# Patient Record
Sex: Female | Born: 1960 | Race: White | Hispanic: No | Smoking: Current every day smoker
Health system: Southern US, Community
[De-identification: ages and names within clinical notes are randomized; demographics above are authoritative.]

## PROBLEM LIST (undated history)

## (undated) DIAGNOSIS — Z9151 Personal history of suicidal behavior: Secondary | ICD-10-CM

## (undated) DIAGNOSIS — F319 Bipolar disorder, unspecified: Secondary | ICD-10-CM

## (undated) DIAGNOSIS — I10 Essential (primary) hypertension: Secondary | ICD-10-CM

## (undated) DIAGNOSIS — G893 Neoplasm related pain (acute) (chronic): Principal | ICD-10-CM

## (undated) DIAGNOSIS — T8859XA Other complications of anesthesia, initial encounter: Secondary | ICD-10-CM

## (undated) DIAGNOSIS — Z9889 Other specified postprocedural states: Secondary | ICD-10-CM

## (undated) DIAGNOSIS — F419 Anxiety disorder, unspecified: Secondary | ICD-10-CM

## (undated) DIAGNOSIS — T4145XA Adverse effect of unspecified anesthetic, initial encounter: Secondary | ICD-10-CM

## (undated) DIAGNOSIS — R413 Other amnesia: Secondary | ICD-10-CM

## (undated) DIAGNOSIS — Z9189 Other specified personal risk factors, not elsewhere classified: Secondary | ICD-10-CM

## (undated) DIAGNOSIS — C787 Secondary malignant neoplasm of liver and intrahepatic bile duct: Secondary | ICD-10-CM

## (undated) DIAGNOSIS — R112 Nausea with vomiting, unspecified: Secondary | ICD-10-CM

## (undated) DIAGNOSIS — N946 Dysmenorrhea, unspecified: Principal | ICD-10-CM

## (undated) DIAGNOSIS — E119 Type 2 diabetes mellitus without complications: Secondary | ICD-10-CM

## (undated) DIAGNOSIS — F329 Major depressive disorder, single episode, unspecified: Secondary | ICD-10-CM

## (undated) DIAGNOSIS — G894 Chronic pain syndrome: Secondary | ICD-10-CM

## (undated) DIAGNOSIS — F32A Depression, unspecified: Secondary | ICD-10-CM

## (undated) DIAGNOSIS — T451X5A Adverse effect of antineoplastic and immunosuppressive drugs, initial encounter: Secondary | ICD-10-CM

## (undated) DIAGNOSIS — C189 Malignant neoplasm of colon, unspecified: Secondary | ICD-10-CM

## (undated) DIAGNOSIS — D6959 Other secondary thrombocytopenia: Secondary | ICD-10-CM

## (undated) DIAGNOSIS — D701 Agranulocytosis secondary to cancer chemotherapy: Secondary | ICD-10-CM

## (undated) DIAGNOSIS — E1140 Type 2 diabetes mellitus with diabetic neuropathy, unspecified: Secondary | ICD-10-CM

## (undated) DIAGNOSIS — T50905A Adverse effect of unspecified drugs, medicaments and biological substances, initial encounter: Secondary | ICD-10-CM

## (undated) DIAGNOSIS — Z794 Long term (current) use of insulin: Secondary | ICD-10-CM

## (undated) DIAGNOSIS — K589 Irritable bowel syndrome without diarrhea: Secondary | ICD-10-CM

## (undated) DIAGNOSIS — Z915 Personal history of self-harm: Secondary | ICD-10-CM

## (undated) DIAGNOSIS — B1920 Unspecified viral hepatitis C without hepatic coma: Secondary | ICD-10-CM

## (undated) DIAGNOSIS — N1330 Unspecified hydronephrosis: Secondary | ICD-10-CM

## (undated) HISTORY — DX: Neoplasm related pain (acute) (chronic): G89.3

## (undated) HISTORY — DX: Malignant neoplasm of colon, unspecified: C18.9

## (undated) HISTORY — DX: Dysmenorrhea, unspecified: N94.6

## (undated) HISTORY — DX: Adverse effect of unspecified drugs, medicaments and biological substances, initial encounter: T50.905A

## (undated) HISTORY — DX: Adverse effect of antineoplastic and immunosuppressive drugs, initial encounter: T45.1X5A

## (undated) HISTORY — DX: Other secondary thrombocytopenia: D69.59

## (undated) HISTORY — DX: Agranulocytosis secondary to cancer chemotherapy: D70.1

## (undated) SURGERY — COLONOSCOPY
Anesthesia: Moderate Sedation

---

## 1998-01-10 ENCOUNTER — Emergency Department (HOSPITAL_COMMUNITY): Admission: EM | Admit: 1998-01-10 | Discharge: 1998-01-10 | Payer: Self-pay | Admitting: Emergency Medicine

## 1998-02-04 ENCOUNTER — Emergency Department (HOSPITAL_COMMUNITY): Admission: EM | Admit: 1998-02-04 | Discharge: 1998-02-04 | Payer: Self-pay | Admitting: Emergency Medicine

## 1999-03-16 ENCOUNTER — Encounter: Payer: Self-pay | Admitting: Rheumatology

## 1999-03-16 ENCOUNTER — Inpatient Hospital Stay: Admission: EM | Admit: 1999-03-16 | Discharge: 1999-03-17 | Payer: Self-pay | Admitting: *Deleted

## 1999-10-21 ENCOUNTER — Encounter: Payer: Self-pay | Admitting: Emergency Medicine

## 1999-10-21 ENCOUNTER — Inpatient Hospital Stay (HOSPITAL_COMMUNITY): Admission: EM | Admit: 1999-10-21 | Discharge: 1999-10-24 | Payer: Self-pay | Admitting: Emergency Medicine

## 1999-11-18 ENCOUNTER — Encounter: Admission: RE | Admit: 1999-11-18 | Discharge: 2000-02-16 | Payer: Self-pay | Admitting: Endocrinology

## 1999-11-21 ENCOUNTER — Encounter: Admission: RE | Admit: 1999-11-21 | Discharge: 1999-11-21 | Payer: Self-pay | Admitting: Obstetrics

## 1999-11-27 ENCOUNTER — Encounter: Admission: RE | Admit: 1999-11-27 | Discharge: 1999-11-27 | Payer: Self-pay | Admitting: Obstetrics & Gynecology

## 1999-12-18 ENCOUNTER — Encounter: Admission: RE | Admit: 1999-12-18 | Discharge: 1999-12-18 | Payer: Self-pay | Admitting: Obstetrics & Gynecology

## 1999-12-25 ENCOUNTER — Encounter: Admission: RE | Admit: 1999-12-25 | Discharge: 1999-12-25 | Payer: Self-pay | Admitting: Obstetrics & Gynecology

## 1999-12-27 ENCOUNTER — Inpatient Hospital Stay (HOSPITAL_COMMUNITY): Admission: AD | Admit: 1999-12-27 | Discharge: 1999-12-30 | Payer: Self-pay | Admitting: Internal Medicine

## 2000-01-03 ENCOUNTER — Encounter: Admission: RE | Admit: 2000-01-03 | Discharge: 2000-01-03 | Payer: Self-pay | Admitting: Family Medicine

## 2000-01-15 ENCOUNTER — Encounter: Admission: RE | Admit: 2000-01-15 | Discharge: 2000-01-15 | Payer: Self-pay | Admitting: Obstetrics

## 2000-01-17 ENCOUNTER — Ambulatory Visit (HOSPITAL_COMMUNITY): Admission: RE | Admit: 2000-01-17 | Discharge: 2000-01-17 | Payer: Self-pay | Admitting: Obstetrics & Gynecology

## 2000-03-25 ENCOUNTER — Encounter: Admission: RE | Admit: 2000-03-25 | Discharge: 2000-03-25 | Payer: Self-pay | Admitting: Obstetrics & Gynecology

## 2000-03-30 ENCOUNTER — Inpatient Hospital Stay (HOSPITAL_COMMUNITY): Admission: AD | Admit: 2000-03-30 | Discharge: 2000-04-01 | Payer: Self-pay | Admitting: Obstetrics & Gynecology

## 2000-03-31 ENCOUNTER — Encounter: Payer: Self-pay | Admitting: *Deleted

## 2000-04-08 ENCOUNTER — Encounter: Admission: RE | Admit: 2000-04-08 | Discharge: 2000-04-08 | Payer: Self-pay | Admitting: Obstetrics & Gynecology

## 2000-04-16 ENCOUNTER — Inpatient Hospital Stay (HOSPITAL_COMMUNITY): Admission: RE | Admit: 2000-04-16 | Discharge: 2000-04-16 | Payer: Self-pay | Admitting: Obstetrics

## 2000-04-20 ENCOUNTER — Encounter (HOSPITAL_COMMUNITY): Admission: RE | Admit: 2000-04-20 | Discharge: 2000-05-16 | Payer: Self-pay | Admitting: Obstetrics

## 2000-04-23 ENCOUNTER — Inpatient Hospital Stay (HOSPITAL_COMMUNITY): Admission: AD | Admit: 2000-04-23 | Discharge: 2000-04-23 | Payer: Self-pay | Admitting: *Deleted

## 2000-04-29 ENCOUNTER — Inpatient Hospital Stay (HOSPITAL_COMMUNITY): Admission: AD | Admit: 2000-04-29 | Discharge: 2000-05-18 | Payer: Self-pay | Admitting: Obstetrics

## 2000-04-29 ENCOUNTER — Encounter (INDEPENDENT_AMBULATORY_CARE_PROVIDER_SITE_OTHER): Payer: Self-pay

## 2000-04-30 ENCOUNTER — Encounter: Payer: Self-pay | Admitting: Obstetrics

## 2000-05-04 ENCOUNTER — Encounter: Payer: Self-pay | Admitting: Obstetrics

## 2000-05-05 ENCOUNTER — Encounter: Payer: Self-pay | Admitting: *Deleted

## 2000-05-11 ENCOUNTER — Encounter: Payer: Self-pay | Admitting: *Deleted

## 2000-05-15 ENCOUNTER — Encounter: Payer: Self-pay | Admitting: Obstetrics & Gynecology

## 2000-05-22 ENCOUNTER — Inpatient Hospital Stay (HOSPITAL_COMMUNITY): Admission: AD | Admit: 2000-05-22 | Discharge: 2000-05-22 | Payer: Self-pay | Admitting: Obstetrics & Gynecology

## 2000-07-27 ENCOUNTER — Encounter: Payer: Self-pay | Admitting: Emergency Medicine

## 2000-07-27 ENCOUNTER — Emergency Department (HOSPITAL_COMMUNITY): Admission: EM | Admit: 2000-07-27 | Discharge: 2000-07-27 | Payer: Self-pay | Admitting: Emergency Medicine

## 2000-07-28 ENCOUNTER — Emergency Department (HOSPITAL_COMMUNITY): Admission: EM | Admit: 2000-07-28 | Discharge: 2000-07-28 | Payer: Self-pay | Admitting: Emergency Medicine

## 2000-07-29 ENCOUNTER — Emergency Department (HOSPITAL_COMMUNITY): Admission: EM | Admit: 2000-07-29 | Discharge: 2000-07-29 | Payer: Self-pay

## 2001-02-11 ENCOUNTER — Emergency Department (HOSPITAL_COMMUNITY): Admission: EM | Admit: 2001-02-11 | Discharge: 2001-02-11 | Payer: Self-pay | Admitting: Internal Medicine

## 2001-02-13 ENCOUNTER — Emergency Department (HOSPITAL_COMMUNITY): Admission: EM | Admit: 2001-02-13 | Discharge: 2001-02-13 | Payer: Self-pay

## 2001-07-07 ENCOUNTER — Inpatient Hospital Stay (HOSPITAL_COMMUNITY): Admission: EM | Admit: 2001-07-07 | Discharge: 2001-07-09 | Payer: Self-pay | Admitting: Emergency Medicine

## 2001-07-15 ENCOUNTER — Encounter (HOSPITAL_COMMUNITY): Admission: RE | Admit: 2001-07-15 | Discharge: 2001-10-13 | Payer: Self-pay | Admitting: *Deleted

## 2001-09-12 ENCOUNTER — Emergency Department (HOSPITAL_COMMUNITY): Admission: EM | Admit: 2001-09-12 | Discharge: 2001-09-12 | Payer: Self-pay | Admitting: Emergency Medicine

## 2002-12-12 ENCOUNTER — Emergency Department (HOSPITAL_COMMUNITY): Admission: EM | Admit: 2002-12-12 | Discharge: 2002-12-13 | Payer: Self-pay | Admitting: Emergency Medicine

## 2003-05-12 ENCOUNTER — Encounter: Payer: Self-pay | Admitting: Internal Medicine

## 2003-05-12 ENCOUNTER — Emergency Department (HOSPITAL_COMMUNITY): Admission: EM | Admit: 2003-05-12 | Discharge: 2003-05-12 | Payer: Self-pay | Admitting: Internal Medicine

## 2003-06-03 ENCOUNTER — Emergency Department (HOSPITAL_COMMUNITY): Admission: EM | Admit: 2003-06-03 | Discharge: 2003-06-03 | Payer: Self-pay | Admitting: Emergency Medicine

## 2003-06-04 ENCOUNTER — Emergency Department (HOSPITAL_COMMUNITY): Admission: EM | Admit: 2003-06-04 | Discharge: 2003-06-04 | Payer: Self-pay | Admitting: Emergency Medicine

## 2003-06-08 ENCOUNTER — Emergency Department (HOSPITAL_COMMUNITY): Admission: EM | Admit: 2003-06-08 | Discharge: 2003-06-08 | Payer: Self-pay | Admitting: Emergency Medicine

## 2004-01-20 ENCOUNTER — Emergency Department (HOSPITAL_COMMUNITY): Admission: EM | Admit: 2004-01-20 | Discharge: 2004-01-20 | Payer: Self-pay | Admitting: Diagnostic Radiology

## 2004-02-01 ENCOUNTER — Encounter: Admission: RE | Admit: 2004-02-01 | Discharge: 2004-02-01 | Payer: Self-pay | Admitting: Internal Medicine

## 2004-02-08 ENCOUNTER — Inpatient Hospital Stay (HOSPITAL_COMMUNITY): Admission: EM | Admit: 2004-02-08 | Discharge: 2004-02-09 | Payer: Self-pay | Admitting: Emergency Medicine

## 2004-03-15 ENCOUNTER — Ambulatory Visit (HOSPITAL_COMMUNITY): Admission: RE | Admit: 2004-03-15 | Discharge: 2004-03-15 | Payer: Self-pay | Admitting: Internal Medicine

## 2004-04-18 ENCOUNTER — Ambulatory Visit: Payer: Self-pay | Admitting: Family Medicine

## 2004-05-16 ENCOUNTER — Ambulatory Visit: Payer: Self-pay | Admitting: Family Medicine

## 2004-06-17 ENCOUNTER — Emergency Department (HOSPITAL_COMMUNITY): Admission: EM | Admit: 2004-06-17 | Discharge: 2004-06-17 | Payer: Self-pay | Admitting: Emergency Medicine

## 2004-07-09 ENCOUNTER — Ambulatory Visit: Payer: Self-pay | Admitting: Family Medicine

## 2004-09-03 ENCOUNTER — Ambulatory Visit: Payer: Self-pay | Admitting: Family Medicine

## 2004-09-10 ENCOUNTER — Ambulatory Visit: Payer: Self-pay | Admitting: Family Medicine

## 2004-10-08 ENCOUNTER — Ambulatory Visit: Payer: Self-pay | Admitting: Family Medicine

## 2005-02-11 ENCOUNTER — Ambulatory Visit: Payer: Self-pay | Admitting: Family Medicine

## 2005-04-20 ENCOUNTER — Emergency Department (HOSPITAL_COMMUNITY): Admission: EM | Admit: 2005-04-20 | Discharge: 2005-04-20 | Payer: Self-pay | Admitting: Emergency Medicine

## 2005-04-29 ENCOUNTER — Ambulatory Visit: Payer: Self-pay | Admitting: Family Medicine

## 2005-06-24 ENCOUNTER — Ambulatory Visit: Payer: Self-pay | Admitting: Family Medicine

## 2005-07-04 ENCOUNTER — Emergency Department (HOSPITAL_COMMUNITY): Admission: EM | Admit: 2005-07-04 | Discharge: 2005-07-04 | Payer: Self-pay | Admitting: Emergency Medicine

## 2005-08-25 ENCOUNTER — Ambulatory Visit: Payer: Self-pay | Admitting: Family Medicine

## 2005-09-03 ENCOUNTER — Inpatient Hospital Stay (HOSPITAL_COMMUNITY): Admission: EM | Admit: 2005-09-03 | Discharge: 2005-09-12 | Payer: Self-pay | Admitting: Psychiatry

## 2005-09-04 ENCOUNTER — Ambulatory Visit: Payer: Self-pay | Admitting: Psychiatry

## 2005-09-21 ENCOUNTER — Emergency Department (HOSPITAL_COMMUNITY): Admission: EM | Admit: 2005-09-21 | Discharge: 2005-09-21 | Payer: Self-pay | Admitting: Emergency Medicine

## 2005-09-23 ENCOUNTER — Ambulatory Visit: Payer: Self-pay | Admitting: Family Medicine

## 2005-09-23 ENCOUNTER — Inpatient Hospital Stay (HOSPITAL_COMMUNITY): Admission: EM | Admit: 2005-09-23 | Discharge: 2005-09-25 | Payer: Self-pay | Admitting: *Deleted

## 2005-09-25 ENCOUNTER — Inpatient Hospital Stay (HOSPITAL_COMMUNITY): Admission: RE | Admit: 2005-09-25 | Discharge: 2005-10-02 | Payer: Self-pay | Admitting: Psychiatry

## 2005-10-03 ENCOUNTER — Ambulatory Visit: Payer: Self-pay | Admitting: Family Medicine

## 2005-10-08 ENCOUNTER — Ambulatory Visit (HOSPITAL_COMMUNITY): Admission: RE | Admit: 2005-10-08 | Discharge: 2005-10-08 | Payer: Self-pay | Admitting: *Deleted

## 2005-10-08 ENCOUNTER — Encounter: Payer: Self-pay | Admitting: Cardiology

## 2005-10-08 ENCOUNTER — Ambulatory Visit: Payer: Self-pay | Admitting: Cardiology

## 2005-10-17 ENCOUNTER — Emergency Department (HOSPITAL_COMMUNITY): Admission: EM | Admit: 2005-10-17 | Discharge: 2005-10-17 | Payer: Self-pay | Admitting: Emergency Medicine

## 2005-10-21 ENCOUNTER — Ambulatory Visit: Payer: Self-pay | Admitting: Family Medicine

## 2005-12-09 ENCOUNTER — Ambulatory Visit: Payer: Self-pay | Admitting: Family Medicine

## 2005-12-19 ENCOUNTER — Emergency Department (HOSPITAL_COMMUNITY): Admission: EM | Admit: 2005-12-19 | Discharge: 2005-12-19 | Payer: Self-pay | Admitting: Emergency Medicine

## 2005-12-23 ENCOUNTER — Ambulatory Visit: Payer: Self-pay | Admitting: Family Medicine

## 2006-01-08 ENCOUNTER — Ambulatory Visit: Payer: Self-pay | Admitting: Internal Medicine

## 2007-07-31 ENCOUNTER — Emergency Department (HOSPITAL_COMMUNITY): Admission: EM | Admit: 2007-07-31 | Discharge: 2007-07-31 | Payer: Self-pay | Admitting: Emergency Medicine

## 2007-08-23 ENCOUNTER — Inpatient Hospital Stay (HOSPITAL_COMMUNITY): Admission: RE | Admit: 2007-08-23 | Discharge: 2007-08-24 | Payer: Self-pay | Admitting: Psychiatry

## 2007-08-23 ENCOUNTER — Ambulatory Visit: Payer: Self-pay | Admitting: Psychiatry

## 2007-09-15 ENCOUNTER — Emergency Department (HOSPITAL_COMMUNITY): Admission: EM | Admit: 2007-09-15 | Discharge: 2007-09-15 | Payer: Self-pay | Admitting: Emergency Medicine

## 2007-09-19 ENCOUNTER — Emergency Department (HOSPITAL_COMMUNITY): Admission: EM | Admit: 2007-09-19 | Discharge: 2007-09-19 | Payer: Self-pay | Admitting: Emergency Medicine

## 2007-11-07 ENCOUNTER — Emergency Department (HOSPITAL_COMMUNITY): Admission: EM | Admit: 2007-11-07 | Discharge: 2007-11-07 | Payer: Self-pay | Admitting: Emergency Medicine

## 2007-11-25 ENCOUNTER — Emergency Department (HOSPITAL_COMMUNITY): Admission: EM | Admit: 2007-11-25 | Discharge: 2007-11-25 | Payer: Self-pay | Admitting: Emergency Medicine

## 2007-12-06 ENCOUNTER — Ambulatory Visit: Payer: Self-pay | Admitting: Internal Medicine

## 2007-12-06 ENCOUNTER — Encounter (INDEPENDENT_AMBULATORY_CARE_PROVIDER_SITE_OTHER): Payer: Self-pay | Admitting: Family Medicine

## 2007-12-06 LAB — CONVERTED CEMR LAB
ALT: 16 units/L (ref 0–35)
Albumin: 4.3 g/dL (ref 3.5–5.2)
Alkaline Phosphatase: 57 units/L (ref 39–117)
Amphetamine Screen, Ur: NEGATIVE
BUN: 10 mg/dL (ref 6–23)
Basophils Absolute: 0 10*3/uL (ref 0.0–0.1)
Benzodiazepines.: NEGATIVE
CO2: 28 meq/L (ref 19–32)
Chloride: 103 meq/L (ref 96–112)
Cholesterol: 172 mg/dL (ref 0–200)
Creatinine, Ser: 1.05 mg/dL (ref 0.40–1.20)
Eosinophils Absolute: 0.1 10*3/uL (ref 0.0–0.7)
Eosinophils Relative: 2 % (ref 0–5)
HCT: 37.7 % (ref 36.0–46.0)
Lymphocytes Relative: 39 % (ref 12–46)
MCV: 81.6 fL (ref 78.0–100.0)
Microalb, Ur: 0.2 mg/dL (ref 0.00–1.89)
Monocytes Absolute: 0.4 10*3/uL (ref 0.1–1.0)
Monocytes Relative: 9 % (ref 3–12)
Opiate Screen, Urine: NEGATIVE
Platelets: 183 10*3/uL (ref 150–400)
Potassium: 3.4 meq/L — ABNORMAL LOW (ref 3.5–5.3)
Propoxyphene: NEGATIVE
RBC: 4.62 M/uL (ref 3.87–5.11)
RDW: 14.7 % (ref 11.5–15.5)
Sodium: 143 meq/L (ref 135–145)
TSH: 1.524 microintl units/mL (ref 0.350–5.50)
Total Protein: 7 g/dL (ref 6.0–8.3)
Triglycerides: 79 mg/dL (ref <150)

## 2008-01-24 ENCOUNTER — Inpatient Hospital Stay (HOSPITAL_COMMUNITY): Admission: AD | Admit: 2008-01-24 | Discharge: 2008-01-28 | Payer: Self-pay | Admitting: Psychiatry

## 2008-01-24 ENCOUNTER — Ambulatory Visit: Payer: Self-pay | Admitting: Psychiatry

## 2008-01-24 ENCOUNTER — Encounter: Payer: Self-pay | Admitting: Emergency Medicine

## 2008-02-21 ENCOUNTER — Ambulatory Visit: Payer: Self-pay | Admitting: Internal Medicine

## 2008-03-09 ENCOUNTER — Telehealth (INDEPENDENT_AMBULATORY_CARE_PROVIDER_SITE_OTHER): Payer: Self-pay | Admitting: Nurse Practitioner

## 2008-03-14 ENCOUNTER — Inpatient Hospital Stay (HOSPITAL_COMMUNITY): Admission: EM | Admit: 2008-03-14 | Discharge: 2008-03-16 | Payer: Self-pay | Admitting: Emergency Medicine

## 2008-03-14 DIAGNOSIS — F1120 Opioid dependence, uncomplicated: Secondary | ICD-10-CM | POA: Insufficient documentation

## 2008-04-04 ENCOUNTER — Ambulatory Visit: Payer: Self-pay | Admitting: Nurse Practitioner

## 2008-04-04 DIAGNOSIS — B1710 Acute hepatitis C without hepatic coma: Secondary | ICD-10-CM

## 2008-04-04 DIAGNOSIS — B009 Herpesviral infection, unspecified: Secondary | ICD-10-CM | POA: Insufficient documentation

## 2008-04-04 DIAGNOSIS — F319 Bipolar disorder, unspecified: Secondary | ICD-10-CM

## 2008-04-04 DIAGNOSIS — F1910 Other psychoactive substance abuse, uncomplicated: Secondary | ICD-10-CM

## 2008-04-04 DIAGNOSIS — D539 Nutritional anemia, unspecified: Secondary | ICD-10-CM | POA: Insufficient documentation

## 2008-04-04 DIAGNOSIS — R300 Dysuria: Secondary | ICD-10-CM

## 2008-04-04 DIAGNOSIS — I10 Essential (primary) hypertension: Secondary | ICD-10-CM

## 2008-04-04 DIAGNOSIS — F39 Unspecified mood [affective] disorder: Secondary | ICD-10-CM | POA: Insufficient documentation

## 2008-04-04 DIAGNOSIS — F17200 Nicotine dependence, unspecified, uncomplicated: Secondary | ICD-10-CM

## 2008-04-04 LAB — CONVERTED CEMR LAB
Bilirubin Urine: NEGATIVE
Blood Glucose, Fingerstick: 203
Creatinine,U: 112.8 mg/dL
Glucose, Urine, Semiquant: NEGATIVE
Propoxyphene: NEGATIVE
Protein, U semiquant: NEGATIVE
Urobilinogen, UA: 0.2

## 2008-04-05 ENCOUNTER — Encounter (INDEPENDENT_AMBULATORY_CARE_PROVIDER_SITE_OTHER): Payer: Self-pay | Admitting: Nurse Practitioner

## 2008-04-05 LAB — CONVERTED CEMR LAB
BUN: 18 mg/dL (ref 6–23)
Basophils Absolute: 0 10*3/uL (ref 0.0–0.1)
CO2: 24 meq/L (ref 19–32)
Calcium: 8.8 mg/dL (ref 8.4–10.5)
Chloride: 105 meq/L (ref 96–112)
Eosinophils Absolute: 0 10*3/uL (ref 0.0–0.7)
Glucose, Bld: 187 mg/dL — ABNORMAL HIGH (ref 70–99)
HCT: 40.4 % (ref 36.0–46.0)
HDL: 80 mg/dL (ref >39)
Hemoglobin: 13 g/dL (ref 12.0–15.0)
Lymphs Abs: 1.5 10*3/uL (ref 0.7–4.0)
MCHC: 32.2 g/dL (ref 30.0–36.0)
MCV: 85.1 fL (ref 78.0–100.0)
Microalb, Ur: 1.27 mg/dL (ref 0.00–1.89)
Monocytes Absolute: 0.3 10*3/uL (ref 0.1–1.0)
Monocytes Relative: 4 % (ref 3–12)
Neutro Abs: 3.9 10*3/uL (ref 1.7–7.7)
Neutrophils Relative %: 68 % (ref 43–77)
Platelets: 200 10*3/uL (ref 150–400)
Potassium: 4.3 meq/L (ref 3.5–5.3)
Sodium: 139 meq/L (ref 135–145)
Total Bilirubin: 0.3 mg/dL (ref 0.3–1.2)
WBC: 5.7 10*3/uL (ref 4.0–10.5)

## 2008-04-06 ENCOUNTER — Telehealth (INDEPENDENT_AMBULATORY_CARE_PROVIDER_SITE_OTHER): Payer: Self-pay | Admitting: Nurse Practitioner

## 2008-04-07 DIAGNOSIS — F489 Nonpsychotic mental disorder, unspecified: Secondary | ICD-10-CM | POA: Insufficient documentation

## 2008-04-07 DIAGNOSIS — E109 Type 1 diabetes mellitus without complications: Secondary | ICD-10-CM

## 2008-05-24 ENCOUNTER — Telehealth (INDEPENDENT_AMBULATORY_CARE_PROVIDER_SITE_OTHER): Payer: Self-pay | Admitting: Nurse Practitioner

## 2008-06-14 ENCOUNTER — Ambulatory Visit: Payer: Self-pay | Admitting: Nurse Practitioner

## 2008-06-14 DIAGNOSIS — L98499 Non-pressure chronic ulcer of skin of other sites with unspecified severity: Secondary | ICD-10-CM | POA: Insufficient documentation

## 2008-06-14 DIAGNOSIS — J069 Acute upper respiratory infection, unspecified: Secondary | ICD-10-CM | POA: Insufficient documentation

## 2008-06-14 LAB — CONVERTED CEMR LAB: Hgb A1c MFr Bld: 7.3 %

## 2008-06-15 ENCOUNTER — Telehealth (INDEPENDENT_AMBULATORY_CARE_PROVIDER_SITE_OTHER): Payer: Self-pay | Admitting: Nurse Practitioner

## 2008-06-16 ENCOUNTER — Emergency Department (HOSPITAL_COMMUNITY): Admission: EM | Admit: 2008-06-16 | Discharge: 2008-06-16 | Payer: Self-pay | Admitting: Emergency Medicine

## 2008-06-23 ENCOUNTER — Encounter (INDEPENDENT_AMBULATORY_CARE_PROVIDER_SITE_OTHER): Payer: Self-pay | Admitting: Nurse Practitioner

## 2008-07-03 ENCOUNTER — Telehealth (INDEPENDENT_AMBULATORY_CARE_PROVIDER_SITE_OTHER): Payer: Self-pay | Admitting: Nurse Practitioner

## 2008-07-10 ENCOUNTER — Encounter (INDEPENDENT_AMBULATORY_CARE_PROVIDER_SITE_OTHER): Payer: Self-pay | Admitting: Nurse Practitioner

## 2008-07-14 ENCOUNTER — Encounter (INDEPENDENT_AMBULATORY_CARE_PROVIDER_SITE_OTHER): Payer: Self-pay | Admitting: Nurse Practitioner

## 2008-08-15 ENCOUNTER — Ambulatory Visit: Payer: Self-pay | Admitting: Nurse Practitioner

## 2008-08-15 DIAGNOSIS — R609 Edema, unspecified: Secondary | ICD-10-CM | POA: Insufficient documentation

## 2008-08-15 LAB — CONVERTED CEMR LAB: Blood Glucose, Fingerstick: 42

## 2008-08-16 LAB — CONVERTED CEMR LAB: Glucose, Bld: 130 mg/dL — ABNORMAL HIGH (ref 70–99)

## 2008-08-23 ENCOUNTER — Encounter (INDEPENDENT_AMBULATORY_CARE_PROVIDER_SITE_OTHER): Payer: Self-pay | Admitting: Nurse Practitioner

## 2008-10-11 ENCOUNTER — Ambulatory Visit: Payer: Self-pay | Admitting: Nurse Practitioner

## 2008-10-11 DIAGNOSIS — M25579 Pain in unspecified ankle and joints of unspecified foot: Secondary | ICD-10-CM

## 2008-10-11 DIAGNOSIS — M25569 Pain in unspecified knee: Secondary | ICD-10-CM

## 2008-10-11 LAB — CONVERTED CEMR LAB: Hgb A1c MFr Bld: 6.2 %

## 2008-10-17 ENCOUNTER — Telehealth (INDEPENDENT_AMBULATORY_CARE_PROVIDER_SITE_OTHER): Payer: Self-pay | Admitting: Nurse Practitioner

## 2008-11-08 ENCOUNTER — Telehealth (INDEPENDENT_AMBULATORY_CARE_PROVIDER_SITE_OTHER): Payer: Self-pay | Admitting: Nurse Practitioner

## 2008-11-13 ENCOUNTER — Ambulatory Visit: Payer: Self-pay | Admitting: Nurse Practitioner

## 2008-11-13 LAB — CONVERTED CEMR LAB: Blood Glucose, Fingerstick: 242

## 2008-11-14 ENCOUNTER — Ambulatory Visit: Payer: Self-pay | Admitting: Nurse Practitioner

## 2008-11-14 LAB — CONVERTED CEMR LAB
HCT: 39.7 % (ref 36.0–46.0)
HCV Quantitative: 12700000 intl units/mL — ABNORMAL HIGH (ref <43)
Lymphocytes Relative: 23 % (ref 12–46)
MCHC: 33.5 g/dL (ref 30.0–36.0)
MCV: 88.2 fL (ref 78.0–100.0)
Monocytes Absolute: 0.5 10*3/uL (ref 0.1–1.0)
Monocytes Relative: 9 % (ref 3–12)
Neutrophils Relative %: 67 % (ref 43–77)

## 2008-11-16 ENCOUNTER — Telehealth (INDEPENDENT_AMBULATORY_CARE_PROVIDER_SITE_OTHER): Payer: Self-pay | Admitting: Nurse Practitioner

## 2008-11-21 ENCOUNTER — Ambulatory Visit (HOSPITAL_COMMUNITY): Admission: RE | Admit: 2008-11-21 | Discharge: 2008-11-21 | Payer: Self-pay | Admitting: Nurse Practitioner

## 2008-11-24 ENCOUNTER — Encounter (INDEPENDENT_AMBULATORY_CARE_PROVIDER_SITE_OTHER): Payer: Self-pay | Admitting: Nurse Practitioner

## 2008-12-05 ENCOUNTER — Encounter (INDEPENDENT_AMBULATORY_CARE_PROVIDER_SITE_OTHER): Payer: Self-pay | Admitting: Nurse Practitioner

## 2008-12-07 ENCOUNTER — Ambulatory Visit: Payer: Self-pay | Admitting: Gastroenterology

## 2008-12-29 ENCOUNTER — Ambulatory Visit: Payer: Self-pay | Admitting: Nurse Practitioner

## 2008-12-29 DIAGNOSIS — R0602 Shortness of breath: Secondary | ICD-10-CM

## 2009-01-10 ENCOUNTER — Encounter (INDEPENDENT_AMBULATORY_CARE_PROVIDER_SITE_OTHER): Payer: Self-pay | Admitting: Ophthalmology

## 2009-01-10 ENCOUNTER — Ambulatory Visit (HOSPITAL_COMMUNITY): Admission: RE | Admit: 2009-01-10 | Discharge: 2009-01-10 | Payer: Self-pay | Admitting: Family Medicine

## 2009-01-10 ENCOUNTER — Ambulatory Visit: Payer: Self-pay | Admitting: Internal Medicine

## 2009-02-25 ENCOUNTER — Telehealth (INDEPENDENT_AMBULATORY_CARE_PROVIDER_SITE_OTHER): Payer: Self-pay | Admitting: Nurse Practitioner

## 2009-02-26 ENCOUNTER — Ambulatory Visit: Payer: Self-pay | Admitting: Nurse Practitioner

## 2009-02-26 LAB — CONVERTED CEMR LAB
Bilirubin Urine: NEGATIVE
Blood Glucose, Fingerstick: 116
Hgb A1c MFr Bld: 6.1 %
Ketones, urine, test strip: NEGATIVE
Nitrite: NEGATIVE
Protein, U semiquant: NEGATIVE
WBC Urine, dipstick: NEGATIVE

## 2009-02-27 LAB — CONVERTED CEMR LAB
ALT: 18 units/L (ref 0–35)
AST: 22 units/L (ref 0–37)
Albumin: 4.2 g/dL (ref 3.5–5.2)
Alkaline Phosphatase: 79 units/L (ref 39–117)
BUN: 52 mg/dL — ABNORMAL HIGH (ref 6–23)
CO2: 30 meq/L (ref 19–32)
Calcium: 9.8 mg/dL (ref 8.4–10.5)
Chloride: 93 meq/L — ABNORMAL LOW (ref 96–112)
Creatinine, Ser: 2.2 mg/dL — ABNORMAL HIGH (ref 0.40–1.20)
Glucose, Bld: 66 mg/dL — ABNORMAL LOW (ref 70–99)
Potassium: 4.8 meq/L (ref 3.5–5.3)
Sodium: 139 meq/L (ref 135–145)
Total Bilirubin: 0.4 mg/dL (ref 0.3–1.2)
Total Protein: 7.1 g/dL (ref 6.0–8.3)

## 2009-03-02 ENCOUNTER — Telehealth (INDEPENDENT_AMBULATORY_CARE_PROVIDER_SITE_OTHER): Payer: Self-pay | Admitting: Nurse Practitioner

## 2009-03-30 ENCOUNTER — Ambulatory Visit: Payer: Self-pay | Admitting: Nurse Practitioner

## 2009-03-30 DIAGNOSIS — N183 Chronic kidney disease, stage 3 (moderate): Secondary | ICD-10-CM

## 2009-03-30 DIAGNOSIS — L989 Disorder of the skin and subcutaneous tissue, unspecified: Secondary | ICD-10-CM | POA: Insufficient documentation

## 2009-04-10 ENCOUNTER — Ambulatory Visit: Payer: Self-pay | Admitting: Gastroenterology

## 2009-04-10 ENCOUNTER — Encounter (INDEPENDENT_AMBULATORY_CARE_PROVIDER_SITE_OTHER): Payer: Self-pay | Admitting: Nurse Practitioner

## 2009-04-19 ENCOUNTER — Ambulatory Visit (HOSPITAL_COMMUNITY): Admission: RE | Admit: 2009-04-19 | Discharge: 2009-04-19 | Payer: Self-pay | Admitting: Nephrology

## 2009-04-21 ENCOUNTER — Encounter (INDEPENDENT_AMBULATORY_CARE_PROVIDER_SITE_OTHER): Payer: Self-pay | Admitting: Nurse Practitioner

## 2009-04-23 ENCOUNTER — Encounter: Admission: RE | Admit: 2009-04-23 | Discharge: 2009-04-23 | Payer: Self-pay | Admitting: Nephrology

## 2009-04-24 ENCOUNTER — Encounter (INDEPENDENT_AMBULATORY_CARE_PROVIDER_SITE_OTHER): Payer: Self-pay | Admitting: Nurse Practitioner

## 2009-05-08 ENCOUNTER — Encounter (INDEPENDENT_AMBULATORY_CARE_PROVIDER_SITE_OTHER): Payer: Self-pay | Admitting: Nurse Practitioner

## 2009-05-14 ENCOUNTER — Ambulatory Visit: Payer: Self-pay | Admitting: Nurse Practitioner

## 2009-05-14 LAB — CONVERTED CEMR LAB: Blood Glucose, Fingerstick: 253

## 2009-05-18 ENCOUNTER — Observation Stay (HOSPITAL_COMMUNITY): Admission: EM | Admit: 2009-05-18 | Discharge: 2009-05-18 | Payer: Self-pay | Admitting: Emergency Medicine

## 2009-06-11 ENCOUNTER — Encounter (INDEPENDENT_AMBULATORY_CARE_PROVIDER_SITE_OTHER): Payer: Self-pay | Admitting: Nurse Practitioner

## 2009-06-11 ENCOUNTER — Telehealth (INDEPENDENT_AMBULATORY_CARE_PROVIDER_SITE_OTHER): Payer: Self-pay | Admitting: Nurse Practitioner

## 2009-06-21 ENCOUNTER — Ambulatory Visit (HOSPITAL_COMMUNITY): Admission: RE | Admit: 2009-06-21 | Discharge: 2009-06-21 | Payer: Self-pay | Admitting: Gastroenterology

## 2009-06-22 ENCOUNTER — Ambulatory Visit: Payer: Self-pay | Admitting: Nurse Practitioner

## 2009-06-22 DIAGNOSIS — E663 Overweight: Secondary | ICD-10-CM

## 2009-07-11 ENCOUNTER — Telehealth (INDEPENDENT_AMBULATORY_CARE_PROVIDER_SITE_OTHER): Payer: Self-pay | Admitting: Nurse Practitioner

## 2009-08-06 ENCOUNTER — Ambulatory Visit: Payer: Self-pay | Admitting: Nurse Practitioner

## 2009-08-06 LAB — CONVERTED CEMR LAB: Blood Glucose, Fingerstick: 109

## 2009-09-18 ENCOUNTER — Ambulatory Visit: Payer: Self-pay | Admitting: Nurse Practitioner

## 2009-09-24 LAB — CONVERTED CEMR LAB
AST: 24 units/L (ref 0–37)
BUN: 12 mg/dL (ref 6–23)
Basophils Absolute: 0 10*3/uL (ref 0.0–0.1)
CO2: 30 meq/L (ref 19–32)
Calcium: 8.9 mg/dL (ref 8.4–10.5)
Cholesterol: 271 mg/dL — ABNORMAL HIGH (ref 0–200)
Eosinophils Absolute: 0.1 10*3/uL (ref 0.0–0.7)
Eosinophils Relative: 2 % (ref 0–5)
Glucose, Bld: 206 mg/dL — ABNORMAL HIGH (ref 70–99)
HDL: 51 mg/dL (ref >39)
LDL Cholesterol: 192 mg/dL — ABNORMAL HIGH (ref 0–99)
Lymphs Abs: 1.3 10*3/uL (ref 0.7–4.0)
Platelets: 217 10*3/uL (ref 150–400)
Potassium: 3.8 meq/L (ref 3.5–5.3)
RDW: 15 % (ref 11.5–15.5)
Sodium: 140 meq/L (ref 135–145)
Total Bilirubin: 0.4 mg/dL (ref 0.3–1.2)
Total CHOL/HDL Ratio: 5.3
Triglycerides: 142 mg/dL (ref <150)

## 2009-09-28 ENCOUNTER — Encounter (INDEPENDENT_AMBULATORY_CARE_PROVIDER_SITE_OTHER): Payer: Self-pay | Admitting: Nurse Practitioner

## 2009-10-02 ENCOUNTER — Telehealth (INDEPENDENT_AMBULATORY_CARE_PROVIDER_SITE_OTHER): Payer: Self-pay | Admitting: Nurse Practitioner

## 2009-10-11 ENCOUNTER — Other Ambulatory Visit: Admission: RE | Admit: 2009-10-11 | Discharge: 2009-10-11 | Payer: Self-pay | Admitting: Internal Medicine

## 2009-10-11 ENCOUNTER — Ambulatory Visit: Payer: Self-pay | Admitting: Nurse Practitioner

## 2009-10-11 LAB — CONVERTED CEMR LAB
Blood in Urine, dipstick: NEGATIVE
Chlamydia, DNA Probe: NEGATIVE
Glucose, Urine, Semiquant: >=1000
KOH Prep: NEGATIVE
Nitrite: NEGATIVE
OCCULT 1: NEGATIVE
WBC Urine, dipstick: NEGATIVE

## 2009-10-16 ENCOUNTER — Encounter (INDEPENDENT_AMBULATORY_CARE_PROVIDER_SITE_OTHER): Payer: Self-pay | Admitting: Nurse Practitioner

## 2009-10-17 ENCOUNTER — Encounter (INDEPENDENT_AMBULATORY_CARE_PROVIDER_SITE_OTHER): Payer: Self-pay | Admitting: Nurse Practitioner

## 2009-10-22 ENCOUNTER — Telehealth (INDEPENDENT_AMBULATORY_CARE_PROVIDER_SITE_OTHER): Payer: Self-pay | Admitting: Nurse Practitioner

## 2009-10-24 ENCOUNTER — Telehealth (INDEPENDENT_AMBULATORY_CARE_PROVIDER_SITE_OTHER): Payer: Self-pay | Admitting: Nurse Practitioner

## 2009-10-25 ENCOUNTER — Encounter: Admission: RE | Admit: 2009-10-25 | Discharge: 2009-10-25 | Payer: Self-pay | Admitting: Nurse Practitioner

## 2009-10-25 ENCOUNTER — Encounter (INDEPENDENT_AMBULATORY_CARE_PROVIDER_SITE_OTHER): Payer: Self-pay | Admitting: Nurse Practitioner

## 2009-11-19 ENCOUNTER — Encounter (INDEPENDENT_AMBULATORY_CARE_PROVIDER_SITE_OTHER): Payer: Self-pay | Admitting: Nurse Practitioner

## 2009-11-30 ENCOUNTER — Encounter (INDEPENDENT_AMBULATORY_CARE_PROVIDER_SITE_OTHER): Payer: Self-pay | Admitting: Nurse Practitioner

## 2009-12-10 ENCOUNTER — Telehealth (INDEPENDENT_AMBULATORY_CARE_PROVIDER_SITE_OTHER): Payer: Self-pay | Admitting: Nurse Practitioner

## 2009-12-20 ENCOUNTER — Ambulatory Visit: Payer: Self-pay | Admitting: Nurse Practitioner

## 2009-12-20 DIAGNOSIS — M79609 Pain in unspecified limb: Secondary | ICD-10-CM | POA: Insufficient documentation

## 2010-01-08 ENCOUNTER — Telehealth (INDEPENDENT_AMBULATORY_CARE_PROVIDER_SITE_OTHER): Payer: Self-pay | Admitting: Nurse Practitioner

## 2010-01-23 ENCOUNTER — Telehealth (INDEPENDENT_AMBULATORY_CARE_PROVIDER_SITE_OTHER): Payer: Self-pay | Admitting: Nurse Practitioner

## 2010-02-08 ENCOUNTER — Telehealth (INDEPENDENT_AMBULATORY_CARE_PROVIDER_SITE_OTHER): Payer: Self-pay | Admitting: Nurse Practitioner

## 2010-02-28 ENCOUNTER — Telehealth (INDEPENDENT_AMBULATORY_CARE_PROVIDER_SITE_OTHER): Payer: Self-pay | Admitting: Nurse Practitioner

## 2010-03-05 ENCOUNTER — Encounter (INDEPENDENT_AMBULATORY_CARE_PROVIDER_SITE_OTHER): Payer: Self-pay | Admitting: Nurse Practitioner

## 2010-03-22 ENCOUNTER — Ambulatory Visit: Payer: Self-pay | Admitting: Nurse Practitioner

## 2010-03-22 LAB — CONVERTED CEMR LAB: Hgb A1c MFr Bld: 7.1 %

## 2010-04-04 ENCOUNTER — Telehealth (INDEPENDENT_AMBULATORY_CARE_PROVIDER_SITE_OTHER): Payer: Self-pay | Admitting: Nurse Practitioner

## 2010-04-11 ENCOUNTER — Telehealth (INDEPENDENT_AMBULATORY_CARE_PROVIDER_SITE_OTHER): Payer: Self-pay | Admitting: Nurse Practitioner

## 2010-04-15 ENCOUNTER — Emergency Department (HOSPITAL_COMMUNITY): Admission: EM | Admit: 2010-04-15 | Discharge: 2010-04-16 | Payer: Self-pay | Admitting: Emergency Medicine

## 2010-04-15 ENCOUNTER — Telehealth (INDEPENDENT_AMBULATORY_CARE_PROVIDER_SITE_OTHER): Payer: Self-pay | Admitting: Nurse Practitioner

## 2010-04-16 ENCOUNTER — Telehealth (INDEPENDENT_AMBULATORY_CARE_PROVIDER_SITE_OTHER): Payer: Self-pay | Admitting: Nurse Practitioner

## 2010-05-28 ENCOUNTER — Encounter (INDEPENDENT_AMBULATORY_CARE_PROVIDER_SITE_OTHER): Payer: Self-pay | Admitting: Nurse Practitioner

## 2010-06-06 ENCOUNTER — Telehealth (INDEPENDENT_AMBULATORY_CARE_PROVIDER_SITE_OTHER): Payer: Self-pay | Admitting: Nurse Practitioner

## 2010-06-06 ENCOUNTER — Emergency Department (HOSPITAL_COMMUNITY): Admission: EM | Admit: 2010-06-06 | Discharge: 2010-06-06 | Payer: Self-pay | Admitting: Emergency Medicine

## 2010-06-13 ENCOUNTER — Encounter (INDEPENDENT_AMBULATORY_CARE_PROVIDER_SITE_OTHER): Payer: Self-pay | Admitting: Nurse Practitioner

## 2010-06-20 ENCOUNTER — Ambulatory Visit: Payer: Self-pay | Admitting: Nurse Practitioner

## 2010-06-20 DIAGNOSIS — J329 Chronic sinusitis, unspecified: Secondary | ICD-10-CM | POA: Insufficient documentation

## 2010-06-20 LAB — CONVERTED CEMR LAB
Bilirubin Urine: NEGATIVE
Blood Glucose, Fingerstick: 120
Blood in Urine, dipstick: NEGATIVE
Glucose, Urine, Semiquant: NEGATIVE
Hgb A1c MFr Bld: 6.5 %
Protein, U semiquant: NEGATIVE
Specific Gravity, Urine: 1.02
WBC Urine, dipstick: NEGATIVE

## 2010-06-24 ENCOUNTER — Telehealth (INDEPENDENT_AMBULATORY_CARE_PROVIDER_SITE_OTHER): Payer: Self-pay | Admitting: Nurse Practitioner

## 2010-06-25 ENCOUNTER — Telehealth (INDEPENDENT_AMBULATORY_CARE_PROVIDER_SITE_OTHER): Payer: Self-pay | Admitting: Nurse Practitioner

## 2010-07-02 ENCOUNTER — Telehealth (INDEPENDENT_AMBULATORY_CARE_PROVIDER_SITE_OTHER): Payer: Self-pay | Admitting: Nurse Practitioner

## 2010-07-17 ENCOUNTER — Emergency Department (HOSPITAL_COMMUNITY)
Admission: EM | Admit: 2010-07-17 | Discharge: 2010-07-17 | Payer: Self-pay | Source: Home / Self Care | Admitting: Emergency Medicine

## 2010-07-19 ENCOUNTER — Telehealth (INDEPENDENT_AMBULATORY_CARE_PROVIDER_SITE_OTHER): Payer: Self-pay | Admitting: Nurse Practitioner

## 2010-07-30 ENCOUNTER — Encounter (INDEPENDENT_AMBULATORY_CARE_PROVIDER_SITE_OTHER): Payer: Self-pay | Admitting: *Deleted

## 2010-07-31 ENCOUNTER — Telehealth (INDEPENDENT_AMBULATORY_CARE_PROVIDER_SITE_OTHER): Payer: Self-pay | Admitting: Nurse Practitioner

## 2010-08-02 ENCOUNTER — Telehealth (INDEPENDENT_AMBULATORY_CARE_PROVIDER_SITE_OTHER): Payer: Self-pay | Admitting: Nurse Practitioner

## 2010-08-09 ENCOUNTER — Telehealth (INDEPENDENT_AMBULATORY_CARE_PROVIDER_SITE_OTHER): Payer: Self-pay | Admitting: Nurse Practitioner

## 2010-08-13 ENCOUNTER — Emergency Department (HOSPITAL_COMMUNITY)
Admission: EM | Admit: 2010-08-13 | Discharge: 2010-08-13 | Payer: Self-pay | Source: Home / Self Care | Admitting: Emergency Medicine

## 2010-08-19 LAB — GLUCOSE, CAPILLARY
Glucose-Capillary: 13 mg/dL — CL (ref 70–99)
Glucose-Capillary: 306 mg/dL — ABNORMAL HIGH (ref 70–99)
Glucose-Capillary: 266 mg/dL — ABNORMAL HIGH (ref 70–99)

## 2010-08-20 ENCOUNTER — Ambulatory Visit
Admission: RE | Admit: 2010-08-20 | Discharge: 2010-08-20 | Payer: Self-pay | Source: Home / Self Care | Attending: Nurse Practitioner | Admitting: Nurse Practitioner

## 2010-08-20 DIAGNOSIS — R599 Enlarged lymph nodes, unspecified: Secondary | ICD-10-CM | POA: Insufficient documentation

## 2010-08-20 DIAGNOSIS — S058X9A Other injuries of unspecified eye and orbit, initial encounter: Secondary | ICD-10-CM | POA: Insufficient documentation

## 2010-08-20 DIAGNOSIS — E785 Hyperlipidemia, unspecified: Secondary | ICD-10-CM | POA: Insufficient documentation

## 2010-08-20 LAB — CONVERTED CEMR LAB
Blood Glucose, Fingerstick: 15
Cholesterol, target level: 200 mg/dL
HDL goal, serum: 40 mg/dL
LDL Goal: 100 mg/dL

## 2010-08-21 ENCOUNTER — Telehealth (INDEPENDENT_AMBULATORY_CARE_PROVIDER_SITE_OTHER): Payer: Self-pay | Admitting: Nurse Practitioner

## 2010-08-25 ENCOUNTER — Encounter: Payer: Self-pay | Admitting: Emergency Medicine

## 2010-08-25 ENCOUNTER — Encounter: Payer: Self-pay | Admitting: Internal Medicine

## 2010-08-26 ENCOUNTER — Encounter: Payer: Self-pay | Admitting: Nephrology

## 2010-08-27 ENCOUNTER — Telehealth (INDEPENDENT_AMBULATORY_CARE_PROVIDER_SITE_OTHER): Payer: Self-pay | Admitting: Nurse Practitioner

## 2010-09-02 ENCOUNTER — Telehealth (INDEPENDENT_AMBULATORY_CARE_PROVIDER_SITE_OTHER): Payer: Self-pay | Admitting: Nurse Practitioner

## 2010-09-05 NOTE — Letter (Signed)
Summary: REFERRAL//NUTRITION  REFERRAL//NUTRITION   Imported By: Arta Bruce 09/11/2009 15:09:40  _____________________________________________________________________  External Attachment:    Type:   Image     Comment:   External Document

## 2010-09-05 NOTE — Progress Notes (Signed)
Summary: throat swollen  Phone Note Call from Patient Call back at [facility] Phone [phone]   Summary of Call: pt states when she woke up this morning her throat was swollen and she was having headaches...and also c/o ear pain....patient said with everything that happened at her appt yesterday she brought some of it up but today it was worse when she woke up. Initial call taken by: Hassell Halim CMA,  August 21, 2010 12:41 PM  Follow-up for Phone Call        Spoke with Jesse Fall -- only remedies are for pt. to gargle with salt water, take her allergy medication and Tylenol or tramadol for pain. Drink plenty of fluids.  Should go down within 3-5 days.   Pt. denies pets at home -- advised of provider's instructions.  Advised that if she has sudden trouble breathing, to go to the ED.  Verbalized understanding and agreement. Follow-up by: Dutch Quint RN,  August 21, 2010 3:21 PM

## 2010-09-05 NOTE — Progress Notes (Signed)
Summary: REFILL ON ULTRAM  Phone Note Call from Patient Call back at Home Phone [phone]   Reason for Call: Refill Medication Summary of Call: Monica [name] Monica [name] CALLED AND SAYS THAT SHE NEEDS A REFILL ON ULTRAM. SHE SAYS ITS BEEN ABOUT A MONTH AGO OR SO THAT SHE HAS HAD THEM FILLED. [facility] US[facility] [address]. Initial call taken by: Leodis Rains,  April 11, 2010 2:11 PM  Follow-up for Phone Call        Primary to see as not regularly filled for pt. Follow-up by: Julieanne Manson MD,  April 12, 2010 6:21 PM  Additional Follow-up for Phone Call Additional follow up Details #1::        Sent to N. Daphine Deutscher.  Dutch Quint RN  April 15, 2010 9:35 AM     Additional Follow-up for Phone Call Additional follow up Details #2::    Rx printed and in YOUR basket to fax to pharmacy notify pt to check with pharmacy later today Follow-up by: Lehman Prom FNP,  April 15, 2010 2:12 PM  Additional Follow-up for Phone Call Additional follow up Details #3:: Details for Additional Follow-up Action Taken: Pt. made aware.  [facility]    Additional Follow-up by: Dutch Quint RN,  April 15, 2010 2:53 PM  Prescriptions: ULTRAM 50 MG TABS (TRAMADOL HCL) One tablet by mouth two times a day as needed for pain  #60 x 0   Entered and Authorized by:   Lehman Prom FNP   Signed by:   Lehman Prom FNP on 04/15/2010   Method used:   Printed then faxed to ...       [phone]* (retail)       [address] [place], [place]  [address]       Ph: [phone] or [phone]       Fax: [phone]   RxID:   [id number]

## 2010-09-05 NOTE — Assessment & Plan Note (Signed)
Summary: Diabetes   Vital Signs:  Patient profile:   50 year old female Menstrual status:  irregular LMP:     05/2010 Weight:      187.8 pounds BMI:     31.36 Temp:     97.2 degrees F oral Pulse rate:   80 / minute Pulse rhythm:   regular Resp:     20 per minute BP sitting:   104 / 82  (left arm) Cuff size:   regular  Vitals Entered By: Levon Hedger (June 20, 2010 11:45 AM)  Nutrition Counseling: Patient's BMI is greater than 25 and therefore counseled on weight management options. CC: follow-up visit DM.Marland KitchenMarland Kitchenpossible sinus infection she states that she has been having alot of headaches, Hypertension Management Is Patient Diabetic? Yes Pain Assessment Patient in pain? no      CBG Result 120 CBG Device ID B  Does patient need assistance? Functional Status Self care Ambulation Normal LMP (date): 05/2010 LMP - Character: normal     Enter LMP: 05/2010 Last PAP Result  Specimen Adequacy: Satisfactory for evaluation.   Interpretation/Result:Negative for intraepithelial Lesion or Malignancy.      CC:  follow-up visit DM.Marland KitchenMarland Kitchenpossible sinus infection she states that she has been having alot of headaches and Hypertension Management.  History of Present Illness:  Pt into the office for f/u on diabetes  Obesity - down 10 pounds since the last visit Pt has finally decreased her lasix to 60mg  by mouth two times a day as per the advise of the nephrologist  Diabetes Management History:      The patient is a 50 years old female who comes in for evaluation of Type 1 Diabetes Mellitus.  She has not been enrolled in the "Diabetic Education Program".  She states understanding of dietary principles and is following her diet appropriately.  No sensory loss is reported.  Self foot exams are not being performed.  She is checking home blood sugars.  She says that she is exercising.  Duration of exercise is estimated to be 30-60  She is doing this 5 times per week.        Hypoglycemic  symptoms are not occurring.  No hyperglycemic symptoms are reported.        No changes have been made to her treatment plan since last visit.    Hypertension History:      She denies headache, chest pain, and palpitations.  She notes no problems with any antihypertensive medication side effects.        Positive major cardiovascular risk factors include diabetes, hypertension, and current tobacco user.  Negative major cardiovascular risk factors include female age less than 56 years old and negative family history for ischemic heart disease.        Positive history for target organ damage include renal insufficiency.  Further assessment for target organ damage reveals no history of ASHD, cardiac end-organ damage (CHF/LVH), stroke/TIA, peripheral vascular disease, or hypertensive retinopathy.     Allergies (verified): No Known Drug Allergies  Review of Systems General:  Denies fever. ENT:  Complains of sinus pressure. CV:  Denies chest pain or discomfort. Resp:  Complains of cough; chronic cough. GI:  Denies abdominal pain, nausea, and vomiting. MS:  Complains of joint pain; bil knee pain - unable to bend to pick up anything.Marland Kitchen  Physical Exam  General:  alert.   Head:  normocephalic.   Eyes:  pupils round.   Ears:  bil TM with clear fluid present no erythema frontal sinus  tenderss Mouth:  fair dentition.   Lungs:  few scattered expiratory wheezes Heart:  normal rate and regular rhythm.   Abdomen:  normal bowel sounds.   Msk:  up to the exam table Neurologic:  alert & oriented X3 and gait normal.   Skin:  color normal.   left upper extremity - healed Psych:  Oriented X3.    Diabetes Management Exam:    Foot Exam (with socks and/or shoes not present):       Sensory-Monofilament:          Left foot: normal          Right foot: normal   Impression & Recommendations:  Problem # 1:  DIABETES MELLITUS, TYPE I, ADULT ONSET (ICD-250.01) stable reinforced with pt the importance of  getting her blood sugar down Her updated medication list for this problem includes:    Lantus 100 Unit/ml Soln (Insulin glargine) .Marland Kitchen... 23 units subcutaneously every evening    Humalog 100 Unit/ml Soln (Insulin lispro (human)) .Marland Kitchen... Take as directed accordking to sliding scale  Orders: Capillary Blood Glucose/CBG (81191) UA Dipstick w/o Micro (manual) (47829) Hemoglobin A1C (83036)  Problem # 2:  HYPERTENSION, BENIGN ESSENTIAL (ICD-401.1) BP is doing well Her updated medication list for this problem includes:    Lasix 40 Mg Tabs (Furosemide) ..... One and 1/2 tablet by mouth two times a day  Problem # 3:  OBESITY (ICD-278.00) pt is down 10 pounds since the last visit advised her cessation of lasix  Problem # 4:  RENAL INSUFFICIENCY (ICD-588.9) pt has been to nephrology as ordered both he and this provider has advised that she keep lasix to 60mg  by mouth two times a day   Problem # 5:  TOBACCO ABUSE (ICD-305.1) advised cessation  Problem # 6:  BIPOLAR AFFECTIVE DISORDER (ICD-296.80) Pt still goes to see Dr. Darlys Gales Medications have been changed  Problem # 7:  SINUSITIS (ICD-473.9)  Her updated medication list for this problem includes:    Amoxicillin 500 Mg Tabs (Amoxicillin) ..... One tablet by mouth three times a day for infection  Problem # 8:  KNEE PAIN, RIGHT (ICD-719.46)  Her updated medication list for this problem includes:    Ultram 50 Mg Tabs (Tramadol hcl) ..... One tablet by mouth two times a day as needed for pain    Suboxone 8-2 Mg Subl (Buprenorphine hcl-naloxone hcl) ..... One tablet by mouth three times a day  **dx by dr. Manson Passey**    Diclofenac Sodium 75 Mg Tbec (Diclofenac sodium) ..... One tablet by mouth two times a day as needed for knee pain  Complete Medication List: 1)  Lasix 40 Mg Tabs (Furosemide) .... One and 1/2 tablet by mouth two times a day 2)  Lantus 100 Unit/ml Soln (Insulin glargine) .... 23 units subcutaneously every evening 3)  Humalog 100  Unit/ml Soln (Insulin lispro (human)) .... Take as directed accordking to sliding scale 4)  Valtrex 500 Mg Tabs (Valacyclovir hcl) .Marland Kitchen.. 1 tablet by mouth daily 5)  Gabapentin 800 Mg Tabs (Gabapentin) .... One tablet by mouth three times a day  **note change in dose** 6)  Ultram 50 Mg Tabs (Tramadol hcl) .... One tablet by mouth two times a day as needed for pain 7)  Vistaril 50 Mg Caps (Hydroxyzine pamoate) .Marland Kitchen.. 1 capsule by mouth three times a day as needed 8)  Seroquel Xr 300 Mg Xr24h-tab (Quetiapine fumarate) .Marland Kitchen.. 1 tablet by mouth daily 9)  Omeprazole 20 Mg Tbec (Omeprazole) .Marland Kitchen.. 1 capsule by  mouth daily for stomach 10)  Lancets Misc (Lancets) .... Dx 250.00 check blood sugar three times daily 11)  Prodigy Blood Glucose Test Strp (Glucose blood) .... Check blood sugar four times daily 12)  Valium 10 Mg Tabs (Diazepam) .... One tablet by mouth two times a day **rx by pysch** 13)  Advair Diskus 100-50 Mcg/dose Misc (Fluticasone-salmeterol) .Marland Kitchen.. 1 inhalation two times a day  **rinse mouth after use** 14)  Prodigy Lancing Device Misc (Lancet devices) .... Dispense to use with lancets 15)  Prodigy Twist Top Lancets 28g Misc (Lancets) .... Check blood sugar four times daily 16)  Prodigy Blood Glucose Monitor Devi (Blood glucose monitoring suppl) .... Dispense glucometer 17)  Suboxone 8-2 Mg Subl (Buprenorphine hcl-naloxone hcl) .... One tablet by mouth three times a day  **dx by dr. Manson Passey** 18)  Polyethylene Glycol Powd (Polyethylene glycol 1450) .Marland Kitchen.. 17gm with a  full glass of water daily 19)  Wrist Splint Misc (Elastic bandages & supports) .... Get right wrist splint dx - peripheral neuropathy 20)  Knee Support Misc (Elastic bandages & supports) .... Right knee support size to fit dx: 719.46 21)  Pristiq 50 Mg Xr24h-tab (Desvenlafaxine succinate) .Marland Kitchen.. 1 by mouth qam 22)  Loratadine 10 Mg Tabs (Loratadine) .... One tablet by mouth daily as needed for allergies 23)  Zetia 10 Mg Tabs (Ezetimibe)  .... One tablet by mouth daily for cholesterol **prior approval granted** 24)  Amoxicillin 500 Mg Tabs (Amoxicillin) .... One tablet by mouth three times a day for infection 25)  Diclofenac Sodium 75 Mg Tbec (Diclofenac sodium) .... One tablet by mouth two times a day as needed for knee pain  Other Orders: Admin 1st Vaccine (04540) Tdap => 71yrs IM (98119) Admin of Any Addtl Vaccine (14782)  Diabetes Management Assessment/Plan:      Her blood pressure goal is < 130/80.    Hypertension Assessment/Plan:      The patient's hypertensive risk group is category C: Target organ damage and/or diabetes.  Her calculated 10 year risk of coronary heart disease is 15 %.  Today's blood pressure is 104/82.  Her blood pressure goal is < 130/80.  Patient Instructions: 1)  You have been given the tetanus today. This is good for 10 years. 2)  Sinus infection - restart on loratadine 10mg  by mouth daily  3)  Also start amoxil 500mg  by mouth three times a day - take this with food 4)  You are still due for a mammogram.  When you are ready for this office to schedule let us know. 5)  Knee pain - You can continue to take ultram as needed for knee pain.  May take diclofenac 75mg  by mouth two times a day as needed for joints.  Take only if needed. 6)  Will avoid narcotics. 7)  Follow up in 3 months for diabetes or sooner if necessary Prescriptions: DICLOFENAC SODIUM 75 MG TBEC (DICLOFENAC SODIUM) One tablet by mouth two times a day as needed for knee pain  #50 x 1   Entered and Authorized by:   Lehman Prom FNP   Signed by:   Lehman Prom FNP on 06/20/2010   Method used:   Print then Give to Patient   RxID:   [id number] AMOXICILLIN 500 MG TABS (AMOXICILLIN) One tablet by mouth three times a day for infection  #30 x 0   Entered and Authorized by:   Lehman Prom FNP   Signed by:   Lehman Prom FNP on 06/20/2010   Method  used:   Print then Give to Patient   RxID:   [id number] LORATADINE  10 MG TABS (LORATADINE) One tablet by mouth daily as needed for allergies  #30 x 5   Entered and Authorized by:   Lehman Prom FNP   Signed by:   Lehman Prom FNP on 06/20/2010   Method used:   Print then Give to Patient   RxID:   [id number]    Orders Added: 1)  Est. Patient Level IV [95284] 2)  Capillary Blood Glucose/CBG [82948] 3)  UA Dipstick w/o Micro (manual) [81002] 4)  Hemoglobin A1C [83036] 5)  Admin 1st Vaccine [90471] 6)  Tdap => 39yrs IM [90715] 7)  Admin of Any Addtl Vaccine [13244]   Immunizations Administered:  Tetanus Vaccine:    Vaccine Type: Tdap    Site: left deltoid    Mfr: Sanofi Pasteur    Dose: 0.5 ml    Route: IM    Given by: Levon Hedger    Exp. Date: 08/21/2012    Lot #: [id number]    VIS given: 06/21/08 version given June 20, 2010.   Immunizations Administered:  Tetanus Vaccine:    Vaccine Type: Tdap    Site: left deltoid    Mfr: Sanofi Pasteur    Dose: 0.5 ml    Route: IM    Given by: Levon Hedger    Exp. Date: 08/21/2012    Lot #: [id number]    VIS given: 06/21/08 version given June 20, 2010.  Laboratory Results   Urine Tests  Date/Time Received: June 20, 2010 11:58 AM   Routine Urinalysis   Color: lt. yellow Appearance: Hazy Glucose: negative   (Normal Range: Negative) Bilirubin: negative   (Normal Range: Negative) Ketone: trace (5)   (Normal Range: Negative) Spec. Gravity: 1.020   (Normal Range: 1.003-1.035) Blood: negative   (Normal Range: Negative) pH: 5.0   (Normal Range: 5.0-8.0) Protein: negative   (Normal Range: Negative) Urobilinogen: 0.2   (Normal Range: 0-1) Nitrite: negative   (Normal Range: Negative) Leukocyte Esterace: negative   (Normal Range: Negative)     Blood Tests   Date/Time Received: June 20, 2010 12:47 PM   HGBA1C: 6.5%   (Normal Range: Non-Diabetic - 3-6%   Control Diabetic - 6-8%) CBG Random:: 120       Last LDL:                                                   192 (09/18/2009 9:25:00 PM)        Diabetic Foot Exam Last Podiatry Exam Date: 12/20/2009    10-g (5.07) Semmes-Weinstein Monofilament Test Performed by: Levon Hedger          Right Foot          Left Foot Visual Inspection               Test Control      normal         normal Site 1         normal         normal Site 2         normal         normal Site 3         normal         normal Site 4  normal         normal Site 5         normal         normal Site 6         normal         normal Site 7         normal         normal Site 8         normal         normal Site 9         normal         normal Site 10         normal         normal  Impression      normal         normal   Prevention & Chronic Care Immunizations   Influenza vaccine: given at [facility]  (05/28/2010)    Tetanus booster: 06/20/2010: Tdap   Td booster deferral: Not available  (10/11/2009)    Pneumococcal vaccine: Pneumovax  (03/30/2009)  Other Screening   Pap smear:  Specimen Adequacy: Satisfactory for evaluation.   Interpretation/Result:Negative for intraepithelial Lesion or Malignancy.     (10/11/2009)   Pap smear action/deferral: Ordered  (10/11/2009)   Pap smear due: 10/2010    Mammogram: Refused  (06/20/2010)   Mammogram action/deferral: Refused  (06/20/2010)   Smoking status: current  (03/22/2010)   Smoking cessation counseling: yes  (08/15/2008)  Diabetes Mellitus   HgbA1C: 6.5  (06/20/2010)   HgbA1C action/deferral: Ordered  (10/11/2009)   Hemoglobin A1C due: 12/16/2009    Eye exam: Not documented   Diabetic eye exam action/deferral: Ophthalmology referral  (10/11/2009)    Foot exam: yes  (06/20/2010)   Foot exam action/deferral: Do today   High risk foot: Not documented   Foot care education: Not documented    Urine microalbumin/creatinine ratio: Not documented   Urine microalbumin action/deferral: Ordered  Lipids   Total Cholesterol: 271  (09/18/2009)    LDL: 192  (09/18/2009)   LDL Direct: Not documented   HDL: 51  (09/18/2009)   Triglycerides: 142  (09/18/2009)  Hypertension   Last Blood Pressure: 104 / 82  (06/20/2010)   Serum creatinine: 1.14  (09/18/2009)   Serum potassium 3.8  (09/18/2009)  Self-Management Support :    Diabetes self-management support: Not documented    Hypertension self-management support: Not documented

## 2010-09-05 NOTE — Progress Notes (Signed)
Summary: Took overdose of Benazepril in error  Phone Note Call from Patient   Summary of Call: States that she thought she was taking her furosemide and took three Benazepril 10 mg. tablets.  States that it scared her and she immediately sat down and hasn't really moved all day since she took them -- wants to know what to do.  She is wobbly, but still able to walk, original BP was 128/70, has dropped to 78/63.  Reviewed with Wende Mott -- advised to go to urgent care or ED for evaluation and treatment.  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  Dutch Quint RN  April 15, 2010 2:57 PM   Follow-up for Phone Call        Try to contact pt now to see if she went to the ER and what was the treatment plan. if she did NOT go - have pt recheck her BP and find out what it is, She needs to drink plenty of fluids to prevent hypotension, skip benazepril dose tomorrow Follow-up by: Lehman Prom FNP,  April 15, 2010 4:34 PM  Additional Follow-up for Phone Call Additional follow up Details #1::        Not taking Benazepril any more.  She did not go to ER; current BP is 83/68.  Advised again to drink plenty of fluids.  Sounds drowsy.  Will call back in 30 minutes to recheck BP.  Dutch Quint RN  April 15, 2010 4:46 PM  Called pt. to f/u BP -- now 88/63.  Sounds very drowsy and words are slightly slurring.  Advised to go to ER for evaluation and tx.  States she will go right now-has someone to drive her.  Requested to call office tomorrow to let us know what treatment was done.  Dutch Quint RN  April 15, 2010 5:30 PM     Additional Follow-up for Phone Call Additional follow up Details #2::    noted Follow-up by: Lehman Prom FNP,  April 15, 2010 6:08 PM

## 2010-09-05 NOTE — Letter (Signed)
Summary: [facility]   Imported By: Arta Bruce 06/14/2010 14:57:55  _____________________________________________________________________  External Attachment:    Type:   Image     Comment:   External Document

## 2010-09-05 NOTE — Assessment & Plan Note (Signed)
Summary: Reschedule CPE  Pt into the office for a CPE.  However she is currently on her menses. She will reschedule CPE. Diabetes education done by Drucilla Schmidt labs done in preparation for CPE  Vital Signs:  Patient profile:   50 year old female Menstrual status:  still getting cycle twice a month LMP:     09/18/2009 Weight:      191.6 pounds BMI:     32.00 BSA:     1.94 Temp:     97.8 degrees F oral Pulse rate:   77 / minute Pulse rhythm:   regular Resp:     20 per minute BP sitting:   111 / 75  (left arm) Cuff size:   regular  Vitals Entered ByLevon Hedger (September 18, 2009 11:12 AM) CC: CPP...pt is on cycle Is Patient Diabetic? Yes Pain Assessment Patient in pain? yes     Location: feet CBG Result 259  Does patient need assistance? Functional Status Self care Ambulation Normal LMP (date): 09/18/2009 LMP - Character: normal     Enter LMP: 09/18/2009   CC:  CPP...pt is on cycle.  Allergies (verified): No Known Drug Allergies   Complete Medication List: 1)  Lasix 40 Mg Tabs (Furosemide) .... One and 1/2 tablet by mouth three times a day for fluid 2)  Lantus 100 Unit/ml Soln (Insulin glargine) .... 23 units subcutaneously every evening 3)  Humalog 100 Unit/ml Soln (Insulin lispro (human)) .... Take as directed accordking to sliding scale 4)  Valtrex 500 Mg Tabs (Valacyclovir hcl) .Marland Kitchen.. 1 tablet by mouth daily 5)  Gabapentin 600 Mg Tabs (Gabapentin) .Marland Kitchen.. 1 tablet by mouth three times a day 6)  Ultram 50 Mg Tabs (Tramadol hcl) .... Take 1-2 tablets by mouth every 8 hours as needed for back pain 7)  Vistaril 50 Mg Caps (Hydroxyzine pamoate) .Marland Kitchen.. 1 capsule by mouth three times a day as needed 8)  Seroquel Xr 300 Mg Xr24h-tab (Quetiapine fumarate) .Marland Kitchen.. 1 tablet by mouth daily 9)  Omeprazole 20 Mg Tbec (Omeprazole) .Marland Kitchen.. 1 capsule by mouth daily for stomach 10)  Lancets Misc (Lancets) .... Dx 250.00 check blood sugar three times daily 11)  Prodigy Blood Glucose  Test Strp (Glucose blood) .... Check blood sugar four times daily 12)  Klonopin 1 Mg Tabs (Clonazepam) .... Take one tablet 3 times a day *dr reddy 13)  Advair Diskus 100-50 Mcg/dose Misc (Fluticasone-salmeterol) .Marland Kitchen.. 1 inhalation two times a day  **rinse mouth after use** 14)  Prodigy Lancing Device Misc (Lancet devices) .... Dispense to use with lancets 15)  Prodigy Twist Top Lancets 28g Misc (Lancets) .... Check blood sugar four times daily 16)  Prodigy Blood Glucose Monitor Devi (Blood glucose monitoring suppl) .... Dispense glucometer 17)  Suboxone 8-2 Mg Subl (Buprenorphine hcl-naloxone hcl) .... One tablet by mouth three times a day  **dx by dr. Manson Passey** 18)  Polyethylene Glycol Powd (Polyethylene glycol 1450) .Marland Kitchen.. 17gm with a  full glass of water daily 19)  Wrist Splint Misc (Elastic bandages & supports) .... Get right wrist splint dx - peripheral neuropathy 20)  Knee Support Misc (Elastic bandages & supports) .... Right knee support size to fit dx: 719.46 21)  Pristiq 50 Mg Xr24h-tab (Desvenlafaxine succinate) .Marland Kitchen.. 1 by mouth qam 22)  Cortisporin 1 % Oint (Bacit-poly-neo hc) .... One application topically two times a day to affected area 23)  Loratadine 10 Mg Tabs (Loratadine) .... One tablet by mouth daily as needed for allergies  Other Orders: T-Comprehensive  Metabolic Panel [phone]) T-Lipid Profile [phone]) T-CBC w/Diff [phone]) T-Syphilis Test ([facility]PR) [phone]) T-TSH [phone]) T- Hemoglobin A1C [phone]) T-HIV Antibody  (Reflex) [phone])  Laboratory Results   Blood Tests     CBG Random:: 259  Date/Time Received: September 18, 2009 12:41 PM  Date/Time Reported: September 18, 2009 12:41 PM   Other Tests  Rapid HIV: negative

## 2010-09-05 NOTE — Progress Notes (Signed)
Summary: CHECKING ON FOOT REFERRAL  Phone Note Call from Patient Call back at Home Phone [phone]   Reason for Call: Referral Summary of Call: MARTIN PT. MS Hessel CALLED TO SEE IF WE HAVE DONE HER PODIATRIST REFERRAL ABOUT HER FOOT. SHE SAYS IT WAS MENTIONED ON HER LAST VISIT HERE. Initial call taken by: Leodis Rains,  January 23, 2010 11:19 AM  Follow-up for Phone Call        forward to N. Daphine Deutscher, FNP last ov was 5/19. Follow-up by: Levon Hedger,  January 23, 2010 11:24 AM  Additional Follow-up for Phone Call Additional follow up Details #1::        Yes, pt called back and notified provider that foot pain contiued - I informed pt that I would do the podiatry referral Will order podiatry referral now  send to Candi Leash Additional Follow-up by: Lehman Prom FNP,  January 23, 2010 11:46 AM

## 2010-09-05 NOTE — Progress Notes (Signed)
Summary: Refill request  Phone Note Call from Patient   Summary of Call: Pt has a bacteria infection in her left arm (elbow down wrist),  and the pt went to the ER and the provider at there prescribed her sulfameth which help with the infection.  The pt would like to know if the provider can prescribe the same medication.   [facility] Initial call taken by: Manon Hilding,  February 28, 2010 12:00 PM  Follow-up for Phone Call        Almost healed, can still see that it's swollen at one end, states that she's had it for a year.  It itches, then she scratches it, causing more irritation and bleeding.  Explained that a antibiotic doesn't stop working the day it's completed -- discussed cellulitis and contributing factor of scratching.  Has an appt. with dermatologist on Monday, will wait to be seen until then since there aren't any openings at this office. Follow-up by: Dutch Quint RN,  February 28, 2010 2:12 PM

## 2010-09-05 NOTE — Progress Notes (Signed)
Summary: Fluid Retention  Phone Note From Other Clinic   Summary of Call: Gavin Pound from [facility] called she says that this pt has been calling because she says that she is retaining fluid. Gavin Pound said that she called to the [facility] location and spoke with Harriett Sine and Harriett Sine directed her to this office because this is where the patient is normally seen.  She state that the Nephrologist is treating her for Hypokalemia imbalance.  pt has been non- compliant with their office and did not have labs drawn like they requested.  she is wanting to know if pt need to make an appt with her provider to discuss the fluid retention issue. Initial call taken by: Levon Hedger,  August 02, 2010 2:22 PM  Follow-up for Phone Call        See previous phone note - this is not a new problem for the pt anytime that she gains weight she seems to think it is fluid related.  She gets lasix from a family member despite provider requests for her to stay on ordered amount.  She will continue to get the amount that she always has. Pt was doing well during last visit, weight down and she even commented on decrease use of fluid pills.  Advised pt that everyone has some changes in diet over the holidays so perheps this is her case as well. She should try dietary restriction and given it few days and check for resolution. Pt did not report any distress such as SOB to triage nurse Follow-up by: Lehman Prom FNP,  August 02, 2010 5:00 PM

## 2010-09-05 NOTE — Letter (Signed)
Summary: *Referral Letter  [facility]  [address] [place], [place] [address]   Phone: [phone]  Fax: [phone]    09/28/2009  Monica [name] [address] [place], [place]  [address]  Phone: [phone]  To whom it may concern:  Monica [name] is an established patient in this office.  She has ongoing follow up for multiple chronic issues as noted below. She is currently not employed at this time and her medical problems make seeking employment extremely difficult. 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)   Current Medications: 1)  LASIX 40 MG TABS (FUROSEMIDE) One and 1/2 tablet by mouth three times a day for fluid 2)  LANTUS 100 UNIT/ML  SOLN (INSULIN GLARGINE) 23 units Subcutaneously every evening 3)  HUMALOG 100 UNIT/ML  SOLN (INSULIN LISPRO (HUMAN)) take as directed accordking to sliding scale 4)  VALTREX 500 MG TABS (VALACYCLOVIR HCL) 1 tablet by mouth daily 5)  GABAPENTIN 600 MG TABS (GABAPENTIN) 1 tablet by mouth three times a day 6)  ULTRAM 50 MG TABS (TRAMADOL HCL) Take 1-2 tablets by mouth every 8 hours as needed for back pain 7)  VISTARIL 50 MG CAPS (HYDROXYZINE PAMOATE) 1 capsule by mouth three times a day as needed 8)  SEROQUEL XR 300 MG XR24H-TAB (QUETIAPINE FUMARATE) 1 tablet by mouth daily 9)   OMEPRAZOLE 20 MG TBEC (OMEPRAZOLE) 1 capsule by mouth daily for stomach 10)  LANCETS  MISC (LANCETS) Dx 250.00 check blood sugar three times daily 11)  PRODIGY BLOOD GLUCOSE TEST  STRP (GLUCOSE BLOOD) check blood sugar four times daily 12)  KLONOPIN 1 MG TABS (CLONAZEPAM) take one tablet 3 times a day *Dr Betti Cruz 13)  ADVAIR DISKUS 100-50 MCG/DOSE MISC (FLUTICASONE-SALMETEROL) 1 inhalation two times a day  **rinse mouth after use** 14)  PRODIGY LANCING DEVICE  MISC (LANCET DEVICES) dispense to use with lancets 15)  PRODIGY TWIST TOP LANCETS 28G  MISC (LANCETS) check blood sugar four times daily 16)  PRODIGY BLOOD GLUCOSE MONITOR  DEVI (BLOOD GLUCOSE MONITORING SUPPL) dispense glucometer 17)  SUBOXONE 8-2 MG SUBL (BUPRENORPHINE HCL-NALOXONE HCL) One tablet by mouth three times a day  **Dx by Dr. Manson Passey** 18)  POLYETHYLENE GLYCOL  POWD (POLYETHYLENE GLYCOL 1450) 17gm with a  full glass of water daily 19)  WRIST SPLINT  MISC (ELASTIC BANDAGES & SUPPORTS) Get right wrist splint Dx - peripheral neuropathy 20)  KNEE SUPPORT  MISC (ELASTIC BANDAGES & SUPPORTS) Right knee support size to fit Dx: 719.46 21)  PRISTIQ 50 MG XR24H-TAB (DESVENLAFAXINE SUCCINATE) 1 by mouth qam 22)  CORTISPORIN 1 % OINT (BACIT-POLY-NEO HC) One application topically two times a day to affected area 23)  LORATADINE 10 MG TABS (LORATADINE) One tablet by mouth daily as needed for allergies 24)  ZETIA 10 MG TABS (EZETIMIBE) One tablet by mouth daily for cholesterol   Please contact us if you have any further questions or need  additional information.  Sincerely,    Lehman Prom FNP [facility]

## 2010-09-05 NOTE — Assessment & Plan Note (Signed)
Summary: Edema/Obesity   Vital Signs:  Patient profile:   50 year old female Menstrual status:  still getting cycle twice a month LMP:     07/2009 Weight:      191.1 pounds BMI:     31.92 BSA:     1.94 Temp:     97.8 degrees F oral Pulse rate:   94 / minute Pulse rhythm:   regular Resp:     20 per minute BP sitting:   104 / 65  (left arm) Cuff size:   regular  Vitals Entered By: Levon Hedger (August 06, 2009 11:55 AM) CC: medication refills Is Patient Diabetic? Yes Pain Assessment Patient in pain? no      CBG Result 109 CBG Device ID B  Does patient need assistance? Functional Status Self care Ambulation Normal Comments pt did not bring medications today LMP (date): 07/2009 LMP - Character: normal     Enter LMP: 07/2009   CC:  medication refills.  History of Present Illness:  Pt into the office for follow up on medications. Edema - still with swelling in her hands. She continues to take lasix and had restarted zaroxolyn that she has from a previous visit. Nephrology d/c'd this medications.  Obesity - pt has concerns about the her belly fat.   She is aware that she needs to increase her exercise. She has started to watch some nutrition shows. "I used to work out and be a vegetarian" Down 5 pounds since her last visit in this office.  Diabetes Management History:      The patient is a 50 years old female who comes in for evaluation of Type 1 Diabetes Mellitus.  She has not been enrolled in the "Diabetic Education Program".  She states lack of understanding of dietary principles and is not following her diet appropriately.  No sensory loss is reported.  Self foot exams are not being performed.  She is checking home blood sugars.  She says that she is exercising.  Duration of exercise is estimated to be 30-60  She is doing this 5 times per week.        Hypoglycemic symptoms are not occurring.  No hyperglycemic symptoms are reported.        There are no  symptoms to suggest diabetic complications.  No changes have been made to her treatment plan since last visit.    Habits & Providers  Exercise-Depression-Behavior     Type of exercise: N/A  Allergies (verified): No Known Drug Allergies  Review of Systems General:  Denies fever. ENT:  Complains of sinus pressure; denies earache and sore throat. CV:  Denies chest pain or discomfort. Resp:  Denies cough. GI:  Denies abdominal pain, nausea, and vomiting. GU:  currently with an herpes outbreak - she has stopped taking her valtrex for a couple of days but she has now filled the rx and restarted. MS:  Complains of joint pain; bil knee pain - usually when going from sitting to standing positions. stiffness has increased. Derm:  Complains of lesion(s); left inner arm with poor wound. Neuro:  Complains of tingling; chronic in bil feet.  Physical Exam  General:  alert.   Head:  normocephalic.   Ears:  ear piercing(s) noted.   Lungs:  normal breath sounds.   Heart:  normal rate and regular rhythm.   Abdomen:  normal bowel sounds.   Neurologic:  alert & oriented X3.   Skin:  color normal.   Psych:  Oriented  X3.    Diabetes Management Exam:    Foot Exam (with socks and/or shoes not present):       Inspection:          Left foot: normal          Right foot: normal       Nails:          Left foot: normal          Right foot: normal   Impression & Recommendations:  Problem # 1:  EDEMA (ICD-782.3) advised pt to stop taking the zaroxolyn DASH diet Her updated medication list for this problem includes:    Lasix 40 Mg Tabs (Furosemide) ..... One and 1/2 tablet by mouth three times a day for fluid  Problem # 2:  OBESITY (ICD-278.00) Assessment: Unchanged pt aware that she needs to lose weight pt to keep appt with the nutritionist  Problem # 3:  DIABETES MELLITUS, TYPE I, ADULT ONSET (ICD-250.01) stable Her updated medication list for this problem includes:    Lantus 100 Unit/ml  Soln (Insulin glargine) .Marland Kitchen... 23 units subcutaneously every evening    Humalog 100 Unit/ml Soln (Insulin lispro (human)) .Marland Kitchen... Take as directed accordking to sliding scale  Problem # 4:  HYPERTENSION, BENIGN ESSENTIAL (ICD-401.1)  Her updated medication list for this problem includes:    Lasix 40 Mg Tabs (Furosemide) ..... One and 1/2 tablet by mouth three times a day for fluid  Complete Medication List: 1)  Lasix 40 Mg Tabs (Furosemide) .... One and 1/2 tablet by mouth three times a day for fluid 2)  Lantus 100 Unit/ml Soln (Insulin glargine) .... 23 units subcutaneously every evening 3)  Humalog 100 Unit/ml Soln (Insulin lispro (human)) .... Take as directed accordking to sliding scale 4)  Valtrex 500 Mg Tabs (Valacyclovir hcl) .Marland Kitchen.. 1 tablet by mouth daily 5)  Gabapentin 600 Mg Tabs (Gabapentin) .Marland Kitchen.. 1 tablet by mouth three times a day 6)  Ultram 50 Mg Tabs (Tramadol hcl) .... Take 1-2 tablets by mouth every 8 hours as needed for back pain 7)  Vistaril 50 Mg Caps (Hydroxyzine pamoate) .Marland Kitchen.. 1 capsule by mouth three times a day as needed 8)  Seroquel Xr 300 Mg Xr24h-tab (Quetiapine fumarate) .Marland Kitchen.. 1 tablet by mouth daily 9)  Omeprazole 20 Mg Tbec (Omeprazole) .Marland Kitchen.. 1 capsule by mouth daily for stomach 10)  Lancets Misc (Lancets) .... Dx 250.00 check blood sugar three times daily 11)  Prodigy Blood Glucose Test Strp (Glucose blood) .... Check blood sugar four times daily 12)  Klonopin 1 Mg Tabs (Clonazepam) .... Take one tablet 3 times a day *dr reddy 13)  Advair Diskus 100-50 Mcg/dose Misc (Fluticasone-salmeterol) .Marland Kitchen.. 1 inhalation two times a day  **rinse mouth after use** 14)  Prodigy Lancing Device Misc (Lancet devices) .... Dispense to use with lancets 15)  Prodigy Twist Top Lancets 28g Misc (Lancets) .... Check blood sugar four times daily 16)  Prodigy Blood Glucose Monitor Devi (Blood glucose monitoring suppl) .... Dispense glucometer 17)  Suboxone 8-2 Mg Subl (Buprenorphine hcl-naloxone  hcl) .... One tablet by mouth three times a day  **dx by dr. Manson Passey** 18)  Polyethylene Glycol Powd (Polyethylene glycol 1450) .Marland Kitchen.. 17gm with a  full glass of water daily 19)  Wrist Splint Misc (Elastic bandages & supports) .... Get right wrist splint dx - peripheral neuropathy 20)  Knee Support Misc (Elastic bandages & supports) .... Right knee support size to fit dx: 719.46 21)  Pristiq 50 Mg Xr24h-tab (Desvenlafaxine  succinate) .Marland Kitchen.. 1 by mouth qam 22)  Cortisporin 1 % Oint (Bacit-poly-neo hc) .... One application topically two times a day to affected area 23)  Loratadine 10 Mg Tabs (Loratadine) .... One tablet by mouth daily as needed for allergies  Other Orders: Capillary Blood Glucose/CBG ([address])  Diabetes Management Assessment/Plan:      Her blood pressure goal is < 130/80.    Patient Instructions: 1)  Follow up in this office in 6-8 weeks for a complete physical exam. 2)  You will need pap smear, mammogram, PHQ-9, u/a. 3)  Reschedule your appointment with nutritionist. Prescriptions: LORATADINE 10 MG TABS (LORATADINE) One tablet by mouth daily as needed for allergies  #30 x 3   Entered and Authorized by:   Lehman Prom FNP   Signed by:   Lehman Prom FNP on 08/06/2009   Method used:   Electronically to        [facility]  [phone]* (retail)       [address] [place], [place]  [address]       Ph: [phone] or [phone]       Fax: [phone]   RxID:   [id number]   Last LDL:                                                 172 (04/05/2008 12:35:00 AM)      Diabetic Foot Exam Foot Inspection Is there a history of a foot ulcer?              No Is there a foot ulcer now?              No Can the patient see the bottom of their feet?          No Are the shoes appropriate in style and fit?          Yes Is there swelling or an abnormal foot shape?          No Are the toenails long?                No Are the toenails thick?                No Are  the toenails ingrown?              No Is there heavy callous build-up?              No Is there a claw toe deformity?                          No Is there elevated skin temperature?            No Is there limited ankle dorsiflexion?            No Is there foot or ankle muscle weakness?            No Do you have pain in calf while walking?           No      Comments: pt hi tright big toe 2 nights ago.  it is swollen and red with pain

## 2010-09-05 NOTE — Letter (Signed)
Summary: *Referral Letter  [facility]  [address] [place], [place] [address]   Phone: [phone]  Fax: [phone]    03/22/2010  Monica [name] [address] [place], [place]  [address]  Phone: [phone]  To whom it may concern:  Monica [name] is an established patient in this office.  She has the following medical problems for which she is still being treated.  She is not able to work at this time due to the below problems.  Current Medical Problems: 1)  FOOT PAIN, RIGHT (ICD-729.5) 2)  OBESITY (ICD-278.00) 3)  SKIN LESION (ICD-709.9) 4)  RENAL INSUFFICIENCY (ICD-588.9) 5)  SHORTNESS OF BREATH (ICD-786.05) 6)  ANKLE PAIN, LEFT (ICD-719.47) 7)  KNEE PAIN, RIGHT (ICD-719.46) 8)  EDEMA (ICD-782.3) 9)  URI (ICD-465.9) 10)  SKIN ULCER, CHRONIC (ICD-707.9) 11)  DIABETES MELLITUS, TYPE I, ADULT ONSET (ICD-250.01) 12)  DYSURIA (ICD-788.1) 13)  HSV (ICD-054.9) 14)  HX SUBSTANCE ABUSE, MULTIPLE (ICD-305.90) 15)  HEPATITIS C (ICD-070.51) 16)  ANEMIA, CHRONIC (ICD-281.9) 17)  TOBACCO ABUSE (ICD-305.1) 18)  Hx OPIOID DEPENDENCE (ICD-304.00) 19)  HYPERTENSION, BENIGN ESSENTIAL (ICD-401.1) 20)  BIPOLAR AFFECTIVE DISORDER (ICD-296.80) 21)  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)   Current Medications: 1)  LASIX 40 MG TABS (FUROSEMIDE) One and 1/2 tablet by mouth three times a day for fluid 2)  LANTUS 100 UNIT/ML  SOLN (INSULIN GLARGINE) 23 units Subcutaneously every evening 3)  HUMALOG 100 UNIT/ML  SOLN (INSULIN LISPRO (HUMAN)) take as directed accordking to sliding scale 4)  VALTREX 500 MG TABS (VALACYCLOVIR HCL) 1 tablet by mouth daily 5)  GABAPENTIN 800 MG TABS (GABAPENTIN) One tablet by mouth three times a day  **note change in dose** 6)  ULTRAM 50 MG TABS (TRAMADOL HCL) One tablet by mouth two times a day as needed for pain 7)  VISTARIL 50 MG CAPS (HYDROXYZINE PAMOATE) 1 capsule by mouth three times a day as needed 8)  SEROQUEL XR 300  MG XR24H-TAB (QUETIAPINE FUMARATE) 1 tablet by mouth daily 9)  OMEPRAZOLE 20 MG TBEC (OMEPRAZOLE) 1 capsule by mouth daily for stomach 10)  LANCETS  MISC (LANCETS) Dx 250.00 check blood sugar three times daily 11)  PRODIGY BLOOD GLUCOSE TEST  STRP (GLUCOSE BLOOD) check blood sugar four times daily 12)  KLONOPIN 1 MG TABS (CLONAZEPAM) take one tablet 3 times a day *Dr Betti Cruz 13)  ADVAIR DISKUS 100-50 MCG/DOSE MISC (FLUTICASONE-SALMETEROL) 1 inhalation two times a day  **rinse mouth after use** 14)  PRODIGY LANCING DEVICE  MISC (LANCET DEVICES) dispense to use with lancets 15)  PRODIGY TWIST TOP LANCETS 28G  MISC (LANCETS) check blood sugar four times daily 16)  PRODIGY BLOOD GLUCOSE MONITOR  DEVI (BLOOD GLUCOSE MONITORING SUPPL) dispense glucometer 17)  SUBOXONE 8-2 MG SUBL (BUPRENORPHINE HCL-NALOXONE HCL) One tablet by mouth three times a day  **Dx by Dr. Manson Passey** 18)  POLYETHYLENE GLYCOL  POWD (POLYETHYLENE GLYCOL 1450) 17gm with a  full glass of water daily 19)  WRIST SPLINT  MISC (ELASTIC BANDAGES & SUPPORTS) Get right wrist splint Dx - peripheral neuropathy 20)  KNEE SUPPORT  MISC (ELASTIC BANDAGES & SUPPORTS) Right knee support size to fit Dx: 719.46 21)  PRISTIQ 50 MG XR24H-TAB (DESVENLAFAXINE SUCCINATE) 1 by mouth qam 22)  CORTISPORIN 1 % OINT (BACIT-POLY-NEO HC) One application topically two times a day to affected area 23)  LORATADINE 10 MG TABS (LORATADINE) One tablet by mouth daily as needed for allergies 24)  ZETIA 10 MG TABS (EZETIMIBE) One tablet by  mouth daily for cholesterol **Prior approval granted**   Please contact us if you have any further questions or need additional information.   Sincerely,   Monica Prom FNP [facility]

## 2010-09-05 NOTE — Progress Notes (Signed)
Summary: pt is calling to see if you can rx medicine for pink-aye  Phone Note Call from Patient Call back at [phone]   Summary of Call: patient is calling to see if you call in medicine for pink-aye she has app with N.Martin on 11.17.11 Initial call taken by: Domenic Polite,  June 06, 2010 2:04 PM  Follow-up for Phone Call        yesterday rt. eye was hurting, today eye reddened and watering, nasal drainage,  pain 10/10. Advised to apply warm compresses may take XS Tylenol or Ibuprofen. Has not been taking her Claritin advised to restart. If gets worse call office. Gaylyn Cheers RN  June 06, 2010 2:24 PM

## 2010-09-05 NOTE — Progress Notes (Signed)
Summary: Refill on Tramadol and Medicaid limits  Phone Note Call from Patient   Caller: Patient Call For: Lehman Prom FNP Reason for Call: Refill Medication Summary of Call: States that the current dosage of 1-2 Tramadol every 8 hours is not enough and she's run out - she takes 3 at a time.  Would like you to change the Rx to reflect 3 tabs per dose.  Also, state that the pharmacy states that she has to talk to her provider re she has >11 script per month and in order to have all of her meds paid by Medicaid, she needs to reduce the number.  Dutch Quint RN  January 08, 2010 3:06 PM  Initial call taken by: Dutch Quint RN,  January 08, 2010 3:06 PM  Follow-up for Phone Call        will refill meds on 01/11/2010 - pt notifed during last visit that 60 tablets should last her 1 month so if she is taking more than 3 per day then she will have to adjust according. also have pt ask the pharmacy to send me a medicaid override sheet since she is over the limit for her medications - i will try to see if it will be approved Follow-up by: Lehman Prom FNP,  January 08, 2010 3:33 PM  Additional Follow-up for Phone Call Additional follow up Details #1::        Left message on answering machine to return call. Dutch Quint RN  January 09, 2010 9:53 AM  Spoke with pt. and advised of provider's response -- verbalized understanding. Dutch Quint RN  January 10, 2010 9:56 AM  Additional Follow-up by: Dutch Quint RN,  January 10, 2010 9:56 AM     Appended Document: Refill on Tramadol and Medicaid limits Prescriptions: ULTRAM 50 MG TABS (TRAMADOL HCL) One tablet by mouth two times a day as needed for pain  #60 x 0   Entered and Authorized by:   Lehman Prom FNP   Signed by:   Lehman Prom FNP on 01/10/2010   Method used:   Printed then faxed to ...       [facility]  [phone] (retail)       [address] [place], [place]  [address]       Ph: [phone] or [phone]       Fax:  [phone]   RxID:   [id number]  Pt. called back to ask that we speak to pharmacy re medicaid override.  [facility] states that there is no override "sheet" -- that when the patient reaches 11 Rx per month, that the PHARMACY makes a phone call to "lock" in the pt. and that it's usually good for about six months.  Pharmacy states that if the occasion arises this month that she hits 11 Rx, that they will make the necessary phone call.  Dutch Quint RN  January 10, 2010 12:11 PM]

## 2010-09-05 NOTE — Progress Notes (Signed)
Summary: Generalized increased edema  Phone Note Call from Patient   Summary of Call: LASIX IS MAKING HER HAVE FLUID IN THE EVENING/ SHE IS OUT OF HER INSULIN DIDN'T REALIZE IT WAS SO CLOSE TO END  [facility]   [phone] Initial call taken by: Arta Bruce,  August 09, 2010 8:43 AM  Follow-up for Phone Call        States that she is swelling in her abdomen, hands, legs/feet, face, eyes.  Dr. Edd Arbour office Rx'd her potassium, has been taking since her last visit to him.  Per pt., Dr. Edd Arbour office says she should stay on 60 mg. of furosemide three times a day.  Wants you to call their office - Gavin Pound (nurse) ext. [phone].    Refills for lantus, humalog and test strips completed.   Follow-up by: Dutch Quint RN,  August 09, 2010 9:19 AM  Additional Follow-up for Phone Call Additional follow up Details #1::        Called Gavin Pound and reviewed lasix dosing with her. Pt to stay at same dosage and that is clear now between both offices Additional Follow-up by: Lehman Prom FNP,  August 09, 2010 2:15 PM    Prescriptions: PRODIGY BLOOD GLUCOSE TEST  STRP (GLUCOSE BLOOD) check blood sugar four times daily  #1 month qs x 3   Entered by:   Dutch Quint RN   Authorized by:   Lehman Prom FNP   Signed by:   Dutch Quint RN on 08/09/2010   Method used:   Electronically to        [facility]  [phone]* (retail)       [address] [place], [place]  [address]       Ph: [phone] or [phone]       Fax: [phone]   RxID:   [id number] HUMALOG 100 UNIT/ML  SOLN (INSULIN LISPRO (HUMAN)) take as directed accordking to sliding scale  #1 month x 3   Entered by:   Dutch Quint RN   Authorized by:   Lehman Prom FNP   Signed by:   Dutch Quint RN on 08/09/2010   Method used:   Electronically to        [facility]  [phone]* (retail)       [address] [place], [place]  [address]       Ph: [phone] or [phone]   Fax: [phone]   RxID:   [id number]

## 2010-09-05 NOTE — Letter (Signed)
Summary: mailed requested records to [facility]  mailed requested records to [facility]   Imported By: Arta Bruce 03/15/2010 11:15:05  _____________________________________________________________________  External Attachment:    Type:   Image     Comment:   External Document

## 2010-09-05 NOTE — Assessment & Plan Note (Signed)
Summary: Diabetes   Vital Signs:  Patient profile:   50 year old female Menstrual status:  irregular LMP:     03/2010 Weight:      197.0 pounds BMI:     32.90 Temp:     97.3 degrees F oral Pulse rate:   71 / minute Pulse rhythm:   regular Resp:     16 per minute BP sitting:   105 / 71  (left arm) Cuff size:   regular  Vitals Entered By: Levon Hedger (March 22, 2010 11:35 AM)  Nutrition Counseling: Patient's BMI is greater than 25 and therefore counseled on weight management options. CC: follow-up visit 3 month  DM, Depression, Hypertension Management Is Patient Diabetic? Yes Pain Assessment Patient in pain? yes     Location: feet CBG Result 241 CBG Device ID B  Does patient need assistance? Functional Status Self care Ambulation Normal LMP (date): 03/2010 LMP - Character: normal     Enter LMP: 03/2010 Last PAP Result  Specimen Adequacy: Satisfactory for evaluation.   Interpretation/Result:Negative for intraepithelial Lesion or Malignancy.      CC:  follow-up visit 3 month  DM, Depression, and Hypertension Management.  History of Present Illness:  Pt into the office for f/u on diabetes.  Diabetic Neuropathy - Pt did go to the podiatry as ordered.  Pt reports that she was told that her neurontin should be increased  Dermatology - pt did go to dermatology as ordered.  She was given a cream to which pt reports she was allergic.  Fluid gain - pt seems obsessed with fluid gain.  She is on furosemide but increases her dose at will and pt is requesting an increase in dose today.  "I am constantly retaining fluid" "I can't eat because I will retain flud: Pt has been to nephrology as ordered.  Pt is NOT fasting today so unable to check labs.  Tobacco use - ongoing  Daughter present with pt today in office Comments:  pt is still going to Dr. Darlys Gales and also has couseling sessions.  Diabetes Management History:      The patient is a 50 years old female who comes  in for evaluation of Type 1 Diabetes Mellitus.  She has not been enrolled in the "Diabetic Education Program".  She states understanding of dietary principles but she is not following the appropriate diet.  No sensory loss is reported.  Self foot exams are not being performed.  She is checking home blood sugars.  She says that she is exercising.  Duration of exercise is estimated to be 30-60  She is doing this 5 times per week.        Hypoglycemic symptoms are not occurring.  No hyperglycemic symptoms are reported.  Other comments include: pt notes that her blood sugar has been fluctuating and has been high. She has been making some changes to her insulin.        The following changes have been made to her treatment plan since last visit: insulin dosing.  Treatment plan changes were initiated by patient.    Hypertension History:      She denies headache, chest pain, and palpitations.  She notes no problems with any antihypertensive medication side effects.        Positive major cardiovascular risk factors include diabetes, hypertension, and current tobacco user.  Negative major cardiovascular risk factors include female age less than 40 years old and negative family history for ischemic heart disease.  Positive history for target organ damage include renal insufficiency.  Further assessment for target organ damage reveals no history of ASHD, cardiac end-organ damage (CHF/LVH), stroke/TIA, peripheral vascular disease, or hypertensive retinopathy.      Habits & Providers  Alcohol-Tobacco-Diet     Alcohol drinks/day: 0     Tobacco Status: current     Tobacco Counseling: to quit use of tobacco products     Cigarette Packs/Day: 11/2  Exercise-Depression-Behavior     Does Patient Exercise: yes     Exercise Counseling: not indicated; exercise is adequate     Type of exercise: N/A     Exercise (avg: min/session): 30-60     Times/week: 5     Depression Counseling: further diagnostic testing  and/or other treatment is indicated     Drug Use: past     Seat Belt Use: 100     Sun Exposure: occasionally  Medications Prior to Update: 1)  Lasix 40 Mg Tabs (Furosemide) .... One and 1/2 Tablet By Mouth Three Times A Day For Fluid 2)  Lantus 100 Unit/ml  Soln (Insulin Glargine) .... 23 Units Subcutaneously Every Evening 3)  Humalog 100 Unit/ml  Soln (Insulin Lispro (Human)) .... Take As Directed Accordking To Sliding Scale 4)  Valtrex 500 Mg Tabs (Valacyclovir Hcl) .Marland Kitchen.. 1 Tablet By Mouth Daily 5)  Gabapentin 600 Mg Tabs (Gabapentin) .Marland Kitchen.. 1 Tablet By Mouth Three Times A Day 6)  Ultram 50 Mg Tabs (Tramadol Hcl) .... One Tablet By Mouth Two Times A Day As Needed For Pain 7)  Vistaril 50 Mg Caps (Hydroxyzine Pamoate) .Marland Kitchen.. 1 Capsule By Mouth Three Times A Day As Needed 8)  Seroquel Xr 300 Mg Xr24h-Tab (Quetiapine Fumarate) .Marland Kitchen.. 1 Tablet By Mouth Daily 9)  Omeprazole 20 Mg Tbec (Omeprazole) .Marland Kitchen.. 1 Capsule By Mouth Daily For Stomach 10)  Lancets  Misc (Lancets) .... Dx 250.00 Check Blood Sugar Three Times Daily 11)  Prodigy Blood Glucose Test  Strp (Glucose Blood) .... Check Blood Sugar Four Times Daily 12)  Klonopin 1 Mg Tabs (Clonazepam) .... Take One Tablet 3 Times A Day *dr Betti Cruz 13)  Advair Diskus 100-50 Mcg/dose Misc (Fluticasone-Salmeterol) .Marland Kitchen.. 1 Inhalation Two Times A Day  **rinse Mouth After Use** 14)  Prodigy Lancing Device  Misc (Lancet Devices) .... Dispense To Use With Lancets 15)  Prodigy Twist Top Lancets 28g  Misc (Lancets) .... Check Blood Sugar Four Times Daily 16)  Prodigy Blood Glucose Monitor  Devi (Blood Glucose Monitoring Suppl) .... Dispense Glucometer 17)  Suboxone 8-2 Mg Subl (Buprenorphine Hcl-Naloxone Hcl) .... One Tablet By Mouth Three Times A Day  **dx By Dr. Manson Passey** 18)  Polyethylene Glycol  Powd (Polyethylene Glycol 1450) .Marland Kitchen.. 17gm With A  Full Glass of Water Daily 19)  Wrist Splint  Misc (Elastic Bandages & Supports) .... Get Right Wrist Splint Dx - Peripheral  Neuropathy 20)  Knee Support  Misc (Elastic Bandages & Supports) .... Right Knee Support Size To Fit Dx: 719.46 21)  Pristiq 50 Mg Xr24h-Tab (Desvenlafaxine Succinate) .Marland Kitchen.. 1 By Mouth Qam 22)  Cortisporin 1 % Oint (Bacit-Poly-Neo Hc) .... One Application Topically Two Times A Day To Affected Area 23)  Loratadine 10 Mg Tabs (Loratadine) .... One Tablet By Mouth Daily As Needed For Allergies 24)  Zetia 10 Mg Tabs (Ezetimibe) .... One Tablet By Mouth Daily For Cholesterol **prior Approval Granted**  Allergies (verified): No Known Drug Allergies  Review of Systems General:  Denies loss of appetite. CV:  Denies chest pain or  discomfort. Resp:  Denies cough. GI:  Denies abdominal pain, nausea, and vomiting. 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  Physical Exam  General:  alert.   Head:  normocephalic.   Ears:  ear piercing(s) noted.   Lungs:  normal breath sounds.   Heart:  normal rate and regular rhythm.   Msk:  up to the exam table Neurologic:  alert & oriented X3.   Skin:  sun affected area   Impression & Recommendations:  Problem # 1:  DIABETES MELLITUS, TYPE I, ADULT ONSET (ICD-250.01) Will increase lantus to 30units Hgba1c = 7.1 Her updated medication list for this problem includes:    Lantus 100 Unit/ml Soln (Insulin glargine) .Marland Kitchen... 23 units subcutaneously every evening    Humalog 100 Unit/ml Soln (Insulin lispro (human)) .Marland Kitchen... Take as directed accordking to sliding scale  Orders: Capillary Blood Glucose/CBG (82948) Hemoglobin A1C (83036)  Problem # 2:  HYPERTENSION, BENIGN ESSENTIAL (ICD-401.1) BP is stable Her updated medication list for this problem includes:    Lasix 40 Mg Tabs (Furosemide) ..... One and 1/2 tablet by mouth three times a day for fluid  Problem # 3:  SKIN LESION (ICD-709.9) pt has been to dermatology   Problem # 4:  FOOT PAIN, RIGHT (ICD-729.5) pt has been to the podiatrist - will await records to review however will increase  neurontin to 800mg  by mouth three times a day - advised pt that this may cause weight gain  Problem # 5:  OBESITY (ICD-278.00) pt seems to think that weight gain is fluid advised pt that her lasix will not be increased  she needs to work on her exercise and diet  Complete Medication List: 1)  Lasix 40 Mg Tabs (Furosemide) .... One and 1/2 tablet by mouth three times a day for fluid 2)  Lantus 100 Unit/ml Soln (Insulin glargine) .... 23 units subcutaneously every evening 3)  Humalog 100 Unit/ml Soln (Insulin lispro (human)) .... Take as directed accordking to sliding scale 4)  Valtrex 500 Mg Tabs (Valacyclovir hcl) .Marland Kitchen.. 1 tablet by mouth daily 5)  Gabapentin 800 Mg Tabs (Gabapentin) .... One tablet by mouth three times a day  **note change in dose** 6)  Ultram 50 Mg Tabs (Tramadol hcl) .... One tablet by mouth two times a day as needed for pain 7)  Vistaril 50 Mg Caps (Hydroxyzine pamoate) .Marland Kitchen.. 1 capsule by mouth three times a day as needed 8)  Seroquel Xr 300 Mg Xr24h-tab (Quetiapine fumarate) .Marland Kitchen.. 1 tablet by mouth daily 9)  Omeprazole 20 Mg Tbec (Omeprazole) .Marland Kitchen.. 1 capsule by mouth daily for stomach 10)  Lancets Misc (Lancets) .... Dx 250.00 check blood sugar three times daily 11)  Prodigy Blood Glucose Test Strp (Glucose blood) .... Check blood sugar four times daily 12)  Klonopin 1 Mg Tabs (Clonazepam) .... Take one tablet 3 times a day *dr reddy 13)  Advair Diskus 100-50 Mcg/dose Misc (Fluticasone-salmeterol) .Marland Kitchen.. 1 inhalation two times a day  **rinse mouth after use** 14)  Prodigy Lancing Device Misc (Lancet devices) .... Dispense to use with lancets 15)  Prodigy Twist Top Lancets 28g Misc (Lancets) .... Check blood sugar four times daily 16)  Prodigy Blood Glucose Monitor Devi (Blood glucose monitoring suppl) .... Dispense glucometer 17)  Suboxone 8-2 Mg Subl (Buprenorphine hcl-naloxone hcl) .... One tablet by mouth three times a day  **dx by dr. Manson Passey** 18)  Polyethylene Glycol Powd  (Polyethylene glycol 1450) .Marland Kitchen.. 17gm with a  full glass of water  daily 19)  Wrist Splint Misc (Elastic bandages & supports) .... Get right wrist splint dx - peripheral neuropathy 20)  Knee Support Misc (Elastic bandages & supports) .... Right knee support size to fit dx: 719.46 21)  Pristiq 50 Mg Xr24h-tab (Desvenlafaxine succinate) .Marland Kitchen.. 1 by mouth qam 22)  Cortisporin 1 % Oint (Bacit-poly-neo hc) .... One application topically two times a day to affected area 23)  Loratadine 10 Mg Tabs (Loratadine) .... One tablet by mouth daily as needed for allergies 24)  Zetia 10 Mg Tabs (Ezetimibe) .... One tablet by mouth daily for cholesterol **prior approval granted**  Diabetes Management Assessment/Plan:      Her blood pressure goal is < 130/80.    Hypertension Assessment/Plan:      The patient's hypertensive risk group is category C: Target organ damage and/or diabetes.  Her calculated 10 year risk of coronary heart disease is 9 %.  Today's blood pressure is 105/71.  Her blood pressure goal is < 130/80.  Patient Instructions: 1)  Swelling - Will not increase furosemide more that what it is now.  2)  Consider other causes for swelling. 3)  Some medications that may cause edema are neurontin, fast acting insulin and ? Seroquel. 4)  Diabetes - Increase lantus to 30 units subcutaneously night 5)  Hgba1c = 7.1 6)  Follow up in 3 months for diabetes. 7)  Come fasting for labs - will need lipids Prescriptions: GABAPENTIN 800 MG TABS (GABAPENTIN) One tablet by mouth three times a day  **note change in dose**  #90 x 5   Entered and Authorized by:   Lehman Prom FNP   Signed by:   Lehman Prom FNP on 03/22/2010   Method used:   Print then Give to Patient   RxID:   [id number]   Laboratory Results   Blood Tests   Date/Time Received: March 22, 2010   HGBA1C: 7.1%   (Normal Range: Non-Diabetic - 3-6%   Control Diabetic - 6-8%) CBG Random:: 241mg /dL

## 2010-09-05 NOTE — Letter (Signed)
Summary: TEST ORDER FORM //MAMMOGRAM//APPT DATE & TIME  TEST ORDER FORM //MAMMOGRAM//APPT DATE & TIME   Imported By: Arta Bruce 12/14/2009 11:15:15  _____________________________________________________________________  External Attachment:    Type:   Image     Comment:   External Document

## 2010-09-05 NOTE — Miscellaneous (Signed)
Summary: Mammogram order  Clinical Lists Changes  Problems: Added new problem of UNSPECIFIED BREAST SCREENING (ICD-V76.10) Orders: Added new Test order of Mammogram (Screening) (Mammo) - Signed

## 2010-09-05 NOTE — Progress Notes (Signed)
Summary: Nutrition Referral  Phone Note From Other Clinic   Caller: Referral Coordinator Summary of Call: I don't see where the Pt was ever sched @ [facility] Nutrition.Need a new order Initial call taken by: Candi Leash,  October 24, 2009 11:00 AM  Follow-up for Phone Call        Just looked - previous order done 06/22/2009 and was completed but apparently pt never went.  Just made new order for nutrition evaluation Follow-up by: Lehman Prom FNP,  October 24, 2009 12:53 PM

## 2010-09-05 NOTE — Progress Notes (Signed)
Summary: WENT TO ER/ NEEDS MEDICATION  Phone Note Call from Patient Call back at Home Phone [phone]   Summary of Call: MARTIN PT MS Stclair CALLED TO LET YOU KNOW THAT SHE WENT TO [place] YESTERDAY AFTERNOON, SHE WAS GIVEN POTASSIUM AND FLUID MEDICATION, AND TOLD HER TO CALL AND LET YOU KNOW THAT SHE NEEDS A LOOP DIURETIC MED CALLED IN.  [facility] [address] Initial call taken by: Leodis Rains,  April 16, 2010 10:50 AM  Follow-up for Phone Call        ED reports put on provider's desk.  Dutch Quint RN  April 16, 2010 11:19 AM   Additional Follow-up for Phone Call Additional follow up Details #1::        Did she tell them what medications she is already taking? She is already on a LOOP diuretic (lasix) of which she does not take according to the direction of the provider.  Will continue that according to current dose.  She likely needed potassium because the lasix she already takes depleted her supply.  Provider has spoken with pt several times before that she should take lasix as ordered **In reading ER reports actually what was said is that lasix is causing renal insufficiency and that she should hold her lasix dose. Additional Follow-up by: Lehman Prom FNP,  April 16, 2010 11:36 AM    Additional Follow-up for Phone Call Additional follow up Details #2::    So, should she hold her Lasix?  I'm not sure what you want me to tell her.  Dutch Quint RN  April 16, 2010 4:00 PM  I've had several conversations with pt about lasix.  Just tell her to take it as previously advised. review with her that lasix is a loop diuretic. n.martin,fnp April 16, 2010  5:57 PM  Pt. advised as per provider's instructions.  Discussed Lasix dosage and frequency.  Pt. states that she was also taking lasix that had belonged to someone else -- advised pt. that interchanging medications is a bad practice -- pt. verbalized understanding.  Confirmed appt. for 06/24/10.   Dutch Quint RN  April 17, 2010 9:42 AM

## 2010-09-05 NOTE — Progress Notes (Signed)
Summary: REFILL  Phone Note Call from Patient Call back at Home Phone [phone] Call back at [phone]   Reason for Call: Refill Medication Summary of Call: The pt needs more test strip and ultram refills. ([facility]) Daphine Deutscher FnP Initial call taken by: Manon Hilding,  Dec 10, 2009 3:39 PM  Follow-up for Phone Call        MS Thunder CALLED TO CHECK ON HER TEST STRIPS Follow-up by: Leodis Rains,  Dec 11, 2009 10:54 AM  Additional Follow-up for Phone Call Additional follow up Details #1::        forward to N. Daphine Deutscher, FNP Additional Follow-up by: Levon Hedger,  Dec 11, 2009 12:35 PM    Additional Follow-up for Phone Call Additional follow up Details #2::    [facility] to [facility] notify pt Follow-up by: Lehman Prom FNP,  Dec 11, 2009 2:27 PM  Additional Follow-up for Phone Call Additional follow up Details #3:: Details for Additional Follow-up Action Taken: PT INFORMED. Additional Follow-up by: Levon Hedger,  Dec 11, 2009 5:32 PM  Prescriptions: ULTRAM 50 MG TABS (TRAMADOL HCL) Take 1-2 tablets by mouth every 8 hours as needed for back pain  #60 x 0   Entered and Authorized by:   Lehman Prom FNP   Signed by:   Lehman Prom FNP on 12/11/2009   Method used:   Electronically to        [facility]  [phone]* (retail)       [address] [place], [place]  [address]       Ph: [phone] or [phone]       Fax: [phone]   RxID:   [id number] PRODIGY BLOOD GLUCOSE TEST  STRP (GLUCOSE BLOOD) check blood sugar four times daily  #1 month qs x 5   Entered and Authorized by:   Lehman Prom FNP   Signed by:   Lehman Prom FNP on 12/11/2009   Method used:   Electronically to        [facility]  [phone]* (retail)       [address] [place], [place]  [address]       Ph: [phone] or [phone]       Fax: [phone]   RxID:   [id number]

## 2010-09-05 NOTE — Progress Notes (Signed)
Summary: Refills Request  Phone Note Call from Patient   Summary of Call: pt needs refills lantus, humolog,and  gabapentin.  (Careers adviser).  Pt is living out of town this evening. [facility] FNP Initial call taken by: Manon Hilding,  February 08, 2010 12:08 PM  Follow-up for Phone Call        Left message on answering machine to return call.  Dutch Quint RN  February 08, 2010 2:18 PM  Per pharmacy, pt. received refills.  Dutch Quint RN  February 11, 2010 3:17 PM     Prescriptions: HUMALOG 100 UNIT/ML  SOLN (INSULIN LISPRO (HUMAN)) take as directed accordking to sliding scale  #1 month x 4   Entered by:   Dutch Quint RN   Authorized by:   Lehman Prom FNP   Signed by:   Dutch Quint RN on 02/08/2010   Method used:   Electronically to        [facility]  [phone]* (retail)       [address] [place], [place]  [address]       Ph: [phone] or [phone]       Fax: [phone]   RxID:   [id number] LANTUS 100 UNIT/ML  SOLN (INSULIN GLARGINE) 23 units Subcutaneously every evening  #1 month qs x 4   Entered by:   Dutch Quint RN   Authorized by:   Lehman Prom FNP   Signed by:   Dutch Quint RN on 02/08/2010   Method used:   Electronically to        [facility]  [phone]* (retail)       [address] [place], [place]  [address]       Ph: [phone] or [phone]       Fax: [phone]   RxID:   [id number]

## 2010-09-05 NOTE — Progress Notes (Signed)
Summary: Office Visit//DEPRESSION SCREENING  Office Visit//DEPRESSION SCREENING   Imported By: Arta Bruce 12/10/2009 16:43:51  _____________________________________________________________________  External Attachment:    Type:   Image     Comment:   External Document

## 2010-09-05 NOTE — Progress Notes (Signed)
Summary: Hands and eyes very puffy  Phone Note Call from Patient   Summary of Call: pt called saying [facility]kedtra changed her lasix to twice a day and she is waking up in the mornings with her hands and eyes very puffy please give her a call back  [phone] Initial call taken by: Arta Bruce,  July 31, 2010 12:15 PM  Follow-up for Phone Call        Nephrologist was actually who changed her lasix based on her kidney function will not increase the lasix. she should be taking lortadine 10mg  daily for allergies which may help some  Follow-up by: Lehman Prom FNP,  July 31, 2010 5:15 PM  Additional Follow-up for Phone Call Additional follow up Details #1::        States Lasix 60 mg. three times a day was effective.  Now, she says she can't eat because her abdomen is so full of fluid, hands are swollen.  Has gained 10 lbs back in two weeks.  States she was supposed to get labs done, including cholesterol, the last time she was here -- was late in the day.  States dosage of lasix she's getting now isn't effective.  Says she might have to get lasix "off the street".  Advised against that practice.  Says she doesn't have any allergy problems -- she knows the difference.  Dutch Quint RN  July 31, 2010 5:52 PM     Additional Follow-up for Phone Call Additional follow up Details #2::    I have reviewed this several times with pt I will NOT increase her lasix.  she should stay on current dose.  If she wants verification of this then she can contact nephrology who decreased her lasix due to the increased work it was doing on her kidneys. she should examine her diet over the past 2 weeks, with it being the holiday season she may have been eating things that she does not normally eat, high sodium, fast foods, etc which may have caused some additional swelling.  she should restart her normal eating habits and see if swelling decreases. Follow-up by: Lehman Prom FNP,  August 01, 2010 8:22 AM  Additional Follow-up for Phone Call Additional follow up Details #3:: Details for Additional Follow-up Action Taken: ms Fogleman called and says that she has her kidney Dr # if we need to call them, they were doing her potassium level, and not her kidney function, you can talk to deborah at [facility] at [phone] ext 141.Cala Bradford Tinnin  August 01, 2010 11:14 AM  No, I don't need to call them.  I received the last correspondence from that office with the order regarding her lasix n.martin,fnp August 01, 2010 12:42 PM

## 2010-09-05 NOTE — Letter (Signed)
Summary: *HSN Results Follow up  [facility]  [address] [place], [place] [address]   Phone: [phone]  Fax: [phone]      07/30/2010   [facility] [address] [place], [place]  [address]   Dear  Ms. Desirey Monforte,                            ____S.Drinkard,FNP   ____D. Gore,FNP       ____B. McPherson,MD   ____V. Rankins,MD    ____E. Mulberry,MD    ____N. Daphine Deutscher, FNP  ____D. Reche Dixon, MD    ____K. Philipp Deputy, MD    ____Other     This letter is to inform you that your recent test(s):  _______Pap Smear    _______Lab Test     _______X-ray    _______ is within acceptable limits  _______ requires a medication change  _______ requires a follow-up lab visit  _______ requires a follow-up visit with your provider   Comments:  We have tried to contact you at [phone].  Please contact the office at your earliest convenience.       _________________________________________________________ If you have any questions, please contact our office                     Sincerely,  Levon Hedger [facility]

## 2010-09-05 NOTE — Progress Notes (Signed)
Summary: Left neck pain, swollen lymph nodes  Phone Note Call from Patient Call back at cell: [phone]   Summary of Call: pt says she has a swollen lymph node gland.... pt says if she turn a certain way she has a stabbing pain and if the other way its a pain that touches a bone near her ear and her ear hurts.... pt says the left ear hurts..  pt says tyenlol helps alittle... Initial call taken by: [place] Shannon,  August 27, 2010 9:57 AM  Follow-up for Phone Call        Called on 1/18 with same problem -- states swelling goes down during the day, then is worse when she gets up in the morning.   Hurts inside left ear, no change in ear pain, but pain hasn't gone away.  Has to be careful how she moves her neck/head because it increases pain.  Says other side is starting to be affected, may be "compensating".  Denies fever, cough, nasal congestion or drainage.  Not Claritin-D because it dries her out.  Has tried gargling with salt water, warm compresses, Vistaril -- nothing helps.  Wants to know what else she should do.  Has gotten to the point that she can hardly drink and is eating soft foods.  States it's been going on for three weeks.  Tylenol is not helping pain, tramadol not effective.   Is currently in [place], can stay with mother limited time due to eviction issues.  Advised of no appointment availability - states she will go to UC for evaluation and treatment, will call us back to let us know dx. Follow-up by: Dutch Quint RN,  August 27, 2010 4:33 PM  Additional Follow-up for Phone Call Additional follow up Details #1::        attempted to call pt - spoke with pt's mother, pt is not there but she will call pt and see if she has already gone to UC. If not, provider will call pt medication into the pharmacy.  I will call mother back at 6:15PM Additional Follow-up by: Lehman Prom FNP,  August 27, 2010 6:03 PM    Additional Follow-up for Phone Call Additional follow up Details #2::    will  send rx to the pharmacy Follow-up by: Lehman Prom FNP,  August 27, 2010 6:20 PM  New/Updated Medications: BACTRIM DS 800-160 MG TABS (SULFAMETHOXAZOLE-TRIMETHOPRIM) ONe tablet by mouth two times a day for infection Prescriptions: BACTRIM DS 800-160 MG TABS (SULFAMETHOXAZOLE-TRIMETHOPRIM) ONe tablet by mouth two times a day for infection  #20 x 0   Entered and Authorized by:   Lehman Prom FNP   Signed by:   Lehman Prom FNP on 08/27/2010   Method used:   [facility]  [address] [address] [id number]       [address]       [place], [place]  [address]       Ph: [phone] or [phone]       Fax: [phone]   RxID:   [id number]

## 2010-09-05 NOTE — Assessment & Plan Note (Signed)
Summary: Diabetes   Vital Signs:  Patient profile:   50 year old female Menstrual status:  irregular LMP:     08/2010 Weight:      204.5 pounds BMI:     34.15 Temp:     97.2 degrees F oral Pulse rate:   84 / minute Pulse rhythm:   regular Resp:     20 per minute BP sitting:   116 / 80  (left arm) Cuff size:   regular  Vitals Entered By: Levon Hedger (August 20, 2010 10:53 AM)  Nutrition Counseling: Patient's BMI is greater than 25 and therefore counseled on weight management options. CC: Hypertension Management, Lipid Management Is Patient Diabetic? Yes CBG Result 15 CBG Device ID A  Does patient need assistance? Functional Status Self care Ambulation Normal LMP (date): 08/2010 LMP - Character: normal     Enter LMP: 08/2010 Last PAP Result  Specimen Adequacy: Satisfactory for evaluation.   Interpretation/Result:Negative for intraepithelial Lesion or Malignancy.      CC:  Hypertension Management and Lipid Management.  History of Present Illness:  Pt very confused and disoriented at start of work up 10:45AM. Vitals obtained and pt taken to Room 3 Blood sugar 15 when taken during work up Diaphroretic. Pt able to answer to her name and is conscious. RN x 2 in the room to attempt IV - access not obtained Glucose gel administered as pt is able to swallow. EMS notified. Blood sugar rechecked and is 23 at 11:29 One on One care by Gaylyn Cheers, RN and provider from 11:05 until 12:05.  EMS arrived at 11:34 - asssessment done as per their protocol D5W started Blood sugar is 29 at 11:39 (checked by EMS) Glucagon administered by EMS at 11:43 Pt becoming more alert and admits that she took her insulin and this morning despite her fasting status for labs. Blood sugar is 53 at 11:46 Blood sugar at 108 at 12:05  Emergency contact called (mother) she requested that this office call her back once  plan (ie going to ER or staying in office) is determined. pt refused to  go with EMS for additional treatment  Pt alert and conversive during after blood sugar is elevated  Hypertension History:      She denies headache, chest pain, and palpitations.  She notes no problems with any antihypertensive medication side effects.        Positive major cardiovascular risk factors include diabetes, hyperlipidemia, hypertension, and current tobacco user.  Negative major cardiovascular risk factors include female age less than 70 years old and negative family history for ischemic heart disease.        Positive history for target organ damage include renal insufficiency.  Further assessment for target organ damage reveals no history of ASHD, cardiac end-organ damage (CHF/LVH), stroke/TIA, peripheral vascular disease, or hypertensive retinopathy.    Lipid Management History:      Positive NCEP/ATP III risk factors include diabetes, current tobacco user, and hypertension.  Negative NCEP/ATP III risk factors include female age less than 77 years old, no history of early menopause without estrogen hormone replacement, no family history for ischemic heart disease, no ASHD (atherosclerotic heart disease), no prior stroke/TIA, and no peripheral vascular disease.        The patient states that she does not know about the "Therapeutic Lifestyle Change" diet.  The patient does not know about adjunctive measures for cholesterol lowering.  Comments include: pt is taking zetia as ordered.    Allergies: No Known Drug  Allergies  Review of Systems General:  Complains of sweats. Eyes:  recurrent corneal abrasion. ENT:  Complains of sore throat; c/o swollen gland - for the past 3 days. CV:  Complains of swelling of feet and swelling of hands; Pt with c/o swelling in her hands and feet.  This has been ongoing debate about her dose of lasix.  Pt has been to nephrology and has been advised that lasix dose should not be increased. Neuro:  Complains of difficulty with concentration and  tremors.  Physical Exam  General:  alert.   Head:  normocephalic.   Ears:  ear piercing(s) noted.   Mouth:  poor dentition.  no molars on left lower Lungs:  normal breath sounds.   Heart:  normal rate and regular rhythm.   Cervical Nodes:  left mandibular LAD - tender   Impression & Recommendations:  Problem # 1:  DIABETES MELLITUS, TYPE I, ADULT ONSET (ICD-250.01) hypoglycemic epsidose today in office Blood sugar 15 Her updated medication list for this problem includes:    Lantus 100 Unit/ml Soln (Insulin glargine) .Marland Kitchen... 23 units subcutaneously every evening    Humalog 100 Unit/ml Soln (Insulin lispro (human)) .Marland Kitchen... Take as directed accordking to sliding scale  Orders: Capillary Blood Glucose/CBG ([address])  Problem # 2:  LYMPHADENOPATHY (ICD-785.6) advised pt that this will likely resolve with time The following medications were removed from the medication list:    Amoxicillin 500 Mg Tabs (Amoxicillin) ..... One tablet by mouth three times a day for infection  Problem # 3:  HYPERTENSION, BENIGN ESSENTIAL (ICD-401.1)  Her updated medication list for this problem includes:    Lasix 40 Mg Tabs (Furosemide) ..... One and 1/2 tablet by mouth two times a day  Problem # 4:  CORNEAL ABRASION, RIGHT (ICD-918.1) advised pt that she will need to tape eye shut when she has symptoms Asymptomatic today  Problem # 5:  HYPERLIPIDEMIA (ICD-272.4) unable to check labs as pt is no longer fasting -given peanut butter crackers to increase blood sugar Her updated medication list for this problem includes:    Zetia 10 Mg Tabs (Ezetimibe) ..... One tablet by mouth daily for cholesterol **prior approval granted**  Complete Medication List: 1)  Lasix 40 Mg Tabs (Furosemide) .... One and 1/2 tablet by mouth two times a day 2)  Lantus 100 Unit/ml Soln (Insulin glargine) .... 23 units subcutaneously every evening 3)  Humalog 100 Unit/ml Soln (Insulin lispro (human)) .... Take as directed accordking  to sliding scale 4)  Valtrex 500 Mg Tabs (Valacyclovir hcl) .Marland Kitchen.. 1 tablet by mouth daily 5)  Gabapentin 800 Mg Tabs (Gabapentin) .... One tablet by mouth three times a day  **note change in dose** 6)  Ultram 50 Mg Tabs (Tramadol hcl) .... One tablet by mouth three times a day as needed for pain 7)  Vistaril 50 Mg Caps (Hydroxyzine pamoate) .Marland Kitchen.. 1 capsule by mouth three times a day as needed 8)  Seroquel Xr 300 Mg Xr24h-tab (Quetiapine fumarate) .Marland Kitchen.. 1 tablet by mouth daily 9)  Omeprazole 20 Mg Tbec (Omeprazole) .Marland Kitchen.. 1 capsule by mouth daily for stomach 10)  Lancets Misc (Lancets) .... Dx 250.00 check blood sugar three times daily 11)  Prodigy Blood Glucose Test Strp (Glucose blood) .... Check blood sugar four times daily 12)  Valium 10 Mg Tabs (Diazepam) .... One tablet by mouth two times a day **rx by pysch** 13)  Advair Diskus 100-50 Mcg/dose Misc (Fluticasone-salmeterol) .Marland Kitchen.. 1 inhalation two times a day  **rinse mouth after  use** 14)  Prodigy Lancing Device Misc (Lancet devices) .... Dispense to use with lancets 15)  Prodigy Twist Top Lancets 28g Misc (Lancets) .... Check blood sugar four times daily 16)  Prodigy Blood Glucose Monitor Devi (Blood glucose monitoring suppl) .... Dispense glucometer 17)  Polyethylene Glycol Powd (Polyethylene glycol 1450) .Marland Kitchen.. 17gm with a  full glass of water daily 18)  Wrist Splint Misc (Elastic bandages & supports) .... Get right wrist splint dx - peripheral neuropathy 19)  Knee Support Misc (Elastic bandages & supports) .... Right knee support size to fit dx: 719.46 20)  Pristiq 50 Mg Xr24h-tab (Desvenlafaxine succinate) .Marland Kitchen.. 1 by mouth qam 21)  Loratadine 10 Mg Tabs (Loratadine) .... One tablet by mouth daily as needed for allergies 22)  Zetia 10 Mg Tabs (Ezetimibe) .... One tablet by mouth daily for cholesterol **prior approval granted** 23)  Diclofenac Sodium 75 Mg Tbec (Diclofenac sodium) .... One tablet by mouth two times a day as needed for knee  pain  Hypertension Assessment/Plan:      The patient's hypertensive risk group is category C: Target organ damage and/or diabetes.  Her calculated 10 year risk of coronary heart disease is 15 %.  Today's blood pressure is 116/80.  Her blood pressure goal is < 130/80.  Lipid Assessment/Plan:      Based on NCEP/ATP III, the patient's risk factor category is "history of diabetes".  The patient's lipid goals are as follows: Total cholesterol goal is 200; LDL cholesterol goal is 100; HDL cholesterol goal is 40; Triglyceride goal is 150.      Patient Instructions: 1)  Left lymph node- likely due to viral illness 2)  This should resolve over the next 3-5 days. apply warm compresses. 3)  If not then inform this provider if it gets larger or worsens. 4)  Diabetes - Your labs will be checked today 5)  Be sure if you have to come into this office fasting then do NOT take your insulin 6)  Fluid - Lasix will NOT be increased.  Your metabolism would increase if you ate small meals three times per day. This will help you lose weight.  Try to eat small portions such as fresh fruit and veges.  keep in mind that Nutella is very sweet. This will raise your blood sugar over time. 7)  Pain medication - will increase ultram to 90 tablets per month.  Keep in mind that this amount must last you the entire month. 8)  Schedule an appointment for a fasting lab visit - lipids (272.0) and CMET (401.1) and cbc (281.9) and tsh (278.00) 9)  Follow up in 2 months for diabetes and blood pressure. Prescriptions: ULTRAM 50 MG TABS (TRAMADOL HCL) One tablet by mouth three times a day as needed for pain  #90 x 0   Entered and Authorized by:   Lehman Prom FNP   Signed by:   Lehman Prom FNP on 08/20/2010   Method used:   Print then Give to Patient   RxID:   [id number]    Orders Added: 1)  Capillary Blood Glucose/CBG [82948] 2)  Est. Patient Level IV [14782] 3)  Capillary Blood Glucose/CBG [82948] 4)   Capillary Blood Glucose/CBG [82948]    Laboratory Results   Blood Tests   Date/Time Received: August 20, 2010   CBG Random:: 15mg /dL

## 2010-09-05 NOTE — Progress Notes (Signed)
Summary: Query:  Refill Omeprazole?  Phone Note Outgoing Call   Summary of Call: Do you want her Omeprazole refilled? Rx written 03/07/09 --  last fill date 03/01/10; last seen 03/22/10. Initial call taken by: Dutch Quint RN,  April 04, 2010 10:59 AM  Follow-up for Phone Call        yes, ok to refill Follow-up by: Lehman Prom FNP,  April 04, 2010 11:41 AM    Prescriptions: OMEPRAZOLE 20 MG TBEC (OMEPRAZOLE) 1 capsule by mouth daily for stomach  #30 x 4   Entered by:   Dutch Quint RN   Authorized by:   Lehman Prom FNP   Signed by:   Dutch Quint RN on 04/04/2010   Method used:   [phone][facility] (retail[facility]       [address] [place], [place]  [address]       Ph: [phone] or [phone]       Fax: [phone]   RxID:   [id number]

## 2010-09-05 NOTE — Assessment & Plan Note (Signed)
Summary: Foot pain   Vital Signs:  Patient profile:   50 year old female Menstrual status:  irregular Height:      65 inches Weight:      196 pounds BMI:     32.73 Temp:     98.0 degrees F oral Pulse rate:   83 / minute Pulse rhythm:   regular Resp:     20 per minute BP sitting:   111 / 73  (left arm) Cuff size:   regular  Vitals Entered By: [place] Shannon (Dec 20, 2009 8:36 AM) CC: pt is here because she has a nerve on her right feet that hurts her, Hypertension Management Is Patient Diabetic? Yes CBG Result 108  Does patient need assistance? Functional Status Self care Ambulation Normal   CC:  pt is here because she has a nerve on her right feet that hurts her and Hypertension Management.  History of Present Illness:  Pt into the office with complaints of pain in right foot. Pain started about 2 months ago. Right > Left no additional swelling in foot no change in color neuropathy at baseline but this pain is different. Pt wears laced up shoes today in office and has been wearing for the past 3 months  CPE - done on last visit Mammogram scheduled but pt did not keep appt - no excuse she just did not go      Diabetes Management History:      The patient is a 50 years old female who comes in for evaluation of Type 1 Diabetes Mellitus.  She has not been enrolled in the "Diabetic Education Program".  She is checking home blood sugars.  She says that she is exercising.  Duration of exercise is estimated to be 30-60  She is doing this 5 times per week.    Hypertension History:      She denies headache, chest pain, and palpitations.  She notes no problems with any antihypertensive medication side effects.        Positive major cardiovascular risk factors include diabetes, hypertension, and current tobacco user.  Negative major cardiovascular risk factors include female age less than 58 years old and negative family history for ischemic heart disease.        Positive  history for target organ damage include renal insufficiency.  Further assessment for target organ damage reveals no history of ASHD, cardiac end-organ damage (CHF/LVH), stroke/TIA, peripheral vascular disease, or hypertensive retinopathy.      Current Medications (verified): 1)  Lasix 40 Mg Tabs (Furosemide) .... One and 1/2 Tablet By Mouth Three Times A Day For Fluid 2)  Lantus 100 Unit/ml  Soln (Insulin Glargine) .... 23 Units Subcutaneously Every Evening 3)  Humalog 100 Unit/ml  Soln (Insulin Lispro (Human)) .... Take As Directed Accordking To Sliding Scale 4)  Valtrex 500 Mg Tabs (Valacyclovir Hcl) .Marland Kitchen.. 1 Tablet By Mouth Daily 5)  Gabapentin 600 Mg Tabs (Gabapentin) .Marland Kitchen.. 1 Tablet By Mouth Three Times A Day 6)  Ultram 50 Mg Tabs (Tramadol Hcl) .... Take 1-2 Tablets By Mouth Every 8 Hours As Needed For Back Pain 7)  Vistaril 50 Mg Caps (Hydroxyzine Pamoate) .Marland Kitchen.. 1 Capsule By Mouth Three Times A Day As Needed 8)  Seroquel Xr 300 Mg Xr24h-Tab (Quetiapine Fumarate) .Marland Kitchen.. 1 Tablet By Mouth Daily 9)  Omeprazole 20 Mg Tbec (Omeprazole) .Marland Kitchen.. 1 Capsule By Mouth Daily For Stomach 10)  Lancets  Misc (Lancets) .... Dx 250.00 Check Blood Sugar Three Times Daily 11)  Prodigy Blood Glucose Test  Strp (Glucose Blood) .... Check Blood Sugar Four Times Daily 12)  Klonopin 1 Mg Tabs (Clonazepam) .... Take One Tablet 3 Times A Day *dr Betti Cruz 13)  Advair Diskus 100-50 Mcg/dose Misc (Fluticasone-Salmeterol) .Marland Kitchen.. 1 Inhalation Two Times A Day  **rinse Mouth After Use** 14)  Prodigy Lancing Device  Misc (Lancet Devices) .... Dispense To Use With Lancets 15)  Prodigy Twist Top Lancets 28g  Misc (Lancets) .... Check Blood Sugar Four Times Daily 16)  Prodigy Blood Glucose Monitor  Devi (Blood Glucose Monitoring Suppl) .... Dispense Glucometer 17)  Suboxone 8-2 Mg Subl (Buprenorphine Hcl-Naloxone Hcl) .... One Tablet By Mouth Three Times A Day  **dx By Dr. Manson Passey** 18)  Polyethylene Glycol  Powd (Polyethylene Glycol 1450)  .Marland Kitchen.. 17gm With A  Full Glass of Water Daily 19)  Wrist Splint  Misc (Elastic Bandages & Supports) .... Get Right Wrist Splint Dx - Peripheral Neuropathy 20)  Knee Support  Misc (Elastic Bandages & Supports) .... Right Knee Support Size To Fit Dx: 719.46 21)  Pristiq 50 Mg Xr24h-Tab (Desvenlafaxine Succinate) .Marland Kitchen.. 1 By Mouth Qam 22)  Cortisporin 1 % Oint (Bacit-Poly-Neo Hc) .... One Application Topically Two Times A Day To Affected Area 23)  Loratadine 10 Mg Tabs (Loratadine) .... One Tablet By Mouth Daily As Needed For Allergies 24)  Zetia 10 Mg Tabs (Ezetimibe) .... One Tablet By Mouth Daily For Cholesterol **prior Approval Granted**  Allergies (verified): No Known Drug Allergies  Diabetic Foot Exam Pulse Check          Right Foot          Left Foot Dorsalis Pedis:        diminished            diminished    10-g (5.07) Semmes-Weinstein Monofilament Test Performed by: Armenia Shannon          Right Foot          Left Foot Visual Inspection                Review of Systems General:  Complains of fatigue. CV:  Denies chest pain or discomfort. Resp:  Denies cough. GI:  Denies abdominal pain, nausea, and vomiting. GU:  needs refills on valtrex - last outbreak was over 1 year ago. She is taking daily suppressive medications. Neuro:  Complains of numbness; left numbness and pain.  Physical Exam  General:  alert.   Head:  normocephalic.   Extremities:  trace left pedal edema and trace right pedal edema.   Neurologic:  alert & oriented X3.    Diabetes Management Exam:    Foot Exam (with socks and/or shoes not present):       Sensory-Monofilament:          Left foot: normal          Right foot: normal       Inspection:          Left foot: abnormal             Comments: callous           Right foot: abnormal             Comments: callous       Nails:          Left foot: normal          Right foot: normal  Diabetic Foot Exam Pulse Check          Right Foot  Left  Foot Dorsalis Pedis:        diminished            diminished    10-g (5.07) Semmes-Weinstein Monofilament Test Performed by: Arm[place] Shannon          Right Foot          Left Foot Visual Inspection                 Impression & Recommendations:  Problem # 1:  FOOT PAIN, RIGHT (ICD-729.5) advised pt that pain may be due to nerve compression on the feet change shoes take aleve  Problem # 2:  DIABETES MELLITUS, TYPE I, ADULT ONSET (ICD-250.01) stable at this time Her updated medication list for this problem includes:    Lantus 100 Unit/ml Soln (Insulin glargine) .Marland Kitchen... 23 units subcutaneously every evening    Humalog 100 Unit/ml Soln (Insulin lispro (human)) .Marland Kitchen... Take as directed accordking to sliding scale  Complete Medication List: 1)  Lasix 40 Mg Tabs (Furosemide) .... One and 1/2 tablet by mouth three times a day for fluid 2)  Lantus 100 Unit/ml Soln (Insulin glargine) .... 23 units subcutaneously every evening 3)  Humalog 100 Unit/ml Soln (Insulin lispro (human)) .... Take as directed accordking to sliding scale 4)  Valtrex 500 Mg Tabs (Valacyclovir hcl) .Marland Kitchen.. 1 tablet by mouth daily 5)  Gabapentin 600 Mg Tabs (Gabapentin) .Marland Kitchen.. 1 tablet by mouth three times a day 6)  Ultram 50 Mg Tabs (Tramadol hcl) .... One tablet by mouth two times a day as needed for pain 7)  Vistaril 50 Mg Caps (Hydroxyzine pamoate) .Marland Kitchen.. 1 capsule by mouth three times a day as needed 8)  Seroquel Xr 300 Mg Xr24h-tab (Quetiapine fumarate) .Marland Kitchen.. 1 tablet by mouth daily 9)  Omeprazole 20 Mg Tbec (Omeprazole) .Marland Kitchen.. 1 capsule by mouth daily for stomach 10)  Lancets Misc (Lancets) .... Dx 250.00 check blood sugar three times daily 11)  Prodigy Blood Glucose Test Strp (Glucose blood) .... Check blood sugar four times daily 12)  Klonopin 1 Mg Tabs (Clonazepam) .... Take one tablet 3 times a day *dr reddy 13)  Advair Diskus 100-50 Mcg/dose Misc (Fluticasone-salmeterol) .Marland Kitchen.. 1 inhalation two times a day  **rinse mouth  after use** 14)  Prodigy Lancing Device Misc (Lancet devices) .... Dispense to use with lancets 15)  Prodigy Twist Top Lancets 28g Misc (Lancets) .... Check blood sugar four times daily 16)  Prodigy Blood Glucose Monitor Devi (Blood glucose monitoring suppl) .... Dispense glucometer 17)  Suboxone 8-2 Mg Subl (Buprenorphine hcl-naloxone hcl) .... One tablet by mouth three times a day  **dx by dr. Manson Passey** 18)  Polyethylene Glycol Powd (Polyethylene glycol 1450) .Marland Kitchen.. 17gm with a  full glass of water daily 19)  Wrist Splint Misc (Elastic bandages & supports) .... Get right wrist splint dx - peripheral neuropathy 20)  Knee Support Misc (Elastic bandages & supports) .... Right knee support size to fit dx: 719.46 21)  Pristiq 50 Mg Xr24h-tab (Desvenlafaxine succinate) .Marland Kitchen.. 1 by mouth qam 22)  Cortisporin 1 % Oint (Bacit-poly-neo hc) .... One application topically two times a day to affected area 23)  Loratadine 10 Mg Tabs (Loratadine) .... One tablet by mouth daily as needed for allergies 24)  Zetia 10 Mg Tabs (Ezetimibe) .... One tablet by mouth daily for cholesterol **prior approval granted**  Diabetes Management Assessment/Plan:      Her blood pressure goal is < 130/80.    Hypertension Assessment/Plan:  The patient's hypertensive risk group is category C: Target organ damage and/or diabetes.  Her calculated 10 year risk of coronary heart disease is 9 %.  Today's blood pressure is 111/73.  Her blood pressure goal is < 130/80.  Patient Instructions: 1)  The pain in your feet is due to nerve compression.  You need to change your shoes. 2)  Also take the aleve - 2 tablets by mouth daily for 3 days then as needed. 3)  There is a nerve study that can be done by Orthopedic. 4)  Mammogram - be advised you need a mammogram. 5)  This office has scheduled for you once and you did not keep the appointment.  When you would like to reschedule inform this office 6)  Weight loss - you have lost 3 pounds  since your last visit.   Prescriptions: VALTREX 500 MG TABS (VALACYCLOVIR HCL) 1 tablet by mouth daily  #30 Tablet x 5   Entered and Authorized by:   Lehman Prom FNP   Signed by:   Lehman Prom FNP on 12/20/2009   Method used:   Electronically to        [facility]  [phone]* (retail)       [address] [place], [place]  [address]       Ph: [phone] or [phone]       Fax: [phone]   RxID:   [id number]   Last LDL:                                                 192 (09/18/2009 9:25:00 PM)        Diabetic Foot Exam Last Podiatry Exam Date: 12/20/2009  Foot Inspection Is there a history of a foot ulcer?              No Is there a foot ulcer now?              No Can the patient see the bottom of their feet?          Yes Are the shoes appropriate in style and fit?          Yes Is there swelling or an abnormal foot shape?          No Are the toenails long?                No Are the toenails thick?                No Are the toenails ingrown?              No Is there heavy callous build-up?              No Is there a claw toe deformity?                          No Is there elevated skin temperature?            No Is there limited ankle dorsiflexion?            No Is there foot or ankle muscle weakness?            Yes Do you have pain in calf while walking?  No      Pulse Check          Right Foot          Left Foot Dorsalis Pedis:        diminished            diminished    10-g (5.07) Semmes-Weinstein Monofilament Test Performed by: Arm[place] Shannon          Right Foot          Left Foot Visual Inspection               Test Control      normal         normal Site 1         normal         normal Site 2         normal         normal Site 3         normal         normal Site 4         normal         normal Site 5         normal         normal Site 6         normal         normal Site 7         normal         normal Site 8          normal         normal Site 9         normal         normal Site 10         normal         normal  Impression      normal         normal

## 2010-09-05 NOTE — Progress Notes (Signed)
Summary: Zetia - Prior approval granted  Phone Note Call from Patient   Summary of Call: Medicaid does not cover Zetia need to change.  [facility] Initial call taken by: Vesta Mixer CMA,  October 02, 2009 11:59 AM  Follow-up for Phone Call        forward to N. Daphine Deutscher, FNP Follow-up by: Levon Hedger,  October 04, 2009 3:16 PM  Additional Follow-up for Phone Call Additional follow up Details #1::        I am aware this medication needs prior approval. it is in the stack waiting to be done Additional Follow-up by: Lehman Prom FNP,  October 08, 2009 8:17 AM    Additional Follow-up for Phone Call Additional follow up Details #2::    prior authorization obtained for 1 year i will re-send rx to pharmacy pt can call to check with them to see when it will be ready Follow-up by: Lehman Prom FNP,  October 09, 2009 4:03 PM  Additional Follow-up for Phone Call Additional follow up Details #3:: Details for Additional Follow-up Action Taken: pt informed. Additional Follow-up by: Levon Hedger,  October 10, 2009 8:31 AM  New/Updated Medications: ZETIA 10 MG TABS (EZETIMIBE) One tablet by mouth daily for cholesterol **Prior approval granted** Prescriptions: ZETIA 10 MG TABS (EZETIMIBE) One tablet by mouth daily for cholesterol **Prior approval granted**  #30 x 11   Entered and Authorized by:   Lehman Prom FNP   Signed by:   Lehman Prom FNP on 10/09/2009   Method used:   Electronically to        [facility]  [phone]* (retail)       [address] [place], [place]  [address]       Ph: [phone] or [phone]       Fax: [phone]   RxID:   [id number]

## 2010-09-05 NOTE — Assessment & Plan Note (Signed)
Summary: Complete Physical Exam   Vital Signs:  Patient profile:   50 year old female Menstrual status:  irregular LMP:     09/2009 Height:      65 inches Weight:      199.0 pounds BMI:     33.24 BSA:     1.98 Temp:     97.8 degrees F oral Pulse rate:   80 / minute Pulse rhythm:   regular Resp:     20 per minute BP sitting:   107 / 74  (left arm) Cuff size:   regular  Vitals Entered By: Levon Hedger (October 11, 2009 11:55 AM) CC: CPP, Hypertension Management Is Patient Diabetic? Yes Pain Assessment Patient in pain? yes     Location: knees Intensity: 7 CBG Result 331 CBG Device ID A  Does patient need assistance? Functional Status Self care Ambulation Normal Comments pt had coffee with cream and sweetner LMP (date): 09/2009 LMP - Character: normal     Menstrual Status irregular Enter LMP: 09/2009   CC:  CPP and Hypertension Management.  History of Present Illness:  Pt into the office today for a complete physical exam  Optho - last exam done by Dr. Shea Evans.  Last exam was over 1 year. Pt aware she needs to call and schedule an appointment.  Dental - no recent dental exam.   Mammogram - never had a mammogram.  no self breast exams at home  PAP - last pap normal no family hx of cervical or ovarian CA  Diabetes Management History:      The patient is a 50 years old female who comes in for evaluation of Type 1 Diabetes Mellitus.  She has not been enrolled in the "Diabetic Education Program".  She notes lack of understanding of dietary principles but states that she is following her diet appropriately.  Sensory loss is noted.  Self foot exams are not being performed.  She is checking home blood sugars.  She says that she is exercising.  Duration of exercise is estimated to be 30-60  She is doing this 5 times per week.        Hypoglycemic symptoms are not occurring.  No hyperglycemic symptoms are reported.        There are no symptoms to suggest diabetic  complications.  No changes have been made to her treatment plan since last visit.    Hypertension History:      She denies headache, chest pain, and palpitations.  She notes no problems with any antihypertensive medication side effects.        Positive major cardiovascular risk factors include diabetes, hypertension, and current tobacco user.  Negative major cardiovascular risk factors include female age less than 66 years old and negative family history for ischemic heart disease.        Positive history for target organ damage include renal insufficiency.  Further assessment for target organ damage reveals no history of ASHD, cardiac end-organ damage (CHF/LVH), stroke/TIA, peripheral vascular disease, or hypertensive retinopathy.     Habits & Providers  Alcohol-Tobacco-Diet     Alcohol drinks/day: 0     Tobacco Status: current     Tobacco Counseling: to quit use of tobacco products     Cigarette Packs/Day: 11/2  Exercise-Depression-Behavior     Does Patient Exercise: yes     Exercise Counseling: not indicated; exercise is adequate     Type of exercise: N/A     Exercise (avg: min/session): 30-60  Times/week: 5     Have you felt down or hopeless? yes     Have you felt little pleasure in things? yes     Depression Counseling: further diagnostic testing and/or other treatment is indicated     Drug Use: past     Seat Belt Use: 100     Sun Exposure: occasionally  Comments: PHQ-9 score =18  Allergies (verified): No Known Drug Allergies  Review of Systems General:  Denies fever. Eyes:  Denies blurring. ENT:  Denies earache. CV:  Complains of swelling of feet; denies chest pain or discomfort. Resp:  Denies cough. GI:  Denies abdominal pain, nausea, and vomiting. GU:  Denies discharge. MS:  Denies joint pain. Derm:  Denies rash. Neuro:  Denies headaches. Psych:  Denies depression.  Physical Exam  General:  alert.   Head:  normocephalic.   Eyes:  vision grossly intact,  pupils equal, and pupils round.   Ears:  bil TM with clear fluid behind the TM Nose:  no nasal discharge.   Mouth:  pharynx pink and moist.   Neck:  supple.   Chest Wall:  no mass.   Breasts:  no abnormal thickening.   Lungs:  normal breath sounds.   Heart:  normal rate and regular rhythm.   Abdomen:  soft, non-tender, and normal bowel sounds.   Msk:  up to the exam table Extremities:  no edema Neurologic:  alert & oriented X3.    Diabetes Management Exam:    Foot Exam (with socks and/or shoes not present):       Sensory-Monofilament:          Left foot: normal          Right foot: normal   Impression & Recommendations:  Problem # 1:  ROUTINE GYNECOLOGICAL EXAMINATION (ICD-V72.31) labs reviewed from previous visit guaiac negative rec optho and dental exam mammogram scheduled.  self breast exams done will need to get tetanus on next visit EKG done PHQ-9 score = 18 Orders: Hemoccult Guaiac-1 spec.(in office) (82270) KOH/ WET Mount [phone]) Pap Smear, Thin Prep ( Collection of) (H4742) T- GC Chlamydia (59563)  Problem # 2:  DIABETES MELLITUS, TYPE I, ADULT ONSET (ICD-250.01) Assessment: Unchanged  HGBa1c send out done Her updated medication list for this problem includes:    Lantus 100 Unit/ml Soln (Insulin glargine) .Marland Kitchen... 23 units subcutaneously every evening    Humalog 100 Unit/ml Soln (Insulin lispro (human)) .Marland Kitchen... Take as directed accordking to sliding scale  Orders: T-Urine Microalbumin w/creat. ratio [phone])  Problem # 3:  EDEMA (ICD-782.3) advised pt to taper off her neurontin as possible cause for edema as she seems convinced that her psych meds are not the cause Her updated medication list for this problem includes:    Lasix 40 Mg Tabs (Furosemide) ..... One and 1/2 tablet by mouth three times a day for fluid  Problem # 4:  SKIN ULCER, CHRONIC (ICD-707.9) non-healing ulcer to right upper arm will refer to dermatology  Complete Medication  List: 1)  Lasix 40 Mg Tabs (Furosemide) .... One and 1/2 tablet by mouth three times a day for fluid 2)  Lantus 100 Unit/ml Soln (Insulin glargine) .... 23 units subcutaneously every evening 3)  Humalog 100 Unit/ml Soln (Insulin lispro (human)) .... Take as directed accordking to sliding scale 4)  Valtrex 500 Mg Tabs (Valacyclovir hcl) .Marland Kitchen.. 1 tablet by mouth daily 5)  Gabapentin 600 Mg Tabs (Gabapentin) .Marland Kitchen.. 1 tablet by mouth three times a day 6)  Ultram  50 Mg Tabs (Tramadol hcl) .... Take 1-2 tablets by mouth every 8 hours as needed for back pain 7)  Vistaril 50 Mg Caps (Hydroxyzine pamoate) .Marland Kitchen.. 1 capsule by mouth three times a day as needed 8)  Seroquel Xr 300 Mg Xr24h-tab (Quetiapine fumarate) .Marland Kitchen.. 1 tablet by mouth daily 9)  Omeprazole 20 Mg Tbec (Omeprazole) .Marland Kitchen.. 1 capsule by mouth daily for stomach 10)  Lancets Misc (Lancets) .... Dx 250.00 check blood sugar three times daily 11)  Prodigy Blood Glucose Test Strp (Glucose blood) .... Check blood sugar four times daily 12)  Klonopin 1 Mg Tabs (Clonazepam) .... Take one tablet 3 times a day *dr reddy 13)  Advair Diskus 100-50 Mcg/dose Misc (Fluticasone-salmeterol) .Marland Kitchen.. 1 inhalation two times a day  **rinse mouth after use** 14)  Prodigy Lancing Device Misc (Lancet devices) .... Dispense to use with lancets 15)  Prodigy Twist Top Lancets 28g Misc (Lancets) .... Check blood sugar four times daily 16)  Prodigy Blood Glucose Monitor Devi (Blood glucose monitoring suppl) .... Dispense glucometer 17)  Suboxone 8-2 Mg Subl (Buprenorphine hcl-naloxone hcl) .... One tablet by mouth three times a day  **dx by dr. Manson Passey** 18)  Polyethylene Glycol Powd (Polyethylene glycol 1450) .Marland Kitchen.. 17gm with a  full glass of water daily 19)  Wrist Splint Misc (Elastic bandages & supports) .... Get right wrist splint dx - peripheral neuropathy 20)  Knee Support Misc (Elastic bandages & supports) .... Right knee support size to fit dx: 719.46 21)  Pristiq 50 Mg  Xr24h-tab (Desvenlafaxine succinate) .Marland Kitchen.. 1 by mouth qam 22)  Cortisporin 1 % Oint (Bacit-poly-neo hc) .... One application topically two times a day to affected area 23)  Loratadine 10 Mg Tabs (Loratadine) .... One tablet by mouth daily as needed for allergies 24)  Zetia 10 Mg Tabs (Ezetimibe) .... One tablet by mouth daily for cholesterol **prior approval granted**  Other Orders: Capillary Blood Glucose/CBG (16109) Dermatology Referral (Derma)  Diabetes Management Assessment/Plan:      Her blood pressure goal is < 130/80.    Hypertension Assessment/Plan:      The patient's hypertensive risk group is category C: Target organ damage and/or diabetes.  Her calculated 10 year risk of coronary heart disease is 9 %.  Today's blood pressure is 107/74.  Her blood pressure goal is < 130/80.  Patient Instructions: 1)  Taper down off the gabapentin and see if the swelling gets better. 2)  You will be referred to dermatology. 3)  Follow up here in 3 months for diabetes.   Last LDL:                                                 192 (09/18/2009 9:25:00 PM)        Diabetic Foot Exam    10-g (5.07) Semmes-Weinstein Monofilament Test Performed by: Levon Hedger          Right Foot          Left Foot Visual Inspection               Test Control      normal         normal Site 1         normal         normal Site 2         normal  normal Site 3         normal         normal Site 4         normal         normal Site 5         normal         normal Site 6         normal         normal Site 7         normal         normal Site 8         normal         normal Site 9         normal         normal Site 10         normal         normal  Impression      normal         normal   Prevention & Chronic Care Immunizations   Influenza vaccine: Fluvax 3+  (05/14/2009)    Tetanus booster: Not documented   Td booster deferral: Not available  (10/11/2009)    Pneumococcal vaccine:  Pneumovax  (03/30/2009)  Other Screening   Pap smear: Not documented   Pap smear action/deferral: Ordered  (10/11/2009)   Pap smear due: 10/12/2010    Mammogram: Not documented   Mammogram action/deferral: Ordered  (10/11/2009)   Smoking status: current  (10/11/2009)   Smoking cessation counseling: yes  (08/15/2008)  Diabetes Mellitus   HgbA1C: 6.8  (09/18/2009)   HgbA1C action/deferral: Ordered  (10/11/2009)   Hemoglobin A1C due: 12/16/2009    Eye exam: Not documented   Diabetic eye exam action/deferral: Ophthalmology referral  (10/11/2009)    Foot exam: yes  (10/11/2009)   Foot exam action/deferral: Do today   High risk foot: Not documented   Foot care education: Not documented    Urine microalbumin/creatinine ratio: Not documented   Urine microalbumin action/deferral: Ordered  Lipids   Total Cholesterol: 271  (09/18/2009)   LDL: 192  (09/18/2009)   LDL Direct: Not documented   HDL: 51  (09/18/2009)   Triglycerides: 142  (09/18/2009)  Hypertension   Last Blood Pressure: 107 / 74  (10/11/2009)   Serum creatinine: 1.14  (09/18/2009)   Serum potassium 3.8  (09/18/2009)  Self-Management Support :    Diabetes self-management support: Not documented    Hypertension self-management support: Not documented   Nursing Instructions: Refer for screening diabetic eye exam (see order)   Laboratory Results   Urine Tests  Date/Time Received: October 11, 2009 1:37 PM  Date/Time Reported: October 11, 2009 1:37 PM   Routine Urinalysis   Color: lt. yellow Appearance: Clear Glucose: >=1000   (Normal Range: Negative) Bilirubin: negative   (Normal Range: Negative) Ketone: negative   (Normal Range: Negative) Spec. Gravity: <1.005   (Normal Range: 1.003-1.035) Blood: negative   (Normal Range: Negative) pH: 7.5   (Normal Range: 5.0-8.0) Protein: negative   (Normal Range: Negative) Urobilinogen: 0.2   (Normal Range: 0-1) Nitrite: negative   (Normal Range:  Negative) Leukocyte Esterace: negative   (Normal Range: Negative)     Blood Tests   Date/Time Received: October 11, 2009 3:15 PM   CBG Random:: 331  Date/Time Received: October 11, 2009 3:15 PM   Wet Mount/KOH Source: vaginal WBC/hpf: 1-5 Bacteria/hpf: rare Clue cells/hpf: none Yeast/hpf: none Trichomonas/hpf: none  Stool - Occult Blood  Hemmoccult #1: negative Date: 10/11/2009    Laboratory Results   Urine Tests    Routine Urinalysis   Color: lt. yellow Appearance: Clear Glucose: >=1000   (Normal Range: Negative) Bilirubin: negative   (Normal Range: Negative) Ketone: negative   (Normal Range: Negative) Spec. Gravity: <1.005   (Normal Range: 1.003-1.035) Blood: negative   (Normal Range: Negative) pH: 7.5   (Normal Range: 5.0-8.0) Protein: negative   (Normal Range: Negative) Urobilinogen: 0.2   (Normal Range: 0-1) Nitrite: negative   (Normal Range: Negative) Leukocyte Esterace: negative   (Normal Range: Negative)     Blood Tests     CBG Random:: 331mg /dL    DIRECTV KOH: Negative  Stool - Occult Blood Hemmoccult #1: negative      EKG  Procedure date:  10/11/2009  Findings:      NSR  Appended Document: Complete Physical Exam right upper extremity - covered with a bandage. irregular shaped skin ulcer.  pink, fleshy tissue - no evidence of infection

## 2010-09-05 NOTE — Letter (Signed)
Summary: [facility]   Imported By: Arta Bruce 01/31/2010 12:29:47  _____________________________________________________________________  External Attachment:    Type:   Image     Comment:   External Document

## 2010-09-05 NOTE — Progress Notes (Signed)
Summary: FELL,PUT 3 INCH GASH IN HER HEAD  Phone Note Call from Patient Call back at Home Phone [phone]   Summary of Call: MARTIN PT. MS RCHARDSON SAYS THAT SHE WENT TO [place] THIS WEEKEND TO SEE HER KIDS FATHER, AND HER SUGAR DROPPED TO 30 AND SHE FEEL AND HIT HER HEAD AND WAS TAKEN TO [facility].WITH A 3 INCH CUT ON HER FOREHEAD,  AND THEY GAVE HER ZEBUTAL AND SHE SAYS ITS NOT WORKING, AND SHE IS ALREADY TAKING DIFLONAC AND SHE SAYS IT HAS IBUPORFEN IN IT AND SHE CANT TAKE IBUPROFEN, BECAUSE IT CAUSES HER TO SWELL AND SHE ALREADY HAS SWELLING IN HER HANDS,FEET AND ANKLES. MS Berendt WANTS TO KNOW IF YOU CAN GIVE HER ANYTHING THAT WILL TAKE THE PAIN AWAY FOR MAYBE FOR ABOUT 4 DAYS OR SO. Initial call taken by: Leodis Rains,  June 24, 2010 2:15 PM  Follow-up for Phone Call        will refill her tramadol (rx in basket) she should continue to protocal as per ER recs Follow-up by: Lehman Prom FNP,  June 24, 2010 5:53 PM  Additional Follow-up for Phone Call Additional follow up Details #1::        Pt. notified, order faxed to [facility] Additional Follow-up by: Gaylyn Cheers RN,  June 25, 2010 9:46 AM    Prescriptions: Janean Sark 50 MG TABS (TRAMADOL HCL) One tablet by mouth two times a day as needed for pain  #60 x 0   Entered and Authorized by:   Lehman Prom FNP   Signed by:   Lehman Prom FNP on 06/24/2010   Method used:   Print then Give to Patient   RxID:   [id number]

## 2010-09-05 NOTE — Progress Notes (Signed)
Summary: need to speak with Zena Amos about dosis  Phone Note Call from Patient   Summary of Call: Patient need to know the reason for dosis of the medicine 2 pills a day please call her at [phone] Initial call taken by: Domenic Polite,  July 19, 2010 12:32 PM  Follow-up for Phone Call        Levon Hedger  July 24, 2010 12:52 PM Left message on machine for pt to return call to the office.   Left message on answering machine for pt to call back.Marland KitchenMarland KitchenMarland KitchenArmenia Shannon  July 26, 2010 10:06 AM  Levon Hedger  July 30, 2010 3:01 PM left message on machine for pt to return call to the office.  Will mail letter.

## 2010-09-05 NOTE — Progress Notes (Signed)
Summary: Pain med faxed  Phone Note Call from Patient Call back at [phone]   Summary of Call: PLEASE CALL PATIENT SHE NEEDS TO TALK THE NURSE, SHE IS HAVING PROBLEMS SHE SAYS MEDICINE  ITS NOT WORKING, AND SHE IS    SWELLING IN HER HANDS,FEET AND ANKLES. Monica [name] WANTS TO KNOW IF YOU CAN GIVE HER ANYTHING THAT WILL TAKE THE PAIN AWAY. P[facility]  Initial call taken by: Domenic Polite,  June 25, 2010 9:25 AM  Follow-up for Phone Call        Discussed with pt. importance of low sodium diet and things to eat and suggestions on how to decrease sodium intake. Pain med faxed to [facility]. Requesting to have more than 2/ day. May apply ice to head, relaxation tech. advised pt the goal is not total pain relief but she should be able to tolerate and be able to perform activities of daily living. Follow-up by: Gaylyn Cheers RN,  June 25, 2010 9:50 AM

## 2010-09-05 NOTE — Miscellaneous (Signed)
Summary: Med update  Clinical Lists Changes  Medications: Changed medication from LASIX 40 MG TABS (FUROSEMIDE) One and 1/2 tablet by mouth three times a day for fluid to LASIX 40 MG TABS (FUROSEMIDE) One and 1/2 tablet by mouth two times a day Observations: Added new observation of FLU VAX: given at [facility] (05/28/2010 17:36)

## 2010-09-05 NOTE — Progress Notes (Signed)
Summary: SEVERE COUGH (FNP MARTIN PT)  Phone Note Call from Patient   Complaint: Cough/Sore throat Summary of Call: Pt is been having a cough since 1 week ago and she is coughing yellow and pt wants fnp martin to prescribe somenthing . [facility] .Please, call her @ [phone]. Initial call taken by: Cheryll Dessert,  July 02, 2010 3:14 PM  Follow-up for Phone Call        Have had cough about one week, no better.  Chest is hurting from coughing, productive of yellowish.  Has slight nasal clear discharge.  Denies fever, vomiting.  Nausea from mucus draining.  Has tried Mucinex and claritin, stopped taking claritin because she was getting headache, too drying.  Denies headache or ear pain, states only in her chest.  Drinking fluids without difficulty.  States anorexia due to coughing, still smoking.  Advised re home management remedies for cold per protocol-.  Discussed not smoking as it irritates respiratory passages -- says she'll try to cut back.  To call back if symptoms persist or worsen over the next few days. Follow-up by: Dutch Quint RN,  July 02, 2010 5:02 PM

## 2010-09-05 NOTE — Letter (Signed)
Summary: MA[facility]ED REQUESTED [facility]ORDS [facility]  MAILED REQUESTED RECORDS [facility]   Imported By: Arta Bruce 11/30/2009 11:54:36  _____________________________________________________________________  External Attachment:    Type:   Image     Comment:   External Document

## 2010-09-05 NOTE — Progress Notes (Signed)
Summary: Nutrition referral  Phone Note Call from Patient Call back at Home Phone [phone]   Summary of Call: the pt missed the appointment with the nutrition management [phone]) and she needs a new referral.  Please call her back whe is referral.   Daphine Deutscher FNP Initial call taken by: Manon Hilding,  October 22, 2009 4:49 PM  Follow-up for Phone Call        forward to N. Daphine Deutscher, FNP Follow-up by: Levon Hedger,  October 23, 2009 9:42 AM  Additional Follow-up for Phone Call Additional follow up Details #1::        will forward to debra finley to see if she can reschedule the pt for a nutrition consult at [facility] Additional Follow-up by: Lehman Prom FNP,  October 23, 2009 2:09 PM

## 2010-09-05 NOTE — Letter (Signed)
Summary: NUTRITION  DIABETES MANAGEMENT  NUTRITION  DIABETES MANAGEMENT   Imported By: Arta Bruce 12/27/2009 14:40:17  _____________________________________________________________________  External Attachment:    Type:   Image     Comment:   External Document

## 2010-09-05 NOTE — Letter (Signed)
Summary: *HSN Results Follow up  [facility]  [address] [place], [place] [address]   Phone: [phone]  Fax: [phone]      10/16/2009   [facility] [address] [place], [place]  [address]   Dear  Ms. Breanah Kopka,                            ____S.Drinkard,FNP   ____D. Gore,FNP       ____B. McPherson,MD   ____V. Rankins,MD    ____E. Mulberry,MD    _X___N. Daphine Deutscher, FNP  ____D. Reche Dixon, MD    ____K. Philipp Deputy, MD    ____Other     This letter is to inform you that your recent test(s):  __X_____Pap Smear    _______Lab Test     _______X-ray    ___X____ is within acceptable limits  _______ requires a medication change  _______ requires a follow-up lab visit  _______ requires a follow-up visit with your provider   Comments: Pap Smear results are normal.       _________________________________________________________ If you have any questions, please contact our office [phone].                    Sincerely,    Lehman Prom FNP [facility]

## 2010-09-09 ENCOUNTER — Emergency Department (HOSPITAL_COMMUNITY)
Admission: EM | Admit: 2010-09-09 | Discharge: 2010-09-09 | Disposition: A | Discharge status: Home / Self Care | Payer: Medicaid Other | Attending: Emergency Medicine | Admitting: Emergency Medicine

## 2010-09-09 ENCOUNTER — Telehealth (INDEPENDENT_AMBULATORY_CARE_PROVIDER_SITE_OTHER): Payer: Self-pay | Admitting: Nurse Practitioner

## 2010-09-09 DIAGNOSIS — E109 Type 1 diabetes mellitus without complications: Secondary | ICD-10-CM | POA: Insufficient documentation

## 2010-09-09 DIAGNOSIS — H5710 Ocular pain, unspecified eye: Secondary | ICD-10-CM | POA: Insufficient documentation

## 2010-09-09 DIAGNOSIS — F17200 Nicotine dependence, unspecified, uncomplicated: Secondary | ICD-10-CM | POA: Insufficient documentation

## 2010-09-09 DIAGNOSIS — F319 Bipolar disorder, unspecified: Secondary | ICD-10-CM | POA: Insufficient documentation

## 2010-09-09 DIAGNOSIS — B1920 Unspecified viral hepatitis C without hepatic coma: Secondary | ICD-10-CM | POA: Insufficient documentation

## 2010-09-11 NOTE — Progress Notes (Signed)
Summary: referral  Phone Note Call from Patient   Summary of Call: PT SAYS SHE MADE AN APPT WITH ENDOCRINOLOGIST AND SHE NEEDS A REFERRAL.... DR. KUMAR [address] [phone] Initial call taken by: [place] Shannon,  September 02, 2010 2:42 PM  Follow-up for Phone Call        forward to N. Daphine Deutscher, fnp Follow-up by: Levon Hedger,  September 02, 2010 3:59 PM  Additional Follow-up for Phone Call Additional follow up Details #1::        will not give referral advised pt that she did not need appt with endocrinologist  (see previous phone note) Additional Follow-up by: Lehman Prom FNP,  September 02, 2010 4:10 PM    Additional Follow-up for Phone Call Additional follow up Details #2::    info given Gaylyn Cheers RN  September 03, 2010 9:42 AM

## 2010-09-11 NOTE — Progress Notes (Signed)
Summary: Multiple issues  Phone Note Outgoing Call Call back at  [phone]   Summary of Call: Check with pt on Wednesday afternoon - ensure that she got the antibiotics from [facility].  She should take by mouth two times a day with food for 10 days.  still continue with the warm salt gargles  also advise pt that I got the message from her mom about her kidney function, who stated that Dr. Briant Cedar indicates that labs are ok.  I am still NOT going to increase lasix.  Pt needs to improve her eating habits and maintain her lifestyle better. increasing lasix is not the answer.  and i know she does not want to hear it but while her kidney function has improved she should want it to stay that way Initial call taken by: Lehman Prom FNP,  August 27, 2010 6:21 PM  Follow-up for Phone Call        Left message on answering machine for pt. to return call.  Dutch Quint RN  August 28, 2010 3:54 PM  Left message on answering machine for pt. to return call.  Dutch Quint RN  August 29, 2010 10:41 AM  Pt. called, stating lymph nodes are still swollen and would like a referral to an ENT.  Advised to complete antibiotic course before considering ENT, but would pass her request to you.  Given your response re lasix, diet and lifestyle changes - states she is blowing up like a balloon, can't understand what she has to do to prove she needs Lasix.   She is gaining more weight, even with mild exercise -- has gained 5 lbs. in less than two weeks, states it's all fluid.  I advised her that you will not increase lasix, but that I would inform you anyway.  Is extremely insistent that since her K+ is good, can't understand why she can't get lasix.  Stated that maybe another provider would give it back to her, but we'd probably tell them not to.  Advised that we have no control over another provider's treatment.  Would like a referral to an endocrinologist if possible.  Back and knees are painful, nonnarcotic  medications not adequate.  Ultram not effective.  Would like something to help with pain.  Dutch Quint RN  August 29, 2010 3:47 PM   Additional Follow-up for Phone Call Additional follow up Details #1::        No endocrinology referral - no reason.  last hgba1c = 6.5 will not prescribe anything stronger for pain and she knows that.  I'm trying to save her from herself with my stand on the lasix and the pain meds.  pt needs boundaries.  I'm sorry that things are not going as well as they once were in her life and that is likely why her situation has been more than desirable lately Additional Follow-up by: Lehman Prom FNP,  August 29, 2010 5:26 PM    Additional Follow-up for Phone Call Additional follow up Details #2::    Left message on voicemail for pt. to return call.  Dutch Quint RN  August 30, 2010 4:32 PM Pt. notified of above, requesting to change providers. Informed her that @ this time we are not able to change providers and we will be moving to [facility].  Follow-up by: Gaylyn Cheers RN,  September 02, 2010 2:22 PM

## 2010-09-11 NOTE — Progress Notes (Signed)
Summary: Swollen lymph nodes  Phone Note Call from Patient   Summary of Call: PT CALLED STATING THAT HER LEFT LYMPH NODE IS SWELLING UP AGAIN..... PT SAYS SHE RECIEVED THE MED AND SHE IS STILL TAKING IT Initial call taken by: Armenia Shannon,  September 02, 2010 11:57 AM  Follow-up for Phone Call        Advised to continue all meds as prescribed. May gargle with warm salt water if throat becomes sore. Contact us if develops fever.  Follow-up by: Gaylyn Cheers RN,  September 02, 2010 2:24 PM

## 2010-09-13 ENCOUNTER — Encounter (INDEPENDENT_AMBULATORY_CARE_PROVIDER_SITE_OTHER): Payer: Self-pay | Admitting: Nurse Practitioner

## 2010-09-16 ENCOUNTER — Telehealth (INDEPENDENT_AMBULATORY_CARE_PROVIDER_SITE_OTHER): Payer: Self-pay | Admitting: Nurse Practitioner

## 2010-09-19 ENCOUNTER — Telehealth (INDEPENDENT_AMBULATORY_CARE_PROVIDER_SITE_OTHER): Payer: Self-pay | Admitting: Nurse Practitioner

## 2010-09-19 ENCOUNTER — Encounter (INDEPENDENT_AMBULATORY_CARE_PROVIDER_SITE_OTHER): Payer: Self-pay | Admitting: Nurse Practitioner

## 2010-09-19 NOTE — Progress Notes (Signed)
Summary: WANTS REF AGAIN  Phone Note Call from Patient Call back at [phone]   Reason for Call: Talk to Nurse Summary of Call: Monica PT. MS Marchio SAYS THAT SHE WANTS THE REF TO SEE A ENDOCRONOLOGIST, SHE ALREADY MADE THE APPT. HER SUGAR IS NOT STABLE AND NOW HER RIGHT EYE IS GIVING HER TROUBLE AND SHE HAS BEEN ADMITTED TO THE HOSP CONCERNING HER SUGAR Initial call taken by: Leodis Rains,  September 09, 2010 12:43 PM  Follow-up for Phone Call        Not currently in the hospital -- states sugars were running low, currently normal, wants to go to endocrinologist.  Wants to know if you won't refer her, what would happen if she went anyway?  Advised that she would probably be referred back to her PCP.  Is considering going to another provider that takes Medicaid, advised that she would have to call and locate one.  ED records re her eye issues in your refill cubby. Follow-up by: Dutch Quint RN,  September 10, 2010 2:56 PM  Additional Follow-up for Phone Call Additional follow up Details #1::        Again, I do not feel she needs endocrinology.  Her BS are fluctuating because she is not eating well.  It is up to her to make the decision to find another provider that takes Medicaid.  I am giving her advice based on my clinical judgement.  If she disagrees with that, it is unfortunate. Additional Follow-up by: Lehman Prom FNP,  September 11, 2010 8:36 AM    Additional Follow-up for Phone Call Additional follow up Details #2::    Pt. advised of provider's response -- verbalized understanding, disagrees with her decision.  No further comments made re changing providers.  Dutch Quint RN  September 11, 2010 11:37 AM

## 2010-09-25 NOTE — Progress Notes (Signed)
Summary: HEADACH/ WANTS MEDS  Phone Note Call from Patient Call back at Home Phone [phone]   Reason for Call: Talk to Nurse Summary of Call: MARTIN PT. MS Hartsough IS CALLING BECAUSE SHE HAS A TERRIBLE HEADACHE AND SHE WANTS TO KNOW IF YOU CAN CALL SOMETHING IN UNTIL SHE COMES TO SEE Monica [name] NEXT THURSDAY. [facility] [address] Initial call taken by: Leodis Rains,  September 19, 2010 4:30 PM  Follow-up for Phone Call        Sent to N. Daphine Deutscher.  I don't think she believes me -- see phone note dated 09/16/10.  Dutch Quint RN  September 19, 2010 4:36 PM   Additional Follow-up for Phone Call Additional follow up Details #1::        pt is quite persistant. [facility] Additional Follow-up by: Lehman Prom FNP,  September 19, 2010 5:25 PM    Additional Follow-up for Phone Call Additional follow up Details #2::    Pt. notified.  Dutch Quint RN  September 19, 2010 5:53 PM   New/Updated Medications: SEDAPAP 50-650 MG TABS (BUTALBITAL-ACETAMINOPHEN) One tablet by mouth daily as needed for headaches Prescriptions: SEDAPAP 50-650 MG TABS (BUTALBITAL-ACETAMINOPHEN) One tablet by mouth daily as needed for headaches  #10 x 0   Entered and Authorized by:   Lehman Prom FNP   Signed by:   Lehman Prom FNP on 09/19/2010   Method used:   [facility] to        [facility]  [phone]* (retail)       [address] [place], [place]  [address]       Ph: [phone] or [phone]       Fax: [phone]   RxID:   [id number]

## 2010-09-25 NOTE — Letter (Signed)
Summary: [facility]   Imported By: Arta Bruce 09/20/2010 09:36:18  _____________________________________________________________________  External Attachment:    Type:   Image     Comment:   External Document

## 2010-09-25 NOTE — Miscellaneous (Signed)
Summary: Eye Exam   Diabetes Management History:      The patient is a 50 years old female who comes in for evaluation of Type 1 Diabetes Mellitus.  She has not been enrolled in the "Diabetic Education Program".  She is checking home blood sugars.  She says that she is exercising.  Duration of exercise is estimated to be 30-60  She is doing this 5 times per week.    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  Her blood pressure goal is < 130/80.   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

## 2010-09-25 NOTE — Progress Notes (Signed)
Summary: Flu like symptoms  Phone Note Call from Patient Call back at Home Phone [phone]   Summary of Call: pt calling stating that she is having flu like symptoms over the weekend(saturday). states fever 102 on saturday and now its 99. she complains of body aches, fever, chills, headaches(very bad) she states that she is has to be in the dark due to the headaches. and c/o being dizzy.  Initial call taken by: Hassell Halim CMA,  September 16, 2010 9:37 AM  Follow-up for Phone Call        Having really bad headaches lately, worse with being ill.  Headaches have been going on for about six months -- states mother told her that she has hx of premenopausal headaches and that pt. could be having same.  Would like something for headache other than Tylenol or ibuprofen -- headaches are making her nauseated and dizzy.  Advised per flu protocol -- verbalized understanding and agreement.   Follow-up by: Dutch Quint RN,  September 16, 2010 5:56 PM  Additional Follow-up for Phone Call Additional follow up Details #1::        advise pt to continue on flu protocal - stay hydrated also she has tramadol - she gets 90 per month. she can use this for her headache.  if necessary she can alternate between Extra Strength tylenol which can be purchased OTC Additional Follow-up by: Lehman Prom FNP,  September 17, 2010 8:20 AM    Additional Follow-up for Phone Call Additional follow up Details #2::    Headache only went away with Tylenol and cold medicine, which she doesn't want to take long-term.  Only cold medicine seems to help.  Advised as per provider's recommendation -- states tramadol doesn't work.  Thinks they are migraine headaches and what she's been taking isn't taking the pain away.  Dutch Quint RN  September 17, 2010 3:17 PM   Additional Follow-up for Phone Call Additional follow up Details #3:: Details for Additional Follow-up Action Taken: will not prescribe anything else for pain at this  time Additional Follow-up by: Lehman Prom FNP,  September 18, 2010 8:12 AM

## 2010-09-25 NOTE — Progress Notes (Signed)
Summary: Wants referral to [facility]  Phone Note Call from Patient Call back at Home Phone [phone]   Summary of Call: pt c/o headaches and states that she thinks she is having premenopausal headaches. she would like a referral to see a local office. [facility].Marland KitchenMarland [phone]. she states that you know about her headaches. Initial call taken by: Hassell Halim CMA,  September 19, 2010 10:29 AM  Follow-up for Phone Call        As stated in previous notes - pt will not be referred to any other office. If she thinks headaches are perimenopausal then that is not something that neurology with address anyway.  she should come discuss her symptoms with the PCP to decide on most appropriate treatment of care Follow-up by: Lehman Prom FNP,  September 19, 2010 11:12 AM  Additional Follow-up for Phone Call Additional follow up Details #1::        Pt. querying other referrals -- advised that all referrals must start in this office after OV with PCP.  States she has had severe headaches for a long time, also several episodes of syncope.  Appt. scheduled for 09/26/10 with provider.  Dutch Quint RN  September 19, 2010 12:17 PM

## 2010-09-26 ENCOUNTER — Telehealth (INDEPENDENT_AMBULATORY_CARE_PROVIDER_SITE_OTHER): Payer: Self-pay | Admitting: Nurse Practitioner

## 2010-10-01 ENCOUNTER — Telehealth (INDEPENDENT_AMBULATORY_CARE_PROVIDER_SITE_OTHER): Payer: Self-pay | Admitting: Nurse Practitioner

## 2010-10-01 NOTE — Progress Notes (Signed)
Summary: Needs refills until OV- bad headaches  Phone Note Refill Request Call back at Home Phone [phone]   Refills Requested: Medication #1:  SEDAPAP 50-650 MG TABS One tablet by mouth daily as needed for headaches.  Medication #2:  GABAPENTIN 800 MG TABS One tablet by mouth three times a day  **note change in dose** pt would liike to know can she get a refill on med til March 9 appt  Initial call taken by: Armenia Shannon,  September 26, 2010 4:39 PM  Follow-up for Phone Call        pt called again....   pt says she went to eye doctor and headaches are not from eye straining... pt says she got a ct-scan [facility] in [place] .... Armenia Shannon  September 27, 2010 10:19 AM  Cab did not show -- had to reschedule appt.  for 10/11/10.  Got new glasses, HA are not due to eye problem.  Went to hospital in [place] over the weekend, got CT scan, was told scan was normal.  Needs refills on gabapentin and sedapap -- was taking 2 of sedapap when HAs were really bad, is out of meds.  DIdn't wake up this morning with HA, but has one now.  HAs continue to be bad.  Follow-up by: Dutch Quint RN,  September 27, 2010 11:13 AM  Additional Follow-up for Phone Call Additional follow up Details #1::        ok to refill gabapentin as it has not been filled in several months and she has likely been out of it for sometime.  I think pt was taking sporatically becuase of thoughts of it causing weight gain Thanks for the reports on ER visits in [place]. Again pt is not taking taking meds as ordered. Intentionally have her 10 to see if she was going to take DAILY as ordered.  She is on a multitude of medications and taking as per her own will or what she thinks she should take instead of as ordered could be part of her problem.  Will not refill sedapap at this time. Additional Follow-up by: Lehman Prom FNP,  September 27, 2010 11:26 AM    Additional Follow-up for Phone Call Additional follow up Details #2::  Advised of provider's response and refill of Gabapentin only completed.  Stated she will be going to the hospital again for her headaches over the weekend to get what she needs.  Reiterated that if one pill is good, does not make taking 2 work better- the risks very often can outweigh the benefits, including inflicting harm on her body.  Advised that just because she gets a Rx from the hospital does not guarantee refills from her provider here.  Verbalized understanding -- states she is doing what she has to do because of the pain.  Dutch Quint RN  September 27, 2010 1:04 PM   Additional Follow-up for Phone Call Additional follow up Details #3:: Details for Additional Follow-up Action Taken: noted Additional Follow-up by: Lehman Prom FNP,  September 27, 2010 1:28 PM  Prescriptions: GABAPENTIN 800 MG TABS (GABAPENTIN) One tablet by mouth three times a day  **note change in dose**  #90 x 3   Entered by:   Dutch Quint RN   Authorized by:   Lehman Prom FNP   Signed by:   Dutch Quint RN on 09/27/2010   Method used:   [facility]ron[facility] to        [facility]  [phone]* (retail)  [address] [place], [place]  [address]       Ph: [phone] or [phone]       Fax: [phone]   RxID:   [id number]

## 2010-10-10 NOTE — Progress Notes (Signed)
Summary: Sedapap Refill  Phone Note Call from Patient   Summary of Call: Can you call in enough meds for headache to last until her appt 3/9([facility]) Initial call taken by: Nicholaus Bloom,  October 01, 2010 3:11 PM  Follow-up for Phone Call        [facility] sent [facility] to [facility]. Advise pt to take as DIRECTED see other phone note open as well - no need for z-pak if pt does not have infection.  will assess during her visit Follow-up by: Lehman Prom FNP,  October 01, 2010 6:32 PM  Additional Follow-up for Phone Call Additional follow up Details #1::        Z-pak was not needed -- it was sedapap.  Pt.notified of provider's instructions and refill.  Verbalized understanding and agreement.  Dutch Quint RN  October 02, 2010 10:09 AM     Prescriptions: SEDAPAP 50-650 MG TABS (BUTALBITAL-ACETAMINOPHEN) One tablet by mouth daily as needed for headaches  #20 x 0   Entered and Authorized by:   Lehman Prom FNP   Signed by:   Lehman Prom FNP on 10/01/2010   Method used:   [facility]ronically to        [facility]  [address] [id number] (retail)       [address]       [place], [place]  [address]       Ph: [phone] or [phone]       Fax: [phone]   RxID:   [id number]

## 2010-10-10 NOTE — Progress Notes (Signed)
Summary: z-pak, needed for HA  Phone Note Call from Patient Call back at [phone]   Caller: Patient Reason for Call: Refill Medication Summary of Call: z-pak HEADAches, need refill, call pat ASAP states//ds Initial call taken by: Ernestine Mcmurray,  October 01, 2010 9:29 AM  Follow-up for Phone Call        Left message on answer machine for pt. to return call. Gaylyn Cheers RN  October 01, 2010 12:40 PM   Not Z-pak, needs sedapap.  Other phone note has addressed this.  Dutch Quint RN  October 02, 2010 10:08 AM

## 2010-10-11 ENCOUNTER — Encounter (INDEPENDENT_AMBULATORY_CARE_PROVIDER_SITE_OTHER): Payer: Self-pay | Admitting: Nurse Practitioner

## 2010-10-11 ENCOUNTER — Encounter: Payer: Self-pay | Admitting: Nurse Practitioner

## 2010-10-11 DIAGNOSIS — Z780 Asymptomatic menopausal state: Secondary | ICD-10-CM | POA: Insufficient documentation

## 2010-10-11 DIAGNOSIS — R519 Headache, unspecified: Secondary | ICD-10-CM | POA: Insufficient documentation

## 2010-10-11 DIAGNOSIS — R51 Headache: Secondary | ICD-10-CM | POA: Insufficient documentation

## 2010-10-11 LAB — CONVERTED CEMR LAB: Blood Glucose, Fingerstick: 106

## 2010-10-15 NOTE — Assessment & Plan Note (Addendum)
Summary: Headache   Vital Signs:  Patient profile:   50 year old female Menstrual status:  irregular LMP:     09/2010 Weight:      201.3 pounds BMI:     33.62 Temp:     98.0 degrees F oral Pulse rate:   80 / minute Pulse rhythm:   regular Resp:     20 per minute BP sitting:   114 / 80  (left arm) Cuff size:   regular  Vitals Entered By: Levon Hedger (October 11, 2010 4:23 PM)  Nutrition Counseling: Patient's BMI is greater than 25 and therefore counseled on weight management options. CC: headaches, Headache Is Patient Diabetic? Yes Pain Assessment Patient in pain? yes     Location: head Intensity: 7 CBG Result 106  Does patient need assistance? Functional Status Self care Ambulation Normal LMP (date): 09/2010 LMP - Character: normal     Enter LMP: 09/2010 Last PAP Result  Specimen Adequacy: Satisfactory for evaluation.   Interpretation/Result:Negative for intraepithelial Lesion or Malignancy.      CC:  headaches and Headache.  History of Present Illness:  Pt into the office for f/u on headaches Headaches ongoing over the years but have recently increased in nature. Pt reports that her mother has some perimenopausal symptoms  Headache HPI:      The patient comes in for an acute, first time visit for headaches.  The headaches will last anywhere from 2 hours to 3 days at a time.        The headaches are not associated with an aura.  The location of the headaches are bilateral.  Headache quality is throbbing or pulsating.  Precipitating factors consist of perimenstrual.  The headaches are associated with photophobia.        Positive alarm features include new onset H/A's in middle-age or later.         Allergies (verified): No Known Drug Allergies  Review of Systems General:  Denies fever. CV:  Denies chest pain or discomfort. Resp:  Denies cough. GI:  Denies abdominal pain, nausea, and vomiting. Neuro:  Complains of headaches.  Physical Exam  General:   alert.   Head:  normocephalic.   Msk:  normal ROM.   Neurologic:  alert & oriented X3.   Skin:  color normal.   Psych:  Oriented X3.     Impression & Recommendations:  Problem # 1:  HEADACHE (ICD-784.0) headache Her updated medication list for this problem includes:    Ultram 50 Mg Tabs (Tramadol hcl) ..... One tablet by mouth three times a day as needed for pain    Diclofenac Sodium 75 Mg Tbec (Diclofenac sodium) ..... One tablet by mouth two times a day as needed for knee pain    Sedapap 50-650 Mg Tabs (Butalbital-acetaminophen) ..... One tablet by mouth two times a day as needed for headache  Problem # 2:  PERIMENOPAUSAL STATUS (ICD-V49.81)  advised pt of the symptoms  Complete Medication List: 1)  Lasix 40 Mg Tabs (Furosemide) .... One and 1/2 tablet by mouth two times a day 2)  Lantus 100 Unit/ml Soln (Insulin glargine) .... 23 units subcutaneously every evening 3)  Humalog 100 Unit/ml Soln (Insulin lispro (human)) .... Take as directed accordking to sliding scale 4)  Valtrex 500 Mg Tabs (Valacyclovir hcl) .Marland Kitchen.. 1 tablet by mouth daily 5)  Gabapentin 800 Mg Tabs (Gabapentin) .... One tablet by mouth three times a day  **note change in dose** 6)  Ultram 50 Mg Tabs (  Tramadol hcl) .... One tablet by mouth three times a day as needed for pain 7)  Vistaril 50 Mg Caps (Hydroxyzine pamoate) .Marland Kitchen.. 1 capsule by mouth three times a day as needed 8)  Seroquel Xr 300 Mg Xr24h-tab (Quetiapine fumarate) .Marland Kitchen.. 1 tablet by mouth daily 9)  Omeprazole 20 Mg Tbec (Omeprazole) .Marland Kitchen.. 1 capsule by mouth daily for stomach 10)  Lancets Misc (Lancets) .... Dx 250.00 check blood sugar three times daily 11)  Prodigy Blood Glucose Test Strp (Glucose blood) .... Check blood sugar four times daily 12)  Valium 10 Mg Tabs (Diazepam) .... One tablet by mouth two times a day **rx by pysch** 13)  Advair Diskus 100-50 Mcg/dose Misc (Fluticasone-salmeterol) .Marland Kitchen.. 1 inhalation two times a day  **rinse mouth after  use** 14)  Prodigy Lancing Device Misc (Lancet devices) .... Dispense to use with lancets 15)  Prodigy Twist Top Lancets 28g Misc (Lancets) .... Check blood sugar four times daily 16)  Prodigy Blood Glucose Monitor Devi (Blood glucose monitoring suppl) .... Dispense glucometer 17)  Polyethylene Glycol Powd (Polyethylene glycol 1450) .Marland Kitchen.. 17gm with a  full glass of water daily 18)  Wrist Splint Misc (Elastic bandages & supports) .... Get right wrist splint dx - peripheral neuropathy 19)  Knee Support Misc (Elastic bandages & supports) .... Right knee support size to fit dx: 719.46 20)  Pristiq 50 Mg Xr24h-tab (Desvenlafaxine succinate) .Marland Kitchen.. 1 by mouth qam 21)  Loratadine 10 Mg Tabs (Loratadine) .... One tablet by mouth daily as needed for allergies 22)  Zetia 10 Mg Tabs (Ezetimibe) .... One tablet by mouth daily for cholesterol **prior approval granted** 23)  Diclofenac Sodium 75 Mg Tbec (Diclofenac sodium) .... One tablet by mouth two times a day as needed for knee pain 24)  Sedapap 50-650 Mg Tabs (Butalbital-acetaminophen) .... One tablet by mouth two times a day as needed for headache 25)  Prempro 0.3-1.5 Mg Tabs (Conj estrog-medroxyprogest ace) .... One tablet by mouth daily  Other Orders: Capillary Blood Glucose/CBG [id number])  Patient Instructions: 1)  Headache - may be due to perimenopause symptoms (decrease in estrogen) 2)  will start low dose estrogen and progesterone tablet 3)  May take sedapap as needed until the level of hormone builds up in your system Prescriptions: SEDAPAP 50-650 MG TABS (BUTALBITAL-ACETAMINOPHEN) One tablet by mouth two times a day as needed for headache  #30 x 0   Entered and Authorized by:   Lehman Prom FNP   Signed by:   Lehman Prom FNP on 10/11/2010   Method used:   Print then Give to Patient   RxID:   [id number] PREMPRO 0.3-1.5 MG TABS (CONJ ESTROG-MEDROXYPROGEST ACE) One tablet by mouth daily  #30 x 5   Entered and Authorized by:   Lehman Prom FNP   Signed by:   Lehman Prom FNP on 10/11/2010   Method used:   Print then Give to Patient   RxID:   [id number]    Orders Added: 1)  Capillary Blood Glucose/CBG [24401] 2)  Est. Patient Level III [02725]

## 2010-10-17 LAB — URINE CULTURE: Culture  Setup Time: 201109130131

## 2010-10-17 LAB — URINALYSIS, ROUTINE W REFLEX MICROSCOPIC
Glucose, UA: NEGATIVE mg/dL
Leukocytes, UA: NEGATIVE
Nitrite: NEGATIVE
Specific Gravity, Urine: 1.02 (ref 1.005–1.030)
pH: 5 (ref 5.0–8.0)

## 2010-10-17 LAB — DIFFERENTIAL
Basophils Absolute: 0 10*3/uL (ref 0.0–0.1)
Basophils Relative: 1 % (ref 0–1)
Eosinophils Absolute: 0 10*3/uL (ref 0.0–0.7)
Eosinophils Relative: 1 % (ref 0–5)
Lymphocytes Relative: 30 % (ref 12–46)
Lymphs Abs: 1.4 10*3/uL (ref 0.7–4.0)
Monocytes Absolute: 0.6 10*3/uL (ref 0.1–1.0)
Monocytes Relative: 12 % (ref 3–12)
Neutro Abs: 2.7 10*3/uL (ref 1.7–7.7)
Neutrophils Relative %: 57 % (ref 43–77)

## 2010-10-17 LAB — BASIC METABOLIC PANEL
BUN: 20 mg/dL (ref 6–23)
Calcium: 8.3 mg/dL — ABNORMAL LOW (ref 8.4–10.5)
Creatinine, Ser: 2.15 mg/dL — ABNORMAL HIGH (ref 0.4–1.2)
GFR calc non Af Amer: 24 mL/min — ABNORMAL LOW (ref >60)
Glucose, Bld: 193 mg/dL — ABNORMAL HIGH (ref 70–99)

## 2010-10-17 LAB — CBC
HCT: 41.4 % (ref 36.0–46.0)
MCHC: 34.2 g/dL (ref 30.0–36.0)
Platelets: 151 10*3/uL (ref 150–400)
RDW: 14.5 % (ref 11.5–15.5)
WBC: 4.8 10*3/uL (ref 4.0–10.5)

## 2010-10-17 LAB — URINE MICROSCOPIC-ADD ON

## 2010-10-17 LAB — RAPID URINE DRUG SCREEN, HOSP PERFORMED
Cocaine: NOT DETECTED
Tetrahydrocannabinol: NOT DETECTED

## 2010-10-17 LAB — ETHANOL: Alcohol, Ethyl (B): <5 mg/dL (ref 0–10)

## 2010-10-23 ENCOUNTER — Telehealth (INDEPENDENT_AMBULATORY_CARE_PROVIDER_SITE_OTHER): Payer: Self-pay | Admitting: Nurse Practitioner

## 2010-10-29 ENCOUNTER — Telehealth (INDEPENDENT_AMBULATORY_CARE_PROVIDER_SITE_OTHER): Payer: Self-pay | Admitting: Nurse Practitioner

## 2010-10-31 NOTE — Progress Notes (Signed)
Summary: question re med  Phone Note Call from Patient Call back at [phone]   Summary of Call: [phone] call pat regarding where she started last pack of Prempro? Initial call taken by: Ernestine Mcmurray,  October 23, 2010 8:11 AM  Follow-up for Phone Call        Unsure of the question  - prempro was ordered 10/11/2010 Rx was printed and given to pt at the same time she was given for sedapap. she has refills on the medication so she should be able to get from the pharmac Not time for the sedapap - given rx on 10/11/2010 Follow-up by: Lehman Prom FNP,  October 23, 2010 11:42 AM  Additional Follow-up for Phone Call Additional follow up Details #1::        States she's used sedapap twice a day from the day she picked it up -- will run out over the weekend.  Has not had headache on waking, but it comes on later in day.  Two of the sedapap are working fine for her headache.  Also says she started the Prempro pack at the wrong end ... started taking from day 30 backwards xtwo weeks before she noticed her error.  Will if make a difference if she starts taking it now from day 1? Additional Follow-up by: Dutch Quint RN,  October 23, 2010 4:05 PM    Additional Follow-up for Phone Call Additional follow up Details #2::    all the dosages should be the same so it should be ok Follow-up by: Lehman Prom FNP,  October 23, 2010 4:50 PM  Additional Follow-up for Phone Call Additional follow up Details #3:: Details for Additional Follow-up Action Taken: Pt. notified. Is asking for a refill on her sedpap last filled 10/10/09 # 30  instructions say may take 2 as needed and she said she has had to take 2 regularly. Gaylyn Cheers RN  October 24, 2010 12:58 PM   I will refill on next week - I pt wants to be technical if she was given 30 on 10/11/2010 then they should last 15 days and she should not be out until 10/26/2010.  Pt is notorious for taking more medications than as order states.  She is on a polypharmacy of  meds so her refills will be limited.  I will refill on Monday 10/28/2010 and the next 30 tabs will need to last her for 1 month.  If headaches are truely hormonal as she indicates then since she has been taking hormone pills then her need for the sedapap should have decreased.  n.martin,fnp October 24, 2010 1:04 PM  Left message on answer machine for pt. to return call. Gaylyn Cheers RN  October 24, 2010 2:49 PM   Pt. advised of provider's response.  Verbalized understanding and agreement.  Says she didn't have a headache yesterday, but will be out of sedapap tomorrow.  I did confirm with her that if she was having less headaches, then the Prempro was helping - she agreed.   Additional Follow-up by: Dutch Quint RN,  October 25, 2010 5:21 PM

## 2010-11-05 NOTE — Progress Notes (Signed)
Summary: PLS CK DIRECTIONS  Phone Note Call from Patient Call back at [facility] Phone [phone]   Summary of Call: PT WANTS TO KNOW WHY HER DIRECTIONS WERE CHANGED FOR BUPAP. PT SAID IT USE TO SAY TAKE 1 2XDAY AS NEEDED FOR HEADACHE BUT PT SAID HER BOTTLE NOW SAYS TAKE 1 DAILY MUST LAST 30 DAYS.  PT WANTS SOMEONE TO CHECK ON THIS CAUSE SOME DAYS SHE HAS TO TAKE TWO. Initial call taken by: Ayesha Rumpf,  October 29, 2010 9:31 AM  Follow-up for Phone Call        Left message on answering machine for pt. to return call. Dutch Quint RN  October 29, 2010 10:26 AM  Advised re conversation we had last week where Prempro seemed to help with headaches, so need for sedapap should be less and she was going to have medication decreased to one/day.  Had verbalized understanding and agreement at that time -- states she does not remember that part.  Has headache every morning, takes tylenol/ibuprofen during the day, does not help.  Advised that sedapap has tylenol in it, to limit ALL tylenol to no more than 3000 mg. per day.  Also suggested to pt. that she might try tylenol ES when she first gets up in the early morning, then take sedapap around lunch-time since she states HA worsen after lunch.  Also advised to try loratadine daily as high pollen count could be adding to her headache.  States it makes her nose and mouth dry -- suggested her using mints or candies for mouth, drink more water, and use cool-mist humidifier.  States she can't afford humidifier, uses pans of water. Also suggested she can run hot water in sink and put towel over her head -- breathe in mist to help with congestion as it occurs and to moisten nasal passages.  Can also use saline spray.  Verbalized understanding and agreement.  LONG discussion why medications are changed, even though they seem to be working, instructed as to risks vs. benefits, impact on other body systems and that she has history of taking more medication than is ordered.   (she laughed in agreement).   Again, reminded her that prempro seems to be helping, to give change of medication time to work and work on being more positive with changes.  Reminded her that change of medications are not going to work instantly, to give some time for body to adjust.  Says she understands, seems to understand, still wants her sedapap changed back to where it was.  Agrees to try changes as we discussed. Follow-up by: Dutch Quint RN,  October 29, 2010 12:12 PM  Additional Follow-up for Phone Call Additional follow up Details #1::        The bottom line is that pt it taking too much and too many meds. thanks for speaking with pt and for helping her understand Additional Follow-up by: Lehman Prom FNP,  October 29, 2010 1:53 PM

## 2010-11-06 LAB — CBC
HCT: 40.1 % (ref 36.0–46.0)
Hemoglobin: 13.9 g/dL (ref 12.0–15.0)
MCV: 93.4 fL (ref 78.0–100.0)
RDW: 12.7 % (ref 11.5–15.5)

## 2010-11-06 LAB — GLUCOSE, CAPILLARY: Glucose-Capillary: 179 mg/dL — ABNORMAL HIGH (ref 70–99)

## 2010-11-06 LAB — APTT: aPTT: 29 seconds (ref 24–37)

## 2010-11-07 LAB — BASIC METABOLIC PANEL
Calcium: 8.8 mg/dL (ref 8.4–10.5)
Creatinine, Ser: 1.27 mg/dL — ABNORMAL HIGH (ref 0.4–1.2)
GFR calc Af Amer: 55 mL/min — ABNORMAL LOW (ref >60)
Sodium: 142 mEq/L (ref 135–145)

## 2010-11-07 LAB — GLUCOSE, CAPILLARY
Glucose-Capillary: 101 mg/dL — ABNORMAL HIGH (ref 70–99)
Glucose-Capillary: 103 mg/dL — ABNORMAL HIGH (ref 70–99)
Glucose-Capillary: 24 mg/dL — CL (ref 70–99)
Glucose-Capillary: 96 mg/dL (ref 70–99)

## 2010-12-17 NOTE — Consult Note (Signed)
Monica [name], Monica [name] NO.:  [id number]   MEDICAL RECORD NO.:  [id number]          PATIENT TYPE:  INP   LOCATION:  2923                         FACILITY:  [facility]   PHYSICIAN:  Antonietta Breach, M.D.  DATE OF BIRTH:  29-Apr-1961   DATE OF CONSULTATION:  03/15/2008  DATE OF DISCHARGE:                                 CONSULTATION   REQUESTING PHYSICIAN:  [facility].   REASON FOR CONSULTATION:  Severe agitation as well as opiate withdrawal  and depression with suicide attempt.   HISTORY OF PRESENT ILLNESS:  Monica [name] is a 50 year old female  [facility], after an  overdose.   Monica [name] has had approximately 4 weeks of depressive symptoms  including depressed mood, increasing suicidal ideation.  She has been  using multiple bags of heroin per day.  Prior to admission she took an  overdose of Klonopin and Neurontin in an attempt to kill herself.  She  took 30 1 mg Klonopin and 66 100 mg Neurontin tablets.   Recently Monica [name] was seen in the [facility] emergency department  for suicidal ideation.  She was released.  This is by medical record  report.  Monica [name] has been in outpatient care.  She has been  receiving Seroquel 300 mg daily at bedtime and 25 mg q.i.d.  She also  has been receiving Klonopin.   Monica [name] became severely violent, threatening the staff today.  She threw many objects in her room including picking up the table.  She  had stated that the next time she will succeed in killing herself.  She  has required Haldol 5 mg and Ativan 4 mg to calm her down.   PAST PSYCHIATRIC HISTORY:  Monica [name] does have a history of  multiple suicide attempts in review of the past medical record.  She has  been admitted to the [facility] in January 2007 as  well as January of 2009.   PRIOR DIAGNOSES:  Include polysubstance dependence.  She does have a  history documented  in the past of 6 bags of heroin per day.  She also  has a history of crack abuse.   In May of 2001 she was listed at that time to have a heroin addiction.  She also has carried a diagnosis of anxiety listed in the past medical  record.   FAMILY PSYCHIATRIC HISTORY:  None known.   SOCIAL HISTORY:  Monica [name] is divorced.  She does have a 27-year-old  daughter.  She has a boyfriend who brought her to the emergency  department.  She is very angry with him for bringing her to medical  care.   PAST MEDICAL HISTORY:  Status post overdose as listed above,  hypertension, diabetes mellitus type 1, neuropathy, hepatitis C.   ALLERGIES:  Include NEFAZODONE.  Other allergies include WELLBUTRIN,  NAPROXEN, OTHER NONSTEROIDAL ANTI-INFLAMMATORY DRUGS, [employer] INHIBITORS.   MEDICATIONS:  1. The [facility] is reviewed.  She is currently on Catapres 0.1 mg b.i.d.  2. She has received 2 mg of Ativan.  3. She  has received Haldol 5 mg.  4. An additional 2 mg of Ativan.   LABORATORY DATA:  WBC 6.0, hemoglobin 11.9, platelet count 169, sodium  139, potassium 3.9, BUN 8, creatinine 0.93, glucose 215.  Urine drug  screen was positive for benzodiazepines and opiates.  Alcohol and  Tylenol negative.   REVIEW OF SYSTEMS:  Constitutional, HEENT, neurological, psychiatric,  all unremarkable.  CARDIOVASCULAR:  EKG QTC is 405 milliseconds.  Respiratory, gastrointestinal, genitourinary, skin, musculoskeletal,  hematologic lymphatic, endocrine and metabolic all unremarkable.   PHYSICAL EXAMINATION:  VITAL SIGNS:  Temperature 97.1, pulse 77,  respiratory rate 20, blood pressure 120/83.  GENERAL APPEARANCE:  Monica [name] is a middle-aged female sitting up  in her hospital chair with no abnormal involuntary movements.  MENTAL STATUS:  Monica [name] is alert.  She is oriented to all  spheres.  Her attention span is mildly decreased.  Concentration mildly  decreased.  Eye contact is good.  Affect is very expansive.   Mood is  very angry.  Formal questions on memory were not possible, however, the  patient does recall her heroin use.  She also recalls aspects of her  past history including her psychiatric care.  However, these comments  are brief and she is very demanding at the time of the questions.  Her  speech is mildly pressured with normal prosody.  There is no dysarthria.  Thought process is mildly increased in speed.  It is logical, coherent  and goal-directed.  Thought content, please see the history of present  illness.  She does not appear to be having internal stimulation or  delusions.  Insight is poor.  Judgment is impaired.   ASSESSMENT:  AXIS I:  293.83, mood disorder in conditions classified  elsewhere.  Monica [name] may have a form of bipolar disorder.  However, it is difficult to assess this early in her course.  Opioid  dependence.  History of polysubstance abuse.  AXIS II:  Deferred.  AXIS III:  See past medical history.  AXIS IV:  Primary support group.  AXIS V:  15.   Monica [name] is at risk to harm herself and others.   The undersigned concurs with administering Haldol 5 mg q.4 hours p.r.n.  severe agitation along with Ativan 2-4 mg q.4 hours p.r.n.  The  undersigned has restarted her clonidine in the form of the clonidine  withdrawal protocol leaving out the nonsteroidal anti-inflammatory drugs  as well as the Librium.  Would continue suicide precautions with the  sitter near the door for escape if needed.  Security as needed.  Commitment papers.  Admit to a psychiatric inpatient unit for further  evaluation and treatment as soon as possible.  Attempt low stimulation  ego support in an effort to facilitate a therapeutic alliance.      Antonietta Breach, M.D.  Electronically Signed     JW/MEDQ  D:  03/15/2008  T:  03/15/2008  Job:  [id number]

## 2010-12-17 NOTE — Discharge Summary (Signed)
NAMEDEMONI, PARMAR            ACCOUNT NO.:  [id number]   MEDICAL RECORD NO.:  [id number]          PATIENT TYPE:  INP   LOCATION:  2923                         FACILITY:  [facility]   PHYSICIAN:  Marcellus Scott, MD     DATE OF BIRTH:  Dec 10, 1960   DATE OF ADMISSION:  03/14/2008  DATE OF DISCHARGE:                               DISCHARGE SUMMARY   DATE OF DISCHARGE:  To be determined.   PRIMARY MEDICAL DOCTOR AND PSYCHIATRIST:  Daine Floras, M.D.   DISCHARGE DIAGNOSES:  1. Psychiatry - mood disorder/? bipolar disorder.  2. Drug overdose - Klonopin and Neurontin.  3. Suicide attempt.  4. Uncontrolled Type 1 DM  5. Hypertension  6. Opoid dependance, now with withdrawal  7. Tobacco Abuse.  8. Chronic Anemia.  9. Acute Renal Failure- resolved.  10.Polysubstance abuse.  11.History of Hepatitis C   DISCHARGE MEDICATIONS:  1. Enteric-coated aspirin 81 mg p.o. daily.  2. Clonidine opiod withdrawal protocol without Ibuprofen, Librium,      Naprosyn or other NSAIDs. Patient is day 2 of this protocol on      03/16/2008. (Please see enclosed  copy of the protocol)  3. Ativan 1 to 2 mg p.o. or IM or IV q.4h. p.r.n. for agitation.  4. Haloperidol 5 mg IM or IV q.4h. p.r.n. for acute agitation.  5. NovoLog sensitive sliding scale insulin.  6. Lantus 10 units subcutaneously at 10pm on 03/16/2008.  7. Lantus 25 units subcut QHS from 03/17/2008  8. Novolog 2 units mealtime subcut t.i.d  9. Nicotine 21 mg per day patch 1 daily.  10.Protonix 40 mg p.o. daily.  11.Senokot S 1 p.o. every night.  12.Tylenol 650 mg p.o. q.4h. p.r.n.   PROCEDURES:  On August 11, chest x-ray, impression:  No acute findings  in a low-volume chest.   PERTINENT LABORATORY DATA:  BMET today with BUN 8, creatinine 0.93,  glucose 215, electrolytes normal. Urinalysis negative for features of  urinary tract infection. Blood alcohol level less than 5. Blood  acetaminophen level less than 10. CBC with hemoglobin 11.9,  hematocrit  35, white blood cells 6, platelets 169. Urine drug screen positive for  benzodiazepines and opiates.   CONSULTATIONS:  Psychiatry, Dr. Jeanie Sewer.   HOSPITAL COURSE AND PATIENT DISPOSITION:  Please refer to the history  and physical note for initial admission details. In summary, Ms.  Runkles is a 50 year old Caucasian female patient with history of  anxiety disorder/bipolar disorder, polysubstance abuse, tobacco abuse,  hypertension, type 1 diabetes with neuropathy, hepatitis C, depression,  multiple attempts at suicide in the past who overdosed on 30 tablets of  1-mg Klonopin and 60 tablets of 600-mg Neurontin on the night prior to  admission in attempt to commit suicide. She took the an hour prior to  arrival to the emergency room. Her case was discussed by the ED MD with  poison control department who recommended close monitoring and  supportive care. On arrival to the emergency room, she had stable vital  signs but was drowsy with change in mental status. She was thereby  admitted to the step-down unit for close  monitoring.   1. Drug overdose. The patient was admitted to the hospital. She was      monitored closely on telemetry with no arrhythmia alarms. Since      this morning, she has been awake, alert, oriented. Hepatic panel      was unremarkable. I discussed her case again with poison control on      8/12 and they indicated that she did not need any further      monitoring or checking lab data.  2. Attempts at suicide (recurrent). The patient has a Production manager. Psychiatry has consulted. Please refer to the detailed      notes for further recommendations. She is assesed to be at risk to      harm herself and or others. Dr. Merlyn Albert has recommended transfer      to inpatient psychiatry facility when medically cleared.  3. Mood disorder/? Bipolar disorder. At approximately 1 p.m. this      afternoon, the patient became very agitated, angry, and  violent,      throwing things around in her room and using profanity. She even      threw down the furniture, her lunch tray, stripped down the bed,      and locked herself in the bathroom. Security was called and      responded. Extensive discussions were had with Dr. Jeanie Sewer. The      patient was deemed to be at risk for harm to herself and others. On      the psychiatrist's recommendations, she was involuntarily      committed, awaiting transfer to an inpatient psychiatric facility.      At this point, the patient is medically cleared and stable for      transfer to an inpatient psychiatric facility when beds are      available. Dr. Jeanie Sewer recommended p.r.n doses of Ativan or      Haloperidol for agitation. Seroquel, Klonopin & Neurontin were      discontinued  4. Uncontrolled Type 1 diabetes. Will adjust Lantus, add meal time      novolog to sliding scale and monitor CBGs t.i.d AC & HS. The      patient's home insulin is 27 units of Lantus. Consider titrating it      up as the CBGs indicate. She obviously had poor outpatient DM      control. Lasix also held.  5. Hypertension is controlled. The patient was on lisinopril as an      outpatient which is currently held, especially given that patient      is on clonidine, and her blood pressures are normal. Consider      resuming lisinopril at a later time.  6. Opoid dependance, now with withdrawal symptoms: Patient uses      significant amount of heroin. She has sneezing, running nose,      chills and is started opn clonidine protocol.  7. Left elbow pain: unsure if she bumped her elbow during her agitated      episeode. Xray neg for acute findings.   Addendum: BMET 03/16/2008 only significant for glucose of 302 and LFT's  significant for Albumin 3.1, protein 5.8. CBC: Hemoglobin 11.6,  Hematocrit 34.9, WBC 6.2, plat 161. HbA1C 7.3, TSH 0.365.      Marcellus Scott, MD  Electronically Signed     AH/MEDQ  D:  03/15/2008  T:   03/15/2008  Job:  [id number]   cc:   Fayrene Fearing  [place], M.D.

## 2010-12-17 NOTE — H&P (Signed)
Monica, [name]            ACCOUNT NO.:  [id number]   MEDICAL RECORD NO.:  [id number]          PATIENT TYPE:  INP   LOCATION:  1827                         FACILITY:  [facility]   PHYSICIAN:  Darryl D. Prime, MD    DATE OF BIRTH:  April 12, 1961   DATE OF ADMISSION:  03/14/2008  DATE OF DISCHARGE:                              HISTORY & PHYSICAL   CODE:  Full Code.   PRIMARY CARE PHYSICIAN:  Monica [name], M.D., although in her  medical history, he was also her psychiatrist.   TOTAL VISIT TIME:  Approximately 64 minutes.   CHIEF COMPLAINT:  The patient could not give a chief complaint.  History  was gathered from her live-in boyfriend.  The patient's boyfriend notes  that she took many pills including Klonopin and Neurontin, as apparently  she noted prior that she did not want to live anymore.   HISTORY OF PRESENT ILLNESS:  Monica [name] is a 50 year old female with  a history of polysubstance abuse.  She also has a history of multiple  suicidal attempts in the past.  She presents with the above.  The  patient has a history of polysubstance abuse using as much as 6 bags of  heroin daily intravenously.  History of crack cocaine use.  The patient  over the last month, her boyfriend has been having significant suicidal  ideation and references to killing herself multiple times per the  boyfriend.  The boyfriend notes over the past few days this has been  more significant.  She was apparently seen at the emergency room at  [facility].  Recently discharged from there.  The reason for the visit was  suicidal ideation, but no attempt.  The patient apparently on today was  very frustrated with her daughter, a 53-year-old and apparently while at  work by her boyfriend, who has a Building services engineer, was very frustrated as  well.  She went home to get a sandwich and apparently took, per her  boyfriend's history, Klonopin 1 mg tablets, 30 of them, and Neurontin 60  tablets of 600 mg.  The  patient had no loss of consciousness.  She was  drowsy in the ER.  She denies having any other complaints.  No nausea or  vomiting, diarrhea, black stools or bloody stools.  She has had no  fever.  The patient could not give a verbal history.   PAST MEDICAL HISTORY AND PAST SURGICAL HISTORY:  As above.  She has a  history of anxiety disorder, not otherwise specified.  History of  bipolar disorder.  History of polysubstance abuse as above.  History of  tobacco abuse, history of hypertension.  History of diabetes mellitus,  type 1 on Insulin with neuropathy, particularly in the upper  extremities.  She has been diabetic for greater than 10 years.  History  of hepatitis C.  History of bilateral pneumothorax.  History of  depression with multiple suicidal attempts in the past.   ALLERGIES:  NAPROXEN, WHICH CAUSES LOCALIZED SWELLING.   MEDICATIONS:  She is on Neurontin 600 mg 3 times a day, baby aspirin 81  mg  daily, omeprazole 20 mg daily, Seroquel XL 300 mg nightly.  Seroquel  25 4 times a day.  Lasix 20 mg twice a day, Colace 100 mg daily, Valtrex  500 mg daily.  She is on Lunesta 3 mg nightly.  She is on Lantus insulin  27 units nightly, lisinopril 10 mg daily.  The patient apparently has  been on Klonopin in the past, but she was taken off this and was told  not to take Klonopin.   SOCIAL HISTORY:  As above.  She lives with her boyfriend currently.  Tobacco abuse 1 pack per day, unsure of how long.   FAMILY HISTORY:  Per her boyfriend, no chronic medical problems in the  mother or father.  No psych history per the medical record.   REVIEW OF SYSTEMS:  Could not be obtained secondary to drowsiness.   PHYSICAL EXAMINATION:  VITAL SIGNS:  Temperature is 97.1 with a blood  pressure of 120/83.  Pulse is 77.  Respiration of 14.  Sats are 100% on  room air.  GENERAL:  The patient is a thin female, lying in bed, drowsy, but  arousable upon shaking.  HEENT:  Normocephalic, atraumatic.   Her pupils are equal, round, and  reactive to light.  Pupils are not dilated.  There are 3 mm bilaterally.  They are not pin point.  The patient's oropharynx shows no posterior  pharyngeal lesions.  LUNGS:  Clear to auscultation bilaterally.  CARDIOVASCULAR:  Regular rhythm and rate with no murmurs, rubs, or  gallops.  Normal S1 and S2.  No S3 or S4.  No jugular venous distension.  No carotid bruits.  ABDOMEN:  Soft, nontender, nondistended, with no hepatosplenomegaly;  EXTREMITIES:  Show no clubbing, cyanosis, or edema.  NEUROLOGIC:  The patient is unable to verbalize.  She does awaken on  shaking and she does track well with her eyes.   LABORATORY DATA:  The patient's urine drug screen is positive for  opiates and benzodiazepine.  Alcohol is less than 5.  Acetaminophen is  less than 10.  CBC showed a white count of 6.0 with a hemoglobin of  11.9, hematocrit 35.  Platelets of 169, segs are 64%.  The patient's I-  Stat showed a CO2 of 27, chloride 107, glucose 354, BUN 11, creatinine  1.3 up from 1.06, potassium is 4.1 with a sodium of 139.  The patient's  chest x-ray is pending.  EKG, shows sinus rhythm, an event rate of 67  beats per minute, DC corrected April 2005.  No major change from  February of 2007 EKG.   ASSESSMENT AND PLAN:  This is a patient who has had an overdose as  above, with possible suicidal attempt.  She will be admitted to the  intensive care unit and on suicidal precautions, and will try to get a  psychiatric consult in the morning.  Will follow her neuro status, vital  signs closely and for her diabetes will follow her blood sugar closely  and place her on a lower dose of Lantus.  She will be on a sensitive  sliding scale insulin and intravenous fluid.  For her acute renal  insufficiency, will give her intravenous fluid and follow her  creatinine.  Prophylaxis will be with Heparin to prevent deep venous  thrombosis and gastrointestinal prophylaxis will be with  proton pump  inhibitor intravenous.      Darryl D. Prime, MD  Electronically Signed     DDP/MEDQ  D:  03/14/2008  T:  03/14/2008  Job:  [id number]
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
## 2010-12-17 NOTE — Consult Note (Signed)
Monica [name], Monica [name]            ACCOUNT NO.:  [id number]   MEDICAL RECORD NO.:  [id number]          PATIENT TYPE:  INP   LOCATION:  2923                         FACILITY:  [facility]   PHYSICIAN:  Antonietta Breach, M.D.  DATE OF BIRTH:  12/12/1960   DATE OF CONSULTATION:  03/16/2008  DATE OF DISCHARGE:                                 CONSULTATION   REPORT TITLE:  INPATIENT CONSULTATION FOLLOW-UP    Monica [name] continues with severe impaired judgment.  She also has  labile mood and agitation.  She has already required 5 mg of Haldol  today along with 2 mg of Ativan.  She is on the clonidine protocol.  She  is having myalgias and rhinorrhea.   REVIEW OF SYSTEMS:  NEUROLOGIC:  No sweating.   LABORATORY DATA:  Sodium 136, potassium 3.9, BUN 12, creatinine 0.84,  glucose 302, SGOT 19, SGPT 20, WBC 6.2, hemoglobin 11.6, platelet count  161.   PHYSICAL EXAMINATION:  VITAL SIGNS:  Temperature 98.4, pulse 60,  respiratory rate 18, blood pressure 115/69.  O2 saturation on room air  98%.   MENTAL STATUS EXAM:  Monica [name] is alert.  Her affect is anxious.  Her mood is irritable.  Thought process is coherent.  She has impaired  judgment, as well as poor insight.  She is calmer after receiving the  Haldol and Ativan and does not appear to be having any adverse effects.   ASSESSMENT:  1. 293.83, mood disorder, not otherwise specified.  2. Opioid dependence.   Monica [name] has continued to display impaired judgment, labile mood  and severe agitation requiring a chemical restraint.  She has been  uncooperative with care.   She will require petition for involuntary commitment.  Would continue  Haldol 5 mg with Ativan 5 mg p.r.n. as written for anti-agitation and  combativeness.   If she displays any extrapyramidal symptoms, would utilize Cogentin 1-2  mg p.o. or IM b.i.d. p.r.n.   Low stimulation ego support in an effort to facilitate a therapeutic  alliance.     Antonietta Breach, M.D.  Electronically Signed    JW/MEDQ  D:  03/16/2008  T:  03/16/2008  Job:  [id number]

## 2010-12-20 NOTE — Discharge Summary (Signed)
NAME:  Monica [name], Monica [name]                      ACCOUNT NO.:  [id number]   MEDICAL RECORD NO.:  [id number]                   PATIENT TYPE:  INP   LOCATION:  0372                                 FACILITY:  [facility]   PHYSICIAN:  Isla Pence, M.D.             DATE OF BIRTH:  01-Apr-1961   DATE OF ADMISSION:  02/08/2004  DATE OF DISCHARGE:  02/09/2004                                 DISCHARGE SUMMARY   DISCHARGE DIAGNOSES:  1. Hypoglycemia secondary to taking her insulin without having eaten.  2. Cocaine abuse with positive drug screen for cocaine.  3. History of hepatitis C.  4. Diabetes mellitus type 1.  5. History of heroin addition for which she is being [facility] now.  6. History of anxiety and depression.  7. History of hypertension.  The patient is to currently hold Mavik and     Lasix because her blood pressure is on the lower side of normal.  8. History of respiratory arrest with prolonged intubation at [facility] in October of 2004.  9. Sinus bradycardia noted during her telemetry time here, but the patient     is asymptomatic.  10.      The patient tells [place] of an enlarged uterus from a previous visit in     a clinic in [place].  This is to be further evaluated by her [facility] doctor.   DISCHARGE SUMMARY:   DISCHARGE MEDICATIONS:  (The patient is to hold her Lasix, but she normally  takes 20 mg p.o. b.i.d. for fluid retention, and her Mavik, which is usually  1 mg p.o. daily, until her blood pressure comes back up to greater than  130/60.)  She is to restart her home medications, which consist of -  1. Klonopin 1 mg by mouth two times a day.  2. Methadone, and she tells [place] that she takes 95 mg p.o. daily.  She gets     this administered at the methadone clinic.  3. Effexor 150 mg one p.o. daily.  4. Seroquel.  She takes anywhere from 100 to 200 mg is what she tells [place],     once a day, which was prescribed by Dr.  Betti Cruz.  5. NPH 25 units subcutaneous q.a.m. with breakfast and 20 units subcutaneous     every evening.  She says she normally takes it at about 8 p.m. so as to     minimize hypoglycemic events early in the morning.  She has been advised     that she should eat her dinner, and she should always take her insulin     with meals, and never to take insulin without having eaten.  (Also recommend Ecotrin 81 mg p.o. daily.  Would also recommend a statin,  since she is a diabetic.  Will leave this to the physician at [facility]  to initiate.  We have not done this here.)   DISCHARGE ACTIVITY:  As tolerated.   DISCHARGE DIET:  She is to be on an 1800 calorie diabetic diet, low fat.   SPECIAL INSTRUCTIONS:  The patient is to get her blood pressure checked at  the [facility] where she will be seen tomorrow.  She has been advised  to hold on the Lasix and Mavik for now.   FOLLOW UP:  With Dr. Fannie Knee [facility]inkard [facility].  I have  advised sooner, in 1 week, if the patient can get in, primarily for blood  pressure checks, but she tells me that she can get her blood pressure  checked at the [facility] also.  The patient is also to keep her  appointment with [facility].  She also tells me that she  goes to a [facility] for her addiction problems, and also [facility] for her addiction problems.  She is to continue with that.   HOSPITAL COURSE:  This 50 year old female was admitted to the [facility] service after being brought to the emergency room unresponsive.  Her blood pressure was noted to be 21.  She apparently had taken her insulin  without having eaten.  The patient was given an amp of D-50 which did not  wake the patient up.  She was subsequently given an amp of Narcan, which  then woke her up, and the patient subsequently became agitated that she  needed to be given 20 mg Geodon.  As part of her work-up for that, she  also  had a urine drug screen done, and that came back positive for cocaine and  benzodiazepines.  We do know that she normally takes Klonopin.  She also had  a chest x-ray done, and that shows mild just bibasilar atelectasis versus  aspiration, but clinically, there are no signs of aspiration.  She has been  afebrile.  Her breathing patterns have been normal.  She is not complaining  of shortness of breath, and her lung exam is clear, so at presentation she  probably just had some mild atelectasis.  Although she was agitated, she was  able to answer some simple questions of the examiner.  She was started on IV  fluids consisting of D5 half normal saline.  Once she started waking up, she  was fed, and her sugar started trending up on its own, and therefore the IV  fluids were switched to normal saline.  She was also started back on her  slightly lower dose of her NPH regimen.  With that regimen, however, her  sugars were starting to climb up into the 200 to the mid-200 ranges;  therefore, her insulin will be increased back to her usual dose that she  will be taking at home.  The patient has been advised not to take her  insulin without having eaten.  She tells me that she normally gives herself  the evening insulin around 8 p.m., but she attempts to eat dinner; however,  she had not eaten that day, since she stated it was too hot for her to eat.  I recommended that she not do that, and that she should always eat prior  taking her insulin.  She notes that she normally takes her evening insulin  at about 8 p.m., since, if she takes it sooner than that, then sometime  early in the morning her sugars bottom out.  When she  takes her insulin at  about 8 p.m., she does take some cereal with it.   The patient was also noted to be hypokalemic, and this was adequately  replaced orally.  At the time of discharge, her potassium is normal at 3.8.  During her stay here, the patient was noted to be  having blood pressures  that were on the lower side of normal.  Therefore, her Lasix and Mavik were  not restarted back.  It has continued to stay in the systolic range of the  upper 80's and diastolic in the mid-60's.  The patient tells me that she  normally tends to run high on her blood pressure.  I have advised her to  hold her Mavik and Lasix until her blood pressure comes up to the range as  mentioned earlier.  The patient was switched from D-5 half normal to normal  saline.  In light of the low blood pressures, however, she has not wanted  the IV fluids, and therefore we have discontinued the IV fluids.  The  patient is asymptomatic from the blood pressure standpoint.   The patient was also noted to be bradycardic, going sometimes as low at into  the upper 30's, but the patient has been asymptomatic during this entire  time.  She has also been asymptomatic with her blood pressure running on the  low side.  This can be further monitored by her [facility] physician.  Heart rate at the time of discharge is now running in the 50-58 range.   The patient was offered a rehabilitation program to go through drug  addiction, and she has declined that at this point in time, stating that she  goes to a women's group, and also goes through [facility].  She is  also being followed by Dr. Betti Cruz at the [facility].   Her BMP on again at the time of discharge shows a sodium of 139, potassium  of 3.8, chloride and codeine of 129 and 23.  BUN and creatinine of 4 and  0.7.  Calcium of 8.7.   ADMISSION LABORATORIES:  Her admission labs showed an alcohol level that was  less than 5.  Her BMP showed a sodium of 141, potassium of 3.0, chloride  110, CO2 of 26, glucose of 83.  BUN and creatinine of 8 and 0.8.  This  glucose was certainly after the D-50 was given.  A calcium of 8.5.  Her  hepatic function was entirely normal, except for an albumin of 3.4.  Her AST  and ALT were 31 and 25.   As mentioned earlier, the urine drug screen was  positive for cocaine and benzodiazepines.  Her urine pregnancy test was  negative.  Her CBC was normal with a white count of 6.4, hemoglobin and  hematocrit of 12.8 and 37.2, platelet count of 173,000.  Her UA was positive  for glucose and a small amount of bilirubin.  Otherwise, they were  essentially negative.   Please note that the patient tells me today that when she was living in  [place] she had a physician there, and apparently had a pelvic ultrasound,  and was told that her uterus was slightly enlarged.  I recommended that she  see her [facility] doctor to get this further evaluated.   The patient is being discharged in stable condition.  The patient is anxious  to leave also.  We will give her her Klonopin dose prior to discharge today.  She will  take the rest of her medications, as in Effexor and anasarca once  she gets home.                                              Isla Pence, M.D.    RRV/MEDQ  D:  02/09/2004  T:  02/10/2004  Job:  [id number]   cc:   Dr. Margarette Canada  [facility]   Dr. Betti Cruz  [facility]

## 2010-12-20 NOTE — H&P (Signed)
   NAME:  Monica [name], Monica [name]                      ACCOUNT NO.:  [id number]   MEDICAL RECORD NO.:  [id number]                   PATIENT TYPE:  EMS   LOCATION:  ED                                   FACILITY:  [facility]   PHYSICIAN:  Vania Rea, M.D.              DATE OF BIRTH:  Oct 13, 1960   DATE OF ADMISSION:  05/12/2003  DATE OF DISCHARGE:  05/12/2003                                HISTORY & PHYSICAL   ADDENDUM:  The history and physical was dictated when it was planned for the  patient to be admitted, but due to deterioration of the patient while in the  emergency room, a decision was taken to transfer the patient to another  facility.  The patient had to be intubated and despite intubation,  hypoventilation and repeated doses of intravenous bicarbonate and a  bicarbonate drip, the patient's pH remained persistently below 6.9; as a  result, [facility] was consulted and they agreed to take the patient;  transfer was arranged.  The patient was seen only in the emergency room; the  patient was not admitted to [facility].     ___________________________________________                                         Vania Rea, M.D.   LC/MEDQ  D:  05/17/2003  T:  05/17/2003  Job:  [id number]

## 2010-12-20 NOTE — Op Note (Signed)
[facility]  Patient:    Monica [name], Monica [name] Visit Number: [id number] MRN: [id number]          Service Type: OBV Location: 4W 0470 01 Attending Physician:  Marlowe Shores Dictated by:   Artist Pais Mina Marble, M.D. Proc. Date: 07/07/01 Admit Date:  07/07/2001                             Operative Report  PREOPERATIVE DIAGNOSIS: Abscess left wrist volar.  POSTOPERATIVE DIAGNOSIS: Abscess left wrist volar.  OPERATION: Incision and drainage abscess, left wrist.  SURGEON: Artist Pais. Mina Marble, M.D.  ASSISTANT: None.  ANESTHESIA: General.  TOURNIQUET TIME: 18 minutes.  COMPLICATIONS: None.  DRAINS: None.  DESCRIPTION OF PROCEDURE: The patient was taken to the operating room after induction of adequate general anesthesia.  The left upper extremity was prepped and draped in the usual sterile fashion. The arm was elevated for 5 minutes, tourniquet was then inflated to 250 mmHg. At this point in time, a 4 cm longitudinal incision was made over an abscess and the volar aspect of the left wrist. The incision was made down through the skin and subcutaneous tissues. ______ subcutaneous tissues were incised.  There was purulent material, this was cultured, irrigated and debrided out. The incision was taken down to the palmaris longus tendon.  This was dbrided of a significant amount of hypertrophic tissue off of the FCR tendon and off of the median nerve. These were all debrided until there was no obvious sign of infection. This was then irrigation with antibiotic-impregnated saline and then a liter of normal saline. It was then closed with one suture, 4-0 nylon in the middle to cover the nerve and packed with Xeroform, and then a sterile dressing of 4 x 4 Fluffs, Coban wrap and a compression dressing. The patient tolerated the procedure well and went to recovery in a stable fashion. Dictated by:   Artist Pais Mina Marble, M.D. Attending Physician:   Marlowe Shores DD:  07/07/01 TD:  07/08/01 Job: [id number]

## 2010-12-20 NOTE — Discharge Summary (Signed)
[facility]  Patient:    Monica [name], Monica [name]                   MRN: [id number] Adm. Date:  [id number] Disc. Date: [id number] Attending:  Edwyna Perfect Dictator:   Raynelle Jan CC:         Estill Batten. Deirdre Priest, M.D.             Roseanna Rainbow, M.D.                           Discharge Summary  DATE OF BIRTH:  [birth year]  DISCHARGE DIAGNOSES:  1. Intrauterine pregnancy at 15-4/7 weeks.  2. Gestational diabetes and type 1 diabetes.  3. Polysubstance abuse.  4. Hepatitis C.  DISCHARGE MEDICATIONS:  1. NPH 25 units in the morning and 20 units at bedtime.  2. Paxil 10 mg p.o. q.d.  3. Methadone as prescribed by the methadone maintenance program.  4. Ambien 5 mg q.h.s. p.r.n.  5. Sliding scale insulin based on her fingerstick blood sugars.  FOLLOW-UP:  She is to follow up with the high risk OB clinic in one week to see Dr. Tamela Oddi.  HISTORY OF PRESENT ILLNESS:  This is a 50 year old female, gravida 7, para 2-0-4-2, admitted at 15-4/7 weeks by ultrasound at a 10-week office visit, who presents with heroin addiction as well as type 1 diabetes.  She is currently in the methadone program at [facility]; however, is still using heroin.  She has the desire to quit and to enter [facility], an inpatient treatment facility in [place].  However, she is here to be taken off of her heroin prior to going to that program.  PAST MEDICAL HISTORY:  1. Type 1 diabetes.  2. History of gestational diabetes.  3. Polysubstance abuse.  4. History of hepatitis C.  5. History of HSV.  6. History of anxiety and depression.  7. TAB x 4.  8. HIV nonreactive.  9. Hepatitis B negative. 10. Rubella equivocal.  FAMILY HISTORY:  Both a grandmother and aunt have hypothyroidism.  SOCIAL HISTORY:  She is divorced.  Two children live with her ex-husband.  She works at [employer].  Smokes a pack a day.  Uses IV heroin.  Has a history of crack use.  REVIEW  OF SYSTEMS:  Subjective fevers, vomiting, and headache, as well as some right lower quadrant discomfort.  PHYSICAL EXAMINATION:  VITAL SIGNS:  Blood pressure 102/60, heart rate 90, respiratory rate 20. Fetal heart tones were in the 140s.  GENERAL:  Pleasant, in no distress.  HEENT:  Pupils equally reactive to light.  Extraocular movements intact.  NECK:  Supple.  Without lymphadenopathy.  CARDIOVASCULAR:  Regular rate and rhythm.  LUNGS:  Clear.  ABDOMEN:  Soft, gravid, nontender, with positive bowel sounds.  EXTREMITIES:  No edema.  HOSPITAL COURSE:  She was admitted to [facility] for her heroin addiction for detoxification as well as control of her gestational diabetes. She was placed on her home insulin regimen, checking CBGs q.a.c., q.h.s., and two hours postprandially; however, became hypoglycemic and, therefore, her q.h.s. NPH had to be decreased initially.  At the time of her discharge, she will be discharged on her home regimen as the patient feels that this is the best regimen to keep her sugars under control.  The patient was started on her current dose of methadone; however, became nauseated, felt bad,  agitated.  Her methadone dose was, therefore, increased to 55 mg with Atarax given p.r.n. for agitation.  However, on Dec 30, 1999, the patient was noted to be out of her room for a prolonged period of time. Prior to this, she was allowed to go to the entrance of the hospital to smoke. The [facility] from [facility] called the nursing station and stated that the patient had gone to the [employer] and received a methadone dose there.  This occurred after receiving her a.m. methadone dose here in the hospital.  It was discussed with the patient that while she was here at [facility] it would not be allowed for her to leave to seek medications and/or IV drugs; therefore, she was told that she may stay at [facility], however, would need to stay  either in her room or at the nursing station.  The patient became very angry and agitated and has decided to leave AMA.  She understands that by leaving AMA she will most likely not be able to go to [facility], as we cannot insure that she has been off of her heroin. The patient also understands that by continuing this addiction, she is harming her fetus as well as herself. DD:  12/30/99 TD:  12/30/99 Job: [id number]

## 2010-12-20 NOTE — Consult Note (Signed)
[facility]  Patient:    Monica [name], WINTERHALTER Visit Number: [id number] MRN: [id number]          Service Type: OBV Location: 4W 0470 01 Attending Physician:  Marlowe Shores Dictated by:   Artist Pais Mina Marble, M.D. Proc. Date: 07/07/01 Admit Date:  07/07/2001                            Consultation Report  PHYSICIAN REQUESTING CONSULTATION:  Dr. Earlyne Iba.  REASON FOR CONSULTATION:  Ms. 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  She is an otherwise pleasant 50 year old right-hand-dominant female with no known drug allergies.  CURRENT MEDICATIONS:  NPH insulin 25 units in the morning, 30 in the evening. She does not follow her diabetes closely and has not had a fingerstick glucose test in over three weeks.  FAMILY MEDICAL HISTORY:  Noncontributory.  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  PHYSICAL EXAMINATION:  GENERAL:  Well-developed, nourished female, pleasant, alert and oriented x 3.  EXTREMITIES:  On examination of her wrist and forearm area, she has a 3 x 5-cm abscess on the volar aspect which is fluctuant and tender to palpation.  She also has some mild streaking up her arm and some mild adenopathy in the antecubital fossa.  She has a 2+ radial pulse and brisk refill.  Skin is otherwise intact with no erythema, drainage or signs of infection elsewhere noted in her extremities.  IMPRESSION:  A [age]-year-old female with an abscess on the palmar aspect of her non-dominant left wrist with evidence of a lymphangitis as well.  RECOMMENDATIONS:  At this point, she is to be admitted for IV Unasyn; she was given a 3 g bolus in the emergency room.  She is  also going to the operating room this evening for an I&D and we will have the medical service consulted for management of her diabetes. Dictated by:   Artist Pais Mina Marble, M.D. Attending Physician:  Marlowe Shores DD:  07/07/01 TD:  07/08/01 Job: [id number]

## 2010-12-20 NOTE — Discharge Summary (Signed)
[facility]  Patient:    Monica [name], Monica [name]                   MRN: [id number] Adm. Date:  [id number] Disc. Date: [id number] Attending:  Antionette Char Dictator:   Pricilla Holm, M.D.                           Discharge Summary  CONSULTS:                     None.  PROCEDURES:                   Obstetrical ultrasound which showed posterior placenta grade 2, single breech presentation, amniotic fluid within normal limits, total 19.1, full fetal motion and anatomy recorded, cervical length 3.7 transabdominally.  HOSPITAL COURSE:              The patient is a 50 year old G7, P2-0-4-2, who was admitted at 20 weeks and 4 days confirmed by 5 week ultrasound on August 27.  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  Presented with chief complaint of vomiting x 4 days.  The patient was admitted for IV hydration and antiemetics.  Cervical exam on admission was closed, thick, and high.  Wet prep was unremarkable. GBS was pending.  GC and chlamydia pending.  UA no glucose, greater than 80 ketones, specific gravity 1.024, negative nitrites, negative leukocyte esterase and pH of 5.  Patient received IV fluids.  Phenergan helped substantially, and she was able to tolerate fluids without vomiting.  The patient denied contractions.  The patient also received Pepcid during her stay which gave her significant relief.  The patient was also started on her methadone as well.  On the morning of the 29th, the patient was found to be stable and doing well.  She denied loss of fluid, bleeding, uterine contractions, nausea or vomiting.  She did complain of some lower extremity edema, although she did not have any swelling in her face or hands.  Vital signs on the morning of discharge were stable, and the patient was afebrile. The patient was deemed appropriate to be discharged to home.  CONDITION ON DISCHARGE:        Good.  DISPOSITION:                  Discharged to home.  DISCHARGE MEDICATIONS:        Resume previous home medications.  DISCHARGE INSTRUCTIONS:       The patient was given instructions on preterm labor and signs and symptoms to warrant further treatment.  FOLLOW-UP:                    The patient will follow up next week in high risk clinic and will continue to be monitored closely. DD:  04/01/00 TD:  04/01/00 Job: [id number]

## 2010-12-20 NOTE — Consult Note (Signed)
   NAME:  Monica [name], Monica [name]                      ACCOUNT NO.:  [id number]   MEDICAL RECORD NO.:  [id number]                   PATIENT TYPE:  EMS   LOCATION:  ED                                   FACILITY:  [facility]   PHYSICIAN:  Barbaraann Barthel, M.D.              DATE OF BIRTH:  12/22/1960   DATE OF CONSULTATION:  05/12/2003  DATE OF DISCHARGE:                                   CONSULTATION   SURGICAL NOTE   Surgery was asked to assist the hospitalist in this critically ill patient  with a history of substance abuse and diabetic ketoacidosis all peripheral  sites were very tenuous and difficult.  Central line was asked for.   Following aseptic technique a triple lumen central venous line was placed  via the right subclavian vein using the Seldinger approach.  We had good  blood flow and clinically no pneumothorax.  The line went in fairly easily.   Chest x-ray has been called for line position and to rule out pneumothorax  prior to transfer.      ___________________________________________                                            Barbaraann Barthel, M.D.   WB/MEDQ  D:  05/12/2003  T:  05/12/2003  Job:  [id number]   cc:   Dr. Orvan Falconer

## 2010-12-20 NOTE — Discharge Summary (Signed)
NAMEJAMIRACLE, AVANTS NO.:  [id number]   MEDICAL RECORD NO.:  [id number]          PATIENT TYPE:  INP   LOCATION:  2006                         FACILITY:  [facility]   PHYSICIAN:  Pearlean Brownie, M.D.DATE OF BIRTH:  10-Feb-1961   DATE OF ADMISSION:  09/23/2005  DATE OF DISCHARGE:  09/25/2005                                 DISCHARGE SUMMARY   The patient is being discharged likely to behavioral health.   PROBLEM LIST:  1.  Overdose of clonidine/oxycodone/Mavik.  2.  Suicide attempt.  3.  Insulin dependent diabetes.  4.  Hypertension.  5.  Hepatitis C.  6.  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 The patient was at [facility]  7.  History of suicide attempt via an overdose 3 years ago; 5 years ago she      was taken to [facility] for suicidal ideation.  8.  Dilated cardiomyopathy.  9.  History of back pain.   CONSULTATIONS:  Psychiatry.   PROCEDURE:  Abdominal ultrasound.   LABORATORY DATA:  On the day of admission the patient's white blood cells  were 4.1, H&H 7.5/30.6, platelets 154. A CMP on the day of admission showed  sodium 137, potassium 4, chloride 110, bicarb 22, glucose 90, BUN 14,  creatinine 0.8, bilirubin 0.5, alkaline phos 41, AST 15, ALT 15, protein  4.6, albumin 2.9, calcium 7.3. Alcohol level was less than 5 but his  acetaminophen level was 56. HBA1C was 9.3, BNP on February 22 was 122.  Lipase level was 33.   HOSPITAL COURSE:  This is a 50 year old female with a history of suicide  attempts and drug use who comes after taking crack and attempting to kill  herself.   Problem 1: Overdose. The patient took approximately, per her mother and per  her, 20 pills of clonidine, 20 pills of oxycodone, 20 pills of Mavik. The  patient had a low blood pressure, initially 80s/40s, came up to 90s/50s with  fluids and within 24 hours was back to 120/80. The patient also had  initially low pulse in  the 40s to 50s but this was supported with fluids and  in less than 24 hours she was able to support a pulse rate of between 50 and  60 by herself. Prior to discharge her pulse was up to 67 and her blood  pressure was 142/86. Psychiatry was consulted and they recommended inpatient  stay for crisis. She will be likely going to behavioral health.  Problem 2: Drug use. The patient has a long history of drug use and she  refused urine drug screen. She did admit to taking crack the night before  she was admitted.  Problem 3: Insulin dependent diabetes. The patient was kept on her home dose  of Lantus and covered with sliding scale insulin. Her HBA1C when seen was  elevated as in laboratory data.  Problem 4: Hepatitis C. The patient's AST and ALT within normal limits.  Problem 5: Diabetic neuropathy. The patient was kept on her home dose  of  gabapentin.  Problem 6: Hypertension. Initially the patient's Mavik and clonidine were  held secondary to a low blood pressure. However these were restarted prior  to discharge.  Problem 7: History of back pain. Patient was given Toradol for this problem  and oxycodone when the pain became terrible.  Problem 8: Likely right heart failure. On patient's abdominal ultrasound,  found to have findings consistent with elevated right heart pressure. The  IVC and hepatic veins were prominent and there is a small amount of ascites.  The gallbladder wall was slightly thick, a common finding in this setting.  The patient is not tender over the gallbladder, and there is no sign of a  gallstone. We got a BNP which was slightly elevated. A TSH is pending at  this time and needs to be followed up if it is not done prior to discharge.  The patient's EKG shows sinus bradycardia on admission. The patient will get  an echo in hospital or one will be scheduled as an outpatient.   DISCHARGE MEDICATIONS:  1.  Lantus 30 units each morning.  2.  Effexor 75 mg p.o. daily.  3.   Seroquel 25 mg p.o. t.i.d. p.r.n.  4.  Clonidine 0.1 mg p.o. b.i.d.  5.  Mavik 1 mg p.o. daily.  6.  Regular insulin as previously directed by regular M.D.  7.  Protonix 40 mg daily.  8.  Amantadine 100 mg p.o. b.i.d.  9.  Gabapentin 300 mg take 2 pills 3 times a day.  10. Vistaril 25 mg daily.  11. Lasix 20 mg daily.  12. Aspirin 81 mg daily.  13. Oxycodone 5 mg every 4-6 hours as needed for pain.   The patient was given prescription for 7 days' worth of her home medications  that she had used for an overdose. We are not currently giving her more as  we are concerned about a re-overdose. The patient was a full code during her  hospital stay.      Rolm Gala, M.D.    ______________________________  Pearlean Brownie, M.D.    Bennetta Laos  D:  09/25/2005  T:  09/25/2005  Job:  [id number]

## 2010-12-20 NOTE — Discharge Summary (Signed)
NAMEMarland Kitchen  Monica [name], Monica [name] NO.:  [id number]   MEDICAL RECORD NO.:  [id number]          PATIENT TYPE:  IPS   LOCATION:  0305                          FACILITY:  [facility]   PHYSICIAN:  Jeanice Lim, M.D. DATE OF BIRTH:  [birth year]   DATE OF ADMISSION:  09/25/2005  DATE OF DISCHARGE:  10/02/2005                                 DISCHARGE SUMMARY   IDENTIFYING DATA:  50 year old single Caucasian female, involuntarily  admitted, readmission, discharged on February 9, had relapsed on cocaine  when she called her mom's house and told her she had to get out of the  house, and her 80-year-old lives with the parents.  She had taken an  overdose.  Endorsing  mood swings, anxiety, shame, guilt about addiction and  her sense of poor parenting, her self perception of having been a poor  parent.  Past psychiatric history:  [facility]  admission, 20 year history of drug use and frequent crack cocaine  dependence, distant history of IV drug abuse.  Was living with parents, with  child.  Medical problems:  Diabetes mellitus type II, hepatitis C and  cardiomyopathy.  See medication reconciliation form last admission, was on  Effexor.   ADMISSION MEDICATIONS:  Effexor.   ALLERGIES:  No known drug allergies reported at time of initial evaluation.   PHYSICAL AND NEUROLOGICAL EXAMINATION:  Within normal limits.  Review of  systems unremarkable.   MENTAL STATUS EXAM:  Fully alert, labile affect, tearful, anxious,  disheveled.  Speech normal tone and pace, decreased productivity, mood  irritable, thought process mostly goal directed, positive suicidal ideation,  cognitively intact.  Judgment and insight were impaired.   ADMISSION DIAGNOSES:  AXIS I:  Depressive disorder not otherwise specified,  rule out substance-induced mood disorder superimposed on bipolar II  disorder, cocaine dependence, and polysubstance abuse.  AXIS II:  Deferred.  AXIS III:  Diabetes mellitus  type II and cardiomyopathy.  AXIS IV:  Moderate stressors, with multiple psychosocial stressors.  AXIS V:  30/5.   HOSPITAL COURSE:  The patient was admitted and ordered routine p.r.n.  medications, underwent further monitoring, and was encouraged to participate  in individual, group and milieu therapy.  The patient was monitored for  safety, optimized on medications, reported a gradual improvement in mood  stability, feeling angry, agitated and edgy at times and as if she could be  aggressive, but did tolerate tapering down and discontinuation of Effexor  and stabilization of mood with Lamictal and Seroquel, and worked on  identifying a relapse prevention plan, identifying triggers, and developing  healthier coping skills.  The patient continued improvement and was  discharged without withdrawal symptoms.  Mood was more stable, affect  brighter, no dangerous ideation, reporting motivation to remain abstinent,  compliant with followup.  Follow up was with Teola Bradley, and to keep  appointment for echocardiogram.  The patient already had a prescription for  Percocet from internal medicine, Percocet 525, take as previously  prescribed.  Cautioned with tolerance and dependency issues.  Insulin  __________ 35 units subcutaneous at 7 a.m., Seroquel 100 mg 1.5 at  9 p.m.  Patient warned about metabolic issues including diabetes, triglycerides,  weight gain, EPS, TD, and others as related to other meds.  Medication  education reviewed at time of discharge, Doxepin 75 mg q.9 p.m., Lamictal 25  mg 1 until March 8, then 2 q.a.m., Lasix 20 mg q.a.m., Protonix 40 mg  q.a.m., Symmetrel 100 mg b.i.d., aspirin 81 mg q.a.m., Mavik 1 mg q.a.m.,  Neurontin 600 mg t.i.d., Clonidine 0.1 mg b.i.d., Vistaril 25 mg q.a.m.,  Effexor XR 37.5 mg q.a.m.   DISPOSITION:  The patient to follow up at [facility] with Viviann [facility]  on Friday, March 2 at 11 a.m.   DISCHARGE DIAGNOSES:  AXIS I:  Depressive disorder  not otherwise specified,  rule out substance-induced mood disorder superimposed on bipolar II  disorder, cocaine dependence, and polysubstance abuse.  AXIS II:  Deferred.  AXIS III:  Diabetes mellitus type II and cardiomyopathy.  AXIS IV:  Moderate stressors, with multiple psychosocial stressors.  AXIS V:  Global assessment of functioning on discharge was 55.      Jeanice Lim, M.D.  Electronically Signed     JEM/MEDQ  D:  10/19/2005  T:  10/20/2005  Job:  [id number]

## 2010-12-20 NOTE — Discharge Summary (Signed)
NAMEMIXTLI, RENO            ACCOUNT NO.:  [id number]   MEDICAL RECORD NO.:  [id number]          PATIENT TYPE:  IPS   LOCATION:  0506                          [facility]ILITY[facility]  [facility]   PHYSICIAN:  Geoffery Lyons, M.D.      DATE OF BIRTH:  [birth year]   DATE OF ADMISSION:  08/23/2007  DATE OF DISCHARGE:  08/24/2007                               DISCHARGE SUMMARY   TIME:  8:15 a.m.   IDENTIFYING INFORMATION:  A 50 year old white female who is single.  This is a voluntary admission.   HISTORY OF PRESENT ILLNESS:  This patient was admitted here at [facility] after  requesting opiate detox.  Reported that   Dictation Ends Here      Claris Che A. Scott, N.P.      Geoffery Lyons, M.D.  Electronically Signed    MAS/MEDQ  D:  08/25/2007  T:  08/25/2007  Job:  [id number]

## 2010-12-20 NOTE — Discharge Summary (Signed)
[facility] [facility]  Patient:    Monica [name], Monica [name]                   MRN: [id number] Adm. Date:  [id number] Disc. Date: [id number] Attending:  Tammi Sou Dictator:   Andrey Spearman, M.D.                           Discharge Summary  DISCHARGE DIAGNOSES:          1. Status post low transverse cesarean section                                  at 35-1/7 weeks for variable decelerations                                  with late return, elevated cord Dopplers, and                                  elevated middle cerebral artery flow.                               2. Insulin-dependent diabetes.                               3. Polyhydramnios.                               4. Edema.                               5. 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                               6. Nicotine addiction.                               7. Status post bilateral tubal ligation for                                  sterilization.  DISCHARGE MEDICATIONS:        1. Ibuprofen 600 mg q.6h. p.r.n. pain.                               2. Prenatal vitamins one p.o. q.d.                               3. Methadone as directed by the methadone                                  clinic.  PROCEDURES DURING THIS HOSPITALIZATION:              1. Ultrasound on April 30, 2000, which                                  showed a single fetus in breech presentation,                                  placenta posterior grade 2, increased                                  amniotic fluid with an AFI of 34.9, at the                                  greater than 97th percentile for gestational                                  age with enlarged abdominal circumference,                                  and cervical length 3.8 cm.                               2. Ultrasound on May 04, 2000, which showed                                  an AFI of 28.5, cervical length 4.7, and  no                                  signs of hydrops, however, there was                                  subcutaneous fat noted on this ultrasound.                               3. Ultrasound on May 11, 2000, which showed                                  an AFI of 39.4, biophysical profile 8/8,                                  cervical length 4.7 cm, SD ratio of 5.2, and                                  middle cerebral artery 3.12.                               4. Ultrasound on May 14, 2000, which showed  an AFI of 39.7, SD ratio of 10.3, MCA of                                  2.74, and cervical length 3.0.  BRIEF ADMISSION HISTORY AND PHYSICAL:                 This patient is a 50 year old, G7, now P2-1-4-3, admitted at 32 weeks by LMP and ultrasound at 5 weeks, who was referred from work today with nausea, vomiting, swelling in her legs, and back pain.  The patient was reported good fetal movement on admission and was also reporting contractions.  PHYSICAL EXAMINATION:         Blood pressure 126/70, pulse 89, fetal heart rate 140 with accelerations, and she was having uterine contractions every two to four minutes.  Her physical exam was otherwise within normal limits.  LABORATORY DATA:              Her urine showed greater than 80 ketones with a specific gravity of 1.035.  ADMISSION IMPRESSION:         The patient was admitted with ketonuria likely secondary to her nausea and vomiting, polyhydramnios, increased uterine activity from dehydration, and polyhydramnios without any cervical change. She was admitted for IV rehydration.  HOSPITAL COURSE: #1 - INSULIN-DEPENDENT DIABETES MELLITUS:  The patient was admitted.  She was started on scheduled NPH Insulin, as well as sliding scale Regular Insulin. This was followed both by staff M.D.s, as well as the diabetes coordinator, Lenor Coffin, throughout her hospitalization.  The patients sugars,  however, remained in the 190s-200s and the patient refused to take the insulin as prescribed by her physicians.  The patient was explained on multiple occasions that increased sugar could be harmful to both herself and her baby.  The patient verbalized understanding of this, but repeatedly refused to take the insulin prescribed to her.  After the delivery of her child, the patients sugars were substantially lower, consistent with a probable honeymoon period after delivery.  On her day of discharge, the fasting sugar was 77.  The highest sugar after delivery was 252.  The patient was previously followed up at [facility] for her diabetes.  An appointment was made for her to follow up with [facility] for this medical problem.  She is not going to be discharged home with any insulin, but will likely need insulin towards the end of this week.  #2 - HISTORY OF DRUG ABUSE ON METHADONE:  The patient was continued on methadone as prescribed by the methadone clinic throughout the hospitalization.  She went through two urine drug screens, which were positive for opiates, consistent with her methadone usage.  She will follow up with the methadone clinic as an outpatient.  #3 - STATUS POST LOW TRANSVERSE CESAREAN SECTION:  Delivery of a viable female at 35-1/7 weeks for variable decelerations, elevated cord Dopplers, and abnormal middle cerebral artery flow.  The patients pregnancy was followed closely throughout the hospitalization.  The patient continued to have contractions shown on the tocometer throughout her hospitalization.  However, she had no cervical changes and the contractions were thought to be secondary to her large amount of polyhydramnios as evidenced by ultrasound results as stated above.  The patients baby was monitored twice a day throughout most of her hospitalization at intervals throughout the hospitalization.  Her child would have occasional variable decelerations, however,  fetal heart tones  would then be reactive after these decelerations had resolved.  On May 11, 2000, the patient had persistent variable decelerations and so was transferred to  antenatal for continued monitor.  The patient, however, was very noncompliant with continuous monitoring and repeatedly signed out against medical advice to smoke cigarettes outside.  Despite every effort to monitor the baby as continuously as possible, the patient was generally on the monitor only for a few hours every day due to her noncompliance.  It was again explained to the patient the importance of monitoring her baby closely and the tenuous nature of her pregnancy.  However, the patient refused to be on the monitor.  On May 11, 2000, the patient had an ultrasound to evaluate cord Dopplers and they were found to be elevated as stated below.  Therefore, cord Dopplers were repeated on May 15, 2000.  They were found to be elevated even further with no end-diastolic flow and the middle cerebral artery flow was abnormal as well.  This in addition to variable decelerations with late return prompted the patient to go for emergency C-section at 35-1/7 weeks.  The patient had LPCS and produced a viable female with Apgars of 6, 6, and 8.  The infant was taken to the neonatal intensive care unit and was quickly transferred to ______ for persistent pulmonary hypertension as the child apparently had an enlarged septum.  Upon the patients discharge, the baby was stable, but in critical condition at ______ Hospital.  The patients postpartum care was relatively uneventful.  The patient will return to have her staples removed. She had a bilateral tubal ligation along with her C-section and will not need any birth control.  The patient reported that her pain and bleeding were minimal and she was planning to bottle feed.  #4 - EDEMA:  The patient had 2-3+ lower extremity edema throughout her hospitalization.  This was  thought to be secondary to poor glycemic control, as well as poor nutrition prior to her admission.  The patients edema upon discharge was still 2-3+.  It was explained to the patient that this will take time to resolve.  #5 - POOR SOCIAL SITUATION:  Social work worked with the patient throughout the hospitalization.  She will be going home and be following up in the methadone clinic as she did before her pregnancy and during her pregnancy.  It is unclear as to the status of whether she will get custody of the baby, but this will be a social work issue at ______ [facility] where the baby is now.  #6 - NICOTINE ADDICTION:  The patient smoked very excessively during the hospitalization.  Despite our best efforts to explain to her that this was a danger to both herself and fetus, she persistently signed out against medical advice to go have cigarettes.  This often has prevented her from being monitored during critical times throughout this pregnancy.  DISPOSITION:                  The patient is discharged in stable condition. She is following up with [facility] for her diabetes on May 21, 2000, and will be following up at [facility] for her postpartum evaluation. DD:  05/18/00 TD:  05/18/00 Job: [id number]

## 2010-12-20 NOTE — Discharge Summary (Signed)
[name], [name]            ACCOUNT NO.:  [id number]   MEDICAL RECORD NO.:  [id number]          PATIENT TYPE:  INP   LOCATION:  2006                         FACILITY:  [facility]   PHYSICIAN:  Pearlean Brownie, M.D.DATE OF BIRTH:  1960/10/14   DATE OF ADMISSION:  09/23/2005  DATE OF DISCHARGE:  09/25/2005                                 DISCHARGE SUMMARY   ADDENDUM TO DISCHARGE SUMMARY   Patient is indeed leaving on September 25, 2005.  She will be going to  [facility].  I have arranged for an outpatient 2-D echo to be done on  October 08, 2005 at 9 p.m.   DISCHARGE MEDICATIONS:  As previously dictated.   For the primary M.D., he will need to follow up on this echo and the pending  TSH.      Monica [name], M.D.    ______________________________  Pearlean Brownie, M.D.    Monica [name]  D:  09/25/2005  T:  09/25/2005  Job:  [id number]

## 2010-12-20 NOTE — H&P (Signed)
NAME:  Monica [name], Monica [name]                      ACCOUNT NO.:  [id number]   MEDICAL RECORD NO.:  [id number]                   PATIENT TYPE:  EMS   LOCATION:  ED                                   FACILITY:  [facility]   PHYSICIAN:  Vania Rea, M.D.              DATE OF BIRTH:  Mar 08, 1961   DATE OF ADMISSION:  05/12/2003  DATE OF DISCHARGE:                                HISTORY & PHYSICAL   Brought in unresponsive by boyfriend. History is from boyfriend and his  former girlfriend.   HISTORY OF PRESENT ILLNESS:  This 50 year old Caucasian female with a known  history of IVDA and diabetes type 1 brought to the ER by emergency medical  services at the request of her boyfriend who allegedly found her at her home  unresponsive. He had not seen her for the past nearly 24 hours. The place  was disheveled, and she seemed to have been suffering from a drug overdose.  They packed her in ice in cold shower thinking that would help. She was  barely responsive when she came to the emergency room. She was given Narcan  and became more arousable but confused. Her initial vital signs when seen  showed a temperature of 93.1 rectally, a blood pressure of 90/52, and  respiratory rate of 60. She was intubated.   Her past history from her old chart is significant for IVDA, diabetes type  1, history of hepatitis C, history of herpes simplex virus.   MEDICATIONS:  Unknown.   ALLERGIES:  NEFAZODONE and WELLBUTRIN.   SOCIAL HISTORY:  Significant tobacco, alcohol, and drug use.   FAMILY HISTORY:  At least two children.   REVIEW OF SYSTEMS:  Unable to obtain.   PHYSICAL EXAMINATION:  GENERAL:  A bedraggled young lady intubated and  paralyzed on the vent, receiving IV fluids and a rectal probe in situ. Pulse  is 111, blood pressure 115/44, temperature 95.5 per rectum. Vent settings:  Tidal volume 500 x 20 with a PEEP of 5, FiO2 1% saturating 100.  HEENT:  She is pink, warm, dehydrated, anicteric. Pupils  are 2 mm, equal,  and reactive to light.  CHEST:  Clear to auscultation bilaterally.  CARDIOVASCULAR:  She is tachycardia, regular, no murmurs.  ABDOMEN:  Scaphoid. No organomegaly.  EXTREMITIES:  Multiple needle marks on upper and lower extremities. No  edema. Pulses 2+.  CENTRAL NERVOUS SYSTEM:  She is paralyzed, unable to assess.   LABORATORY DATA:  Initial ABG __________ pH was 6.76, CO2 18.4, pO2 316 on  100% nonrebreather mask. Twenty minutes later, pH 6.75, pCO2 45, and pO2 462  on a vent setting of 500 x 14 with a PEEP of 5, FiO2 of 100. Initial CBC  15.3/46.4, white count 39.1, platelets 452. Her sodium was 166, potassium  7.5, chloride 100, bicarb 9, BUN 45, creatinine 2.6, glucose 744, calcium  8.7. The straight anion gap is 26, adjusted anion gap  was about 36.  Associated with this, the total bilirubin was 1, total protein 7, albumin  3.7, alkaline phosphatase 139, AST 87, ALT 129. After 2 to 3 liters of  fluid, sodium was 142, potassium 5.8, chloride 116, bicarb 10, BUN 44,  creatinine 2.5, glucose 589, calcium 7.5. Her PT was 17.3 and INR 1.7, PTT  27. Urine tox positive for cocaine, opiates, and benzodiazepines.   ASSESSMENT:  1. Diabetic ketoacidosis, probably secondary to cocaine abuse and failure to     take insulin.  The plan is to hydrate her vigorously and keep her on insulin drip.  1. Severe metabolic acidosis. Will give her intravenous bicarb as necessary     to keep the pH greater than 7.  2. Polysubstance abuse. Will monitor her vital signs and keep her on the     vent until she seems able to breath on her own.  3. Severe metabolic derangements. Monitor her biochemistry every four hours.     Monitor finger sticks every hour, paying particular attention to the     potassium and magnesium.  4. Deranged liver function tests probably due to hepatitis C.  5. Renal insufficiency, possibly due to dehydration, possibly due to     diabetic nephropathy.  6.  Hyperthermia induced by relatives in attempt to revive her. Continue     warming IV fluids until core temperature reaches 97.                                                Vania Rea, M.D.    LC/MEDQ  D:  05/12/2003  T:  05/12/2003  Job:  [id number]

## 2010-12-20 NOTE — H&P (Signed)
NAMEMODEAN, MCCULLUM            ACCOUNT NO.:  [id number]   MEDICAL RECORD NO.:  [id number]          PATIENT TYPE:  INP   LOCATION:  1831                         FACILITY:  [facility]   PHYSICIAN:  Rolm Gala, M.D.    DATE OF BIRTH:  [birth year]   DATE OF ADMISSION:  09/23/2005  DATE OF DISCHARGE:                                HISTORY & PHYSICAL   PRIMARY CARE PHYSICIAN:  [facility]   CHIEF COMPLAINT:  Overdose of Klonopin/Oxycodone/Mavik, Regular Insulin.   HISTORY OF PRESENT ILLNESS:  The patient is a 50 year old female with a  history of multiple suicide attempts and polysubstance abuse, who last night  met with her friends who are all drug addicts, smoked some crack and did not  come home per Mom. The patient called Mom this morning at 8:30 a.m. and said  her druggie friends were going to kill her, they are going to stab me and  go. Mom told her that she is through with her; Dad went to pick her up. Dad  took her to Mom's house and dropped her off where patient said, nobody  wants me, locked herself in her room, and took multiple medications. The  story has gotten mixed up, patient and mother. I received permission from  patient to talk with this woman. The patient also wrote a suicide not to her  daughters. Per EMS, patient had taken Klonopin and Hydrocodone. Per the  patient she also took [facility] approximately 20 pills, and 20 units of Regular  Insulin. The patient intended to kill herself.   The patient was recently released from [facility], an 8-day stay. She  came out a week ago and was working with Dr. Laurence Compton there.   PAST MEDICAL HISTORY:  1.  Insulin-dependent diabetes.  2.  Hypertension.  3.  Hepatitis C.  4.  A 20-year history of drug abuse with a history of an overdose that led      to a coma for 1 month. Patient was at [facility].  5.  History of suicide attempt, and she had an overdose 3 years ago, and 5      years ago she was taken to [place] for  suicidal ideation.  6.  Diabetic neuropathy.  7.  History of back pain with questionable gallbladder disease. She was      supposed to have an ultrasound this morning.   MEDICATIONS:  1.  Klonopin, unknown dose.  2.  Effexor, unknown dose.  3.  Gabapentin, unknown dose.  4.  Regular Insulin, unknown dose.  5.  Mavik, unknown dose.  6.  Seroquel, unknown dose.  7.  Hydrocodone, unknown dose.  8.  Patient just had DETOX from methadone 3 weeks ago.  9.  This is a partial list of patient's medications. Mother will call later      with the full list.   SOCIAL HISTORY:  The patient lives with Mom. Has a 58-year-old, 103 year old  and 62 year old children. The patient smokes tobacco less than a pack per  day. No history of alcohol. Positive history of crack and a history of  heroin abuse.  FAMILY HISTORY:  Mom and Dad are healthy as are the children. There is no  psychiatric history in the family.   PHYSICAL EXAMINATION:  VITAL SIGNS:  Temperature 97.1, pulse 64, went down  to 50, respiration 16, came up to 19, blood pressure 75/37, came up to  84/47.  GENERAL:  The patient is sleepy but arousable, angry.  HEENT:  Pupils are dilated at 4 to 5 mm. Patient refused to open mouth.  HEART:  Bradycardic but regular, no JVD.  LUNGS:  Clear to auscultation bilaterally.  ABDOMEN:  Soft with some mid epigastric pain x4 days. The patient was tender  to palpation in the mid epigastrium.  EXTREMITIES:  No clubbing, cyanosis or edema. Pedal pulses 2+.   LABORATORY DATA:  White blood cell count 4100, H&H 10.5/30.6, platelets  154,000,ANC 2.1. Sodium 137, potassium 4, chloride 110, bicarb 22, BUN 14,  creatinine 0.8, glucose 90, calcium 7.3, protein 4.6, albumin 2.9, AST 16.  ALT 15, alkaline phosphatase 41, bilirubin 0.5. Alcohol less than 5. Urine  drug screen is pending.  An EKG shows sinus bradycardia.   ASSESSMENT AND PLAN:  This is a 50 year old with:  1.  Overdose with suicidal ideation;  patient took at least Klonopin, Insulin      20 units, Mavik 20 pills, Hydrocodone. Mom will call later with a full      list of medications and what she thinks she might have taken. I called      Poison Control and will give the patient supportive care and follow her      blood pressures per their recommendations. We are checking a Tylenol      level at 4 hours outside the overdose. The patient's Insulin should be      out of her system but we will give her D5 half-normal saline as needed,      IV fluid and keep her n.p.o. We will be checking CBGs q. 2 hours. I will      follow her urine output and creatinine and EKG. The patient will be on      suicidal precautions and will get a psych consult.   1.  Diabetes. After 20 units of Regular Insulin we will hold sliding scale      for now and give patient D5 half-normal saline at maintenance.      Rolm Gala, M.D.     Bennetta Laos  D:  09/23/2005  T:  09/23/2005  Job:  [id number]

## 2010-12-20 NOTE — Discharge Summary (Signed)
Monica [name], Monica [name]            ACCOUNT NO.:  [id number]   MEDICAL RECORD NO.:  [id number]          PATIENT TYPE:  IPS   LOCATION:  0506                          FACILITY:  [facility]   PHYSICIAN:  Geoffery Lyons, M.D.      DATE OF BIRTH:  01-Nov-1960   DATE OF ADMISSION:  08/23/2007  DATE OF DISCHARGE:  08/24/2007                               DISCHARGE SUMMARY   IDENTIFYING INFORMATION:  A 50 year old white female, divorced this is a  voluntary admission.   HISTORY OF PRESENT ILLNESS:  This patient presented as a walk-in to our  hospital requesting help with opiate addiction.  She reports she has a  history of addiction to opiates, at one-time was on methadone for about  10 years, and then stopped cold [place] 4 years ago.  Now she has  relapsed on opiates about 2 months ago due to dental problems and the  chronic dental pain that she was having.  She started back on pain pills  and her use escalated to the use of heroin and she now has been using  anywhere between three and four bags of heroin daily for about 4-6  weeks.  She did uses two bags of heroin yesterday felt that she wanted  to use more felt that her addiction was out of control and was  requesting detox.  She has denied suicidal thoughts, had expressed a  desire to continue on her other medications feeling that she does not  need to detox from the oxycodone that she is currently taking.  Or  hydrocodone even though she is endorsed that she was using upper  prescriptions early.   PAST PSYCHIATRIC HISTORY:  The patient has been followed by Dr. Betti Cruz at  [facility] where she is prescribed Klonopin 1 mg p.o. t.i.d. and had  run out of her Klonopin 3 days early.  She has been followed by Dr.  Betti Cruz for several years.  Her last admission to [facility] was of February 22 to October 02, 2005, at the service of Dr.  Kathrynn Running for treatment of cocaine abuse independence, depressive disorder  NOS and polysubstance  abuse.  At that time, she was a scheduled follow-  up the [facility] with Viviann [facility].  Single white female, does have  parents in the area and has two children on disability.   FAMILY HISTORY:  Not known.   MEDICAL HISTORY:  1. History of diabetes mellitus type 2, currently on insulin.  2. Neuropathic pain in her leg.  3. History of bilateral pneumothorax in the past.  4. C-section x2.   CURRENT MEDICATIONS:  1. Neurontin 600 mg p.o. t.i.d.  2. Furosemide 40 mg p.o. b.i.d.  3. Hydrocodone APAP 5/500 1-2 tablets q.4-6 h p.r.n.  4. Tramadol 50 mg 1-2 tablets q.8 h p.r.n. pain.  5. Klonopin 1 mg p.o. t.i.d.  6. Cyclobenzaprine 10 mg p.o. t.i.d.  7. Oxycodone APAP 10/325 p.o. t.i.d.  8. Lisinopril 10 mg daily.  9. Prevacid 20 mg daily.  10.Lantus insulin 30 units q.h.s.  11.The patient's pattern for taking the opiates for  pain management is      not clear.   DRUG ALLERGIES:  NAPROXEN WHICH CAUSES A RASH.   POSITIVE PHYSICAL FINDINGS:  GENERAL:  She is a medium built female who  is in no acute distress.  She has refused to cooperate with a full  physical or review of systems on admission to our unit  VITAL SIGNS:  Height 5 feet, 5-1/2 inches tall, 174 pounds, temperature  97.7, pulse 86, respirations 20, blood pressure 107/62.  She is fully  alert and oriented x4.  Motor exam appears within normal limits.  No  unusual movements.  No signs of withdrawal at this time.   DIAGNOSTIC STUDIES:  Currently pending.   MENTAL STATUS EXAM:  Fully alert female, coherent in no physical  distress.  Mood is a little bit irritable.  She is quite clear that she  does not want to do detox here unless she can be guaranteed that she  could have other medications that she requests.  She admits that she has  been using her Klonopin early.  Not as prescribed, and in fact, had sold  some of those medications to someone else.  If she cannot choose what  she wants to be detoxed from, she is not  interested in continuing with  treatment.  She reports that she is not suicidal and is not interested  in having [place] possibly arrange for any other alternative treatment for  her.  We were wanting to offer other alternatives, possibly a methadone  clinic, but she refused to listen to the information.  Again, she has  repeated that she is not suicidal.   AXIS I:  Opiate abuse and dependence, benzodiazepine abuse.  AXIS II:  No diagnosis.  AXIS III:  Diabetes mellitus type 2.  AXIS IV: Deferred.  AXIS V: Current 55, past year not known.   PLAN:  Discharge the patient today per her request as she plans on  following up with Dr. Betti Cruz for her needs, but refused to allow [place] to  contact him to coordinate her care, has refused additional information  for other treatment for opiate abuse.      Margaret A. Scott, N.P.      Geoffery Lyons, M.D.  Electronically Signed    MAS/MEDQ  D:  08/25/2007  T:  08/25/2007  Job:  [id number]

## 2010-12-20 NOTE — H&P (Signed)
[place]  Patient:    Monica [name], Monica [name]                   MRN: [id number] Adm. Date:  [id number] Attending:  Edwyna Perfect Dictator:   Cheree Ditto, M.D. CC:         [facility]                         History and Physical  CHIEF COMPLAINT:  Heroin addiction.  HISTORY OF PRESENT ILLNESS: A 50 year old female G7 P2-0-4-2 at 24 and one-sevenths weeks by ultrasound at 10 weeks presents with heroin addiction. She has diabetes mellitus type 1.  She is currently in the methadone program at [facility] but still using heroin.  She has a desire to quit and enter [facility], an inpatient treatment facility in [place].  She needs to be off heroin before getting to that program.  Currently, patient feels well. Yesterday, she had subjective fevers and a headache, but this has resolved.  PAST MEDICAL HISTORY:  1. Type 1 diabetes.  2. History of gestational diabetes.  3. History of hepatitis C.  4. History of HSV.  5. History of anxiety and depression.  6. Polysubstance abuse.  SURGICAL HISTORY:  C section.  OBSTETRICAL HISTORY:  Status post therapeutic abortion x 4.  With this pregnancy, she is hepatitis B negative, rubella equivocal, blood type A positive, antibody negative, HIV nonreactive.  FAMILY HISTORY:  Grandmother and aunt with hypothyroidism.  SOCIAL HISTORY:  Patient is divorced.  She has two children who live with her ex-husband.  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  She smokes one pack per day, no alcohol use.  She uses IV heroin currently and has a history of crack use.  REVIEW OF SYSTEMS:  No dysuria, no dyspnea, no vaginal bleeding, no cramping. Positive right lower quadrant discomfort, constant for greater than a month. No vaginal discharge, no symptoms of spontaneous rupture of membranes.  PHYSICAL EXAMINATION:  VITAL SIGNS:  Blood pressure 102/60, heart rate 90, respiratory rate 20, fetal heart tones in the 140s,  temperature 98.0.  CBG is 239.  GENERAL:  She is pleasant and in no distress.  HEENT:  Pupils equal, round, and reactive to light and accommodation. Extraocular movements are intact.  Oropharynx is clear.  NECK:  Supple without lymphadenopathy.  CARDIOVASCULAR:  Regular rate and rhythm with no murmur, rub, or gallop.  CHEST:  Clear to auscultation bilaterally.  ABDOMEN:  Soft, gravid, positive bowel sounds, nontender.  EXTREMITIES:  No edema.  ASSESSMENT AND PLAN:  A 50 year old G2 P2-0-4-2 at 22 and one with diabetes mellitus type 1 presents with heroin addiction.  Will admit and provide supportive care through any withdrawals that may occur.  Continue current dose of methadone.  Confirm dose with [facility] in the morning.  Phenergan for nausea. Encourage smoking cessation.  Will allow patient limited trips outside to smoke.  Monitor fetal Dopplers.  Will continue patients home regimen of NPH and check sugars, and provide sliding scale insulin as recommended by Dr. Tamela Oddi in her note from Wednesday, Dec 25, 1999.  Will check fasting, two-hour post prandial, and 3 a.m. glucose.  Will shoot for a goal of 60-90 fasting and 120-140 two-hour post prandial, but do our best to avoid hypoglycemia. DD:  12/27/99 TD:  12/28/99 Job: [id number]

## 2010-12-20 NOTE — Discharge Summary (Signed)
Monica [name], Monica [name] NO.:  [id number]   MEDICAL RECORD NO.:  [id number]          PATIENT TYPE:  IPS   LOCATION:  0503                          FACILITY:  [facility]   PHYSICIAN:  Anselm Jungling, MD  DATE OF BIRTH:  April 06, 1961   DATE OF ADMISSION:  09/03/2005  DATE OF DISCHARGE:  09/12/2005                                 DISCHARGE SUMMARY   IDENTIFYING DATA AND REASON FOR ADMISSION:  This was the first [facility] admission  for Monica [name], a 50 year old divorced white female admitted for treatment of  polysubstance abuse and dependence. At the time of admission, she had been  on a methadone program, and had simultaneously been abusing crack cocaine  and heroin. She had not been abusing alcohol. She came to [place] with a long  history of polysubstance abuse and dependence. She had had chemical  dependency treatment in the past, but had relapsed since then. She also came  to [place] with hepatitis C and insulin dependent diabetes mellitus. Her  admission was triggered in part because she became acutely depressed and  suicidal. Please refer to the admission note for further details pertaining  to the symptoms, circumstances and history that led to her hospitalization.  She was given an initial Axis I diagnosis of polysubstance abuse and  dependence, mood disorder NOS, and rule out substance-induced mood disorder.   MEDICAL AND LABORATORY:  The patient was medically and physically evaluated  by the psychiatric nurse practitioner upon admission. The patient was  continued on an insulin regimen in part determined by a sliding scale. This  was monitored and overseen by the nurse practitioner and the pharmacist. The  patient was also continued on Lasix 20 mg daily, Mavik 1 mg daily, Lipitor  10 mg daily for preexisting medical conditions. Admission laboratory showed  a normal urinalysis, and a normal metabolic panel with the exception of  increased glucose of 320, consistent with her diabetes.  CBC was within  normal limits. Hemoglobin A1c was 8.9. TSH was within normal limits.   HOSPITAL COURSE:  The patient was admitted to the adult inpatient  psychiatric unit. She was placed on withdrawal protocols involving  utilization of clonidine. In addition, she was started on Symmetrel 100 mg  b.i.d. for cocaine craving. She was given Effexor XR 75 mg daily, and  Seroquel 125 mg q.h.s.   She was also placed on a low-dose Librium withdrawal protocol to assist with  opiate withdrawal.   The patient presented as a bright, intelligent, energetic woman with fairly  good insight into her addiction issues. She was initially somewhat  ambivalent about staying in the program, expressing doubt and pessimism  about her ability to remain clean and sober anyway. However, her resolve to  continue in the detoxification process solidified throughout her stay, and  she ended up remaining for a total of 10 days of inpatient treatment. She  was a good participant in the treatment program, attending various  therapeutic groups, activities and classes, including those geared towards  individuals with chemical dependency issues. She experienced a significant  degree of anxiety and agitation day to  day, and at times, Seroquel 25 mg  p.r.n. was very helpful for this.   The patient has a history of peripheral neuropathy, and had significant  extremity pain and discomfort due to this. This was addressed with  increasing doses of Neurontin, as well as Flexeril, and Hydrocodone, APAP on  a p.r.n. basis. The patient engaged in a certain amount of medication  seeking behavior, but generally responded well to limit setting around this.   As her discharge time approached, there was extensive discussion about her  aftercare treatment needs. She expressed a strong interest in being able to  go to a residential chemical dependency rehab program and was willing to go  for as long as 90 days. However, at the time of  discharge we had not been  able to identify a program that she had adequate funding for.   On the day prior to discharge, there was a family session involving the  patient and her mother, which was successful in terms of discussing the  patient's overall treatment needs, and the mother setting appropriate limits  regarding her acceptance of the patient living at her home depending on  whether or not she remained clean and sober. They also discussed care of the  patient's 5-year-old daughter.   The patient was discharged the following day, the 10th hospital day. At that  time she was somewhat apprehensive about her ability to remain clean and  sober, but remained committed to the idea of doing so, and indicated that  she would begin attending Alcoholics Anonymous and Narcotics Anonymous  meetings immediately and regularly. She was absent any significant  depression or suicidal ideation.   AFTERCARE:  The patient was to follow-up with Coralee North on September 15, 2005 at 9:00 a.m., and Donnie Aho on September 29, 2005 and 9:30 a.m., for  continued therapeutic support. She was instructed to see her regular medical  doctor regarding pain management issues and diabetes management.   DISCHARGE DIAGNOSES:  AXIS I: Polysubstance abuse/dependence, early  remission, and depressive disorder not otherwise specified.  AXIS II: Deferred.  AXIS III: History of hypertension, diabetes mellitus, hyperlipidemia,  hepatitis C.  AXIS IV: Stressors severe.  AXIS V: Global assessment of functioning on discharge 65.   DISCHARGE MEDICATIONS:  1.  Symmetrel 100 mg b.i.d.  2.  Lasix 40 mg daily.  3.  Seroquel 25 mg t.i.d. and 125 mg q.h.s.  4.  Mavik 1 mg daily.  5.  Lipitor 10 mg daily.  6.  Effexor XR 75 mg daily.  7.  Tetracycline 250 mg b.i.d.  8.  Neurontin 600 mg t.i.d.  9.  Flexeril 10 mg up to t.i.d. p.r.n. 10. Insulin regimen as directed by her private diabetic doctor.            ______________________________  Anselm Jungling, MD  Electronically Signed     SPB/MEDQ  D:  09/12/2005  T:  09/12/2005  Job:  [id number]

## 2010-12-20 NOTE — Op Note (Signed)
[facility]  Patient:    Monica [name], Monica [name]                   MRN: [id number] Proc. Date: 05/15/00 Adm. Date:  [id number] Disc. Date: [id number] Attending:  Tammi Sou CC:         Jamey Reas, M.D.   Operative Report  PREOPERATIVE DIAGNOSES:       1. [redacted] weeks gestation.                               2. Polyhydramnios.                               3. Substance abuse on methadone.                               4. Desires sterilization.                               5. Fetal heart rate decelerations.                               6. No end-diastolic flow on umbilical artery                                  Dopplers.                               7. Previous cesarean section.  POSTOPERATIVE DIAGNOSES:      1. [redacted] weeks gestation.                               2. Polyhydramnios.                               3. Substance abuse on methadone.                               4. Desires sterilization.                               5. Fetal heart rate decelerations.                               6. No end-diastolic flow on umbilical artery                                  Dopplers.                               7. Previous cesarean section.  OPERATIONS:                   1. Repeat low transverse cesarean section.  2. Bilateral partial salpingectomy ([place]oy                                  technique).  SURGEON:                      Charles A. Clearance Coots, M.D.  ASSISTANT:                    Jamey Reas, M.D.  ANESTHESIA:                   Spinal.  ESTIMATED BLOOD LOSS:         800 ml.  INTRAVENOUS FLUIDS:           3100 ml.  URINE OUTPUT:                 250 ml, clear.  COMPLICATIONS:                None.  DRAINS:                       Foley to gravity.  FINDINGS:                     A viable female at 1623 hours, Apgars of 6 at one minute, 6 at five minutes, and 8 at 10 minutes, and weight of  3505 g. Normal uterus, ovaries, and fallopian tubes.  SPECIMENS:                    Approximately 2 cm segments of right and left fallopian tubes.  DESCRIPTION OF PROCEDURE:     The patient was brought to the operating room and after satisfactory spinal anesthesia, the abdomen was prepped and draped in the usual sterile fashion.  A Pfannenstiel skin incision was made through the scar down to the fascia with a scalpel.  The fascia was nicked in the midline.  The fascial incision was extended to the left and to the right with curved Mayo scissors.  The superior and inferior edges of the fascia were taken off with the rectus muscle with both blunt and sharp dissection.  The rectus muscle was bluntly and sharply divided in the midline.  The peritoneum was entered digitally and was digitally extended to the left and to the right. A bladder blade was positioned.  The vesicouterine fold of peritoneum above the reflection of the urinary bladder was grasped with forceps and was incised and undermined with Metzenbaum scissors.  The incision was extended to the left and to the right with the Metzenbaum scissors.  The bladder flap was bluntly developed and the bladder blade was repositioned and found the urinary bladder, placing it well out of the operative field.  The uterus was entered transversely with the scalpel in the lower uterine segment down to the amniotic sac.  The uterine incision was extended to the left and to the right in a smile configuration with the bandage scissors.  The amniotic sac was ruptured and a moderate amount of amniotic fluid was expelled, which was clear.  The vertex was then delivered with the aid of fundal pressure from the assistant.  The infants mouth and nose were suctioned with the suction bulb and the delivery was completed with the aid of fundal pressure from the assistant.  The umbilical cord was doubly clamped and cut and  the infant was handed off to the  nursery staff.  Cord pH and cord blood were obtained.  The placenta was then spontaneously expelled from the uterine cavity, intact.  The uterus was exteriorized and the endometrial surface was thoroughly debrided with a dry lap sponge.  The cervix was dilated with the [facility] forceps.  The edges of the uterine incision were grasped with ring forceps.  The uterus was closed with continuous interlocking suture of 0 Monocryl from each corner to the center.  Hemostasis was excellent.  Attention was then turned above to the tubal ligation procedure.  The right fallopian tube was grasped in the isthmic area of the tube with the Babcock clamp after identifying the tube from the cornual end of the tube to the fimbrial end of the tube.  The knuckle of tube beneath the Babcock clamp and the isthmic area of the tube was then doubly ligated with 1-0 plain catgut.  The section of tube above the knot was excised with Metzenbaum scissors and submitted to pathology for evaluation.  There was no active bleeding from the tubal stumps.  The same procedure was performed on the opposite site without complications.  The uterus was then placed back in its normal anatomic position and the pelvic cavity was thoroughly irrigated with warm saline solution and all clots were removed.  The closure of the uterus was again observed for hemostasis and there was no active bleeding. The tubal stumps were observed for hemostasis and there was no active bleeding noted.  The abdomen was then closed as follows:  The rectus muscles were approximated with a few interrupted sutures of 2-0 Monocryl.  The fascia was closed with continuous suture of 0 PDS from each corner to the center.  The subcutaneous tissues were thoroughly irrigated with warm saline solution.  All areas of subcutaneous bleeding were coagulated with the Bovie.  The skin was then approximated with surgical stainless steel staples.  A sterile bandage was applied to  the incision closure.  The surgical technician indicated that all sponge, needle, and instrument counts were correct.  The patient tolerated  the procedure well and was transported to the recovery room in satisfactory condition. DD:  05/15/00 TD:  05/17/00 Job: [id number]

## 2011-02-13 ENCOUNTER — Emergency Department (HOSPITAL_COMMUNITY)
Admission: EM | Admit: 2011-02-13 | Discharge: 2011-02-13 | Disposition: A | Discharge status: Other Acute Inpatient Hospital | Payer: Medicaid Other | Source: Home / Self Care | Attending: Emergency Medicine | Admitting: Emergency Medicine

## 2011-02-13 ENCOUNTER — Inpatient Hospital Stay (HOSPITAL_COMMUNITY)
Admission: RE | Admit: 2011-02-13 | Discharge: 2011-02-18 | DRG: 885 | Disposition: A | Discharge status: Home / Self Care | Payer: Medicaid Other | Attending: Psychiatry | Admitting: Psychiatry

## 2011-02-13 ENCOUNTER — Emergency Department (HOSPITAL_COMMUNITY): Admission: EM | Admit: 2011-02-13 | Payer: Medicaid Other | Source: Home / Self Care

## 2011-02-13 ENCOUNTER — Ambulatory Visit (HOSPITAL_COMMUNITY)
Admission: EM | Admit: 2011-02-13 | Discharge: 2011-02-13 | Disposition: A | Discharge status: Home / Self Care | Payer: Medicaid Other | Source: Ambulatory Visit | Attending: Emergency Medicine | Admitting: Emergency Medicine

## 2011-02-13 DIAGNOSIS — F329 Major depressive disorder, single episode, unspecified: Secondary | ICD-10-CM | POA: Insufficient documentation

## 2011-02-13 DIAGNOSIS — F17200 Nicotine dependence, unspecified, uncomplicated: Secondary | ICD-10-CM | POA: Insufficient documentation

## 2011-02-13 DIAGNOSIS — R45851 Suicidal ideations: Secondary | ICD-10-CM

## 2011-02-13 DIAGNOSIS — Z79899 Other long term (current) drug therapy: Secondary | ICD-10-CM | POA: Insufficient documentation

## 2011-02-13 DIAGNOSIS — F1910 Other psychoactive substance abuse, uncomplicated: Secondary | ICD-10-CM

## 2011-02-13 DIAGNOSIS — Z794 Long term (current) use of insulin: Secondary | ICD-10-CM | POA: Insufficient documentation

## 2011-02-13 DIAGNOSIS — F3130 Bipolar disorder, current episode depressed, mild or moderate severity, unspecified: Principal | ICD-10-CM

## 2011-02-13 DIAGNOSIS — M79609 Pain in unspecified limb: Secondary | ICD-10-CM | POA: Insufficient documentation

## 2011-02-13 DIAGNOSIS — E119 Type 2 diabetes mellitus without complications: Secondary | ICD-10-CM

## 2011-02-13 DIAGNOSIS — IMO0002 Reserved for concepts with insufficient information to code with codable children: Secondary | ICD-10-CM | POA: Insufficient documentation

## 2011-02-13 DIAGNOSIS — Z8619 Personal history of other infectious and parasitic diseases: Secondary | ICD-10-CM | POA: Insufficient documentation

## 2011-02-13 DIAGNOSIS — E109 Type 1 diabetes mellitus without complications: Secondary | ICD-10-CM | POA: Insufficient documentation

## 2011-02-13 DIAGNOSIS — Y9210 Unspecified residential institution as the place of occurrence of the external cause: Secondary | ICD-10-CM | POA: Insufficient documentation

## 2011-02-13 DIAGNOSIS — I10 Essential (primary) hypertension: Secondary | ICD-10-CM

## 2011-02-13 DIAGNOSIS — G589 Mononeuropathy, unspecified: Secondary | ICD-10-CM

## 2011-02-13 DIAGNOSIS — F3289 Other specified depressive episodes: Secondary | ICD-10-CM | POA: Insufficient documentation

## 2011-02-13 LAB — DIFFERENTIAL
Lymphocytes Relative: 39 % (ref 12–46)
Lymphs Abs: 2.3 10*3/uL (ref 0.7–4.0)
Monocytes Absolute: 0.4 10*3/uL (ref 0.1–1.0)
Monocytes Relative: 7 % (ref 3–12)
Neutro Abs: 3 10*3/uL (ref 1.7–7.7)

## 2011-02-13 LAB — COMPREHENSIVE METABOLIC PANEL
ALT: 21 U/L (ref 0–35)
CO2: 28 mEq/L (ref 19–32)
Calcium: 9.9 mg/dL (ref 8.4–10.5)
Creatinine, Ser: 1.35 mg/dL — ABNORMAL HIGH (ref 0.50–1.10)
GFR calc Af Amer: 50 mL/min — ABNORMAL LOW (ref >60)
GFR calc non Af Amer: 42 mL/min — ABNORMAL LOW (ref >60)
Glucose, Bld: 121 mg/dL — ABNORMAL HIGH (ref 70–99)
Sodium: 134 mEq/L — ABNORMAL LOW (ref 135–145)

## 2011-02-13 LAB — GLUCOSE, CAPILLARY: Glucose-Capillary: 255 mg/dL — ABNORMAL HIGH (ref 70–99)

## 2011-02-13 LAB — RAPID URINE DRUG SCREEN, HOSP PERFORMED
Benzodiazepines: POSITIVE — AB
Opiates: POSITIVE — AB

## 2011-02-13 LAB — CBC
HCT: 46.8 % — ABNORMAL HIGH (ref 36.0–46.0)
Hemoglobin: 16.1 g/dL — ABNORMAL HIGH (ref 12.0–15.0)
MCH: 30.6 pg (ref 26.0–34.0)
MCHC: 34.4 g/dL (ref 30.0–36.0)
MCV: 89 fL (ref 78.0–100.0)

## 2011-02-13 LAB — ETHANOL: Alcohol, Ethyl (B): <11 mg/dL (ref 0–11)

## 2011-02-14 LAB — GLUCOSE, CAPILLARY
Glucose-Capillary: 296 mg/dL — ABNORMAL HIGH (ref 70–99)
Glucose-Capillary: 72 mg/dL (ref 70–99)
Glucose-Capillary: 287 mg/dL — ABNORMAL HIGH (ref 70–99)
Glucose-Capillary: 273 mg/dL — ABNORMAL HIGH (ref 70–99)

## 2011-02-15 DIAGNOSIS — F3189 Other bipolar disorder: Secondary | ICD-10-CM

## 2011-02-15 DIAGNOSIS — F1911 Other psychoactive substance abuse, in remission: Secondary | ICD-10-CM

## 2011-02-15 LAB — GLUCOSE, CAPILLARY
Glucose-Capillary: 200 mg/dL — ABNORMAL HIGH (ref 70–99)
Glucose-Capillary: 53 mg/dL — ABNORMAL LOW (ref 70–99)

## 2011-02-16 LAB — GLUCOSE, CAPILLARY: Glucose-Capillary: 234 mg/dL — ABNORMAL HIGH (ref 70–99)

## 2011-02-16 LAB — BASIC METABOLIC PANEL
BUN: 11 mg/dL (ref 6–23)
CO2: 27 mEq/L (ref 19–32)
Chloride: 99 mEq/L (ref 96–112)
Creatinine, Ser: 1.55 mg/dL — ABNORMAL HIGH (ref 0.50–1.10)
Glucose, Bld: 244 mg/dL — ABNORMAL HIGH (ref 70–99)

## 2011-02-16 LAB — HEMOGLOBIN A1C: Hgb A1c MFr Bld: 7.4 % — ABNORMAL HIGH (ref <5.7)

## 2011-02-17 LAB — GLUCOSE, CAPILLARY: Glucose-Capillary: 210 mg/dL — ABNORMAL HIGH (ref 70–99)

## 2011-02-18 LAB — GLUCOSE, CAPILLARY: Glucose-Capillary: 191 mg/dL — ABNORMAL HIGH (ref 70–99)

## 2011-03-03 NOTE — Assessment & Plan Note (Signed)
  Monica [name], Monica [name]            ACCOUNT NO.:  [id number]  MEDICAL RECORD NO.:  [id number]  LOCATION:                                 FACILITY:  PHYSICIAN:  Vic Ripper, P.A.-C.DATE OF BIRTH:  [birth year]  DATE OF ADMISSION:  02/13/2011 DATE OF DISCHARGE:                      PSYCHIATRIC ADMISSION ASSESSMENT   This is a 50 year old divorced white female.  She presented as a walk-in here to the [facility].  She reported she was depressed and suicidal although she did have two bags with her.  They were packed. She reports a long history of depression and substance abuse.  Reports she has been clean since February 2009 and has been receiving ongoing psych treatment from Dr. Betti Cruz who referred her here.  Au contraire, was sent over to the ED.  She lit up like a Christmas tree.  She was positive for opiates.  She now admits that she was using heroin this morning, cocaine and positive for benzos, although she is out of her Klonopin which was prescribed 1 mg t.i.d., number 90, by Dr. Betti Cruz on June 19.  She admits to relationship stressors pending legal charges. Her next court date is July 23 and July 25.  She is pending three simple assault charges.  Her daughter is living with her grandmother, and she has been denied SSI services.  The intake reports that she has had multiple failed suicide attempts in the past and did not want to discuss her plan at this time.  She has been quite irritable, outspoken, indicates that she does not want substance abuse treatment and wants her Klonopin, that she has been without it all day.  The records indicate past psychiatric history.  She had a stay 2007 in [facility], 2008 at [facility]. She states that she is currently an outpatient with Dr. Betti Cruz.  SHE IS ALLERGIC TO NAPROXEN.  That is about all I have at the moment.  She has punched the window traumatizing her left hand.  We are going to have to send her over to the ED for evaluation and  treatment, and we will request no narcotics if possible.     Vic Ripper, P.A.-C.     MD/MEDQ  D:  02/13/2011  T:  02/14/2011  Job:  [id number]  Electronically Signed by Jaci Lazier ADAMS P.A.-C. on 02/20/2011 06:43:13 PM Electronically Signed by Eulogio Ditch  on 03/03/2011 05:06:33 PM
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
## 2011-04-29 LAB — HERPES SIMPLEX VIRUS CULTURE: Culture: NOT DETECTED

## 2011-05-01 LAB — DIFFERENTIAL
Basophils Relative: 1
Lymphocytes Relative: 32
Lymphs Abs: 1.6
Monocytes Absolute: 0.5
Monocytes Relative: 9
Neutro Abs: 2.9
Neutrophils Relative %: 57

## 2011-05-01 LAB — POCT PREGNANCY, URINE
Operator id: 234501
Preg Test, Ur: NEGATIVE

## 2011-05-01 LAB — URINALYSIS, ROUTINE W REFLEX MICROSCOPIC
Bilirubin Urine: NEGATIVE
Glucose, UA: NEGATIVE
Hgb urine dipstick: NEGATIVE
Protein, ur: NEGATIVE
Urobilinogen, UA: 0.2

## 2011-05-01 LAB — CBC
HCT: 35.5 — ABNORMAL LOW
Hemoglobin: 12.1
MCHC: 34.1
Platelets: 193
RDW: 14.5

## 2011-05-01 LAB — RAPID URINE DRUG SCREEN, HOSP PERFORMED
Benzodiazepines: NOT DETECTED
Cocaine: NOT DETECTED
Opiates: POSITIVE — AB
Tetrahydrocannabinol: NOT DETECTED

## 2011-05-01 LAB — COMPREHENSIVE METABOLIC PANEL
Albumin: 3.5
BUN: 11
Calcium: 9.1
Glucose, Bld: 43 — ABNORMAL LOW
Total Protein: 6.3

## 2011-05-01 LAB — ETHANOL: Alcohol, Ethyl (B): <5

## 2011-05-02 LAB — URINALYSIS, ROUTINE W REFLEX MICROSCOPIC
Bilirubin Urine: NEGATIVE
Glucose, UA: >1000 — AB
Hgb urine dipstick: NEGATIVE
Ketones, ur: NEGATIVE
Leukocytes, UA: NEGATIVE
Nitrite: NEGATIVE
Protein, ur: NEGATIVE
Specific Gravity, Urine: 1.015
Urobilinogen, UA: 0.2
pH: 6

## 2011-05-02 LAB — COMPREHENSIVE METABOLIC PANEL
AST: 19
Albumin: 3.1 — ABNORMAL LOW
Chloride: 108
Creatinine, Ser: 0.84
GFR calc Af Amer: >60
Total Bilirubin: 0.6

## 2011-05-02 LAB — CBC
Hemoglobin: 11.9 — ABNORMAL LOW
MCV: 83.5
MCV: 84.9
Platelets: 161
RBC: 4.2
WBC: 6
WBC: 6.2

## 2011-05-02 LAB — POCT I-STAT, CHEM 8
BUN: 11
Calcium, Ion: 1.13
Chloride: 107
Creatinine, Ser: 1.3 — ABNORMAL HIGH
Glucose, Bld: 354 — ABNORMAL HIGH
HCT: 37
Hemoglobin: 12.6
Potassium: 4.1
Sodium: 139
TCO2: 27

## 2011-05-02 LAB — HEMOGLOBIN A1C
Hgb A1c MFr Bld: 7.3 — ABNORMAL HIGH
Mean Plasma Glucose: 183

## 2011-05-02 LAB — DIFFERENTIAL
Eosinophils Absolute: 0.1
Lymphs Abs: 1.7
Monocytes Absolute: 0.4
Monocytes Relative: 6
Neutro Abs: 3.8
Neutrophils Relative %: 64

## 2011-05-02 LAB — GLUCOSE, CAPILLARY
Glucose-Capillary: 234 — ABNORMAL HIGH
Glucose-Capillary: 255 — ABNORMAL HIGH
Glucose-Capillary: 263 — ABNORMAL HIGH
Glucose-Capillary: 282 — ABNORMAL HIGH
Glucose-Capillary: 306 — ABNORMAL HIGH
Glucose-Capillary: 319 — ABNORMAL HIGH
Glucose-Capillary: 328 — ABNORMAL HIGH
Glucose-Capillary: 70

## 2011-05-02 LAB — URINE MICROSCOPIC-ADD ON

## 2011-05-02 LAB — HEPATIC FUNCTION PANEL
Albumin: 3.3 — ABNORMAL LOW
Total Bilirubin: 0.6
Total Protein: 6

## 2011-05-02 LAB — RAPID URINE DRUG SCREEN, HOSP PERFORMED
Amphetamines: NOT DETECTED
Benzodiazepines: POSITIVE — AB
Cocaine: NOT DETECTED
Opiates: POSITIVE — AB
Tetrahydrocannabinol: NOT DETECTED

## 2011-05-02 LAB — BASIC METABOLIC PANEL
Chloride: 111
GFR calc Af Amer: >60
Potassium: 3.9

## 2011-05-06 LAB — CBC
MCHC: 34.8
Platelets: 193
RDW: 14.6

## 2011-05-06 LAB — BASIC METABOLIC PANEL
BUN: 11
CO2: 25
Calcium: 8.6
GFR calc non Af Amer: 57 — ABNORMAL LOW
Glucose, Bld: 98

## 2011-05-06 LAB — DIFFERENTIAL
Basophils Absolute: 0
Basophils Relative: 1
Eosinophils Relative: 2
Lymphocytes Relative: 38
Neutro Abs: 3.8

## 2011-08-13 ENCOUNTER — Encounter (HOSPITAL_COMMUNITY): Payer: Self-pay | Admitting: *Deleted

## 2011-08-13 ENCOUNTER — Emergency Department (HOSPITAL_COMMUNITY)
Admission: EM | Admit: 2011-08-13 | Discharge: 2011-08-14 | Disposition: A | Discharge status: Home / Self Care | Payer: Medicaid Other | Attending: Emergency Medicine | Admitting: Emergency Medicine

## 2011-08-13 DIAGNOSIS — F1110 Opioid abuse, uncomplicated: Secondary | ICD-10-CM | POA: Insufficient documentation

## 2011-08-13 DIAGNOSIS — F17200 Nicotine dependence, unspecified, uncomplicated: Secondary | ICD-10-CM | POA: Insufficient documentation

## 2011-08-13 DIAGNOSIS — R45851 Suicidal ideations: Secondary | ICD-10-CM | POA: Insufficient documentation

## 2011-08-13 DIAGNOSIS — F1190 Opioid use, unspecified, uncomplicated: Secondary | ICD-10-CM

## 2011-08-13 DIAGNOSIS — Z8619 Personal history of other infectious and parasitic diseases: Secondary | ICD-10-CM | POA: Insufficient documentation

## 2011-08-13 HISTORY — DX: Bipolar disorder, unspecified: F31.9

## 2011-08-13 HISTORY — DX: Depression, unspecified: F32.A

## 2011-08-13 HISTORY — DX: Unspecified viral hepatitis C without hepatic coma: B19.20

## 2011-08-13 HISTORY — DX: Major depressive disorder, single episode, unspecified: F32.9

## 2011-08-13 LAB — URINALYSIS, ROUTINE W REFLEX MICROSCOPIC
Bilirubin Urine: NEGATIVE
Leukocytes, UA: NEGATIVE
Nitrite: NEGATIVE
Specific Gravity, Urine: 1.014 (ref 1.005–1.030)
Urobilinogen, UA: 0.2 mg/dL (ref 0.0–1.0)
pH: 6 (ref 5.0–8.0)

## 2011-08-13 LAB — COMPREHENSIVE METABOLIC PANEL
BUN: 16 mg/dL (ref 6–23)
CO2: 27 mEq/L (ref 19–32)
Calcium: 9 mg/dL (ref 8.4–10.5)
Creatinine, Ser: 1.25 mg/dL — ABNORMAL HIGH (ref 0.50–1.10)
GFR calc Af Amer: 58 mL/min — ABNORMAL LOW (ref >90)
GFR calc non Af Amer: 50 mL/min — ABNORMAL LOW (ref >90)
Glucose, Bld: 48 mg/dL — ABNORMAL LOW (ref 70–99)
Sodium: 140 mEq/L (ref 135–145)
Total Protein: 7.1 g/dL (ref 6.0–8.3)

## 2011-08-13 LAB — GLUCOSE, CAPILLARY
Glucose-Capillary: 105 mg/dL — ABNORMAL HIGH (ref 70–99)
Glucose-Capillary: 157 mg/dL — ABNORMAL HIGH (ref 70–99)
Glucose-Capillary: 123 mg/dL — ABNORMAL HIGH (ref 70–99)
Glucose-Capillary: 97 mg/dL (ref 70–99)
Glucose-Capillary: 108 mg/dL — ABNORMAL HIGH (ref 70–99)

## 2011-08-13 LAB — CBC
HCT: 40.5 % (ref 36.0–46.0)
MCH: 28.7 pg (ref 26.0–34.0)
MCV: 84.2 fL (ref 78.0–100.0)
Platelets: 211 10*3/uL (ref 150–400)
RBC: 4.81 MIL/uL (ref 3.87–5.11)
WBC: 6.6 10*3/uL (ref 4.0–10.5)

## 2011-08-13 LAB — DIFFERENTIAL
Eosinophils Absolute: 0.1 10*3/uL (ref 0.0–0.7)
Eosinophils Relative: 2 % (ref 0–5)
Lymphocytes Relative: 34 % (ref 12–46)
Lymphs Abs: 2.2 10*3/uL (ref 0.7–4.0)
Monocytes Absolute: 0.6 10*3/uL (ref 0.1–1.0)

## 2011-08-13 LAB — RAPID URINE DRUG SCREEN, HOSP PERFORMED
Amphetamines: NOT DETECTED
Benzodiazepines: POSITIVE — AB
Opiates: POSITIVE — AB

## 2011-08-13 MED ORDER — GLUCOSE 40 % PO GEL
ORAL | Status: AC
  Administered 2011-08-13: 37.5 g
  Filled 2011-08-13: qty 1

## 2011-08-13 MED ORDER — INSULIN GLARGINE 100 UNIT/ML ~~LOC~~ SOLN
23.0000 [IU] | Freq: Every day | SUBCUTANEOUS | Status: DC
  Filled 2011-08-13: qty 3

## 2011-08-13 MED ORDER — ACETAMINOPHEN 325 MG PO TABS
650.0000 mg | ORAL_TABLET | ORAL | Status: DC | PRN
  Filled 2011-08-13: qty 1

## 2011-08-13 MED ORDER — ONDANSETRON HCL 4 MG PO TABS: 4.0000 mg | ORAL_TABLET | Freq: Three times a day (TID) | ORAL | Status: DC | PRN

## 2011-08-13 MED ORDER — TRAMADOL HCL 50 MG PO TABS
50.0000 mg | ORAL_TABLET | Freq: Three times a day (TID) | ORAL | Status: DC
  Administered 2011-08-13 – 2011-08-14 (×4): 50 mg via ORAL
  Filled 2011-08-13 (×4): qty 1

## 2011-08-13 MED ORDER — ZIPRASIDONE HCL 20 MG PO CAPS
80.0000 mg | ORAL_CAPSULE | Freq: Two times a day (BID) | ORAL | Status: DC
  Administered 2011-08-13 – 2011-08-14 (×3): 80 mg via ORAL
  Filled 2011-08-13: qty 3
  Filled 2011-08-13 (×2): qty 4
  Filled 2011-08-13: qty 1

## 2011-08-13 MED ORDER — GABAPENTIN 400 MG PO CAPS
800.0000 mg | ORAL_CAPSULE | Freq: Three times a day (TID) | ORAL | Status: DC
  Administered 2011-08-13 – 2011-08-14 (×5): 800 mg via ORAL
  Filled 2011-08-13 (×9): qty 2

## 2011-08-13 MED ORDER — FUROSEMIDE 40 MG PO TABS
40.0000 mg | ORAL_TABLET | Freq: Three times a day (TID) | ORAL | Status: DC
  Administered 2011-08-13 – 2011-08-14 (×4): 40 mg via ORAL
  Filled 2011-08-13 (×9): qty 1

## 2011-08-13 MED ORDER — GLUCAGON HCL (RDNA) 1 MG IJ SOLR
INTRAMUSCULAR | Status: AC
  Filled 2011-08-13: qty 1

## 2011-08-13 MED ORDER — LORAZEPAM 1 MG PO TABS
1.0000 mg | ORAL_TABLET | Freq: Three times a day (TID) | ORAL | Status: DC | PRN
  Administered 2011-08-13 – 2011-08-14 (×3): 1 mg via ORAL
  Filled 2011-08-13 (×3): qty 1

## 2011-08-13 MED ORDER — LAMOTRIGINE 100 MG PO TABS
100.0000 mg | ORAL_TABLET | Freq: Two times a day (BID) | ORAL | Status: DC
Start: 2011-08-13 — End: 2011-08-14
  Administered 2011-08-13 – 2011-08-14 (×3): 100 mg via ORAL
  Filled 2011-08-13 (×6): qty 1

## 2011-08-13 MED ORDER — ZOLPIDEM TARTRATE 10 MG PO TABS
10.0000 mg | ORAL_TABLET | Freq: Every evening | ORAL | Status: DC | PRN
  Administered 2011-08-13: 10 mg via ORAL
  Filled 2011-08-13: qty 1

## 2011-08-13 MED ORDER — NICOTINE 21 MG/24HR TD PT24
21.0000 mg | MEDICATED_PATCH | Freq: Every day | TRANSDERMAL | Status: DC
  Administered 2011-08-13 – 2011-08-14 (×2): 21 mg via TRANSDERMAL
  Filled 2011-08-13 (×2): qty 1

## 2011-08-13 MED ORDER — PANTOPRAZOLE SODIUM 40 MG PO TBEC
40.0000 mg | DELAYED_RELEASE_TABLET | Freq: Every day | ORAL | Status: DC
  Administered 2011-08-14: 40 mg via ORAL
  Filled 2011-08-13: qty 1
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
## 2011-08-13 NOTE — ED Provider Notes (Signed)
History     CSN: [id number]  Arrival date & time 08/13/11  1018   First MD Initiated Contact with Patient 08/13/11 1100      Chief Complaint  Patient presents with  . V70.1    (Consider location/radiation/quality/duration/timing/severity/associated sxs/prior treatment) Patient is a 51 y.o. female presenting with mental health disorder. The history is provided by the patient.  Mental Health Problem Primary symptoms comment: She is depressed and suicidal. She is heroin dependent and last used this morning at 6:00. The current episode started more than 1 month ago.  The degree of incapacity that she is experiencing as a consequence of her illness is moderate. Additional symptoms of the illness include anhedonia, appetite change and poor judgment. She admits to suicidal ideas. She does have a plan to commit suicide. She contemplates harming herself. She has not already injured self (She considers her heroin addiction as 'self-harm' and suicidal behavior.).    Past Medical History  Diagnosis Date  . Diabetes mellitus   . Bipolar disorder   . Depression   . Hepatitis C     Past Surgical History  Procedure Date  . Cesarean section     No family history on file.  History  Substance Use Topics  . Smoking status: Current Everyday Smoker -- 1.0 packs/day  . Smokeless tobacco: Not on file  . Alcohol Use: No    OB History    Grav Para Term Preterm Abortions TAB SAB Ect Mult Living                  Review of Systems  Constitutional: Positive for appetite change. Negative for fever and chills.  HENT: Negative.   Respiratory: Negative.   Cardiovascular: Negative.   Gastrointestinal: Negative.   Musculoskeletal: Negative.   Skin: Negative.   Neurological: Negative.   Psychiatric/Behavioral: Positive for suicidal ideas.    Allergies  Review of patient's allergies indicates no known allergies.  Home Medications  No current outpatient prescriptions on file.  BP 118/70   Pulse 66  Temp(Src) 97.9 F (36.6 C) (Oral)  Resp 18  Wt 180 lb (81.647 kg)  SpO2 97%  Physical Exam  Constitutional: She appears well-developed and well-nourished.  HENT:  Head: Normocephalic.  Neck: Normal range of motion. Neck supple.  Cardiovascular: Normal rate and regular rhythm.   Pulmonary/Chest: Effort normal and breath sounds normal.  Abdominal: Soft. Bowel sounds are normal. There is no tenderness. There is no rebound and no guarding.  Musculoskeletal: Normal range of motion.  Neurological: She is alert. No cranial nerve deficit.  Skin: Skin is warm and dry. No rash noted.  Psychiatric: She has a normal mood and affect.    ED Course  Procedures (including critical care time)   Labs Reviewed  URINE RAPID DRUG SCREEN (HOSP PERFORMED)  CBC  DIFFERENTIAL  COMPREHENSIVE METABOLIC PANEL  URINALYSIS, ROUTINE W REFLEX MICROSCOPIC  ETHANOL   No results found.   No diagnosis found.    MDM  The patient is moved to the psych ED to await [facility] evaluation.         Rodena Medin, PA-C 08/13/11 1233

## 2011-08-13 NOTE — ED Notes (Signed)
Pt. Had 4 bags with her belongings in it. They were placed in lockers 32 and 35 all bags have labels on them

## 2011-08-13 NOTE — ED Notes (Signed)
Recheck of CBG = 105. Pt eating lunch presently. Appears more energetic.

## 2011-08-13 NOTE — ED Notes (Signed)
Pt. Undressed and placed in blue scrubs wanded by security. Pt has one belongings bag at the RN station in triage, label is on the bag.

## 2011-08-13 NOTE — ED Notes (Signed)
Pt says she's no longer suicidal she just did that last night out of anger and she's not sure if she wants to detox or not. She knows she's too old to be doing it but she doesn't want to be taken off her ultram or klonopin. Safety maintained.

## 2011-08-13 NOTE — ED Notes (Signed)
Pharm called to ask that a tech come and review pt's meds as they feel error was made and meds are missing from their list.

## 2011-08-13 NOTE — ED Notes (Signed)
Pt reiterates story told to triage nurse concerning living arrangements and her mother keeping her daughter. Pt states she has been through heroin detox prior and stayed clean for several years. Cannot say exactly why she started using again. Has court hearing for 2nd charge of simple assault this month.

## 2011-08-13 NOTE — ED Notes (Signed)
States that she used heroin last pm to attempt suicide and is a chronic heroin user. States that she has a plan but stated, "I don't want to tell you."

## 2011-08-13 NOTE — ED Notes (Signed)
cbg 157 

## 2011-08-13 NOTE — ED Notes (Signed)
Pt glucose 37. Given glucose gel 37.5mg  orally. Recheck of CBG in 20 mins.

## 2011-08-13 NOTE — ED Provider Notes (Signed)
Medical screening examination/treatment/procedure(s) were performed by non-physician practitioner and as supervising physician I was immediately available for consultation/collaboration.  German Manke P Quintus Premo, MD 08/13/11 1537 

## 2011-08-14 MED ORDER — DICLOFENAC SODIUM 75 MG PO TBEC
75.0000 mg | DELAYED_RELEASE_TABLET | Freq: Two times a day (BID) | ORAL | Status: DC
  Administered 2011-08-14: 75 mg via ORAL
  Filled 2011-08-14 (×6): qty 1

## 2011-08-14 MED ORDER — CLONAZEPAM 1 MG PO TABS
1.0000 mg | ORAL_TABLET | Freq: Three times a day (TID) | ORAL | Status: DC
  Administered 2011-08-14: 1 mg via ORAL
  Filled 2011-08-14: qty 1

## 2011-08-14 MED ORDER — HYDROXYZINE PAMOATE 25 MG PO CAPS
25.0000 mg | ORAL_CAPSULE | Freq: Three times a day (TID) | ORAL | Status: DC | PRN
  Administered 2011-08-14: 50 mg via ORAL
  Filled 2011-08-14: qty 2

## 2011-08-14 MED ORDER — INSULIN ASPART 100 UNIT/ML ~~LOC~~ SOLN
0.0000 [IU] | SUBCUTANEOUS | Status: DC
  Filled 2011-08-14: qty 3

## 2011-08-14 MED ORDER — INSULIN ASPART 100 UNIT/ML ~~LOC~~ SOLN
8.0000 [IU] | Freq: Once | SUBCUTANEOUS | Status: AC
  Administered 2011-08-14: 8 [IU] via SUBCUTANEOUS
  Filled 2011-08-14: qty 3

## 2011-08-14 MED ORDER — RAMELTEON 8 MG PO TABS
8.0000 mg | ORAL_TABLET | Freq: Every day | ORAL | Status: DC
  Filled 2011-08-14 (×2): qty 1

## 2011-08-14 MED ORDER — INSULIN REGULAR HUMAN 100 UNIT/ML IJ SOLN: 8.0000 [IU] | Freq: Once | INTRAMUSCULAR | Status: DC

## 2011-08-14 NOTE — ED Notes (Signed)
Sat with patient during tele psych. Asked pt after if she would like a shower. Patient still declining.

## 2011-08-14 NOTE — ED Notes (Signed)
States she needs something for her anxiety, medicated

## 2011-08-14 NOTE — ED Notes (Signed)
Patient would like to have the rest of her meds from her med req, call to EDP for psych, he is going to go over her meds and continue more of them, reported lunch time CBG as well

## 2011-08-14 NOTE — BH Assessment (Signed)
Assessment Note   Monica [name] is an 51 y.o. female. PT PRESENTS WITH INCREASE DEPRESSION INITIALLY & HAD ATTEMPTED TO KILL SELF BY OVERDOSING ON HEROINE. PT EXPRESSED THAT SHE WAS OVERWHELMED WITH LEGAL & HER DRUG PATTERN. PT SAYS SHE WAS FEELING DOWN BUT NOW FEELS BETTER. PT NOW DENIES CURRENT IDEATION & IS RELUCTANT TO BE DETOX. PT EXPRESSED IF SHE WOULD BE KEPT ON MEDS THAT SHE WAS OK TO DETOX BUT IF HER MEDS WERE GOING TO BE TAKEN AWAY THEN SHE RATHER GO DO IT AT HOME & GIVE HER SPOT TO SOMEONE ELSE. PT ADMITS TO USING 3 BAGS OF HEROINE DAILY SINCE IN HER 30'S. PT SAYS HER DOING HEROINE IS GET OLD & SHE IS READY TO STOP. PT CLAIMS SHE HAS NOT MONEY TO BUY DRUG & FEELS SHE CAN DO IT AT HOME. PT WILL BE HELD IN ED UNTIL SHE CAN BE RAN OVER AT [facility] IN AM.   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 Diabetes mellitus   . Bipolar disorder   . Depression   . Hepatitis C    Axis IV: other psychosocial or environmental problems and problems related to legal system/crime Axis V: 51-60 moderate symptoms  Past Medical History:  Past Medical History  Diagnosis Date  . Diabetes mellitus   . Bipolar disorder   . Depression   . Hepatitis C     Past Surgical History  Procedure Date  . Cesarean section     Family History: No family history on file.  Social History:  reports that she has been smoking.  She does not have any smokeless tobacco history on file. She reports that she uses illicit drugs. She reports that she does not drink alcohol.  Additional Social History:    Allergies: No Known Allergies  Home Medications:  Medications Prior to Admission  Medication Dose Route Frequency Provider Last Rate Last Dose  . acetaminophen (TYLENOL) tablet 650 mg  650 mg Oral Q4H PRN Rodena Medin, PA-C      . dextrose (GLUTOSE) 40 % oral gel        37.5 g at 08/13/11 1248  . furosemide (LASIX) tablet 40 mg  40 mg  Oral TID Rodena Medin, PA-C   40 mg at 08/13/11 1745  . gabapentin (NEURONTIN) capsule 800 mg  800 mg Oral TID Rodena Medin, PA-C   800 mg at 08/13/11 2353  . insulin glargine (LANTUS) injection 23 Units  23 Units Subcutaneous QHS Shari A Narvaez, PA-C      . lamoTRIgine (LAMICTAL) tablet 100 mg  100 mg Oral BID Rodena Medin, PA-C   100 mg at 08/13/11 2336  . LORazepam (ATIVAN) tablet 1 mg  1 mg Oral Q8H PRN Rodena Medin, PA-C   1 mg at 08/13/11 2340  . nicotine (NICODERM CQ - dosed in mg/24 hours) patch 21 mg  21 mg Transdermal Daily Rodena Medin, PA-C   21 mg at 08/13/11 1353  . ondansetron (ZOFRAN) tablet 4 mg  4 mg Oral Q8H PRN Rodena Medin, PA-C      . pantoprazole (PROTONIX) EC tablet 40 mg  40 mg Oral Q1200 Rodena Medin, PA-C      . traMADol (ULTRAM) tablet 50 mg  50 mg Oral TID Rodena Medin, PA-C   50 mg at 08/13/11 2336  . ziprasidone (GEODON) capsule 80 mg  80  mg Oral BID WC Rodena Medin, PA-C   80 mg at 08/13/11 1742  . zolpidem (AMBIEN) tablet 10 mg  10 mg Oral QHS PRN Rodena Medin, PA-C   10 mg at 08/13/11 2340  . DISCONTD: glucagon (GLUCAGEN) 1 MG injection            No current outpatient prescriptions on file as of 08/13/2011.    OB/GYN Status:  No LMP recorded. Patient is postmenopausal.  General Assessment Data Location of Assessment: [facility] ED ACT Assessment: Yes Living Arrangements: Relatives;Family members Can pt return to current living arrangement?: Yes Admission Status: Voluntary Is patient capable of signing voluntary admission?: Yes Transfer from: Acute Hospital Referral Source: Self/Family/Friend     Risk to self Suicidal Ideation: No Suicidal Intent: No Is patient at risk for suicide?: No Suicidal Plan?: No Access to Means: No What has been your use of drugs/alcohol within the last 12 months?: HEROINE STARTED AT AGE 5 & USES 3 BAGS DAILY & LAST USE WAS 08/13/11 Previous Attempts/Gestures: No How many times?: 0  Other Self Harm  Risks: NA Triggers for Past Attempts: Unpredictable Intentional Self Injurious Behavior: None Family Suicide History: Unknown Recent stressful life event(s): Legal Issues;Turmoil (Comment) Persecutory voices/beliefs?: No Depression: Yes Depression Symptoms: Loss of interest in usual pleasures;Feeling worthless/self pity Substance abuse history and/or treatment for substance abuse?: Yes Suicide prevention information given to non-admitted patients: Not applicable  Risk to Others Homicidal Ideation: No Thoughts of Harm to Others: No Current Homicidal Intent: No Current Homicidal Plan: No Access to Homicidal Means: No Identified Victim: NA History of harm to others?: No Assessment of Violence: None Noted Violent Behavior Description: CALM COOPERATIVE Does patient have access to weapons?: No Criminal Charges Pending?: No Does patient have a court date: No  Psychosis Hallucinations: None noted Delusions: None noted  Mental Status Report Appear/Hygiene: Improved Eye Contact: Good Motor Activity: Freedom of movement Speech: Logical/coherent Level of Consciousness: Alert Mood: Depressed;Sad;Preoccupied Affect: Depressed;Sad Anxiety Level: None Thought Processes: Coherent;Relevant Judgement: Unimpaired Orientation: Person;Place;Time;Situation Obsessive Compulsive Thoughts/Behaviors: None  Cognitive Functioning Concentration: Decreased Memory: Recent Intact;Remote Intact IQ: Average Insight: Poor Impulse Control: Poor Appetite: Poor Weight Loss: 0  Weight Gain: 0  Sleep: No Change Total Hours of Sleep: 8  Vegetative Symptoms: None  Prior Inpatient Therapy Prior Inpatient Therapy: Yes Prior Therapy Dates: 2012, 2011, 2010 Prior Therapy Facilty/Provider(s): [facility], [facility], [facility] Reason for Treatment: DETOX, REHAB  Prior Outpatient Therapy Prior Outpatient Therapy: Yes Prior Therapy Dates: CURRENT Prior Therapy Facilty/Provider(s): DR. Betti Cruz Reason for Treatment: MED  MANAGEMENT            Values / Beliefs Cultural Requests During Hospitalization: None Spiritual Requests During Hospitalization: None        Additional Information 1:1 In Past 12 Months?: No CIRT Risk: No Elopement Risk: No Does patient have medical clearance?: Yes     Disposition:  Disposition Disposition of Patient: Inpatient treatment program;Referred to [facility] PENDING [address] BED) Type of inpatient treatment program: Adult  On Site Evaluation by:   Reviewed with Physician:     Waldron Session 08/14/2011 12:26 AM

## 2011-08-14 NOTE — ED Notes (Signed)
pt was up in the hall talking to another pt at his door she became upset when asked to go back to her room states she wants to leave as she's not going to try to kill herself again, we aren't giving her her meds right and she can't walk around, md  Perkins said he's not addressing this and she'll be dispositioned by psych.

## 2011-08-14 NOTE — ED Notes (Signed)
Asked patient if she would like a shower. Patient declined stating "I don't feel like it right now. Maybe later." Encouraged pt to let me know if she changed her mind.

## 2011-08-14 NOTE — ED Notes (Signed)
C/o increased anxiety, vistaril 50 mg given

## 2011-08-14 NOTE — ED Notes (Signed)
Spoke w/MD about latest CBG reading, stated to inform patient to watch her CBGs after she is discharged

## 2011-08-14 NOTE — ED Provider Notes (Signed)
8:08 AM The patient was seen and evaluated by me this morning and is in no apparent distress, is awake and alert, and is aware of the current treatment plan.  The patient requests to go home. With her presentation of suicide attempt and ideation, I will initiate a tele-psychiatry consult to evaluate her suitability for discharge. The patient states her understanding of and agreement with this plan of care.  Felisa Bonier, MD 08/14/11 [phone]

## 2011-09-17 ENCOUNTER — Encounter (HOSPITAL_COMMUNITY): Payer: Self-pay | Admitting: *Deleted

## 2011-09-17 ENCOUNTER — Emergency Department (HOSPITAL_COMMUNITY)
Admission: EM | Admit: 2011-09-17 | Discharge: 2011-09-18 | Disposition: A | Discharge status: Other Acute Inpatient Hospital | Payer: Medicaid Other | Source: Home / Self Care | Attending: Family Medicine | Admitting: Family Medicine

## 2011-09-17 ENCOUNTER — Ambulatory Visit (HOSPITAL_COMMUNITY)
Admission: RE | Admit: 2011-09-17 | Discharge: 2011-09-17 | Disposition: A | Discharge status: Home / Self Care | Payer: Medicaid Other | Attending: Psychiatry | Admitting: Psychiatry

## 2011-09-17 DIAGNOSIS — F1110 Opioid abuse, uncomplicated: Secondary | ICD-10-CM | POA: Insufficient documentation

## 2011-09-17 DIAGNOSIS — Z794 Long term (current) use of insulin: Secondary | ICD-10-CM | POA: Insufficient documentation

## 2011-09-17 DIAGNOSIS — F17200 Nicotine dependence, unspecified, uncomplicated: Secondary | ICD-10-CM | POA: Insufficient documentation

## 2011-09-17 DIAGNOSIS — Z8619 Personal history of other infectious and parasitic diseases: Secondary | ICD-10-CM | POA: Insufficient documentation

## 2011-09-17 DIAGNOSIS — E119 Type 2 diabetes mellitus without complications: Secondary | ICD-10-CM | POA: Insufficient documentation

## 2011-09-17 LAB — CBC
HCT: 38 % (ref 36.0–46.0)
Hemoglobin: 13 g/dL (ref 12.0–15.0)
MCV: 83.9 fL (ref 78.0–100.0)
Platelets: 181 10*3/uL (ref 150–400)
RBC: 4.53 MIL/uL (ref 3.87–5.11)
WBC: 4.6 10*3/uL (ref 4.0–10.5)

## 2011-09-17 LAB — COMPREHENSIVE METABOLIC PANEL
AST: 33 U/L (ref 0–37)
BUN: 14 mg/dL (ref 6–23)
CO2: 30 mEq/L (ref 19–32)
Chloride: 101 mEq/L (ref 96–112)
Creatinine, Ser: 1.45 mg/dL — ABNORMAL HIGH (ref 0.50–1.10)
GFR calc Af Amer: 48 mL/min — ABNORMAL LOW (ref >90)
GFR calc non Af Amer: 42 mL/min — ABNORMAL LOW (ref >90)
Glucose, Bld: 117 mg/dL — ABNORMAL HIGH (ref 70–99)
Total Bilirubin: 0.3 mg/dL (ref 0.3–1.2)

## 2011-09-17 LAB — GLUCOSE, CAPILLARY
Glucose-Capillary: 138 mg/dL — ABNORMAL HIGH (ref 70–99)
Glucose-Capillary: 181 mg/dL — ABNORMAL HIGH (ref 70–99)

## 2011-09-17 LAB — RAPID URINE DRUG SCREEN, HOSP PERFORMED: Amphetamines: NOT DETECTED

## 2011-09-17 LAB — ACETAMINOPHEN LEVEL: Acetaminophen (Tylenol), Serum: <15 ug/mL (ref 10–30)

## 2011-09-17 MED ORDER — NICOTINE 21 MG/24HR TD PT24
MEDICATED_PATCH | TRANSDERMAL | Status: AC
  Administered 2011-09-17: 16:00:00
  Filled 2011-09-17: qty 1

## 2011-09-17 MED ORDER — CONJ ESTROG-MEDROXYPROGEST ACE 0.3-1.5 MG PO TABS
1.0000 | ORAL_TABLET | Freq: Every day | ORAL | Status: DC
  Filled 2011-09-17 (×3): qty 1

## 2011-09-17 MED ORDER — DICLOFENAC SODIUM 75 MG PO TBEC
75.0000 mg | DELAYED_RELEASE_TABLET | Freq: Two times a day (BID) | ORAL | Status: DC
  Administered 2011-09-17 – 2011-09-18 (×2): 75 mg via ORAL
  Filled 2011-09-17 (×6): qty 1

## 2011-09-17 MED ORDER — HYDROXYZINE PAMOATE 25 MG PO CAPS
25.0000 mg | ORAL_CAPSULE | Freq: Three times a day (TID) | ORAL | Status: DC | PRN
  Filled 2011-09-17: qty 2

## 2011-09-17 MED ORDER — ALUM & MAG HYDROXIDE-SIMETH 200-200-20 MG/5ML PO SUSP: 30.0000 mL | ORAL | Status: DC | PRN

## 2011-09-17 MED ORDER — ONDANSETRON HCL 4 MG PO TABS: 4.0000 mg | ORAL_TABLET | Freq: Three times a day (TID) | ORAL | Status: DC | PRN

## 2011-09-17 MED ORDER — LAMOTRIGINE 100 MG PO TABS
100.0000 mg | ORAL_TABLET | Freq: Two times a day (BID) | ORAL | Status: DC
  Administered 2011-09-17 – 2011-09-18 (×2): 100 mg via ORAL
  Filled 2011-09-17 (×6): qty 1

## 2011-09-17 MED ORDER — RAMELTEON 8 MG PO TABS
8.0000 mg | ORAL_TABLET | Freq: Every day | ORAL | Status: DC
  Administered 2011-09-17: 8 mg via ORAL
  Filled 2011-09-17 (×3): qty 1

## 2011-09-17 MED ORDER — HYDROXYZINE HCL 25 MG PO TABS
25.0000 mg | ORAL_TABLET | Freq: Three times a day (TID) | ORAL | Status: DC | PRN
  Administered 2011-09-17 – 2011-09-18 (×2): 50 mg via ORAL
  Filled 2011-09-17 (×2): qty 2

## 2011-09-17 MED ORDER — POTASSIUM CHLORIDE CRYS ER 20 MEQ PO TBCR
40.0000 meq | EXTENDED_RELEASE_TABLET | Freq: Two times a day (BID) | ORAL | Status: DC
  Administered 2011-09-17 – 2011-09-18 (×2): 40 meq via ORAL
  Filled 2011-09-17 (×3): qty 2

## 2011-09-17 MED ORDER — ACETAMINOPHEN 325 MG PO TABS: 650.0000 mg | ORAL_TABLET | ORAL | Status: DC | PRN

## 2011-09-17 MED ORDER — INSULIN GLARGINE 100 UNIT/ML ~~LOC~~ SOLN
23.0000 [IU] | Freq: Every day | SUBCUTANEOUS | Status: DC
  Administered 2011-09-17 – 2011-09-18 (×2): 23 [IU] via SUBCUTANEOUS
  Filled 2011-09-17: qty 3

## 2011-09-17 MED ORDER — CONJ ESTROG-MEDROXYPROGEST ACE 0.3-1.5 MG PO TABS: 1.0000 | ORAL_TABLET | Freq: Every day | ORAL | Status: DC

## 2011-09-17 MED ORDER — FUROSEMIDE 40 MG PO TABS
40.0000 mg | ORAL_TABLET | Freq: Three times a day (TID) | ORAL | Status: DC
  Administered 2011-09-17 – 2011-09-18 (×3): 40 mg via ORAL
  Filled 2011-09-17 (×9): qty 1

## 2011-09-17 MED ORDER — LORAZEPAM 1 MG PO TABS
1.0000 mg | ORAL_TABLET | Freq: Three times a day (TID) | ORAL | Status: DC | PRN
  Administered 2011-09-17 – 2011-09-18 (×3): 1 mg via ORAL
  Filled 2011-09-17 (×3): qty 1

## 2011-09-17 MED ORDER — INSULIN ASPART 100 UNIT/ML ~~LOC~~ SOLN
0.0000 [IU] | Freq: Three times a day (TID) | SUBCUTANEOUS | Status: DC
  Administered 2011-09-18: 8 [IU] via SUBCUTANEOUS
  Administered 2011-09-18: 3 [IU] via SUBCUTANEOUS
  Filled 2011-09-17: qty 3

## 2011-09-17 MED ORDER — GABAPENTIN 400 MG PO CAPS
800.0000 mg | ORAL_CAPSULE | Freq: Three times a day (TID) | ORAL | Status: DC
  Administered 2011-09-17 – 2011-09-18 (×3): 800 mg via ORAL
  Filled 2011-09-17 (×9): qty 2

## 2011-09-17 MED ORDER — ZIPRASIDONE HCL 20 MG PO CAPS
80.0000 mg | ORAL_CAPSULE | Freq: Two times a day (BID) | ORAL | Status: DC
  Administered 2011-09-18 (×2): 80 mg via ORAL
  Filled 2011-09-17 (×2): qty 4

## 2011-09-17 MED ORDER — IBUPROFEN 600 MG PO TABS: 600.0000 mg | ORAL_TABLET | Freq: Three times a day (TID) | ORAL | Status: DC | PRN

## 2011-09-17 MED ORDER — PANTOPRAZOLE SODIUM 40 MG PO TBEC
40.0000 mg | DELAYED_RELEASE_TABLET | Freq: Every day | ORAL | Status: DC
  Administered 2011-09-17 – 2011-09-18 (×2): 40 mg via ORAL
  Filled 2011-09-17 (×2): qty 1

## 2011-09-17 MED ORDER — ZOLPIDEM TARTRATE 5 MG PO TABS: 5.0000 mg | ORAL_TABLET | Freq: Every evening | ORAL | Status: DC | PRN

## 2011-09-17 MED ORDER — GABAPENTIN 800 MG PO TABS
800.0000 mg | ORAL_TABLET | Freq: Three times a day (TID) | ORAL | Status: DC
  Filled 2011-09-17 (×4): qty 1

## 2011-09-17 NOTE — BH Assessment (Signed)
Assessment Note   Monica [name] is an 51 y.o. female. Initially presented to [facility] for assessment. Sent for medical clearance. Pt requesting IPT detox for opiates. Explained process to pt and checked in with her. Will seek placement for detox.       Romeo Apple 09/17/2011 7:26 PM

## 2011-09-17 NOTE — ED Notes (Signed)
CBG 181.  

## 2011-09-17 NOTE — BH Assessment (Signed)
Assessment Note   Monica [name] is an 51 y.o. female. PT PRESENTS REQUESTING HELP TO GET OFF HER HEROINE ADDICTION. PT SAYS SHE USES 3 BAGS DAILY & SHE CANT KEEP ON LIVING THIS WAY. PT ADMITS TO BEING SOBER ONCE FOR 8 YRS & 3 YRS BEFORE SHE RELAPSED 5 MONTH AGO. PT DENIES ANY IDEATION  & IS ABLE TO CONTRACT FOR SAFETY. PT SAYS SHE CURRENTLY STAYS WITH SOME FRIENDS NOW BUT HAS BEEN HAVING SOME RELATIONSHIP ISSUES WITH BOYFRIEND & MOM AS WELL AS SOME FINANCIAL STRESSORS. PT WAS RAN BY PHYSICIAN WHO FELT PT WHO BE MEDICAL CLEARED BEFORE BE RAN. PT SAYS HER LAST USE WAS 3 BAGS THIS MORNING. PT CONSIDERS HER MOM, BF & DAUGHTER AS HER SUPPORT SYSTEMS. PT IS CURRENTLY DENYING CURRENT WITHDRAWAL OR PAIN & EATING/SLEEPING IS FINE.  Axis I: OPIOID DEPENDENCE Axis II: Deferred Axis III:  Past Medical History  Diagnosis Date  . Diabetes mellitus   . Bipolar disorder   . Depression   . Hepatitis C   . Psychotic episode    Axis IV: other psychosocial or environmental problems, problems related to social environment and problems with primary support group Axis V: 41-50 serious symptoms  Past Medical History:  Past Medical History  Diagnosis Date  . Diabetes mellitus   . Bipolar disorder   . Depression   . Hepatitis C   . Psychotic episode     Past Surgical History  Procedure Date  . Cesarean section     Family History: History reviewed. No pertinent family history.  Social History:  reports that she has been smoking Cigarettes.  She has been smoking about 1 pack per day. She does not have any smokeless tobacco history on file. She reports that she uses illicit drugs. She reports that she does not drink alcohol.  Additional Social History:    Allergies: No Known Allergies  Home Medications:  No current facility-administered medications on file as of 09/17/2011.   Medications Prior to Admission  Medication Sig Dispense Refill  . acetaminophen (TYLENOL) 500 MG tablet Take 500-1,000 mg  by mouth every 6 (six) hours as needed. PAIN PT TAKES 2 TABS FOR 1000MG  DOSE      . clonazePAM (KLONOPIN) 1 MG tablet Take 1 mg by mouth 3 (three) times daily.      . diclofenac (VOLTAREN) 75 MG EC tablet Take 75 mg by mouth 2 (two) times daily.      Marland Kitchen estrogen, conjugated,-medroxyprogesterone (PREMPRO) 0.3-1.5 MG per tablet Take 1 tablet by mouth daily.      . furosemide (LASIX) 40 MG tablet Take 40 mg by mouth 3 (three) times daily.      Marland Kitchen gabapentin (NEURONTIN) 800 MG tablet Take 800 mg by mouth 3 (three) times daily.      . hydrOXYzine (VISTARIL) 25 MG capsule Take 25-50 mg by mouth 3 (three) times daily as needed. PT CAN TAKE A TOTAL OF 50MG  ( 2 CAPSULES)      . insulin glargine (LANTUS) 100 UNIT/ML injection Inject 23 Units into the skin at bedtime.      . insulin lispro (HUMALOG) 100 UNIT/ML injection Inject 2-8 Units into the skin daily. PT USES SLIDING SCALE      . lamoTRIgine (LAMICTAL) 100 MG tablet Take 100 mg by mouth 2 (two) times daily.      Marland Kitchen omeprazole (PRILOSEC) 20 MG capsule Take 20 mg by mouth every morning.      . potassium chloride SA (K-DUR,KLOR-CON) 20 MEQ  tablet Take 40 mEq by mouth 2 (two) times daily.      . ramelteon (ROZEREM) 8 MG tablet Take 8 mg by mouth at bedtime.      . traMADol (ULTRAM) 50 MG tablet Take 50 mg by mouth 3 (three) times daily.      . ziprasidone (GEODON) 80 MG capsule Take 80 mg by mouth 2 (two) times daily with a meal.        OB/GYN Status:  No LMP recorded. Patient is postmenopausal.  General Assessment Data Location of Assessment: [facility] Assessment Services Living Arrangements: Friends Can pt return to current living arrangement?: Yes Admission Status: Voluntary Is patient capable of signing voluntary admission?: Yes Transfer from: Acute Hospital Referral Source: Self/Family/Friend     Risk to self Suicidal Ideation: No Suicidal Intent: No Suicidal Plan?: No Access to Means: No What has been your use of drugs/alcohol within the last  12 months?: HEROINE STARTED AT AGE 80 & SHE USES 3 BAGS DAILY FOR THE PAST 5 MONTHS BUT WAS SOBER FOR 3 YRS & LAST USE WAS 09/17/11 Previous Attempts/Gestures: No How many times?: 0  Other Self Harm Risks: NA Triggers for Past Attempts: Unpredictable Intentional Self Injurious Behavior: None Family Suicide History: Unknown Recent stressful life event(s): Legal Issues;Turmoil (Comment) Persecutory voices/beliefs?: No Depression: Yes Depression Symptoms: Loss of interest in usual pleasures;Feeling worthless/self pity Substance abuse history and/or treatment for substance abuse?: Yes Suicide prevention information given to non-admitted patients: Not applicable  Risk to Others Homicidal Ideation: No Thoughts of Harm to Others: No Current Homicidal Intent: No Current Homicidal Plan: No Access to Homicidal Means: No Identified Victim: NA History of harm to others?: No Assessment of Violence: None Noted Violent Behavior Description: CALM, ANXIOUS, COOPERATIVE Does patient have access to weapons?: No Criminal Charges Pending?: No Does patient have a court date: No  Psychosis Hallucinations: None noted Delusions: None noted  Mental Status Report Appear/Hygiene: Improved Eye Contact: Good Motor Activity: Freedom of movement Speech: Logical/coherent Level of Consciousness: Alert Mood: Anhedonia;Sad Affect: Depressed;Sad Anxiety Level: None Thought Processes: Coherent;Relevant Judgement: Impaired Orientation: Person;Place;Time;Situation Obsessive Compulsive Thoughts/Behaviors: None  Cognitive Functioning Concentration: Decreased Memory: Recent Intact;Remote Intact IQ: Average Insight: Poor Impulse Control: Poor Appetite: Poor Weight Loss: 0  Weight Gain: 0  Sleep: No Change Total Hours of Sleep: 3  Vegetative Symptoms: None  Prior Inpatient Therapy Prior Inpatient Therapy: Yes Prior Therapy Dates: 2012, 2013, 2011, 2010 Prior Therapy Facilty/Provider(s): [facility], [facility] Reason for Treatment: DETOX, REHAB  Prior Outpatient Therapy Prior Outpatient Therapy: Yes Prior Therapy Dates: CURRENT Prior Therapy Facilty/Provider(s): DR. Betti Cruz Reason for Treatment: MED MANAGEMENT            Values / Beliefs Cultural Requests During Hospitalization: None Spiritual Requests During Hospitalization: None        Additional Information 1:1 In Past 12 Months?: No CIRT Risk: No Elopement Risk: No Does patient have medical clearance?: No     Disposition:  Disposition Disposition of Patient: Inpatient treatment program;Referred to [facility] FOR MEDICAL CLEARANCE) Type of inpatient treatment program: Adult  On Site Evaluation by:   Reviewed with Physician:     Waldron Session 09/17/2011 1:16 PM

## 2011-09-17 NOTE — ED Notes (Signed)
Pt upset and requesting to leave, states "I'm not going to stay here if I can't get any medicine, I can do this shit at home", pt then requested to make a phone call and proceeded to yell and curse about being here to the person she was speaking to on the phone.

## 2011-09-17 NOTE — ED Notes (Signed)
Care assumed

## 2011-09-17 NOTE — Progress Notes (Signed)
CSW contacted patient's nurse and left message to advise patient will need labs completed and that information shared with [facility] so patient can be run by psychiatrist.  Patient was already assessed at [facility] as indicated in EPIC. Informed information would be passed on.  Ileene Hutchinson , MSW, LCSWA 09/17/2011 2:28 PM [phone]

## 2011-09-17 NOTE — ED Notes (Signed)
Very hard to get anything out of this patient, very sleepy, slurred speech when she does answer questions, denies Si or Hi and AV/H, doesn't know what got her started this last time on opiates. Just wants to detox and asked BF to bring her in to the ED for help.

## 2011-09-17 NOTE — ED Provider Notes (Signed)
History     CSN: [id number]  Arrival date & time 09/17/11  1213   First MD Initiated Contact with Patient 09/17/11 1227      Chief Complaint  Patient presents with  . Medical Clearance    pt requesting detox from heroin. states was at [facility] for admission-no beds- was sent here for medical clearance. Pt reports last use was this am around 0700. Pt denies complaints at present.     (Consider location/radiation/quality/duration/timing/severity/associated sxs/prior treatment) HPI Comments: Pt presents to the ED for detox from heroine. Pt states she is "shooting up 3 x a day 3 bags." Pt denies SI or HI, but reports clouded thoughts from "being high all the time." Pt denies visual/auditory hallucinations, fever, night sweats, chills, CP, SOB, DOE, leg swelling, or abdominal pain. Pt denies alcohol use.   The history is provided by the patient.    Past Medical History  Diagnosis Date  . Diabetes mellitus   . Bipolar disorder   . Depression   . Hepatitis C   . Psychotic episode     Past Surgical History  Procedure Date  . Cesarean section     History reviewed. No pertinent family history.  History  Substance Use Topics  . Smoking status: Current Everyday Smoker -- 1.0 packs/day    Types: Cigarettes  . Smokeless tobacco: Not on file  . Alcohol Use: No    OB History    Grav Para Term Preterm Abortions TAB SAB Ect Mult Living                  Review of Systems  All other systems reviewed and are negative.    Allergies  Review of patient's allergies indicates no known allergies.  Home Medications   Current Outpatient Rx  Name Route Sig Dispense Refill  . ACETAMINOPHEN 500 MG PO TABS Oral Take 500-1,000 mg by mouth every 6 (six) hours as needed. PAIN PT TAKES 2 TABS FOR 1000MG  DOSE    . CLONAZEPAM 1 MG PO TABS Oral Take 1 mg by mouth 3 (three) times daily.    Marland Kitchen DICLOFENAC SODIUM 75 MG PO TBEC Oral Take 75 mg by mouth 2 (two) times daily.    Marland Kitchen CONJ  ESTROG-MEDROXYPROGEST ACE 0.3-1.5 MG PO TABS Oral Take 1 tablet by mouth daily.    . FUROSEMIDE 40 MG PO TABS Oral Take 40 mg by mouth 3 (three) times daily.    Marland Kitchen GABAPENTIN 800 MG PO TABS Oral Take 800 mg by mouth 3 (three) times daily.    Marland Kitchen HYDROXYZINE PAMOATE 25 MG PO CAPS Oral Take 25-50 mg by mouth 3 (three) times daily as needed. PT CAN TAKE A TOTAL OF 50MG  ( 2 CAPSULES)    . INSULIN GLARGINE 100 UNIT/ML [place] SOLN Subcutaneous Inject 23 Units into the skin at bedtime.    . INSULIN LISPRO (HUMAN) 100 UNIT/ML [place] SOLN Subcutaneous Inject 2-8 Units into the skin daily. PT USES SLIDING SCALE    . LAMOTRIGINE 100 MG PO TABS Oral Take 100 mg by mouth 2 (two) times daily.    Marland Kitchen OMEPRAZOLE 20 MG PO CPDR Oral Take 20 mg by mouth every morning.    Marland Kitchen POTASSIUM CHLORIDE CRYS ER 20 MEQ PO TBCR Oral Take 40 mEq by mouth 2 (two) times daily.    Marland Kitchen RAMELTEON 8 MG PO TABS Oral Take 8 mg by mouth at bedtime.    . TRAMADOL HCL 50 MG PO TABS Oral Take 50 mg by mouth  3 (three) times daily.    Marland Kitchen ZIPRASIDONE HCL 80 MG PO CAPS Oral Take 80 mg by mouth 2 (two) times daily with a meal.      BP 112/66  Pulse 64  Temp(Src) 97.8 F (36.6 C) (Oral)  Resp 22  Wt 185 lb (83.915 kg)  SpO2 99%  Physical Exam  Constitutional: She is oriented to person, place, and time. She appears well-developed and well-nourished. No distress.  HENT:  Head: Normocephalic and atraumatic.  Eyes: Conjunctivae and EOM are normal. No scleral icterus.  Neck: Normal range of motion. Neck supple. No tracheal deviation present. No thyromegaly present.  Cardiovascular: Normal rate, regular rhythm, normal heart sounds and intact distal pulses.   Pulmonary/Chest: Effort normal and breath sounds normal. No stridor. No respiratory distress. She has no wheezes.  Abdominal: Soft. There is no tenderness.  Musculoskeletal: Normal range of motion. She exhibits no edema and no tenderness.  Neurological: She is alert and oriented to person, place, and  time. Coordination normal.  Skin: Skin is warm and dry. No rash noted. She is not diaphoretic. No erythema. No pallor.  Psychiatric: She has a normal mood and affect. Her behavior is normal.    ED Course  Procedures (including critical care time)  Labs Reviewed - No data to display No results found.   No diagnosis found.   ACT team consulted to give pt resources for OP detox places MDM  Substance abuse, heroine detox   Discussed Pt with Dr. Karma Ganja. Pt waiting for placement of detox OP center per act. Dr. Karma Ganja to resume care      Jaci Carrel, [place] 09/20/11 0002

## 2011-09-17 NOTE — ED Notes (Signed)
Pt. Has 4 bags in locker

## 2011-09-18 ENCOUNTER — Encounter (HOSPITAL_COMMUNITY): Payer: Self-pay | Admitting: *Deleted

## 2011-09-18 ENCOUNTER — Inpatient Hospital Stay (HOSPITAL_COMMUNITY)
Admission: AD | Admit: 2011-09-18 | Discharge: 2011-09-22 | DRG: 897 | Disposition: A | Discharge status: Home / Self Care | Payer: Medicaid Other | Source: Ambulatory Visit | Attending: Psychiatry | Admitting: Psychiatry

## 2011-09-18 DIAGNOSIS — F1310 Sedative, hypnotic or anxiolytic abuse, uncomplicated: Secondary | ICD-10-CM | POA: Diagnosis present

## 2011-09-18 DIAGNOSIS — F319 Bipolar disorder, unspecified: Secondary | ICD-10-CM

## 2011-09-18 DIAGNOSIS — B1920 Unspecified viral hepatitis C without hepatic coma: Secondary | ICD-10-CM

## 2011-09-18 DIAGNOSIS — Z794 Long term (current) use of insulin: Secondary | ICD-10-CM

## 2011-09-18 DIAGNOSIS — F1120 Opioid dependence, uncomplicated: Secondary | ICD-10-CM | POA: Diagnosis present

## 2011-09-18 DIAGNOSIS — Z79899 Other long term (current) drug therapy: Secondary | ICD-10-CM

## 2011-09-18 DIAGNOSIS — F19939 Other psychoactive substance use, unspecified with withdrawal, unspecified: Secondary | ICD-10-CM

## 2011-09-18 DIAGNOSIS — E119 Type 2 diabetes mellitus without complications: Secondary | ICD-10-CM

## 2011-09-18 LAB — GLUCOSE, CAPILLARY: Glucose-Capillary: 309 mg/dL — ABNORMAL HIGH (ref 70–99)

## 2011-09-18 MED ORDER — DICLOFENAC SODIUM 75 MG PO TBEC
75.0000 mg | DELAYED_RELEASE_TABLET | Freq: Two times a day (BID) | ORAL | Status: DC
  Administered 2011-09-19 (×2): 75 mg via ORAL
  Filled 2011-09-18 (×3): qty 1

## 2011-09-18 MED ORDER — DICYCLOMINE HCL 20 MG PO TABS
20.0000 mg | ORAL_TABLET | ORAL | Status: DC | PRN
  Administered 2011-09-21: 20 mg via ORAL
  Filled 2011-09-18: qty 1

## 2011-09-18 MED ORDER — HYDROXYZINE HCL 25 MG PO TABS
25.0000 mg | ORAL_TABLET | Freq: Four times a day (QID) | ORAL | Status: DC | PRN
  Administered 2011-09-19: 25 mg via ORAL
  Filled 2011-09-18: qty 1

## 2011-09-18 MED ORDER — CLONIDINE HCL 0.1 MG PO TABS
0.1000 mg | ORAL_TABLET | ORAL | Status: DC
  Administered 2011-09-21: 0.1 mg via ORAL
  Filled 2011-09-18 (×4): qty 1

## 2011-09-18 MED ORDER — ONDANSETRON 4 MG PO TBDP
4.0000 mg | ORAL_TABLET | Freq: Four times a day (QID) | ORAL | Status: DC | PRN
  Administered 2011-09-20: 4 mg via ORAL
  Filled 2011-09-18: qty 1

## 2011-09-18 MED ORDER — INSULIN GLARGINE 100 UNIT/ML ~~LOC~~ SOLN: 23.0000 [IU] | Freq: Every day | SUBCUTANEOUS | Status: DC

## 2011-09-18 MED ORDER — LOPERAMIDE HCL 2 MG PO CAPS
2.0000 mg | ORAL_CAPSULE | ORAL | Status: DC | PRN
Start: 2011-09-18 — End: 2011-09-22

## 2011-09-18 MED ORDER — NICOTINE 21 MG/24HR TD PT24
21.0000 mg | MEDICATED_PATCH | Freq: Every day | TRANSDERMAL | Status: DC
  Administered 2011-09-19 – 2011-09-21 (×4): 21 mg via TRANSDERMAL
  Filled 2011-09-18 (×4): qty 1

## 2011-09-18 MED ORDER — ALUM & MAG HYDROXIDE-SIMETH 200-200-20 MG/5ML PO SUSP: 30.0000 mL | ORAL | Status: DC | PRN

## 2011-09-18 MED ORDER — POTASSIUM CHLORIDE CRYS ER 20 MEQ PO TBCR
40.0000 meq | EXTENDED_RELEASE_TABLET | Freq: Two times a day (BID) | ORAL | Status: DC
  Administered 2011-09-19: 40 meq via ORAL
  Filled 2011-09-18 (×3): qty 2

## 2011-09-18 MED ORDER — ZIPRASIDONE HCL 80 MG PO CAPS
80.0000 mg | ORAL_CAPSULE | Freq: Two times a day (BID) | ORAL | Status: DC
  Administered 2011-09-19: 80 mg via ORAL
  Filled 2011-09-18 (×2): qty 1

## 2011-09-18 MED ORDER — NICOTINE 21 MG/24HR TD PT24
MEDICATED_PATCH | TRANSDERMAL | Status: AC
  Filled 2011-09-18: qty 1

## 2011-09-18 MED ORDER — CONJ ESTROG-MEDROXYPROGEST ACE 0.3-1.5 MG PO TABS: 1.0000 | ORAL_TABLET | Freq: Every day | ORAL | Status: DC

## 2011-09-18 MED ORDER — NAPROXEN 500 MG PO TABS: 500.0000 mg | ORAL_TABLET | Freq: Two times a day (BID) | ORAL | Status: DC | PRN

## 2011-09-18 MED ORDER — CLONIDINE HCL 0.1 MG PO TABS
0.1000 mg | ORAL_TABLET | Freq: Every day | ORAL | Status: DC
  Administered 2011-09-21: 0.1 mg via ORAL

## 2011-09-18 MED ORDER — PANTOPRAZOLE SODIUM 40 MG PO TBEC
40.0000 mg | DELAYED_RELEASE_TABLET | Freq: Every day | ORAL | Status: DC
  Administered 2011-09-19 – 2011-09-21 (×4): 40 mg via ORAL
  Filled 2011-09-18 (×5): qty 1

## 2011-09-18 MED ORDER — GABAPENTIN 400 MG PO CAPS
800.0000 mg | ORAL_CAPSULE | Freq: Three times a day (TID) | ORAL | Status: DC
  Administered 2011-09-19 – 2011-09-21 (×9): 800 mg via ORAL
  Filled 2011-09-18 (×12): qty 2

## 2011-09-18 MED ORDER — FUROSEMIDE 40 MG PO TABS
40.0000 mg | ORAL_TABLET | Freq: Three times a day (TID) | ORAL | Status: DC
Start: 2011-09-19 — End: 2011-09-19
  Administered 2011-09-19: 40 mg via ORAL
  Filled 2011-09-18 (×3): qty 1

## 2011-09-18 MED ORDER — CLONIDINE HCL 0.1 MG PO TABS
0.1000 mg | ORAL_TABLET | Freq: Four times a day (QID) | ORAL | Status: AC
  Administered 2011-09-19 – 2011-09-20 (×7): 0.1 mg via ORAL
  Filled 2011-09-18 (×8): qty 1

## 2011-09-18 MED ORDER — ACETAMINOPHEN 325 MG PO TABS: 650.0000 mg | ORAL_TABLET | ORAL | Status: DC | PRN

## 2011-09-18 MED ORDER — INSULIN ASPART 100 UNIT/ML ~~LOC~~ SOLN
0.0000 [IU] | Freq: Three times a day (TID) | SUBCUTANEOUS | Status: DC
  Administered 2011-09-19: 5 [IU] via SUBCUTANEOUS

## 2011-09-18 MED ORDER — NICOTINE 21 MG/24HR TD PT24
21.0000 mg | MEDICATED_PATCH | Freq: Every day | TRANSDERMAL | Status: DC
  Administered 2011-09-18: 21 mg via TRANSDERMAL

## 2011-09-18 MED ORDER — LAMOTRIGINE 100 MG PO TABS
100.0000 mg | ORAL_TABLET | Freq: Two times a day (BID) | ORAL | Status: DC
  Administered 2011-09-19 – 2011-09-21 (×7): 100 mg via ORAL
  Filled 2011-09-18 (×9): qty 1

## 2011-09-18 MED ORDER — METHOCARBAMOL 500 MG PO TABS: 500.0000 mg | ORAL_TABLET | Freq: Three times a day (TID) | ORAL | Status: DC | PRN

## 2011-09-18 NOTE — Tx Team (Signed)
Initial Interdisciplinary Treatment Plan  PATIENT STRENGTHS: (choose at least two) Communication skills General fund of knowledge Physical Health  PATIENT STRESSORS: Legal issue Substance abuse   PROBLEM LIST: Problem List/Patient Goals Date to be addressed Date deferred Reason deferred Estimated date of resolution  Substance Abuse: Heroin 09/18/11                                                      DISCHARGE CRITERIA:  Ability to meet basic life and health needs Adequate post-discharge living arrangements Improved stabilization in mood, thinking, and/or behavior Medical problems require only outpatient monitoring Motivation to continue treatment in a less acute level of care Withdrawal symptoms are absent or subacute and managed without 24-hour nursing intervention  PRELIMINARY DISCHARGE PLAN: Attend aftercare/continuing care group Attend 12-step recovery group Participate in family therapy Return to previous living arrangement  PATIENT/FAMIILY INVOLVEMENT: This treatment plan has been presented to and reviewed with the patient, Monica [name], and/or family member.  The patient and family have been given the opportunity to ask questions and make suggestions.  [name] 09/18/2011, 11:42 PM
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
## 2011-09-18 NOTE — ED Notes (Signed)
Patient offered shower. Stated she wanted to take one but not at this time. Encouraged pt to let me know when she changed her mind.

## 2011-09-18 NOTE — ED Provider Notes (Signed)
She is currently sleeping and without complaints. She is awaiting placement for detox from opiates.  Dione Booze, MD 09/18/11 [phone]

## 2011-09-19 LAB — GLUCOSE, CAPILLARY
Glucose-Capillary: 180 mg/dL — ABNORMAL HIGH (ref 70–99)
Glucose-Capillary: 263 mg/dL — ABNORMAL HIGH (ref 70–99)
Glucose-Capillary: 247 mg/dL — ABNORMAL HIGH (ref 70–99)
Glucose-Capillary: 385 mg/dL — ABNORMAL HIGH (ref 70–99)

## 2011-09-19 MED ORDER — CHLORDIAZEPOXIDE HCL 25 MG PO CAPS: 25.0000 mg | ORAL_CAPSULE | Freq: Every day | ORAL | Status: DC

## 2011-09-19 MED ORDER — ONDANSETRON 4 MG PO TBDP: 4.0000 mg | ORAL_TABLET | Freq: Four times a day (QID) | ORAL | Status: DC | PRN

## 2011-09-19 MED ORDER — POTASSIUM CHLORIDE CRYS ER 20 MEQ PO TBCR
40.0000 meq | EXTENDED_RELEASE_TABLET | Freq: Every day | ORAL | Status: DC
  Administered 2011-09-20 – 2011-09-21 (×2): 40 meq via ORAL
  Filled 2011-09-19 (×4): qty 2

## 2011-09-19 MED ORDER — CONJ ESTROG-MEDROXYPROGEST ACE 0.3-1.5 MG PO TABS
1.0000 | ORAL_TABLET | Freq: Every day | ORAL | Status: DC
  Administered 2011-09-19 – 2011-09-21 (×3): 1 via ORAL

## 2011-09-19 MED ORDER — INSULIN ASPART 100 UNIT/ML ~~LOC~~ SOLN: 0.0000 [IU] | Freq: Every day | SUBCUTANEOUS | Status: DC

## 2011-09-19 MED ORDER — QUETIAPINE FUMARATE 25 MG PO TABS
75.0000 mg | ORAL_TABLET | Freq: Every day | ORAL | Status: DC
  Administered 2011-09-19: 75 mg via ORAL
  Filled 2011-09-19 (×2): qty 3

## 2011-09-19 MED ORDER — CHLORDIAZEPOXIDE HCL 25 MG PO CAPS
50.0000 mg | ORAL_CAPSULE | Freq: Once | ORAL | Status: AC
  Administered 2011-09-19: 50 mg via ORAL
  Filled 2011-09-19: qty 2

## 2011-09-19 MED ORDER — LOPERAMIDE HCL 2 MG PO CAPS: 2.0000 mg | ORAL_CAPSULE | ORAL | Status: DC | PRN

## 2011-09-19 MED ORDER — THIAMINE HCL 100 MG/ML IJ SOLN: 100.0000 mg | Freq: Once | INTRAMUSCULAR | Status: DC

## 2011-09-19 MED ORDER — INSULIN LISPRO 100 UNIT/ML ~~LOC~~ SOLN: 2.0000 [IU] | Freq: Every day | SUBCUTANEOUS | Status: DC

## 2011-09-19 MED ORDER — HYDROXYZINE HCL 25 MG PO TABS: 25.0000 mg | ORAL_TABLET | Freq: Four times a day (QID) | ORAL | Status: DC | PRN

## 2011-09-19 MED ORDER — LAMOTRIGINE 100 MG PO TABS: 100.0000 mg | ORAL_TABLET | Freq: Two times a day (BID) | ORAL | Status: DC

## 2011-09-19 MED ORDER — VITAMIN B-1 100 MG PO TABS
100.0000 mg | ORAL_TABLET | Freq: Every day | ORAL | Status: DC
  Administered 2011-09-21: 100 mg via ORAL
  Filled 2011-09-19 (×4): qty 1

## 2011-09-19 MED ORDER — ADULT MULTIVITAMIN W/MINERALS CH
1.0000 | ORAL_TABLET | Freq: Every day | ORAL | Status: DC
  Administered 2011-09-19 – 2011-09-21 (×3): 1 via ORAL
  Filled 2011-09-19 (×3): qty 1

## 2011-09-19 MED ORDER — DICLOFENAC SODIUM 75 MG PO TBEC
75.0000 mg | DELAYED_RELEASE_TABLET | Freq: Two times a day (BID) | ORAL | Status: DC
  Administered 2011-09-19 – 2011-09-21 (×5): 75 mg via ORAL
  Filled 2011-09-19 (×7): qty 1

## 2011-09-19 MED ORDER — INSULIN ASPART 100 UNIT/ML ~~LOC~~ SOLN
3.0000 [IU] | Freq: Three times a day (TID) | SUBCUTANEOUS | Status: DC
  Administered 2011-09-19 – 2011-09-21 (×5): 3 [IU] via SUBCUTANEOUS

## 2011-09-19 MED ORDER — GABAPENTIN 800 MG PO TABS: 800.0000 mg | ORAL_TABLET | Freq: Three times a day (TID) | ORAL | Status: DC

## 2011-09-19 MED ORDER — INSULIN ASPART 100 UNIT/ML ~~LOC~~ SOLN: 0.0000 [IU] | Freq: Three times a day (TID) | SUBCUTANEOUS | Status: DC

## 2011-09-19 MED ORDER — FUROSEMIDE 40 MG PO TABS
40.0000 mg | ORAL_TABLET | Freq: Three times a day (TID) | ORAL | Status: DC
  Administered 2011-09-19 – 2011-09-21 (×8): 40 mg via ORAL
  Filled 2011-09-19 (×9): qty 1

## 2011-09-19 MED ORDER — CHLORDIAZEPOXIDE HCL 25 MG PO CAPS: 25.0000 mg | ORAL_CAPSULE | Freq: Four times a day (QID) | ORAL | Status: DC | PRN

## 2011-09-19 MED ORDER — PANTOPRAZOLE SODIUM 40 MG PO TBEC: 40.0000 mg | DELAYED_RELEASE_TABLET | Freq: Every day | ORAL | Status: DC

## 2011-09-19 MED ORDER — POTASSIUM CHLORIDE CRYS ER 20 MEQ PO TBCR: 40.0000 meq | EXTENDED_RELEASE_TABLET | Freq: Two times a day (BID) | ORAL | Status: DC

## 2011-09-19 MED ORDER — CHLORDIAZEPOXIDE HCL 25 MG PO CAPS
25.0000 mg | ORAL_CAPSULE | Freq: Three times a day (TID) | ORAL | Status: AC
  Administered 2011-09-20 (×3): 25 mg via ORAL
  Filled 2011-09-19 (×4): qty 1

## 2011-09-19 MED ORDER — INSULIN GLARGINE 100 UNIT/ML ~~LOC~~ SOLN
23.0000 [IU] | Freq: Every day | SUBCUTANEOUS | Status: DC
  Administered 2011-09-19 – 2011-09-21 (×3): 23 [IU] via SUBCUTANEOUS
  Filled 2011-09-19: qty 3

## 2011-09-19 MED ORDER — CHLORDIAZEPOXIDE HCL 25 MG PO CAPS
25.0000 mg | ORAL_CAPSULE | ORAL | Status: AC
  Administered 2011-09-21 (×2): 25 mg via ORAL
  Filled 2011-09-19 (×3): qty 1

## 2011-09-19 MED ORDER — INSULIN ASPART 100 UNIT/ML ~~LOC~~ SOLN
0.0000 [IU] | Freq: Three times a day (TID) | SUBCUTANEOUS | Status: DC
  Administered 2011-09-19: 15 [IU] via SUBCUTANEOUS
  Administered 2011-09-20: 11 [IU] via SUBCUTANEOUS
  Administered 2011-09-21: 5 [IU] via SUBCUTANEOUS
  Administered 2011-09-21: 6 [IU] via SUBCUTANEOUS
  Filled 2011-09-19: qty 3

## 2011-09-19 MED ORDER — NICOTINE 21 MG/24HR TD PT24: 21.0000 mg | MEDICATED_PATCH | Freq: Every day | TRANSDERMAL | Status: DC

## 2011-09-19 MED ORDER — INSULIN ASPART 100 UNIT/ML ~~LOC~~ SOLN
4.0000 [IU] | Freq: Three times a day (TID) | SUBCUTANEOUS | Status: DC
  Filled 2011-09-19: qty 3

## 2011-09-19 MED ORDER — CHLORDIAZEPOXIDE HCL 25 MG PO CAPS
25.0000 mg | ORAL_CAPSULE | Freq: Four times a day (QID) | ORAL | Status: AC
  Administered 2011-09-19 (×3): 25 mg via ORAL
  Filled 2011-09-19 (×2): qty 1

## 2011-09-19 MED ORDER — INSULIN GLARGINE 100 UNIT/ML ~~LOC~~ SOLN: 23.0000 [IU] | Freq: Every day | SUBCUTANEOUS | Status: DC

## 2011-09-19 MED ORDER — ZIPRASIDONE HCL 80 MG PO CAPS
80.0000 mg | ORAL_CAPSULE | Freq: Two times a day (BID) | ORAL | Status: DC
  Administered 2011-09-19 – 2011-09-21 (×5): 80 mg via ORAL
  Filled 2011-09-19 (×7): qty 1
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
## 2011-09-19 NOTE — Progress Notes (Signed)
Pt denies SI/HI/AVH. Pt sad and depressed. Pt states that she slept poor and her appetite is poor. Pt complained of agitation. Continue Q15 min checks. Pt receptive.

## 2011-09-19 NOTE — Progress Notes (Signed)
Pt attended morning group but was taken out to be seen by the PA. Met with pt following group. Pt in good mood and currently denies any withdrawal symptoms. States that it was her idea to come to the hospital to detox from heroine. Pt states that she has been using heroine for the past six months but was sober for three years prior to her relapse. Pt states that she was able to maintain her sobriety because her boyfriend who is also a heroine addict was in jail during this period. Pt states that she is currently living with her boyfriend in [place] and immediately relapsed when he returned home. Pt states that she has been admitted to [facility] several times prior to this hospitalization. Pt states that she has also been to several different rehab programs which include [facility] and [facility]. Pt states that she would like to attend NA meetings after her d/c and hopes to start up an NA group in [place] in the near future. SW will assess for appropriate referrals.   Reyes Ivan, LCSWA 09/19/2011  1:21 PM

## 2011-09-19 NOTE — Progress Notes (Signed)
Inpatient Diabetes Program Recommendations  AACE/ADA: New Consensus Statement on Inpatient Glycemic Control (2009)  Target Ranges:  Prepandial:   less than 140 mg/dL      Peak postprandial:   less than 180 mg/dL (1-2 hours)      Critically ill patients:  140 - 180 mg/dL   Reason for Visit: Hyperglycemia  Results for FREDERICA, CHRESTMAN (MRN [id number]) as of 09/19/2011 09:48  Ref. Range 09/18/2011 08:08 09/18/2011 11:41 09/18/2011 17:16 09/18/2011 21:23 09/18/2011 23:53 09/19/2011 05:59  Glucose-Capillary Latest Range: 70-99 mg/dL 409 (H) 811 (H) 914 (H) 263 (H) 309 (H) 247 (H)    Inpatient Diabetes Program Recommendations Insulin - Basal: Lantus 23 units QHS Correction (SSI): Novolog moderate tidwc and hs Insulin - Meal Coverage: Novolog 4 units tidwc for meal coverage insulin HgbA1C: Check HgbA1C to assess glycemic control prior to hospitalization Diet: CHO-mod med

## 2011-09-19 NOTE — Progress Notes (Signed)
[facility] Group Notes:  (Counselor/Nursing/MHT/Case Management/Adjunct)  09/19/2011 1:27 PM  Type of Therapy:  Group Therapy  Participation Level:  Active  Participation Quality:  Appropriate, Attentive and Sharing  Affect:  Appropriate  Cognitive:  Alert and Appropriate  Insight:  Good  Engagement in Group:  Good  Engagement in Therapy:  Good  Modes of Intervention:  Education, Support and Exploration  Summary of Progress/Problems: Patient reports she often experiences feelings of hurt when her family does not understand what she is going through. She stated a way she chooses to deal with her children is by "not having a relationship with them". She reported if she does not communicating with her children it will prevent her from hurting them.   Monica [name] 09/19/2011, 1:27 PM

## 2011-09-19 NOTE — Progress Notes (Addendum)
Patient ID: Monica [name], female   DOB: [birth year], 51 y.o.   MRN: [id number] Resting tonight when I made rounds, took her meds to her as she had already gotten into bed and was nearly asleep.  Was cooperative, pleasant, but refused KCl stating she thought she was getting too much, lab showed level was 3.9.  Was started on clonidine protocol, felt a little anxious, otherwise no c/o's, denies thoughts of self harm , voices.  Will continue to monitor. 0315... Had fallen asleep and had been sleeping just under 2 hours when she awakened with the arrival of roommate.  Was upset and irritable, came out to nsg station and wanted ambien.  I explained she didn't have it ordered, but that we aren't to give sleeping meds after 2 am.  She was angry but went back to bed.  When I took the loading dose of librium to her roommate, she said she also wanted librium, didn't understand why she didn't have ativan ordered like she did across the street.   04:45 ... Came out to nsg station and asked for 72 hr release form, signed it and went back to bed.  Will continue to monitor.

## 2011-09-19 NOTE — Progress Notes (Signed)
[facility] Group Notes:  (Counselor/Nursing/MHT/Case Management/Adjunct)  09/19/2011 5:39 PM  Type of Therapy:  Group Therapy  Participation Level:  None, Patient slept through group   Clide Dales 09/19/2011, 5:39 PM

## 2011-09-20 DIAGNOSIS — F319 Bipolar disorder, unspecified: Secondary | ICD-10-CM

## 2011-09-20 LAB — GLUCOSE, CAPILLARY
Glucose-Capillary: 116 mg/dL — ABNORMAL HIGH (ref 70–99)
Glucose-Capillary: 107 mg/dL — ABNORMAL HIGH (ref 70–99)
Glucose-Capillary: 212 mg/dL — ABNORMAL HIGH (ref 70–99)

## 2011-09-20 MED ORDER — HYDROXYZINE HCL 25 MG PO TABS
25.0000 mg | ORAL_TABLET | Freq: Three times a day (TID) | ORAL | Status: DC | PRN
  Administered 2011-09-20: 25 mg via ORAL

## 2011-09-20 MED ORDER — QUETIAPINE FUMARATE 100 MG PO TABS
100.0000 mg | ORAL_TABLET | Freq: Every day | ORAL | Status: DC
  Administered 2011-09-20 – 2011-09-21 (×2): 100 mg via ORAL
  Filled 2011-09-20 (×4): qty 1

## 2011-09-20 NOTE — Progress Notes (Signed)
Patient ID: Monica [name], female   DOB: 02-20-1961, 51 y.o.   MRN: [id number]   [facility] Group Notes:  (Counselor/Nursing/MHT/Case Management/Adjunct)  09/20/2011 1:15 PM  Type of Therapy:  Group Therapy, Dance/Movement Therapy   Participation Level:  Did Not Attend  Kym Groom

## 2011-09-20 NOTE — ED Provider Notes (Signed)
Medical screening examination/treatment/procedure(s) were performed by non-physician practitioner and as supervising physician I was immediately available for consultation/collaboration.  Loren Racer, MD 09/20/11 [phone]

## 2011-09-20 NOTE — Progress Notes (Signed)
Patient ID: Monica [name], female   DOB: March 15, 1961, 51 y.o.   MRN: [id number] 09/20/2011 Nursing Shanisha has had a good day...she remains sad, depressed and totally puzzled how she's  Going " to make it". She has been compliant with her meds...and has attended her groups. She, however has not eaten well today. Her CBG dropped to 50 at dinner time. She was talking and non symptomatic and was given 3 glucose tabs and ate 1 / 2 container of apple sauce ( there was no free juice anywhere on the adult unit) of orange juice. Her CBG was rechecked 15 min later and  It was 38. She  Was given 1 cup of  Yogut, 1 tube of instant glucose and 2 12 oz cups of oj. Her cbg was rechecked 15 min  Later and it was up to 53. She was taken to the dining room and put at the front of the line. Dr Allena Katz was notified and  Meal coverage ( 3 units) of novolog insulin was held, as long as sliding scale. Hwer cbg was checked about 1800 and it was 107. This relsutl was called to Dr Allena Katz by this nuirse. Pt cont asymptomatic. A POC intact and cont R Safety is [employer] PD RN [facility]

## 2011-09-20 NOTE — Progress Notes (Signed)
[facility] MD Progress Note  09/20/2011 10:43 AM  Diagnosis:   Axis I: See current hospital problem list Axis II: Deferred Axis III:  Past Medical History  Diagnosis Date  . Diabetes mellitus   . Bipolar disorder   . Depression   . Hepatitis C   . Psychotic episode    Axis IV: Unchanged Axis V: 31-40 impairment in reality testing  ADL's:  Intact  Sleep: Poor  Appetite:  Poor  Suicidal Ideation:  Plan:  None Homicidal Ideation:  Plan:  None  AEB (as evidenced by):Monica [name] asked to be seen for her anxiety, and is requesting "more meds."  She wants to resume Klonopin.  States she plans to have Dr. Betti Cruz continue it after discharge.  She is having symptoms consistent with opiate and benzo w/d.  She is under the impression she is to be discharged tomorrow (09/21/11).  She does not appear ready.  Mental Status Examination/Evaluation: Objective:  Appearance: Disheveled  Eye Contact::  Minimal  Speech:  Slurred  Volume:  Normal  Mood:  Anxious, Depressed and Irritable  Affect:  Blunt  Thought Process:  Circumstantial and Disorganized  Orientation:  Full  Thought Content:  WDL  Suicidal Thoughts:  No  Homicidal Thoughts:  No  Memory:  Remote;   Good  Judgement:  Poor  Insight:  Absent  Psychomotor Activity:  Decreased and Psychomotor Retardation  Concentration:  Good  Recall:  Good  Akathisia:  No  Handed:    AIMS (if indicated):     Assets:  Housing  Sleep:  Number of Hours: 1.75    Vital Signs:Blood pressure 121/86, pulse 78, temperature 97.4 F (36.3 C), temperature source Oral, resp. rate 20, height 5' 5.5" (1.664 m), weight 82.555 kg (182 lb). Current Medications: Current Facility-Administered Medications  Medication Dose Route Frequency Provider Last Rate Last Dose  . acetaminophen (TYLENOL) tablet 650 mg  650 mg Oral Q4H PRN Verne Spurr, PA      . alum & mag hydroxide-simeth (MAALOX/MYLANTA) 200-200-20 MG/5ML suspension 30 mL  30 mL Oral PRN Verne Spurr, PA      .  chlordiazePOXIDE (LIBRIUM) capsule 25 mg  25 mg Oral QID Verne Spurr, PA   25 mg at 09/19/11 2158   Followed by  . chlordiazePOXIDE (LIBRIUM) capsule 25 mg  25 mg Oral TID Verne Spurr, PA   25 mg at 09/20/11 4098   Followed by  . chlordiazePOXIDE (LIBRIUM) capsule 25 mg  25 mg Oral BH-qamhs Verne Spurr, PA       Followed by  . chlordiazePOXIDE (LIBRIUM) capsule 25 mg  25 mg Oral Daily Verne Spurr, [place]      . cloNIDine (CATAPRES) tablet 0.1 mg  0.1 mg Oral QID Verne Spurr, PA   0.1 mg at 09/20/11 1191   Followed by  . cloNIDine (CATAPRES) tablet 0.1 mg  0.1 mg Oral BH-qamhs Verne Spurr, PA       Followed by  . cloNIDine (CATAPRES) tablet 0.1 mg  0.1 mg Oral QAC breakfast Verne Spurr, PA      . diclofenac (VOLTAREN) EC tablet 75 mg  75 mg Oral BID Alyson Kuroski-Mazzei, DO   75 mg at 09/20/11 0814  . dicyclomine (BENTYL) tablet 20 mg  20 mg Oral Q4H PRN Verne Spurr, PA      . estrogen (conjugated)-medroxyprogesterone (PREMPRO) 0.3-1.5 MG per tablet 1 tablet  1 tablet Oral Daily Verne Spurr, PA   1 tablet at 09/20/11 0815  . furosemide (LASIX) tablet 40 mg  40 mg Oral TID Verne Spurr, PA   40 mg at 09/20/11 0815  . gabapentin (NEURONTIN) capsule 800 mg  800 mg Oral TID Verne Spurr, PA   800 mg at 09/20/11 9562  . hydrOXYzine (ATARAX/VISTARIL) tablet 25 mg  25 mg Oral TID PRN Jorje Guild, PA      . insulin aspart (novoLOG) injection 0-15 Units  0-15 Units Subcutaneous TID [facility] Verne Spurr, PA   15 Units at 09/19/11 1209  . insulin aspart (novoLOG) injection 0-5 Units  0-5 Units Subcutaneous QHS Verne Spurr, PA      . insulin aspart (novoLOG) injection 3 Units  3 Units Subcutaneous TID WC Alyson Kuroski-Mazzei, DO   3 Units at 09/20/11 1308  . insulin glargine (LANTUS) injection 23 Units  23 Units Subcutaneous QHS Verne Spurr, [place]   23 Units at 09/19/11 2200  . lamoTRIgine (LAMICTAL) tablet 100 mg  100 mg Oral BID Verne Spurr, PA   100 mg at 09/20/11 0814  . loperamide  (IMODIUM) capsule 2-4 mg  2-4 mg Oral PRN Verne Spurr, PA      . methocarbamol (ROBAXIN) tablet 500 mg  500 mg Oral Q8H PRN Verne Spurr, PA      . mulitivitamin with minerals tablet 1 tablet  1 tablet Oral Daily Verne Spurr, [place]   1 tablet at 09/20/11 0816  . nicotine (NICODERM CQ - dosed in mg/24 hours) patch 21 mg  21 mg Transdermal Daily Verne Spurr, PA   21 mg at 09/20/11 0819  . ondansetron (ZOFRAN-ODT) disintegrating tablet 4 mg  4 mg Oral Q6H PRN Verne Spurr, PA      . pantoprazole (PROTONIX) EC tablet 40 mg  40 mg Oral Q1200 Verne Spurr, PA   40 mg at 09/19/11 1209  . potassium chloride SA (K-DUR,KLOR-CON) CR tablet 40 mEq  40 mEq Oral Daily Alyson Kuroski-Mazzei, DO   40 mEq at 09/20/11 0814  . QUEtiapine (SEROQUEL) tablet 100 mg  100 mg Oral QHS Jorje Guild, PA      . thiamine (B-1) injection 100 mg  100 mg Intramuscular Once Verne Spurr, [place]      . thiamine (VITAMIN B-1) tablet 100 mg  100 mg Oral Daily Verne Spurr, PA      . ziprasidone (GEODON) capsule 80 mg  80 mg Oral BID WC Alyson Kuroski-Mazzei, DO   80 mg at 09/20/11 0815  . DISCONTD: chlordiazePOXIDE (LIBRIUM) capsule 25 mg  25 mg Oral Q6H PRN Verne Spurr, PA      . DISCONTD: hydrOXYzine (ATARAX/VISTARIL) tablet 25 mg  25 mg Oral Q6H PRN Verne Spurr, PA   25 mg at 09/19/11 0333  . DISCONTD: hydrOXYzine (ATARAX/VISTARIL) tablet 25 mg  25 mg Oral Q6H PRN Verne Spurr, PA      . DISCONTD: insulin aspart (novoLOG) injection 0-9 Units  0-9 Units Subcutaneous TID WC Alyson Kuroski-Mazzei, DO      . DISCONTD: insulin aspart (novoLOG) injection 4 Units  4 Units Subcutaneous TID WC Verne Spurr, PA      . DISCONTD: insulin lispro (HUMALOG) injection 2-8 Units  2-8 Units Subcutaneous Daily Verne Spurr, PA      . DISCONTD: potassium chloride SA (K-DUR,KLOR-CON) CR tablet 40 mEq  40 mEq Oral BID Verne Spurr, PA   40 mEq at 09/19/11 0828  . DISCONTD: QUEtiapine (SEROQUEL) tablet 75 mg  75 mg Oral QHS Alyson  Kuroski-Mazzei, DO   75 mg at 09/19/11 2158    Lab Results:  Results for orders  placed during the hospital encounter of 09/18/11 (from the past 48 hour(s))  GLUCOSE, CAPILLARY     Status: Abnormal   Collection Time   09/18/11 11:53 PM      Component Value Range Comment   Glucose-Capillary 309 (*) 70 - 99 (mg/dL)   GLUCOSE, CAPILLARY     Status: Abnormal   Collection Time   09/19/11  5:59 AM      Component Value Range Comment   Glucose-Capillary 247 (*) 70 - 99 (mg/dL)   GLUCOSE, CAPILLARY     Status: Abnormal   Collection Time   09/19/11 12:05 PM      Component Value Range Comment   Glucose-Capillary 385 (*) 70 - 99 (mg/dL)   GLUCOSE, CAPILLARY     Status: Normal   Collection Time   09/19/11  4:52 PM      Component Value Range Comment   Glucose-Capillary 77  70 - 99 (mg/dL)   GLUCOSE, CAPILLARY     Status: Abnormal   Collection Time   09/19/11  9:42 PM      Component Value Range Comment   Glucose-Capillary 109 (*) 70 - 99 (mg/dL)   GLUCOSE, CAPILLARY     Status: Abnormal   Collection Time   09/20/11  6:07 AM      Component Value Range Comment   Glucose-Capillary 116 (*) 70 - 99 (mg/dL)     Physical Findings: AIMS:  , ,  ,  ,    CIWA:  CIWA-Ar Total: 5  COWS:  COWS Total Score: 1   Treatment Plan Summary: Daily contact with patient to assess and evaluate symptoms and progress in treatment Medication management  Plan: Continue safe medical detox Start Vistaril prn, and increase Seroquel qhs Research SA treatment options  Arelia Volpe 09/20/2011, 10:43 AM

## 2011-09-20 NOTE — Progress Notes (Signed)
Patient ID: SHALVA ROZYCKI, female   DOB: Jun 13, 1961, 51 y.o.   MRN: [id number] Came to med window at beginning of shift, c/o nausea and anxiety, voiced desire for klonopin which she takes at home. Telling another pt she couldn't get it here but could when she left, somewhat sarcastic, stated she was nauseated, feeling anxious, wanted something for nerves and was given vistaril and zofran.  Returned after group, stated she was feeling better, cbg=212, lantus 23 u given,  Hs coverage of 2 units of novolog held after talking to MD, d/t earlier hypoglycemic episode. Was instructed on use of the insulin pen and was able to give her insulin, including holding it in place afterward for 10 seconds, which she said she hadn't done before when she had used the pen, but thought that may have been why she had been unsuccessful managing her levels d/t leakage. Seemed appreciative and receptive at this time.  Will continue to monitor.

## 2011-09-20 NOTE — Progress Notes (Signed)
Pt. Is in bed resting quietly with his eyes closed

## 2011-09-20 NOTE — Progress Notes (Signed)
Patient ID: Monica [name], female   DOB: 1961-05-14, 51 y.o.   MRN: [id number] Pt. attended and participated in aftercare planning group. Pt. accepted information on suicide prevention, warning signs to look for with suicide and crisis line numbers to use. The pt. agreed to call crisis line numbers if having warning signs or having thoughts of suicide. Pt. listed their current anxiety level as high.  Pt. stated that she is feeling, "closed in'.  Pt. accepted information on NA meeting groups.

## 2011-09-21 DIAGNOSIS — F1120 Opioid dependence, uncomplicated: Principal | ICD-10-CM

## 2011-09-21 DIAGNOSIS — F1310 Sedative, hypnotic or anxiolytic abuse, uncomplicated: Secondary | ICD-10-CM

## 2011-09-21 LAB — GLUCOSE, CAPILLARY
Glucose-Capillary: 116 mg/dL — ABNORMAL HIGH (ref 70–99)
Glucose-Capillary: 166 mg/dL — ABNORMAL HIGH (ref 70–99)
Glucose-Capillary: 219 mg/dL — ABNORMAL HIGH (ref 70–99)

## 2011-09-21 MED ORDER — QUETIAPINE FUMARATE 100 MG PO TABS: 100.0000 mg | ORAL_TABLET | Freq: Every day | ORAL | Status: DC

## 2011-09-21 MED ORDER — CLONIDINE HCL 0.1 MG PO TABS: 0.1000 mg | ORAL_TABLET | Freq: Every day | ORAL | Status: DC

## 2011-09-21 MED ORDER — CHLORDIAZEPOXIDE HCL 25 MG PO CAPS: 25.0000 mg | ORAL_CAPSULE | Freq: Every day | ORAL | Status: AC

## 2011-09-21 MED ORDER — CLONIDINE HCL 0.1 MG PO TABS: 0.1000 mg | ORAL_TABLET | ORAL | Status: DC

## 2011-09-21 NOTE — Progress Notes (Signed)
Patient ID: Monica [name], female   DOB: [birth year], 51 y.o.   MRN: [id number]  [facility] Group Notes:  (Counselor/Nursing/MHT/Case Management/Adjunct)  09/21/2011 1:15 PM  Type of Therapy:  Group Therapy, Dance/Movement Therapy   Participation Level:  Minimal  Participation Quality:  Drowsy  Affect:  Appropriate  Cognitive:  Appropriate  Insight:  Limited  Engagement in Group:  Limited  Engagement in Therapy:  Limited  Modes of Intervention:  Clarification, Problem-solving, Role-play, Socialization and Support  Summary of Progress/Problems:  Therapist discussed with group what happens when a plan doesn't go as expected and  ways to develop positive coping mechanisms when support systems have been exhausted.  Pt. stated that she will " remain focused ".        Rhunette Croft

## 2011-09-21 NOTE — [facility] Suicide Risk Assessment (Signed)
Suicide Risk Assessment  Discharge Assessment     Demographic factors:  Assessment Details Time of Assessment: Admission Information Obtained From: Patient Current Mental Status:  Current Mental Status:  (Denies) Risk Reduction Factors:  Risk Reduction Factors: Responsible for children under 50 years of age;Sense of responsibility to family;Living with another person, especially a relative;Positive social support  CLINICAL FACTORS:   Severe Anxiety and/or Agitation Alcohol/Substance Abuse/Dependencies More than one psychiatric diagnosis Previous Psychiatric Diagnoses and Treatments  COGNITIVE FEATURES THAT CONTRIBUTE TO RISK:  Polarized thinking    COGNITIVE FEATURES THAT CONTRIBUTE TO RISK:  None Noted.   Diagnosis:  Axis I: Opioid Dependence. Benzodiazepine Abuse.   The patient was seen today and reports the following:   ADL's: Intact.  Sleep: The patient reports to sleeping well last night.  Appetite: The patient reports a decreased appetite.   Mild>(1-10) >Severe  Hopelessness (1-10): 0  Depression (1-10): 0  Anxiety (1-10): 4   Suicidal Ideation: The patient adamantly denies any suicidal ideations today.  Plan: No  Intent: No  Means: No   Homicidal Ideation: The patient adamantly denies any homicidal ideations.  Plan: No  Intent: No.  Means: No   General Appearance /Behavior: Casual and cooperative.  Eye Contact: Good.  Speech: Appropriate in rate and volume with no pressuring noted.  Motor Behavior: Appropriate.  Level of Consciousness: Alert and Oriented x 3.  Mental Status: Alert and Oriented x 3.  Mood: Mildly Depressed in appearance.  Affect: Mildly Constricted.  Anxiety Level: Mild to Moderate Anxiety reported.  Thought Process: wnl today.  Thought Content: The patient denies any auditory or visual hallucinations or delusional thinking today.  Perception:. wnl.  Judgment: Fair.  Insight: Fair.  Cognition: Oriented to time, place and person.    Time was spent today discussing with the patient her plans for discharge.  The patient states that she signed her 72 hour request for discharge and that the time is up in the morning.  She reports to wanting discharge at that time.  She denies any suicidal or homicidal ideations nor any symptoms of substance withdrawal. The patient is aware that she has one dose of Librium and Clonidine to take tomorrow.  Treatment Plan Summary:  1. Daily contact with patient to assess and evaluate symptoms and progress in treatment  2. Medication management  3. The patient will deny suicidal ideations or homicidal ideations for 48 hours prior to discharge and have a depression and anxiety rating of 3 or less. The patient will also deny any auditory or visual hallucinations or delusional thinking.  4. The patient will deny any symptoms of substance withdrawal at time of discharge.  Plan:  1. Will continue current medications. 2. Will continue to monitor. 3. Discharge early in the morning at the patient's request.  The patient signed her 72 hour notification of discharge and there is no grounds for IVC.  SUICIDE RISK:   Minimal: No identifiable suicidal ideation.  Patients presenting with no risk factors but with morbid ruminations; may be classified as minimal risk based on the severity of the depressive symptoms  Monica [name] 09/21/2011, 5:30 PM

## 2011-09-21 NOTE — Progress Notes (Signed)
Patient ID: Monica [name], female   DOB: [birth year], 51 y.o.   MRN: [id number] 09/21/2011 Nursing   1500 D Selena states she is preparing to be DC'd tomorrow morning. SHe completed her self inventory sheet and on it she worte she denied SI, she stated her DC plans include exercise, cont with meetings, physical therapy and be honest. A She has been compliant with her meds as well as attending her groups. She demonstrates poor insight into the etiology of her drug abuse, she looks to others to validate her to increase her poor self esteem and she is not vested in her sobriety and/ or recovery. She wants immediate " fix its ' and admits she cannot tolerate discomfort. R Md has completed suicide risk assessment, written DC me3ds, DC scripts are on her chart and POC  arrnaged around pt's request to be DC'd tomorrow AM at 0630. PD RN [facility]

## 2011-09-21 NOTE — Progress Notes (Signed)
Pt is requesting D/C Monday 09/22/11. She has current meeting schedule and has verbally committed to attending meetings. Pt denies S/I and H/I.

## 2011-09-22 LAB — GLUCOSE, CAPILLARY: Glucose-Capillary: 131 mg/dL — ABNORMAL HIGH (ref 70–99)

## 2011-09-22 NOTE — Progress Notes (Signed)
Patient ID: Monica [name], female   DOB: Aug 22, 1960, 51 y.o.   MRN: [id number] Came to med window tonight, talking about leaving in am, planning to go skating with her daughter, seems happy to be leaving.  Denies w/d sx at this time, stated she had klonopin at home to help deal with anxiety. Asking appropriate questions about insulin, was able to self administer lantus tonight with pen.  Talked about not skipping a meal, when to check cbg.  States has a dietary consult scheduled thru pcp's office for next month.  Not sure when will actually leave before breakfast tomorrow, seems a little uncertain about times. Will wake early so she can be ready for d/c.  Will continue to monitor.

## 2011-09-22 NOTE — Progress Notes (Signed)
Patient ID: Monica [name], female   DOB: 1961/06/03, 51 y.o.   MRN: [id number] Pt is being discharged to boy friend, daughter.  Is ambulatory, denies SI/HI/AVH.  CBG this am was 54, was given snacks, CBG recheck was 131.  Belongings were retrieved from locker and returned to pt, also medications and insulin pens, no coverage given prior to discharge. Discharge instructions were reviewed, pt voiced understanding for follow-up, signed.  No c/o's voiced at this time.

## 2011-09-26 NOTE — Progress Notes (Signed)
Patient Discharge Instructions:   No follow up scheduled.  Wandra Scot, 09/26/2011, 4:05 PM

## 2011-10-06 NOTE — Discharge Summary (Signed)
Physician Discharge Summary Note  Patient:  Monica [name] is an 51 y.o., female MRN:  [id number] DOB:  [birth year] Patient phone:  [phone] (home)  Patient address:   [address] [place] [address],   Date of Admission:  09/18/2011 Date of Discharge: 09/22/2011  Reason for Admission: Heroine detox  Discharge Diagnoses: Principal Problem:  *Opioid dependence Active Problems:  Benzodiazepine abuse Axis Diagnosis:   AXIS I:  Opioid dependence, Bipolar affective disorder, polysubstance abuse partial remission. AXIS II:  Borderline personality disorder AXIS III:   Past Medical History  Diagnosis Date  . Diabetes mellitus   . Bipolar disorder   . Depression   . Hepatitis C   . Psychotic episode    AXIS IV: problems with primary support group  AXIS V:  GAF: 65  Level of Care: outpatient  Hospital Course:  Monica [name] had a brief hospital stay and requested discharge shortly after she was admitted. She demonstrated poor tolerance for delayed gratification, was intrusive with the staff and was demanding of providers time above other patients.  Despite consistent psycho-education regarding the use of medication, Monica [name] did not agree or feel comfortable with her initial decision to pursue detox and requested discharge. She stated that her withdrawal symptoms were done and she requested to leave without completing her planned taper of clonidine.  She was educated regarding the risks and benefits of early discharge and elected to be discharged home.  Consults: none  Significant Diagnostic Studies:  none  Discharge Vitals:   Blood pressure 111/81, pulse 84, temperature 97.7 F (36.5 C), temperature source Oral, resp. rate 15, height 5' 5.5" (1.664 m), weight 82.555 kg (182 lb), SpO2 100.00%.  Mental Status Exam: See Mental Status Examination and Suicide Risk Assessment completed by Attending Physician prior to discharge.  Discharge destination:  Home  Is patient on  multiple antipsychotic therapies at discharge:  No.   Has Patient had three or more failed trials of antipsychotic monotherapy by history:  No.  Recommended Plan for Multiple Antipsychotic Therapies: Not applicable.   Medication List  As of 10/06/2011 10:16 PM   TAKE these medications      Indication                    diclofenac 75 MG EC tablet   Commonly known as: VOLTAREN   Take 75 mg by mouth 2 (two) times daily.       estrogen (conjugated)-medroxyprogesterone 0.3-1.5 MG per tablet   Commonly known as: PREMPRO   Take 1 tablet by mouth daily.       furosemide 40 MG tablet   Commonly known as: LASIX   Take 40 mg by mouth 3 (three) times daily.       gabapentin 800 MG tablet   Commonly known as: NEURONTIN   Take 800 mg by mouth 3 (three) times daily.       insulin glargine 100 UNIT/ML injection   Commonly known as: LANTUS   Inject 23 Units into the skin at bedtime.       insulin lispro 100 UNIT/ML injection   Commonly known as: HUMALOG   Inject 2-8 Units into the skin daily. PT USES SLIDING SCALE       lamoTRIgine 100 MG tablet   Commonly known as: LAMICTAL   Take 100 mg by mouth 2 (two) times daily.       omeprazole 20 MG capsule   Commonly known as: PRILOSEC   Take 20 mg by mouth  every morning.       potassium chloride SA 20 MEQ tablet   Commonly known as: K-DUR,KLOR-CON   Take 40 mEq by mouth 2 (two) times daily.       QUEtiapine 100 MG tablet   Commonly known as: SEROQUEL   Take 1 tablet (100 mg total) by mouth at bedtime.                         Follow-up Information    Follow up with NA meetings. (see currrent schedule for meetings)         Follow-up recommendations:  Follow up with PCP for medication and patient was strongly encouraged to return to [facility] if she felt unsafe or unwell.  Comments:  Worrisome for increased risk of relapse due to minimal participation in detox program.  Signed: [name] 10/06/2011, 10:16 PM

## 2012-03-22 ENCOUNTER — Telehealth (HOSPITAL_COMMUNITY): Payer: Self-pay | Admitting: *Deleted

## 2012-03-22 NOTE — ED Notes (Signed)
Pt. called on VM @ 1119 and asked for number of the clinic inside the hospital.  States [facility] was closing and she told to call there.  I called pt. back but mailbox was full.  I called again and she said she already got the number she was looking for. I asked her if it was the Outpatient clinic @ [phone].  She said she did not know, it was in her phone. She took the number just in case it was not the same. Monica [name] 03/22/2012

## 2012-06-01 ENCOUNTER — Ambulatory Visit: Payer: Self-pay | Admitting: Family Medicine

## 2012-06-10 ENCOUNTER — Ambulatory Visit (INDEPENDENT_AMBULATORY_CARE_PROVIDER_SITE_OTHER): Payer: Medicaid Other | Admitting: Family Medicine

## 2012-06-10 ENCOUNTER — Encounter: Payer: Self-pay | Admitting: Family Medicine

## 2012-06-10 VITALS — BP 104/70 | HR 59 | Ht 65.0 in | Wt 170.0 lb

## 2012-06-10 DIAGNOSIS — F1910 Other psychoactive substance abuse, uncomplicated: Secondary | ICD-10-CM

## 2012-06-10 DIAGNOSIS — Z23 Encounter for immunization: Secondary | ICD-10-CM

## 2012-06-10 DIAGNOSIS — E785 Hyperlipidemia, unspecified: Secondary | ICD-10-CM

## 2012-06-10 DIAGNOSIS — E161 Other hypoglycemia: Secondary | ICD-10-CM

## 2012-06-10 DIAGNOSIS — N259 Disorder resulting from impaired renal tubular function, unspecified: Secondary | ICD-10-CM

## 2012-06-10 DIAGNOSIS — K5900 Constipation, unspecified: Secondary | ICD-10-CM

## 2012-06-10 DIAGNOSIS — F319 Bipolar disorder, unspecified: Secondary | ICD-10-CM

## 2012-06-10 DIAGNOSIS — E160 Drug-induced hypoglycemia without coma: Secondary | ICD-10-CM

## 2012-06-10 DIAGNOSIS — E669 Obesity, unspecified: Secondary | ICD-10-CM

## 2012-06-10 DIAGNOSIS — E109 Type 1 diabetes mellitus without complications: Secondary | ICD-10-CM

## 2012-06-10 DIAGNOSIS — K219 Gastro-esophageal reflux disease without esophagitis: Secondary | ICD-10-CM

## 2012-06-10 LAB — GLUCOSE, CAPILLARY: Glucose-Capillary: 82 mg/dL (ref 70–99)

## 2012-06-10 LAB — POCT GLYCOSYLATED HEMOGLOBIN (HGB A1C): Hemoglobin A1C: 6.7

## 2012-06-10 MED ORDER — INSULIN GLARGINE 100 UNIT/ML ~~LOC~~ SOLN: 25.0000 [IU] | Freq: Every day | SUBCUTANEOUS | Status: DC

## 2012-06-10 MED ORDER — INSULIN LISPRO 100 UNIT/ML ~~LOC~~ SOLN: 2.0000 [IU] | Freq: Every day | SUBCUTANEOUS | Status: DC

## 2012-06-10 MED ORDER — OMEPRAZOLE 20 MG PO CPDR: 20.0000 mg | DELAYED_RELEASE_CAPSULE | ORAL | Status: DC

## 2012-06-10 MED ORDER — GLUCOSE BLOOD VI STRP: ORAL_STRIP | Status: DC

## 2012-06-10 MED ORDER — POLYETHYLENE GLYCOL 3350 17 G PO PACK: 17.0000 g | PACK | Freq: Every day | ORAL | Status: DC

## 2012-06-10 NOTE — Assessment & Plan Note (Signed)
Well controlled DM. Continue current regimen of Lantus 25 units QHS and sliding scale humalog. Recheck in 3 months.   Lab Results  Component Value Date   HGBA1C 6.7 06/10/2012

## 2012-06-10 NOTE — Assessment & Plan Note (Signed)
Patient had a CBG of 37 upon testing in the lab. She was given 8 ounces of coca-cola. Also she was given 30 grams of glucose in a beverage. Her CBG climbed to 88. Therefore, she was appropriate to leave the office. She will call if the problem recurs.

## 2012-06-10 NOTE — Assessment & Plan Note (Signed)
Body mass index is 28.29 kg/(m^2).  Currently exercises daily. Continue exercise pattern.

## 2012-06-10 NOTE — Progress Notes (Signed)
  Subjective:    Patient ID: Monica [name], female    DOB: 1960/10/16, 51 y.o.   MRN: [id number]  HPI  51 year old F who presents for a new patient visit.   Diabetes  Taking and tolerating: insulin Lantus 25 units QHS and insulin humolog sliding scale  Fasting blood sugars: 300  Hypoglycemic symptoms: no  Diet Changes: eating before bed otherwise eating lots of sugar Exercise: walking & weights - daily  Last eye exam: years ago Visual problems: yes Monitoring feet: yes - denies sores or ulcers Numbness/Tingling: yes, has dx of neuropathy Diabetic Labs:  Lab Results  Component Value Date   HGBA1C 7.4* 02/15/2011   HGBA1C 6.5 06/20/2010   HGBA1C 7.1 03/22/2010   Lab Results  Component Value Date   MICROALBUR 0.50 10/11/2009   LDLCALC 192* 09/18/2009   CREATININE 1.45* 09/17/2011   Last microalbumin: Lab Results  Component Value Date   MICROALBUR 0.50 10/11/2009   Taking an ACE-I ?: no Has renal doctor - Briant Cedar at [facility]    Goals for Health: 1. Improved Blood Sugar Control 2. Weight Loss   PMH: never had a mammogram, last pap smear in 2003; otherwise, problems updated in problem list  Social: lives with mother  Medications: Entire med list reviewed and noted in chart  Review of Systems Positive fatigue, concern for elevated blood sugar     Objective:   Physical Exam BP 104/70  Pulse 59  Ht 5\' 5"  (1.651 m)  Wt 170 lb (77.111 kg)  BMI 28.29 kg/m2 CBG 37 Gen: middle age WF, non ill appearing, very fatigued with flat affect CV: RRR, no murmurs Pulm: CTA-B Extr: normal foot exam w/o ulcers; sensation intact Neuro: no tremulousness Skin: pale but no diaphoresis      Assessment & Plan:  Patient presents for a new patient visit and diabetes evaluation and is found to have profound hypoglycemia to 37.

## 2012-06-14 ENCOUNTER — Encounter: Payer: Self-pay | Admitting: Family Medicine

## 2012-07-23 ENCOUNTER — Other Ambulatory Visit: Payer: Self-pay | Admitting: Family Medicine

## 2012-07-23 NOTE — Telephone Encounter (Signed)
Patient is calling to let Dr. Clinton Sawyer know that she will need these medications by 12/24.

## 2012-08-11 ENCOUNTER — Encounter (HOSPITAL_COMMUNITY): Payer: Self-pay | Admitting: Emergency Medicine

## 2012-08-11 ENCOUNTER — Emergency Department (HOSPITAL_COMMUNITY)
Admission: EM | Admit: 2012-08-11 | Discharge: 2012-08-11 | Disposition: A | Discharge status: Home / Self Care | Payer: Medicaid Other | Attending: Emergency Medicine | Admitting: Emergency Medicine

## 2012-08-11 DIAGNOSIS — Z79899 Other long term (current) drug therapy: Secondary | ICD-10-CM | POA: Insufficient documentation

## 2012-08-11 DIAGNOSIS — Y929 Unspecified place or not applicable: Secondary | ICD-10-CM | POA: Insufficient documentation

## 2012-08-11 DIAGNOSIS — F17200 Nicotine dependence, unspecified, uncomplicated: Secondary | ICD-10-CM | POA: Insufficient documentation

## 2012-08-11 DIAGNOSIS — F329 Major depressive disorder, single episode, unspecified: Secondary | ICD-10-CM | POA: Insufficient documentation

## 2012-08-11 DIAGNOSIS — F3289 Other specified depressive episodes: Secondary | ICD-10-CM | POA: Insufficient documentation

## 2012-08-11 DIAGNOSIS — E119 Type 2 diabetes mellitus without complications: Secondary | ICD-10-CM | POA: Insufficient documentation

## 2012-08-11 DIAGNOSIS — S0500XA Injury of conjunctiva and corneal abrasion without foreign body, unspecified eye, initial encounter: Secondary | ICD-10-CM

## 2012-08-11 DIAGNOSIS — S058X9A Other injuries of unspecified eye and orbit, initial encounter: Secondary | ICD-10-CM | POA: Insufficient documentation

## 2012-08-11 DIAGNOSIS — Z794 Long term (current) use of insulin: Secondary | ICD-10-CM | POA: Insufficient documentation

## 2012-08-11 DIAGNOSIS — Y939 Activity, unspecified: Secondary | ICD-10-CM | POA: Insufficient documentation

## 2012-08-11 DIAGNOSIS — Z8619 Personal history of other infectious and parasitic diseases: Secondary | ICD-10-CM | POA: Insufficient documentation

## 2012-08-11 DIAGNOSIS — X58XXXA Exposure to other specified factors, initial encounter: Secondary | ICD-10-CM | POA: Insufficient documentation

## 2012-08-11 DIAGNOSIS — F319 Bipolar disorder, unspecified: Secondary | ICD-10-CM | POA: Insufficient documentation

## 2012-08-11 MED ORDER — CYCLOPENTOLATE HCL 1 % OP SOLN
2.0000 [drp] | Freq: Once | OPHTHALMIC | Status: AC
  Administered 2012-08-11: 2 [drp] via OPHTHALMIC
  Filled 2012-08-11: qty 2

## 2012-08-11 MED ORDER — PROPARACAINE HCL 0.5 % OP SOLN
2.0000 [drp] | Freq: Once | OPHTHALMIC | Status: DC
  Filled 2012-08-11: qty 15

## 2012-08-11 MED ORDER — KETOROLAC TROMETHAMINE 0.5 % OP SOLN: 1.0000 [drp] | Freq: Four times a day (QID) | OPHTHALMIC | Status: DC

## 2012-08-11 MED ORDER — FLUORESCEIN-BENOXINATE 0.25-0.4 % OP SOLN
1.0000 [drp] | Freq: Once | OPHTHALMIC | Status: DC
  Filled 2012-08-11: qty 5

## 2012-08-11 MED ORDER — FLUORESCEIN SODIUM 1 MG OP STRP
ORAL_STRIP | OPHTHALMIC | Status: AC
  Filled 2012-08-11: qty 1

## 2012-08-11 MED ORDER — POLYMYXIN B-TRIMETHOPRIM 10000-0.1 UNIT/ML-% OP SOLN: 1.0000 [drp] | OPHTHALMIC | Status: DC

## 2012-08-11 MED ORDER — HOMATROPINE HBR 2 % OP SOLN
2.0000 [drp] | Freq: Once | OPHTHALMIC | Status: DC
  Filled 2012-08-11: qty 5

## 2012-08-11 NOTE — ED Notes (Addendum)
Pt c/o right eye pain, She states it feels like there is something in it or it has been scratched.  Denies trauma

## 2012-08-11 NOTE — ED Provider Notes (Signed)
History     CSN: [id number]  Arrival date & time 08/11/12  7829   First MD Initiated Contact with Patient 08/11/12 0825      No chief complaint on file.   (Consider location/radiation/quality/duration/timing/severity/associated sxs/prior treatment) Patient is a 52 y.o. female presenting with eye pain. The history is provided by the patient.  Eye Pain This is a new problem. The current episode started 1 to 2 hours ago. The problem occurs constantly. The problem has not changed since onset.She has tried nothing for the symptoms.    Past Medical History  Diagnosis Date  . Diabetes mellitus   . Bipolar disorder   . Depression   . Hepatitis C   . Psychotic episode   . CORNEAL ABRASION, RIGHT 08/20/2010    Qualifier: Diagnosis of  By: Daphine Deutscher FNP, Zena Amos      Past Surgical History  Procedure Date  . Cesarean section     No family history on file.  History  Substance Use Topics  . Smoking status: Current Every Day Smoker -- 1.0 packs/day    Types: Cigarettes  . Smokeless tobacco: Never Used  . Alcohol Use: No    OB History    Grav Para Term Preterm Abortions TAB SAB Ect Mult Living                  Review of Systems  Eyes: Positive for pain.    Allergies  Review of patient's allergies indicates no known allergies.  Home Medications   Current Outpatient Rx  Name  Route  Sig  Dispense  Refill  . ACETAMINOPHEN 500 MG PO TABS   Oral   Take 500-1,000 mg by mouth every 6 (six) hours as needed. PAIN PT TAKES 2 TABS FOR 1000MG  DOSE         . BUPRENORPHINE HCL-NALOXONE HCL 8-2 MG SL SUBL   Sublingual   Place 1 tablet under the tongue daily.         Marland Kitchen CLONAZEPAM 1 MG PO TABS   Oral   Take 1 mg by mouth 3 (three) times daily as needed.         Marland Kitchen DICLOFENAC SODIUM 75 MG PO TBEC      TAKE 1 TABLET TWICE A DAY   60 tablet   3   . CONJ ESTROG-MEDROXYPROGEST ACE 0.3-1.5 MG PO TABS   Oral   Take 1 tablet by mouth daily.         . FUROSEMIDE 40 MG PO  TABS   Oral   Take 40 mg by mouth 3 (three) times daily.         Marland Kitchen GABAPENTIN 800 MG PO TABS      TAKE 1 TABLET 3 TIMES A DAY   90 tablet   2   . GLUCOSE BLOOD VI STRP      Use as instructed   100 each   12   . HYDROXYZINE PAMOATE 100 MG PO CAPS   Oral   Take 100 mg by mouth 3 (three) times daily as needed.         . INSULIN GLARGINE 100 UNIT/ML [place] SOLN   Subcutaneous   Inject 25 Units into the skin at bedtime.   10 mL   5   . INSULIN LISPRO (HUMAN) 100 UNIT/ML [place] SOLN   Subcutaneous   Inject 2-8 Units into the skin daily. PT USES SLIDING SCALE   10 mL   3   . LAMOTRIGINE 100 MG PO  TABS   Oral   Take 100 mg by mouth 2 (two) times daily.         Marland Kitchen OMEPRAZOLE 20 MG PO CPDR   Oral   Take 1 capsule (20 mg total) by mouth every morning.   30 capsule   5   . POLYETHYLENE GLYCOL 3350 PO PACK   Oral   Take 17 g by mouth daily.   14 each   2   . POTASSIUM CHLORIDE CRYS ER 20 MEQ PO TBCR   Oral   Take 40 mEq by mouth 2 (two) times daily.         Marland Kitchen ZIPRASIDONE HCL 80 MG PO CAPS   Oral   Take 80 mg by mouth 2 (two) times daily with a meal.           There were no vitals taken for this visit.  Physical Exam  Constitutional: She appears distressed.  Eyes: Conjunctivae normal, EOM and lids are normal. Pupils are equal, round, and reactive to light. No foreign bodies found. Right eye exhibits no discharge, no exudate and no hordeolum. No foreign body present in the right eye.      ED Course  Procedures (including critical care time)  Labs Reviewed - No data to display No results found.   1. Corneal abrasion       MDM  [place] lamp exam with fluorescein reveals a superficial abrasion at 6 to 7:00 on the cornea, peripherally. No foreign body noted. Patient's high dilated to the ER and will be treated with Polytrim drops and Acular. Followup as needed.        Gilda Crease, MD 08/11/12 [phone]

## 2012-08-24 ENCOUNTER — Telehealth: Payer: Self-pay | Admitting: Family Medicine

## 2012-08-24 DIAGNOSIS — N189 Chronic kidney disease, unspecified: Secondary | ICD-10-CM

## 2012-08-24 MED ORDER — FUROSEMIDE 40 MG PO TABS: 40.0000 mg | ORAL_TABLET | Freq: Three times a day (TID) | ORAL | Status: DC

## 2012-08-24 NOTE — Telephone Encounter (Signed)
Rx sent. Only seen the patient once and don't know reason for her med.

## 2012-08-24 NOTE — Telephone Encounter (Signed)
Message sent to Dr. Clinton Sawyer.  Text paged

## 2012-08-24 NOTE — Telephone Encounter (Signed)
Pt is now out of her Lasix and needs enough until her appt on 2/5 - she ran out today...  [facility] [address]

## 2012-09-08 ENCOUNTER — Ambulatory Visit (INDEPENDENT_AMBULATORY_CARE_PROVIDER_SITE_OTHER): Payer: Self-pay | Admitting: Family Medicine

## 2012-09-08 ENCOUNTER — Encounter: Payer: Self-pay | Admitting: Family Medicine

## 2012-09-08 VITALS — BP 95/63 | HR 76 | Ht 65.0 in | Wt 173.0 lb

## 2012-09-08 DIAGNOSIS — M25569 Pain in unspecified knee: Secondary | ICD-10-CM

## 2012-09-08 DIAGNOSIS — R609 Edema, unspecified: Secondary | ICD-10-CM

## 2012-09-08 DIAGNOSIS — E119 Type 2 diabetes mellitus without complications: Secondary | ICD-10-CM

## 2012-09-08 DIAGNOSIS — M25562 Pain in left knee: Secondary | ICD-10-CM

## 2012-09-08 LAB — BASIC METABOLIC PANEL
Chloride: 103 mEq/L (ref 96–112)
Glucose, Bld: 144 mg/dL — ABNORMAL HIGH (ref 70–99)
Potassium: 4.3 mEq/L (ref 3.5–5.3)
Sodium: 141 mEq/L (ref 135–145)

## 2012-09-08 NOTE — Patient Instructions (Addendum)
Dear Monica [name],   Thank you for coming to clinic today. Please read below regarding the issues that we discussed.   1. Swelling - Please continue the Lasix as needed. I will check your potassium today. Also, I will refill your Lasix when I have the results of the blood tests back.   2. Knee Pain - Please set up an appointment with Sports Medicine. Also, have your X-rays made a [place] imagining before you go to this appointment. For pain relief, please use ice and aspercream. DO NOT USE IBUPROFEN OR MOTRIN. You may use tylenol as needed. Also, please perform the thigh strengthening exercises 3 times a day.   Please follow up in clinic in 2 weeks to address the diabetes. Please call earlier if you have any questions or concerns.   Sincerely,   Dr. Clinton Sawyer

## 2012-09-08 NOTE — Progress Notes (Signed)
  Subjective:    Patient ID: Monica [name], female    DOB: 10/15/1960, 52 y.o.   MRN: [id number]  HPI  52 year old F with Bipolar Type 1, DM Type 1, and CKD III who presents for routine follow up.    Knee Pain - Anterior, bilateral, present for several months, exacerbated by walking on the treadmill, resistant to voltaren gel and PO diclofenac; no history of trauma or surgery to knees; Recent 20 lb weight gain  General Edema - Patient takes Lasix 40 mg TID and is asking for a refill, says it's for generalized swelling but doesn't know why she has swelling. It has been prescribed by previous PCP's at [facility] Has stage III CKD managed by Dr. Briant Cedar, but she says that he does not prescribe it and took he took her off potassium 6 months ago; Has visit with Dr. Briant Cedar at end of this month; Denies known cirrhosis (does have Hep C) and denies CHF (last echol 2010 showed EF 65-70, no regional wall motion abnormalities). Patient states that if she comes off Lasix her lower and upper extremities, and face will swell   Review of Systems Negative unless stated above    Objective:   Physical Exam  BP 95/63  Pulse 76  Ht 5\' 5"  (1.651 m)  Wt 173 lb (78.472 kg)  BMI 28.79 kg/m2 Gen: middle aged WF, noticeable weight gain since previous visit, non distressed Knees:   Right: mild anterior edema, mild crepitus, positive patellar compression, negative lachman's, negative grind test, posterior compartment without mass or swelling  Left: marked pre-patellar edema with fluctuant mass likely a bursa, positive patellar compression, mild crepitus, negative lachman's, negative grind test, posterior compartment without mass or swelling Extremities: 1+ pitting edema of LE bilaterally, no edema of upper extremities bilaterally  Psych: very flat affect, normal thought content, slowed speech pattern, no tangential thinking or flight of ideas     Assessment & Plan:

## 2012-09-09 ENCOUNTER — Encounter: Payer: Self-pay | Admitting: Family Medicine

## 2012-09-09 DIAGNOSIS — M25561 Pain in right knee: Secondary | ICD-10-CM | POA: Insufficient documentation

## 2012-09-09 NOTE — Assessment & Plan Note (Signed)
Unclear as to why patient would have such marked edema without cirrhosis, CHF, venous stasis, lymphedema or other etiology. CKDIII could cause retention, but not necessarily warranting high doses of daily Lasix. Given that it has been a chronic medication for her, I will continue it at this point. I will certainly check her potassium. I am also asking for clinic notes from Dr. Briant Cedar, nephrologist, who can hopefully shed some light.

## 2012-09-09 NOTE — Assessment & Plan Note (Signed)
Patient with right knee pain worse than left that has a component of anterior knee pain with possible intra-articular pain. The anterior knee pain could be from multiple etiologies including OCD vs chondromalacia vs plica vs tendonitis. For pain: Aspercreme and Ice, avoid NSAIDS 2/2 kidney disease For rehab: She was demonstrated quad strengthening exercises by Dr. Jennette Kettle, will continue those TID For further eval: standing AP bilat X-ray, referral to sports med for evaluation of left sided pre-patellar bursa

## 2012-09-23 ENCOUNTER — Ambulatory Visit: Payer: Self-pay | Admitting: Sports Medicine

## 2012-09-28 ENCOUNTER — Other Ambulatory Visit: Payer: Self-pay | Admitting: Family Medicine

## 2012-09-28 DIAGNOSIS — R609 Edema, unspecified: Secondary | ICD-10-CM

## 2012-09-28 DIAGNOSIS — N189 Chronic kidney disease, unspecified: Secondary | ICD-10-CM

## 2012-09-28 MED ORDER — FUROSEMIDE 40 MG PO TABS: 40.0000 mg | ORAL_TABLET | Freq: Two times a day (BID) | ORAL | Status: AC

## 2012-10-30 ENCOUNTER — Emergency Department (HOSPITAL_COMMUNITY)
Admission: EM | Admit: 2012-10-30 | Discharge: 2012-10-30 | Discharge status: Against Medical Advice | Payer: Medicaid Other | Attending: Emergency Medicine | Admitting: Emergency Medicine

## 2012-10-30 ENCOUNTER — Encounter (HOSPITAL_COMMUNITY): Payer: Self-pay | Admitting: Emergency Medicine

## 2012-10-30 DIAGNOSIS — F17200 Nicotine dependence, unspecified, uncomplicated: Secondary | ICD-10-CM | POA: Insufficient documentation

## 2012-10-30 DIAGNOSIS — B1710 Acute hepatitis C without hepatic coma: Secondary | ICD-10-CM

## 2012-10-30 DIAGNOSIS — F319 Bipolar disorder, unspecified: Secondary | ICD-10-CM | POA: Insufficient documentation

## 2012-10-30 DIAGNOSIS — F1120 Opioid dependence, uncomplicated: Secondary | ICD-10-CM

## 2012-10-30 DIAGNOSIS — Z8659 Personal history of other mental and behavioral disorders: Secondary | ICD-10-CM | POA: Insufficient documentation

## 2012-10-30 DIAGNOSIS — R4182 Altered mental status, unspecified: Secondary | ICD-10-CM | POA: Insufficient documentation

## 2012-10-30 DIAGNOSIS — Z79899 Other long term (current) drug therapy: Secondary | ICD-10-CM | POA: Insufficient documentation

## 2012-10-30 DIAGNOSIS — E109 Type 1 diabetes mellitus without complications: Secondary | ICD-10-CM

## 2012-10-30 DIAGNOSIS — F1310 Sedative, hypnotic or anxiolytic abuse, uncomplicated: Secondary | ICD-10-CM

## 2012-10-30 DIAGNOSIS — Z794 Long term (current) use of insulin: Secondary | ICD-10-CM | POA: Insufficient documentation

## 2012-10-30 DIAGNOSIS — F1910 Other psychoactive substance abuse, uncomplicated: Secondary | ICD-10-CM | POA: Insufficient documentation

## 2012-10-30 DIAGNOSIS — E1169 Type 2 diabetes mellitus with other specified complication: Secondary | ICD-10-CM | POA: Insufficient documentation

## 2012-10-30 DIAGNOSIS — Z87828 Personal history of other (healed) physical injury and trauma: Secondary | ICD-10-CM | POA: Insufficient documentation

## 2012-10-30 DIAGNOSIS — E11649 Type 2 diabetes mellitus with hypoglycemia without coma: Secondary | ICD-10-CM

## 2012-10-30 DIAGNOSIS — B1920 Unspecified viral hepatitis C without hepatic coma: Secondary | ICD-10-CM | POA: Insufficient documentation

## 2012-10-30 LAB — GLUCOSE, CAPILLARY
Glucose-Capillary: 101 mg/dL — ABNORMAL HIGH (ref 70–99)
Glucose-Capillary: 103 mg/dL — ABNORMAL HIGH (ref 70–99)
Glucose-Capillary: 119 mg/dL — ABNORMAL HIGH (ref 70–99)

## 2012-10-30 LAB — URINALYSIS, ROUTINE W REFLEX MICROSCOPIC
Bilirubin Urine: NEGATIVE
Glucose, UA: NEGATIVE mg/dL
Leukocytes, UA: NEGATIVE
Nitrite: NEGATIVE
Specific Gravity, Urine: 1.019 (ref 1.005–1.030)
pH: 5.5 (ref 5.0–8.0)

## 2012-10-30 LAB — RAPID URINE DRUG SCREEN, HOSP PERFORMED: Benzodiazepines: POSITIVE — AB

## 2012-10-30 MED ORDER — IBUPROFEN 400 MG PO TABS
400.0000 mg | ORAL_TABLET | Freq: Once | ORAL | Status: AC
  Administered 2012-10-30: 400 mg via ORAL
  Filled 2012-10-30: qty 1

## 2012-10-30 NOTE — ED Provider Notes (Signed)
History     CSN: [id number]  Arrival date & time 10/30/12  1029   First MD Initiated Contact with Patient 10/30/12 1037      Chief Complaint  Patient presents with  . Hypoglycemia    (Consider location/radiation/quality/duration/timing/severity/associated sxs/prior treatment) The history is provided by the patient and medical records. No language interpreter was used.    Monica WARSHAW is a 52 y.o. female  with a hx of DM, bipolar, Hep C, tobacco abuse presents to the Emergency Department complaining of acute hypoglycemic episode onset this AM. Associated symptoms include altered normal status.  EMS states they were called out for low blood sugar, upon arrival CBG was 23. Patient given one amp of D50 with CBG remaining low. I be infiltrated therefore patient was given 1 mg of gone IM with a CBG of 63. Pt denies fever, chills, headache, neck pain, chest pain, abdominal pain, shortness of breath, nausea, vomiting, diarrhea, dysuria, hematuria, weakness, syncope.  Pt takes suboxone for opioid abuse; is requesting narcotic pain medication.       Past Medical History  Diagnosis Date  . Diabetes mellitus   . Bipolar disorder   . Depression   . Hepatitis C   . Psychotic episode   . CORNEAL ABRASION, RIGHT 08/20/2010    Qualifier: Diagnosis of  By: Daphine Deutscher FNP, Zena Amos      Past Surgical History  Procedure Laterality Date  . Cesarean section      No family history on file.  History  Substance Use Topics  . Smoking status: Current Every Day Smoker -- 1.00 packs/day    Types: Cigarettes  . Smokeless tobacco: Never Used  . Alcohol Use: No    OB History   Grav Para Term Preterm Abortions TAB SAB Ect Mult Living                  Review of Systems  Constitutional: Negative for fever, diaphoresis, appetite change, fatigue and unexpected weight change.  HENT: Negative for mouth sores and neck stiffness.   Eyes: Negative for visual disturbance.  Respiratory: Negative for  cough, chest tightness, shortness of breath and wheezing.   Cardiovascular: Negative for chest pain.  Gastrointestinal: Negative for nausea, vomiting, abdominal pain, diarrhea and constipation.  Endocrine: Negative for polydipsia, polyphagia and polyuria.  Genitourinary: Negative for dysuria, urgency, frequency and hematuria.  Musculoskeletal: Negative for back pain.  Skin: Negative for rash.  Allergic/Immunologic: Negative for immunocompromised state.  Neurological: Negative for syncope, light-headedness and headaches.  Hematological: Does not bruise/bleed easily.  Psychiatric/Behavioral: Negative for sleep disturbance. The patient is not nervous/anxious.     Allergies  Review of patient's allergies indicates no known allergies.  Home Medications   Current Outpatient Rx  Name  Route  Sig  Dispense  Refill  . buprenorphine-naloxone (SUBOXONE) 8-2 MG SUBL   Sublingual   Place 1 tablet under the tongue daily.         . clonazePAM (KLONOPIN) 1 MG tablet   Oral   Take 1 mg by mouth 3 (three) times daily as needed for anxiety.          . furosemide (LASIX) 40 MG tablet   Oral   Take 1 tablet (40 mg total) by mouth 2 (two) times daily.   60 tablet   5   . gabapentin (NEURONTIN) 800 MG tablet   Oral   Take 800 mg by mouth 3 (three) times daily.         Marland Kitchen glucose  blood (PRODIGY TEST) test strip      Use as instructed   100 each   12   . hydrOXYzine (VISTARIL) 100 MG capsule   Oral   Take 100 mg by mouth 3 (three) times daily as needed.         . insulin glargine (LANTUS) 100 UNIT/ML injection   Subcutaneous   Inject 25 Units into the skin at bedtime.   10 mL   5   . insulin lispro (HUMALOG) 100 UNIT/ML injection   Subcutaneous   Inject 2-8 Units into the skin daily. PT USES SLIDING SCALE   10 mL   3   . lamoTRIgine (LAMICTAL) 100 MG tablet   Oral   Take 100 mg by mouth 2 (two) times daily.         . ziprasidone (GEODON) 80 MG capsule   Oral   Take  80 mg by mouth 2 (two) times daily with a meal.           BP 91/62  Pulse 49  Temp(Src) 98.7 F (37.1 C) (Oral)  Resp 14  SpO2 96%  Physical Exam  Nursing note and vitals reviewed. Constitutional: She is oriented to person, place, and time. She appears well-developed and well-nourished. No distress.  HENT:  Head: Normocephalic and atraumatic.  Mouth/Throat: Oropharynx is clear and moist. No oropharyngeal exudate.  Eyes: Conjunctivae are normal. No scleral icterus.  Pupils dilated and sluggish; sclera injected  Neck: Normal range of motion. Neck supple.  Cardiovascular: Normal rate, regular rhythm, normal heart sounds and intact distal pulses.  Exam reveals no gallop and no friction rub.   No murmur heard. Pulmonary/Chest: Effort normal and breath sounds normal. No respiratory distress. She has no wheezes.  Abdominal: Soft. Bowel sounds are normal. She exhibits no mass. There is no tenderness. There is no rebound and no guarding.  Musculoskeletal: Normal range of motion. She exhibits no edema and no tenderness.  Lymphadenopathy:    She has no cervical adenopathy.  Neurological: She is alert and oriented to person, place, and time. She has normal strength. No cranial nerve deficit or sensory deficit. She exhibits normal muscle tone. Coordination normal. GCS eye subscore is 3. GCS verbal subscore is 5. GCS motor subscore is 6.  Reflex Scores:      Tricep reflexes are 2+ on the right side and 2+ on the left side.      Bicep reflexes are 2+ on the right side and 2+ on the left side.      Brachioradialis reflexes are 2+ on the right side and 2+ on the left side.      Patellar reflexes are 2+ on the right side and 2+ on the left side.      Achilles reflexes are 2+ on the right side and 2+ on the left side. Moves extremities without ataxia  Pt appears to be under the influence; is very sleepy but arousable to voice.    Skin: Skin is warm and dry. No rash noted. She is not diaphoretic.  No erythema.  Psychiatric: Her affect is labile. Her speech is slurred. She is agitated. She is not actively hallucinating. She expresses no homicidal and no suicidal ideation.    ED Course  Procedures (including critical care time)  Labs Reviewed  GLUCOSE, CAPILLARY - Abnormal; Notable for the following:    Glucose-Capillary 101 (*)    All other components within normal limits  GLUCOSE, CAPILLARY - Abnormal; Notable for the following:  Glucose-Capillary 103 (*)    All other components within normal limits  URINALYSIS, ROUTINE W REFLEX MICROSCOPIC   No results found.   1. Hypoglycemia associated with diabetes   2. Opioid dependence   3. Benzodiazepine abuse   4. DIABETES MELLITUS, TYPE I, ADULT ONSET   5. HEPATITIS C   6. TOBACCO ABUSE   7. SUBSTANCE ABUSE, MULTIPLE       MDM  Johny Shears present for hypoglycemic episode, but appears significantly under the influence at this time.  Patient refusing blood work here in the department.  Urinalysis without evidence of urinary tract infection but UDS was positive for benzos and cocaine. Patient last glucose at 119.  Would like to see pt with higher glucose before discharge.    3:51 PM Pt is refusing to eat at this time and is leaving AMA.        Dahlia Client Kevis Qu, PA-C 10/30/12 1553

## 2012-10-30 NOTE — ED Notes (Signed)
Received pt from home with c/o low blood sugar. Upon arrival of EMS to house pt CBG 23. Pt given 1 amp of D50 by EMS, CBG remained low. EMS IV infiltrated, pt given 1 mg of glucagon IM by EMS. Last CBG for EMS 63. Pt has pain and swelling to left arm from IV site malposition.

## 2012-11-01 NOTE — ED Provider Notes (Signed)
Medical screening examination/treatment/procedure(s) were performed by non-physician practitioner and as supervising physician I was immediately available for consultation/collaboration.  Tobie Hellen T Avah Bashor, MD 11/01/12 1957 

## 2012-11-29 ENCOUNTER — Encounter: Payer: Self-pay | Admitting: Family Medicine

## 2012-11-29 DIAGNOSIS — H18839 Recurrent erosion of cornea, unspecified eye: Secondary | ICD-10-CM | POA: Insufficient documentation

## 2012-12-16 ENCOUNTER — Ambulatory Visit (INDEPENDENT_AMBULATORY_CARE_PROVIDER_SITE_OTHER): Payer: Medicaid Other | Admitting: Family Medicine

## 2012-12-16 VITALS — BP 107/71 | HR 68 | Temp 97.8°F | Wt 168.0 lb

## 2012-12-16 DIAGNOSIS — G609 Hereditary and idiopathic neuropathy, unspecified: Secondary | ICD-10-CM

## 2012-12-16 DIAGNOSIS — E1169 Type 2 diabetes mellitus with other specified complication: Secondary | ICD-10-CM

## 2012-12-16 DIAGNOSIS — M25569 Pain in unspecified knee: Secondary | ICD-10-CM

## 2012-12-16 DIAGNOSIS — M25562 Pain in left knee: Secondary | ICD-10-CM

## 2012-12-16 DIAGNOSIS — M25561 Pain in right knee: Secondary | ICD-10-CM

## 2012-12-16 DIAGNOSIS — Z79899 Other long term (current) drug therapy: Secondary | ICD-10-CM

## 2012-12-16 DIAGNOSIS — E109 Type 1 diabetes mellitus without complications: Secondary | ICD-10-CM

## 2012-12-16 DIAGNOSIS — E11649 Type 2 diabetes mellitus with hypoglycemia without coma: Secondary | ICD-10-CM

## 2012-12-16 LAB — GLUCOSE, CAPILLARY: Glucose-Capillary: 42 mg/dL — CL (ref 70–99)

## 2012-12-16 MED ORDER — GABAPENTIN 800 MG PO TABS: 400.0000 mg | ORAL_TABLET | Freq: Three times a day (TID) | ORAL | Status: DC

## 2012-12-16 MED ORDER — GLUCOSE 40 % PO GEL: 1.0000 | Freq: Once | ORAL | Status: DC

## 2012-12-16 MED ORDER — GLUCOSE 40 % PO GEL
1.0000 | Freq: Once | ORAL | Status: AC
  Administered 2012-12-16: 37.5 g via ORAL

## 2012-12-16 NOTE — Progress Notes (Signed)
10:03 AM - glucose = 22 mg/dL, critical value reported immediately to MD, gave pt 1 tube glutose15 by mouth (15 gms glucose), followed this with 50 gm GTT glucola drink;     10:15 am repeated glucose = 25 mg/dL, critical value reported immediately to MD, gave pt another 50 gm GTT glucola drink;   10:27 am repeated glucose = 42 mg/dL,  Critical value reported immediately to MD, continued to observe patient;  10:47 am - repeated glucose = 102 mg/dL, reported to MD, gave patient 2 ritz with peanut butter, ate and drank water;  Dewitt Hoes, MLS (ASCP)cm

## 2012-12-16 NOTE — Patient Instructions (Addendum)
  Diabetes - Please take 18 units a night of Lantus - Please take 3 units as needed 3 times day of humalog if CBG > 250  - Please call the clinic tomorrow to let me know what your blood sugars have been today  Neuropathy - Please decrease to 400 mg of neurotin three times a day  Knee Pain - We need to get X-rays at the hospital.   Follow up with Dr. Raymondo Band as early as possible next week.  Follow up with me in 2-3 weeks for your knee pain.   Sincerely,   Dr.Talmage Teaster

## 2012-12-17 DIAGNOSIS — Z79899 Other long term (current) drug therapy: Secondary | ICD-10-CM | POA: Insufficient documentation

## 2012-12-17 DIAGNOSIS — E11649 Type 2 diabetes mellitus with hypoglycemia without coma: Secondary | ICD-10-CM | POA: Insufficient documentation

## 2012-12-17 NOTE — Progress Notes (Signed)
  Subjective:    Patient ID: Monica [name], female    DOB: 01-13-1961, 52 y.o.   MRN: [id number]  HPI  52 year old F with Type 1 DM, bipolar disorder, and bilateral knee pain who was originally scheduled for an evaluation of her bilateral knee pain. However, upon presentation she was noted to be slurring her words and having altered mentation. At 10:03 AM, a capillary blood glucose was immediately checked and determined to be 22 mg/dl. She was immediately given 15 g of glucose gel and 50 gm of glucola. At 10:15 AM, her CBG was 25 mg/dl, and she was then given 50 gm of glucola. At 10:27 AM, her CBG was 42 mg/dL. At this point, I spoke with the patient about her insulin regimen. She stated that she was took 22 units of Lantus insulin last night and 5 units of humalog insulin this morning. Her breakfast consisted of a half of a banana. She states that she was too tired to eat any more breakfast. She states that she only took her regularly prescribed medications this morning including suboxone 8-2 mg, clonazepam 1 mg, furosemide 40 mg, gabapentin 800 mg, hydroxyzine 100 mg, lamictal 100 mg, and ziprasidone 80 mg. She denies taking any other medications this morning. After explaining to the patient that I was exteremly concerned about the lability of her blood sugar, I recommended admittance to the hospital for 24 hour observation. I insisted, but the patient refused to acknowledge the importance of having low blood sugars and repeatedly said that they have been this low in the past. I asked for the assistance of Dr. Doralee Albino, who agreed that the patient was not safe to leave the clinic at this point. Upon hearing this, the patient immediately collected her belongings and ran outside of the clinic with the intent to leave. However, she did not have a ride. We convinced her to return to the clinic for further observation and care. She agreed.   At 10:47 AM her CBG was 102 mg/dL, and she was then given  several crackers with peanut butter. The patient was still markedly sedated and proceeded to sleep in an exam room for 1 hour. Thereafter, I reassessed the patient. She continued to deny concerns about having low blood sugars and stated more concern about having high blood sugars, because they make her "irriatble." She continued to deny any recreational drug use. She stated that she stayed up late watching television, which was the cause for her somnolence.    Review of Systems She denied fever, chills, weight loss    Objective:   Physical Exam - Noted after resolution of hypoglycemia at approximately 11:45 AM  BP 107/71  Pulse 68  Temp(Src) 97.8 F (36.6 C) (Oral)  Wt 168 lb (76.204 kg)  BMI 27.96 kg/m2 Gen: middle aged WF, intoxicated appearing HEENT: PERRLA CV: RRR, no murmurs Lungs: CTA-B Skin: warm, dry, no evidence of abrasions or lacerations Knees: 2x3 cm firm, fixed rubbery nodule on anterior left knee Psych: falls asleep during middle of interview, slurring words, difficulty performing simple tasks like dialing telephone, oriented x 3 when awake, no focal deficits, gait      Assessment & Plan:  52 year old F with poorly controlled diabetes and life-threatening hypoglycemia exacerbated by polyphaarmacy. Greater than 2 hours were spent caring for this patient.

## 2012-12-17 NOTE — Assessment & Plan Note (Signed)
Patient claims to have been having this investigated at [facility]. However, my notes from this group do not address this specifically. I previously made a referral for her to [facility]. She did not make this appointment, so I canceled that referral. I will start with standing X-ray of her knees bilaterally with complete view of left knee for better assessment of the mass.

## 2012-12-22 ENCOUNTER — Ambulatory Visit (HOSPITAL_COMMUNITY)
Admission: RE | Admit: 2012-12-22 | Discharge: 2012-12-22 | Disposition: A | Discharge status: Home / Self Care | Payer: Medicaid Other | Source: Ambulatory Visit | Attending: Family Medicine | Admitting: Family Medicine

## 2012-12-22 ENCOUNTER — Encounter: Payer: Self-pay | Admitting: Family Medicine

## 2012-12-22 DIAGNOSIS — M25469 Effusion, unspecified knee: Secondary | ICD-10-CM | POA: Insufficient documentation

## 2012-12-22 DIAGNOSIS — IMO0002 Reserved for concepts with insufficient information to code with codable children: Secondary | ICD-10-CM | POA: Insufficient documentation

## 2012-12-22 DIAGNOSIS — M25562 Pain in left knee: Secondary | ICD-10-CM

## 2012-12-22 DIAGNOSIS — M1710 Unilateral primary osteoarthritis, unspecified knee: Secondary | ICD-10-CM | POA: Insufficient documentation

## 2012-12-22 DIAGNOSIS — M25569 Pain in unspecified knee: Secondary | ICD-10-CM | POA: Insufficient documentation

## 2012-12-29 ENCOUNTER — Encounter (HOSPITAL_COMMUNITY): Payer: Self-pay | Admitting: *Deleted

## 2012-12-29 ENCOUNTER — Emergency Department (HOSPITAL_COMMUNITY)
Admission: EM | Admit: 2012-12-29 | Discharge: 2012-12-29 | Disposition: A | Discharge status: Home / Self Care | Payer: Medicaid Other | Attending: Emergency Medicine | Admitting: Emergency Medicine

## 2012-12-29 DIAGNOSIS — Z79899 Other long term (current) drug therapy: Secondary | ICD-10-CM | POA: Insufficient documentation

## 2012-12-29 DIAGNOSIS — E119 Type 2 diabetes mellitus without complications: Secondary | ICD-10-CM | POA: Insufficient documentation

## 2012-12-29 DIAGNOSIS — Z87828 Personal history of other (healed) physical injury and trauma: Secondary | ICD-10-CM | POA: Insufficient documentation

## 2012-12-29 DIAGNOSIS — H5789 Other specified disorders of eye and adnexa: Secondary | ICD-10-CM | POA: Insufficient documentation

## 2012-12-29 DIAGNOSIS — H538 Other visual disturbances: Secondary | ICD-10-CM | POA: Insufficient documentation

## 2012-12-29 DIAGNOSIS — Z8619 Personal history of other infectious and parasitic diseases: Secondary | ICD-10-CM | POA: Insufficient documentation

## 2012-12-29 DIAGNOSIS — H5710 Ocular pain, unspecified eye: Secondary | ICD-10-CM | POA: Insufficient documentation

## 2012-12-29 DIAGNOSIS — Z794 Long term (current) use of insulin: Secondary | ICD-10-CM | POA: Insufficient documentation

## 2012-12-29 DIAGNOSIS — F17200 Nicotine dependence, unspecified, uncomplicated: Secondary | ICD-10-CM | POA: Insufficient documentation

## 2012-12-29 DIAGNOSIS — F319 Bipolar disorder, unspecified: Secondary | ICD-10-CM | POA: Insufficient documentation

## 2012-12-29 DIAGNOSIS — H5711 Ocular pain, right eye: Secondary | ICD-10-CM

## 2012-12-29 MED ORDER — FLUORESCEIN SODIUM 1 MG OP STRP
1.0000 | ORAL_STRIP | Freq: Once | OPHTHALMIC | Status: AC
  Administered 2012-12-29: 1 via OPHTHALMIC
  Filled 2012-12-29: qty 1

## 2012-12-29 MED ORDER — POLYMYXIN B-TRIMETHOPRIM 10000-0.1 UNIT/ML-% OP SOLN: 1.0000 [drp] | OPHTHALMIC | Status: DC

## 2012-12-29 MED ORDER — OXYCODONE-ACETAMINOPHEN 5-325 MG PO TABS
2.0000 | ORAL_TABLET | Freq: Once | ORAL | Status: DC
  Filled 2012-12-29: qty 2

## 2012-12-29 MED ORDER — TETRACAINE HCL 0.5 % OP SOLN
1.0000 [drp] | Freq: Once | OPHTHALMIC | Status: AC
  Administered 2012-12-29: 1 [drp] via OPHTHALMIC
  Filled 2012-12-29: qty 2

## 2012-12-29 MED ORDER — PROPARACAINE HCL 0.5 % OP SOLN: 1.0000 [drp] | Freq: Once | OPHTHALMIC | Status: DC

## 2012-12-29 MED ORDER — TETRACAINE HCL 0.5 % OP SOLN
1.0000 [drp] | Freq: Once | OPHTHALMIC | Status: AC
  Administered 2012-12-29: 1 [drp] via OPHTHALMIC

## 2012-12-29 MED ORDER — IBUPROFEN 800 MG PO TABS: 800.0000 mg | ORAL_TABLET | Freq: Three times a day (TID) | ORAL | Status: DC

## 2012-12-29 NOTE — ED Notes (Signed)
Eye medication were administered by EDPA Molly Maduro not this Clinical research associate

## 2012-12-29 NOTE — ED Notes (Signed)
Per ems pt had eye injury as a kid, now has macular degeneration. At present pt dove into the pool today with her eyes open and feels like she scratched her right eye. ems did not note any bleeding or visible scratch.

## 2012-12-29 NOTE — ED Provider Notes (Signed)
History    This chart was scribed for Monica Horseman, PA working with Juliet Rude. Rubin Payor, MD by ED Scribe, Burman Nieves. This patient was seen in room WTR5/WTR5 and the patient's care was started at 5:42 PM.   CSN: [id number]  Arrival date & time 12/29/12  1734   First MD Initiated Contact with Patient 12/29/12 1742      Chief Complaint  Patient presents with  . Eye Injury    (Consider location/radiation/quality/duration/timing/severity/associated sxs/prior treatment) The history is provided by the patient. No language interpreter was used.   HPI Comments: Monica [name] is a 52 y.o. female with h/o DM, bipolar disorder, depression, hepatitis c, corneal abrasion (right eye) BIB EMS who presents to the Emergency Department complaining of moderate constant right eye pain due to jumping in a swimming pool earlier today. Pt states that she jumped into the pool today with her eyes open causing her a lot of discomfort in her right eye. She states that she is unable to open her right eye due to the pain. Pt states that she injured her right eye 20 years ago playing with her daughter and now has macular degeneration. EMS did not note any bleeding or visible scratch. Pt denies fever, chills, cough, nausea, vomiting, diarrhea, SOB, weakness, and any other associated symptoms. Pt's current PCP is Dr. Clinton Sawyer.   Past Medical History  Diagnosis Date  . Diabetes mellitus   . Bipolar disorder   . Depression   . Hepatitis C   . Psychotic episode   . CORNEAL ABRASION, RIGHT 08/20/2010    Qualifier: Diagnosis of  By: Monica Deutscher FNP, Zena Amos      Past Surgical History  Procedure Laterality Date  . Cesarean section      History reviewed. No pertinent family history.  History  Substance Use Topics  . Smoking status: Current Every Day Smoker -- 1.00 packs/day    Types: Cigarettes  . Smokeless tobacco: Never Used  . Alcohol Use: No    OB History   Grav Para Term Preterm Abortions TAB SAB  Ect Mult Living                  Review of Systems  Eyes: Positive for pain, redness and visual disturbance.  All other systems reviewed and are negative.    Allergies  Review of patient's allergies indicates no known allergies.  Home Medications   Current Outpatient Rx  Name  Route  Sig  Dispense  Refill  . buprenorphine-naloxone (SUBOXONE) 8-2 MG SUBL   Sublingual   Place 1 tablet under the tongue daily.         . clonazePAM (KLONOPIN) 1 MG tablet   Oral   Take 1 mg by mouth 3 (three) times daily as needed for anxiety.          . furosemide (LASIX) 40 MG tablet   Oral   Take 1 tablet (40 mg total) by mouth 2 (two) times daily.   60 tablet   5   . gabapentin (NEURONTIN) 800 MG tablet   Oral   Take 0.5 tablets (400 mg total) by mouth 3 (three) times daily.   120 tablet   0   . glucose blood (PRODIGY TEST) test strip      Use as instructed   100 each   12   . hydrOXYzine (VISTARIL) 100 MG capsule   Oral   Take 100 mg by mouth 3 (three) times daily as needed.         Marland Kitchen  insulin glargine (LANTUS) 100 UNIT/ML injection   Subcutaneous   Inject 25 Units into the skin at bedtime.   10 mL   5   . insulin lispro (HUMALOG) 100 UNIT/ML injection   Subcutaneous   Inject 2-8 Units into the skin daily. PT USES SLIDING SCALE   10 mL   3   . lamoTRIgine (LAMICTAL) 100 MG tablet   Oral   Take 100 mg by mouth 2 (two) times daily.         . ziprasidone (GEODON) 80 MG capsule   Oral   Take 80 mg by mouth 2 (two) times daily with a meal.           BP 111/78  Pulse 76  Temp(Src) 97.8 F (36.6 C) (Oral)  Resp 16  SpO2 100%  Physical Exam  Nursing note and vitals reviewed. Constitutional: She is oriented to person, place, and time. She appears well-developed and well-nourished. No distress.  HENT:  Head: Normocephalic and atraumatic.  Eyes: EOM are normal.  Right eye pressure 18. No fluorescein uptake. No signs of foreign body on slit lamp exam. Eye  lid was everted. Normal funduscopic exam with normal retinal vessels and clear optic disc.  Neck: Neck supple. No tracheal deviation present.  Cardiovascular: Normal rate.   Pulmonary/Chest: Effort normal. No respiratory distress.  Musculoskeletal: Normal range of motion.  Neurological: She is alert and oriented to person, place, and time.  Skin: Skin is warm and dry.  Psychiatric: She has a normal mood and affect. Her behavior is normal.    ED Course  Procedures (including critical care time) DIAGNOSTIC STUDIES: Oxygen Saturation is 100% on room air, normal by my interpretation.    COORDINATION OF CARE: 6:03 PM Discussed with pt of slit lamp exam for further evaluation.  6:37 PM Discussed with pt that she will be sent home with eye drops and pain medication. Advised pt to follow up with ophthalmologist for further evaluation.    Labs Reviewed - No data to display No results found.   1. Eye pain, right       MDM  Patient eye pain.  No signs of acute trauma.  No uptake with black light exam, no foreign body.  Patient requests demerol. She is taking suboxone.  I do not feel that additional narcotic pain medications are indicated for discharge.  I do not feel that it would be in the patient's best interest, therefore I will discharge with ibuprofen and abx eye drops.  Patient discussed with Dr. Rubin Payor, who agrees with the plan.  Patient is stable and ready for discharge.    I personally performed the services described in this documentation, which was scribed in my presence. The recorded information has been reviewed and is accurate.      Monica Horseman, PA-C 12/29/12 1846

## 2012-12-29 NOTE — ED Provider Notes (Signed)
Medical screening examination/treatment/procedure(s) were performed by non-physician practitioner and as supervising physician I was immediately available for consultation/collaboration.  Vandell Kun R. Greenlee Ancheta, MD 12/29/12 2331 

## 2012-12-31 ENCOUNTER — Telehealth: Payer: Self-pay | Admitting: Family Medicine

## 2012-12-31 DIAGNOSIS — E109 Type 1 diabetes mellitus without complications: Secondary | ICD-10-CM

## 2012-12-31 NOTE — Telephone Encounter (Signed)
Rx called into [facility] for Accu-check Aviva plus monitor. Patient called and informed that her meter could be picked up a the pharmacy today. She is agreeable to this.

## 2012-12-31 NOTE — Telephone Encounter (Signed)
Pt needs Accucheck Aviva Blood Sugar monitor sent in to pharmacy.  Aniko Finnigan, Darlyne Russian, CMA

## 2012-12-31 NOTE — Telephone Encounter (Signed)
Patient is calling because her Blood Sugar Monitor is broken and she would like a Rx for a new one so that she can get one at a lower cost.  She would like a call back when this has been sent to [facility] on [address].

## 2013-02-10 ENCOUNTER — Other Ambulatory Visit: Payer: Self-pay

## 2013-03-17 ENCOUNTER — Other Ambulatory Visit: Payer: Self-pay | Admitting: Family Medicine

## 2013-03-17 NOTE — Telephone Encounter (Signed)
Pt said that her pharmacy sent over refill request over to [place] and she was checking the status. It was for lantus. JW

## 2013-03-31 ENCOUNTER — Other Ambulatory Visit: Payer: Self-pay | Admitting: Family Medicine

## 2013-04-29 ENCOUNTER — Encounter: Payer: Self-pay | Admitting: Family Medicine

## 2013-04-29 ENCOUNTER — Ambulatory Visit (INDEPENDENT_AMBULATORY_CARE_PROVIDER_SITE_OTHER): Payer: Medicaid Other | Admitting: Family Medicine

## 2013-04-29 VITALS — BP 121/67 | HR 66 | Temp 97.5°F | Wt 162.0 lb

## 2013-04-29 DIAGNOSIS — R2242 Localized swelling, mass and lump, left lower limb: Secondary | ICD-10-CM

## 2013-04-29 DIAGNOSIS — R29898 Other symptoms and signs involving the musculoskeletal system: Secondary | ICD-10-CM

## 2013-04-29 DIAGNOSIS — E109 Type 1 diabetes mellitus without complications: Secondary | ICD-10-CM

## 2013-04-29 DIAGNOSIS — M25561 Pain in right knee: Secondary | ICD-10-CM

## 2013-04-29 DIAGNOSIS — R110 Nausea: Secondary | ICD-10-CM

## 2013-04-29 DIAGNOSIS — R229 Localized swelling, mass and lump, unspecified: Secondary | ICD-10-CM

## 2013-04-29 DIAGNOSIS — M25569 Pain in unspecified knee: Secondary | ICD-10-CM

## 2013-04-29 LAB — GLUCOSE, CAPILLARY: Glucose-Capillary: 207 mg/dL — ABNORMAL HIGH (ref 70–99)

## 2013-04-29 NOTE — Progress Notes (Signed)
  Subjective:    Patient ID: Monica [name], female    DOB: 1960-11-22, 52 y.o.   MRN: [id number]  HPI  52 year old female with a history of poorly controlled diabetes, and depression, bipolar disorder who presents for evaluation of knee pain.  Knee Pain:   > Laterality: the patient's knee pain is bilateral, is also associated with a multiple severe nodule the left anterior knee which is been there for several years but is getting larger > Onset: this been hurting for several years >location:  pain is primarily located in the anterior knee > Character: worsening with movement > Inciting Event: none > Associated Symptoms: patient is a large nodule in the left anterior knee which was seen in the previous visit patient denies catching or locking of the knees, also denies any falls as a result of the pain > Alleviating: full tear and 75 mg every 3 days, but has not taken it to 2 instructions for her renal doctor > Exacerbating: bending of the knees > Hx of injury: none > Hx of imaging: May 2014 - standing AP films showed only mild medial compartment arthritis; a complete view of the left knee did not demonstrate B. Nodule that is palpable skin   Review of Systems See history of present illness    Objective:   Physical Exam  BP 121/67  Pulse 66  Temp(Src) 97.5 F (36.4 C) (Oral)  Wt 162 lb (73.483 kg)  BMI 26.96 kg/m2  Gen: well appearing middle aged WF, non distressed, pleasant and conversant  Knee Exam:  Laterality: right and left Appearance: firm rubbery, well-circumscribed nodule of left anterior knee to the left lower pole of the patella, otherwise normal appearing bilaterally Edema: no lateral, medial  Tenderness: yes  Left lateral joint line along the nodule Range of Motion: > Passive Extension: normal Flexion:normal > Active Extension: normal Flexion: normal Laxity: no laxity with valgus or varus stress bilaterally Maneuvers: > Lachman's: negative > Grind:  negative > Posterior Drawer: negative > Patellar Compression: negative Strength:  > Quadricep: 5/5 > Hamstring: 5/5 Gait: normal    Musculoskeletal ultrasound: Bedside ultrasound performed demonstrated well-circumscribed 3 cm x 2 cm nodule in the left anterior knee without hypoechoic area, and density of intracapsular material similar to surrounding adipose tissue, unable to tell if nodule involves the joint space      Assessment & Plan:

## 2013-04-29 NOTE — Patient Instructions (Addendum)
I think that you are suffering from arthritis in both of your knees. We also need to have that nodule on the left knee evaluated by ultrasound to get a formal read. Then I can make a referral. For now, please take the diclofenac 75 mg every other day to every 4 days. Then we can check your kidney function. I will let you know the results of the ultrasound when I get them.   Please follow up in 2 weeks to address diabetes. You can also wait until your next visit in 4 weeks if you would like.   Sincerely,   Dr. Clinton Sawyer

## 2013-05-01 ENCOUNTER — Encounter: Payer: Self-pay | Admitting: Family Medicine

## 2013-05-01 DIAGNOSIS — R229 Localized swelling, mass and lump, unspecified: Secondary | ICD-10-CM | POA: Insufficient documentation

## 2013-05-01 NOTE — Assessment & Plan Note (Signed)
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

## 2013-05-01 NOTE — Assessment & Plan Note (Addendum)
Assessment: bilateral knee pain without evidence of severe osteoporosis, ligamentous or tendon injury or meniscal tear; patient may have mild bilateral patellofemoral syndrome Plan: first relief patient will restart Voltaren 75 mg every 3 days as needed, at this dose should not impact her kidney function, patient return for recheck of kidney function one month; patient is a bad candidate for posterior root injections given lability of her blood sugars and certainly not candidate for any narcotic pain medication due to her history of substance abuse including heroine

## 2013-05-05 ENCOUNTER — Ambulatory Visit (HOSPITAL_COMMUNITY)
Admission: RE | Admit: 2013-05-05 | Discharge: 2013-05-05 | Disposition: A | Discharge status: Home / Self Care | Payer: Medicaid Other | Source: Ambulatory Visit | Attending: Family Medicine | Admitting: Family Medicine

## 2013-05-05 DIAGNOSIS — D2120 Benign neoplasm of connective and other soft tissue of unspecified lower limb, including hip: Secondary | ICD-10-CM | POA: Insufficient documentation

## 2013-05-05 DIAGNOSIS — R2242 Localized swelling, mass and lump, left lower limb: Secondary | ICD-10-CM

## 2013-05-06 ENCOUNTER — Telehealth: Payer: Self-pay | Admitting: Family Medicine

## 2013-05-06 NOTE — Telephone Encounter (Signed)
Attempted to call patient about ultrasound findings, but number busy. Will try next week.

## 2013-05-06 NOTE — Telephone Encounter (Signed)
Message copied by Garnetta Buddy on Fri May 06, 2013  2:17 PM ------      Message from: Donnella Sham J      Created: Fri May 06, 2013 11:31 AM       Zenaida Niece,            I think either way is fine. My gut is to let the orthopedic docs order the MRI. Thanks for letting me know about the results of the ultrasound.             Ronaldo Miyamoto       ----- Message -----         From: Garnetta Buddy, MD         Sent: 05/05/2013   5:26 PM           To: Uvaldo Rising, MD            Merrie Roof saw this patient with me and we ultrasounded her left knee. The official results of the ultrasound came back stated possible lipoma but recommended an MRI. Do you think that I should order and MRI or leave it to the discretion of the surgeon? Usually I like to leave expensive imaging studies to the person who will get the most utility out of it.             Thanks,             Zenaida Niece                   ----- Message -----         From: Rad Results In Interface         Sent: 05/05/2013   4:44 PM           To: Garnetta Buddy, MD                         ------

## 2013-05-09 ENCOUNTER — Telehealth: Payer: Self-pay | Admitting: Family Medicine

## 2013-05-09 NOTE — Telephone Encounter (Signed)
Left message for patient to call me regarding results of ultrasound.

## 2013-05-11 ENCOUNTER — Telehealth: Payer: Self-pay | Admitting: Family Medicine

## 2013-05-11 DIAGNOSIS — R229 Localized swelling, mass and lump, unspecified: Secondary | ICD-10-CM

## 2013-05-11 NOTE — Telephone Encounter (Signed)
Pt is returning Dr. Clinton Sawyer call about her ultra sound results. She is home now. JW

## 2013-05-11 NOTE — Telephone Encounter (Signed)
Will forward to Dr Williamson 

## 2013-05-18 NOTE — Telephone Encounter (Signed)
Called patient and reviewed the ultrasound finding of the left knee subcutaneous mass. She acknowledged understanding and also stated that she would like it removed due to pain. I will make a referral to [facility] [place] [facility] for consultation.

## 2013-05-26 ENCOUNTER — Ambulatory Visit (INDEPENDENT_AMBULATORY_CARE_PROVIDER_SITE_OTHER): Payer: Medicaid Other | Admitting: Family Medicine

## 2013-05-26 VITALS — BP 110/71 | HR 48 | Temp 97.3°F | Wt 169.0 lb

## 2013-05-26 DIAGNOSIS — R229 Localized swelling, mass and lump, unspecified: Secondary | ICD-10-CM

## 2013-05-26 DIAGNOSIS — E785 Hyperlipidemia, unspecified: Secondary | ICD-10-CM

## 2013-05-26 DIAGNOSIS — E1165 Type 2 diabetes mellitus with hyperglycemia: Secondary | ICD-10-CM

## 2013-05-26 DIAGNOSIS — Z23 Encounter for immunization: Secondary | ICD-10-CM

## 2013-05-26 DIAGNOSIS — E109 Type 1 diabetes mellitus without complications: Secondary | ICD-10-CM

## 2013-05-26 DIAGNOSIS — K5900 Constipation, unspecified: Secondary | ICD-10-CM | POA: Insufficient documentation

## 2013-05-26 MED ORDER — POLYETHYLENE GLYCOL 3350 17 GM/SCOOP PO POWD: 17.0000 g | Freq: Two times a day (BID) | ORAL | Status: DC | PRN

## 2013-05-26 MED ORDER — ATORVASTATIN CALCIUM 40 MG PO TABS: 40.0000 mg | ORAL_TABLET | Freq: Every day | ORAL | Status: DC

## 2013-05-26 NOTE — Patient Instructions (Signed)
Monica [name],   It was great to meet you. Please read below about the things that we discussed.   1. Diabetes - We really need to work on getting you to have more consistent blood sugars. Please keep a food journal of all that you eat and drink for the next week and record your blood sugars and the amount of insulin that you are taking at those times. Please fax this to me at [phone]. I also would like for you to meet with Dr. Raymondo Band at your earliest convenience.   2. High Cholesterol - The medication called Lipitor will reduce your risk of heart disease and stroke. Please take daily. If you develop side effects, then let me know. We will check your liver function at an upcoming visit.   3. Constipation - Please miralax for the next two days. If this does not work, then use glycerin suppository. More important is to have good daily bowel health. Please read below.   4. Knees - Continue the voltaren every other day. We will follow up your kidneys. Also, I will check on the referral to the surgeon.   Come back to see me in 4 weeks and Dr. Raymondo Band as well.   Sincerely,   Dr. Mellody Life TO [facility]. Irregular bowel habits such as constipation can lead to many problems over time.  Having one soft bowel movement a day is the most important way to prevent further problems.  The anorectal canal is designed to handle stretching and feces to safely manage our ability to get rid of solid waste (feces, poop, stool) out of our body.  BUT, hard constipated stools can act like ripping concrete bricks causing inflamed hemorrhoids, anal fissures, abdominal pain and bloating.     The goal: ONE SOFT BOWEL MOVEMENT A DAY!  To have soft, regular bowel movements:    Drink at least 8 tall glasses of water a day.     Take plenty of fiber.  Fiber is the undigested part of plant food that passes into the colon, acting s "natures broom" to encourage bowel motility and movement.  Fiber can absorb  and hold large amounts of water. This results in a larger, bulkier stool, which is soft and easier to pass. Work gradually over several weeks up to 6 servings a day of fiber (25g a day even more if needed) in the form of: o Vegetables -- Root (potatoes, carrots, turnips), leafy green (lettuce, salad greens, celery, spinach), or cooked high residue (cabbage, broccoli, etc) o Fruit -- Fresh (unpeeled skin & pulp), Dried (prunes, apricots, cherries, etc ),  or stewed ( applesauce)  o Whole grain breads, pasta, etc (whole wheat)  o Bran cereals    Bulking Agents -- This type of water-retaining fiber generally is easily obtained each day by one of the following:  o Psyllium bran -- The psyllium plant is remarkable because its ground seeds can retain so much water. This product is available as Metamucil, Konsyl, Effersyllium, Per Diem Fiber, or the less expensive generic preparation in drug and health food stores. Although labeled a laxative, it really is not a laxative.  o Methylcellulose -- This is another fiber derived from wood which also retains water. It is available as Citrucel. o Polyethylene Glycol - and "artificial" fiber commonly called Miralax or Glycolax.  It is helpful for people with gassy or bloated feelings with regular fiber o Flax Seed - a less gassy fiber than psyllium  No reading or other relaxing activity while on the toilet. If bowel movements take longer than 5 minutes, you are too constipated   AVOID CONSTIPATION.  High fiber and water intake usually takes care of this.  Sometimes a laxative is needed to stimulate more frequent bowel movements, but    Laxatives are not a good long-term solution as it can wear the colon out. o Osmotics (Milk of Magnesia, Fleets phosphosoda, Magnesium citrate, MiraLax, GoLytely) are safer than  o Stimulants (Senokot, Castor Oil, Dulcolax, Ex Lax)    o Do not take laxatives for more than 7days in a row.    IF SEVERELY CONSTIPATED, try a Bowel  Retraining Program: o Do not use laxatives.  o Eat a diet high in roughage, such as bran cereals and leafy vegetables.  o Drink six (6) ounces of prune or apricot juice each morning.  o Eat two (2) large servings of stewed fruit each day.  o Take one (1) heaping tablespoon of a psyllium-based bulking agent twice a day. Use sugar-free sweetener when possible to avoid excessive calories.  o Eat a normal breakfast.  o Set aside 15 minutes after breakfast to sit on the toilet, but do not strain to have a bowel movement.  o If you do not have a bowel movement by the third day, use an enema and repeat the above steps.

## 2013-05-26 NOTE — Assessment & Plan Note (Signed)
Assessment: Hgb A1C is 6.5 but this seems very misleading since patient is reports GBS ranging from 46-400, so the average is really ignoring the extremes of blood sugar, which seem related to varied eating patterns and inconsistent use of insulin Plan: - Currently continue Lantus 25 units QHS and humalog at tolerate - Patient counseled extensively on need for consistent diet and standard approach to CBG, will start recording food logs with CBG and insulin dosing - Encouraged patient to follow up with Dr. Raymondo Band

## 2013-05-26 NOTE — Assessment & Plan Note (Signed)
Patient feels like nodule is continuing to get larger. I will check on referral.

## 2013-05-26 NOTE — Assessment & Plan Note (Signed)
Assessment: low fiber diet and possible contribution for gastroparesis Plan: given rx for miralax and detailed information about bowel health

## 2013-05-26 NOTE — Progress Notes (Signed)
Patient ID: Monica [name], female   DOB: Oct 07, 1960, 52 y.o.   MRN: [id number]   Subjective:   Diabetes  History of Diabetes - diagnosed at age 59 and started on metformin and notes the sugars were very well controlled during first few years; then started to have more labile blood sugar and transitioned to inuslin; patient has seen nutritionist in past, but does not currently count carbohydrates ; never seen endocrinologist   Recent Issues:  Hypoglycemia: Had a CBG of 42, drank 1 glass of juice and it resolved, but also having high blood sugars in 300s consistently; notes that each night she eats a pack of crackers to prevent low blood sugar when she wakes up b/c has a history of numerous episodes of severe hypoglycemia; again patient states she is "more worried about high blood sugar"    Medication Compliance: Diabetes medication: Yes, but very uncertain regimen - Humalog 0-8 unts with meals depending on CBG, wouldn't take any if CBG < 200, would take 8 units if CBG > 400 - Lantus 25 units QHS  Taking ACE-I: No - pt with CKD stage III, not started on by nephrologist  Taking statin: No - patient has history of taking Zetia   Behavioral: Home CBG Monitoring: Yes Diet changes: No - very inconsistent eating patterns, sometimes skips meals to avoid having to take insulin and always eats crackers at before bed so she won't have severe hypoglycemia in her sleep  Exercise: Yes - very minimal exercise due to knee pain   Health Maintenance: Visual problems: yes Last eye exam: this summer, patient does not remember name of opthalmologist Last dental visit: unknown Foot ulcers: No   Knee Pain Follow Up -  Bilateral anterior knee pain with recent work up including X-ray in May showing mild OA and ultrasound left knee demonstrated large solid subcutaneous mass for which the patient has been referred for surgical excision - Currently still with anterior knee pain - Exacerbated by  jogging/walking on treadmill and bending - Alleviated by taking diclofenac 75 mg every other day which she does to decrease risk of kidney damage from CKD III - No swelling, redness or decreased ROM since last visit  Review of Systems:  Gastrointestinal: positive for constipation  Current Outpatient Prescriptions on File Prior to Visit  Medication Sig Dispense Refill  . buprenorphine-naloxone (SUBOXONE) 8-2 MG SUBL Place 0.5 tablets under the tongue daily.       . clonazePAM (KLONOPIN) 1 MG tablet Take 1 mg by mouth 3 (three) times daily as needed for anxiety.       . furosemide (LASIX) 40 MG tablet Take 1 tablet (40 mg total) by mouth 2 (two) times daily.  60 tablet  5  . gabapentin (NEURONTIN) 800 MG tablet Take 800 mg by mouth 2 (two) times daily.      Marland Kitchen glucose blood (PRODIGY TEST) test strip Use as instructed  100 each  12  . hydrOXYzine (VISTARIL) 100 MG capsule Take 100 mg by mouth 3 (three) times daily as needed for anxiety.       Marland Kitchen ibuprofen (ADVIL,MOTRIN) 800 MG tablet Take 1 tablet (800 mg total) by mouth 3 (three) times daily.  21 tablet  0  . insulin lispro (HUMALOG) 100 UNIT/ML injection Inject 2-8 Units into the skin daily. PT USES SLIDING SCALE  10 mL  3  . lamoTRIgine (LAMICTAL) 100 MG tablet Take 100 mg by mouth 2 (two) times daily.      Marland Kitchen LANTUS  100 UNIT/ML injection INJECT 25 UNITS INTO THE SKIN AT BEDTIME.  10 mL  5  . OVER THE COUNTER MEDICATION Apply 1 application to eye daily. Over the counter eye ointment.      Marland Kitchen OVER THE COUNTER MEDICATION Place 1 drop into both eyes 4 (four) times daily - after meals and at bedtime. Over the counter eye drops for moisture.      . trimethoprim-polymyxin b (POLYTRIM) ophthalmic solution Place 1 drop into the right eye every 4 (four) hours.  10 mL  0  . valACYclovir (VALTREX) 500 MG tablet TAKE 1 TABLET BY MOUTH EVERY DAY  30 tablet  1  . ziprasidone (GEODON) 80 MG capsule Take 80 mg by mouth at bedtime.        No current  facility-administered medications on file prior to visit.       Objective:   Physical Exam: BP 110/71  Pulse 48  Temp(Src) 97.3 F (36.3 C) (Oral)  Wt 169 lb (76.658 kg)  BMI 28.12 kg/m2   General: alert, well appearing, and in no distress Eyes: deferred Cardiovascular: sinus bradycardia with murmur Pulmonary: clear to auscultation bilaterally Feet: warm, good capillary refill and normal monofilament exam  Labs:   Diabetic Labs:  Lab Results  Component Value Date   HGBA1C 6.5 04/29/2013   HGBA1C 6.4 12/16/2012   HGBA1C 7.2 09/08/2012   Lab Results  Component Value Date   MICROALBUR 0.50 10/11/2009   LDLCALC 192* 09/18/2009   CREATININE 1.69* 09/08/2012   Last microalbumin: Lab Results  Component Value Date   MICROALBUR 0.50 10/11/2009        Assessment & Plan:   > 45 minutes spent in direct patient care

## 2013-05-26 NOTE — Assessment & Plan Note (Signed)
Assessment: patient with 8.5% CVD risk in 10 years and not on statin Plan: start lipitor 40 mg today

## 2013-05-27 LAB — BASIC METABOLIC PANEL WITH GFR
Calcium: 9.2 mg/dL (ref 8.4–10.5)
GFR, Est African American: 51 mL/min — ABNORMAL LOW
GFR, Est Non African American: 44 mL/min — ABNORMAL LOW
Sodium: 138 mEq/L (ref 135–145)

## 2013-06-09 ENCOUNTER — Telehealth: Payer: Self-pay

## 2013-06-09 ENCOUNTER — Other Ambulatory Visit: Payer: Self-pay | Admitting: Family Medicine

## 2013-06-09 DIAGNOSIS — R2242 Localized swelling, mass and lump, left lower limb: Secondary | ICD-10-CM

## 2013-06-09 NOTE — Telephone Encounter (Signed)
Patient was referred [facility] and states that they told her that they  do not do the "knee surgery". SHe will need a referral to another office. Please call.

## 2013-06-10 NOTE — Telephone Encounter (Signed)
Please call the patient and tell her that I will contact a surgeon at [facility] [place] directly. If they suggest that she go to an orthopedic surgeon, then I will make that referral. Thank her for her patience.

## 2013-06-10 NOTE — Telephone Encounter (Signed)
Called. Informed. .Monica [name]  

## 2013-06-13 NOTE — Telephone Encounter (Signed)
Please call the patient and inform her that I have made a referral to orthopedic surgery. She should hear something within 2 weeks regarding an appointment.

## 2013-06-13 NOTE — Addendum Note (Signed)
Addended by: Garnetta Buddy on: 06/13/2013 12:47 PM   Modules accepted: Orders

## 2013-06-13 NOTE — Telephone Encounter (Signed)
Pt informed.  Ahlani Wickes L, CMA  

## 2013-06-27 ENCOUNTER — Encounter: Payer: Self-pay | Admitting: Family Medicine

## 2013-06-27 ENCOUNTER — Ambulatory Visit (INDEPENDENT_AMBULATORY_CARE_PROVIDER_SITE_OTHER): Payer: Medicaid Other | Admitting: Family Medicine

## 2013-06-27 VITALS — BP 112/75 | HR 60 | Ht 65.0 in | Wt 172.0 lb

## 2013-06-27 DIAGNOSIS — E785 Hyperlipidemia, unspecified: Secondary | ICD-10-CM

## 2013-06-27 DIAGNOSIS — R229 Localized swelling, mass and lump, unspecified: Secondary | ICD-10-CM

## 2013-06-27 DIAGNOSIS — E109 Type 1 diabetes mellitus without complications: Secondary | ICD-10-CM

## 2013-06-27 NOTE — Patient Instructions (Signed)
Monica [name],   It was nice to see you today. I am most concerned about your diabetes since you still have really low and really high sugars the same day. This puts lots of stress on your body and also will delay wound healing after your surgery. Please discuss strategies with Dr. Raymondo Band. Please follow up with me after your surgery.  Sincerely,   Dr. Clinton Sawyer

## 2013-06-27 NOTE — Progress Notes (Signed)
  Subjective:    Patient ID: Monica [name], female    DOB: 06-14-1961, 52 y.o.   MRN: [id number]  HPI  52 year old F w/ DM, HTN, bipolar disorder, and chronic HCV who presents for follow up.   Hyperlipidemia:  At last visit, she was started on Lipitor 40 mg. Taking and tolerating medication without symptoms.    Left Knee Mass: Recently evaluated by Wendie Agreste, who is ordering MRI  Diabetes:  Pt last A1C 6.5, but very labile control. I am have been concerned about her inability to correct for sugars that are too high or too low. She has refused referrals to diabetes education, nutrition, and pharmacy. Today she notes her sugars have been 30-400s. She eats multiple packs of crackers and does not take her nightly insulin if sugars low in the afternoon which leads to high AM sugars.   Review of Systems See HPI    Objective:   Physical Exam BP 112/75  Pulse 60  Ht 5\' 5"  (1.651 m)  Wt 172 lb (78.019 kg)  BMI 28.62 kg/m2 Gen: middle age WF, non distressed CV: RRR, no murmurs Lungs: CTA-B Left knee: subcutaneous mass still palpable on left knee        Assessment & Plan:

## 2013-06-28 ENCOUNTER — Other Ambulatory Visit: Payer: Self-pay | Admitting: Family Medicine

## 2013-06-29 NOTE — Assessment & Plan Note (Signed)
A: persistent nodule P: work up via [employer]

## 2013-06-29 NOTE — Assessment & Plan Note (Addendum)
A: poorly controlled sugars P: Patient counseled by Dr. Raymondo Band , clinical pharmacist, today regarding strategies for stabilizing blood glucose; will follow with Dr. Raymondo Band

## 2013-06-29 NOTE — Assessment & Plan Note (Signed)
A: tolerating lipitor P: cont medication, recheck in 1 year

## 2013-07-08 ENCOUNTER — Other Ambulatory Visit: Payer: Self-pay | Admitting: Family Medicine

## 2013-07-13 HISTORY — PX: OTHER SURGICAL HISTORY: SHX169

## 2013-07-15 ENCOUNTER — Emergency Department (HOSPITAL_COMMUNITY): Payer: Medicaid Other

## 2013-07-15 ENCOUNTER — Encounter (HOSPITAL_COMMUNITY): Payer: Self-pay | Admitting: Emergency Medicine

## 2013-07-15 ENCOUNTER — Inpatient Hospital Stay (HOSPITAL_COMMUNITY)
Admission: EM | Admit: 2013-07-15 | Discharge: 2013-07-25 | DRG: 330 | Disposition: A | Discharge status: Home / Self Care | Payer: Medicaid Other | Attending: Family Medicine | Admitting: Family Medicine

## 2013-07-15 DIAGNOSIS — C189 Malignant neoplasm of colon, unspecified: Secondary | ICD-10-CM

## 2013-07-15 DIAGNOSIS — N183 Chronic kidney disease, stage 3 unspecified: Secondary | ICD-10-CM | POA: Diagnosis present

## 2013-07-15 DIAGNOSIS — K5669 Other intestinal obstruction: Secondary | ICD-10-CM | POA: Diagnosis present

## 2013-07-15 DIAGNOSIS — E876 Hypokalemia: Secondary | ICD-10-CM | POA: Diagnosis not present

## 2013-07-15 DIAGNOSIS — E109 Type 1 diabetes mellitus without complications: Secondary | ICD-10-CM

## 2013-07-15 DIAGNOSIS — C786 Secondary malignant neoplasm of retroperitoneum and peritoneum: Secondary | ICD-10-CM | POA: Diagnosis present

## 2013-07-15 DIAGNOSIS — Z794 Long term (current) use of insulin: Secondary | ICD-10-CM

## 2013-07-15 DIAGNOSIS — D126 Benign neoplasm of colon, unspecified: Secondary | ICD-10-CM | POA: Diagnosis present

## 2013-07-15 DIAGNOSIS — D72829 Elevated white blood cell count, unspecified: Secondary | ICD-10-CM | POA: Diagnosis not present

## 2013-07-15 DIAGNOSIS — E1049 Type 1 diabetes mellitus with other diabetic neurological complication: Secondary | ICD-10-CM | POA: Diagnosis present

## 2013-07-15 DIAGNOSIS — E663 Overweight: Secondary | ICD-10-CM

## 2013-07-15 DIAGNOSIS — C787 Secondary malignant neoplasm of liver and intrahepatic bile duct: Secondary | ICD-10-CM | POA: Diagnosis present

## 2013-07-15 DIAGNOSIS — B1920 Unspecified viral hepatitis C without hepatic coma: Secondary | ICD-10-CM | POA: Diagnosis present

## 2013-07-15 DIAGNOSIS — K819 Cholecystitis, unspecified: Secondary | ICD-10-CM

## 2013-07-15 DIAGNOSIS — E785 Hyperlipidemia, unspecified: Secondary | ICD-10-CM | POA: Diagnosis present

## 2013-07-15 DIAGNOSIS — E119 Type 2 diabetes mellitus without complications: Secondary | ICD-10-CM

## 2013-07-15 DIAGNOSIS — K5900 Constipation, unspecified: Secondary | ICD-10-CM | POA: Diagnosis present

## 2013-07-15 DIAGNOSIS — D500 Iron deficiency anemia secondary to blood loss (chronic): Secondary | ICD-10-CM | POA: Diagnosis not present

## 2013-07-15 DIAGNOSIS — K8010 Calculus of gallbladder with chronic cholecystitis without obstruction: Secondary | ICD-10-CM | POA: Diagnosis present

## 2013-07-15 DIAGNOSIS — C50919 Malignant neoplasm of unspecified site of unspecified female breast: Secondary | ICD-10-CM | POA: Diagnosis present

## 2013-07-15 DIAGNOSIS — C182 Malignant neoplasm of ascending colon: Principal | ICD-10-CM | POA: Diagnosis present

## 2013-07-15 DIAGNOSIS — F1120 Opioid dependence, uncomplicated: Secondary | ICD-10-CM | POA: Diagnosis present

## 2013-07-15 DIAGNOSIS — Z79899 Other long term (current) drug therapy: Secondary | ICD-10-CM

## 2013-07-15 DIAGNOSIS — C7982 Secondary malignant neoplasm of genital organs: Secondary | ICD-10-CM | POA: Diagnosis present

## 2013-07-15 DIAGNOSIS — D490 Neoplasm of unspecified behavior of digestive system: Secondary | ICD-10-CM

## 2013-07-15 DIAGNOSIS — I10 Essential (primary) hypertension: Secondary | ICD-10-CM

## 2013-07-15 DIAGNOSIS — E1069 Type 1 diabetes mellitus with other specified complication: Secondary | ICD-10-CM | POA: Diagnosis not present

## 2013-07-15 DIAGNOSIS — F17200 Nicotine dependence, unspecified, uncomplicated: Secondary | ICD-10-CM

## 2013-07-15 DIAGNOSIS — F319 Bipolar disorder, unspecified: Secondary | ICD-10-CM

## 2013-07-15 DIAGNOSIS — K6389 Other specified diseases of intestine: Secondary | ICD-10-CM | POA: Diagnosis present

## 2013-07-15 DIAGNOSIS — R109 Unspecified abdominal pain: Secondary | ICD-10-CM

## 2013-07-15 DIAGNOSIS — C7960 Secondary malignant neoplasm of unspecified ovary: Secondary | ICD-10-CM | POA: Diagnosis present

## 2013-07-15 DIAGNOSIS — F1310 Sedative, hypnotic or anxiolytic abuse, uncomplicated: Secondary | ICD-10-CM | POA: Diagnosis present

## 2013-07-15 DIAGNOSIS — E1142 Type 2 diabetes mellitus with diabetic polyneuropathy: Secondary | ICD-10-CM | POA: Diagnosis present

## 2013-07-15 DIAGNOSIS — F411 Generalized anxiety disorder: Secondary | ICD-10-CM | POA: Diagnosis present

## 2013-07-15 DIAGNOSIS — I129 Hypertensive chronic kidney disease with stage 1 through stage 4 chronic kidney disease, or unspecified chronic kidney disease: Secondary | ICD-10-CM | POA: Diagnosis present

## 2013-07-15 LAB — COMPREHENSIVE METABOLIC PANEL
ALT: 15 U/L (ref 0–35)
Alkaline Phosphatase: 131 U/L — ABNORMAL HIGH (ref 39–117)
CO2: 27 mEq/L (ref 19–32)
GFR calc Af Amer: 59 mL/min — ABNORMAL LOW (ref >90)
Glucose, Bld: 186 mg/dL — ABNORMAL HIGH (ref 70–99)
Potassium: 3.8 mEq/L (ref 3.5–5.1)
Sodium: 143 mEq/L (ref 135–145)
Total Protein: 7.9 g/dL (ref 6.0–8.3)

## 2013-07-15 LAB — CBC WITH DIFFERENTIAL/PLATELET
Eosinophils Absolute: 0.1 10*3/uL (ref 0.0–0.7)
Lymphocytes Relative: 14 % (ref 12–46)
Lymphs Abs: 1.4 10*3/uL (ref 0.7–4.0)
Neutro Abs: 7.8 10*3/uL — ABNORMAL HIGH (ref 1.7–7.7)
Neutrophils Relative %: 79 % — ABNORMAL HIGH (ref 43–77)
Platelets: 245 10*3/uL (ref 150–400)
RBC: 5.43 MIL/uL — ABNORMAL HIGH (ref 3.87–5.11)
WBC: 9.9 10*3/uL (ref 4.0–10.5)

## 2013-07-15 LAB — URINALYSIS, ROUTINE W REFLEX MICROSCOPIC
Bilirubin Urine: NEGATIVE
Ketones, ur: NEGATIVE mg/dL
Nitrite: NEGATIVE
pH: 7 (ref 5.0–8.0)

## 2013-07-15 MED ORDER — MORPHINE SULFATE 4 MG/ML IJ SOLN
6.0000 mg | Freq: Once | INTRAMUSCULAR | Status: AC
  Administered 2013-07-15: 6 mg via INTRAMUSCULAR
  Filled 2013-07-15: qty 2

## 2013-07-15 MED ORDER — SODIUM CHLORIDE 0.9 % IV BOLUS (SEPSIS): 1000.0000 mL | Freq: Once | INTRAVENOUS | Status: DC

## 2013-07-15 MED ORDER — ONDANSETRON HCL 4 MG/2ML IJ SOLN: 4.0000 mg | Freq: Once | INTRAMUSCULAR | Status: DC

## 2013-07-15 MED ORDER — IOHEXOL 300 MG/ML  SOLN: 25.0000 mL | INTRAMUSCULAR | Status: AC

## 2013-07-15 MED ORDER — MORPHINE SULFATE 4 MG/ML IJ SOLN: 6.0000 mg | Freq: Once | INTRAMUSCULAR | Status: DC

## 2013-07-15 NOTE — ED Notes (Signed)
Two nurses attempted IV. IV team paged  

## 2013-07-15 NOTE — ED Provider Notes (Signed)
CSN: [id number]     Arrival date & time 07/15/13  1409 History   First MD Initiated Contact with Patient 07/15/13 1619     Chief Complaint  Patient presents with  . Abdominal Pain   (Consider location/radiation/quality/duration/timing/severity/associated sxs/prior Treatment) HPI Patient is a 52 year old female with a PMH of hepatitis C, DM1, HTN, CKD, and substance abuse who presents to the Emergency Department complaining of abdominal pain x 2 days. Patient states that the pain is a constant cramping and stabbing pain located on the right side of her abdomen and suprapubic region. Patient rates the pain as a 10/10 in severity. She reports nothing makes the pain better or worse. She took two oxycodone 5mg  without relief prior to arrival. She reports that her last BM was today. She denies fever, nausea, vomiting, diarrhea, hematochezia, melena, chest pain, cough, SOB, dysuria, and urinary frequency. Patient denies alcohol and drug use.   Past Medical History  Diagnosis Date  . Diabetes mellitus   . Bipolar disorder   . Depression   . Hepatitis C   . Psychotic episode   . CORNEAL ABRASION, RIGHT 08/20/2010    Qualifier: Diagnosis of  By: Daphine Deutscher FNP, Zena Amos     Past Surgical History  Procedure Laterality Date  . Cesarean section     History reviewed. No pertinent family history. History  Substance Use Topics  . Smoking status: Current Every Day Smoker -- 1.00 packs/day    Types: Cigarettes  . Smokeless tobacco: Never Used  . Alcohol Use: No   OB History   Grav Para Term Preterm Abortions TAB SAB Ect Mult Living                 Review of Systems All other systems negative except as documented in the HPI. All pertinent positives and negatives as reviewed in the HPI. Allergies  Review of patient's allergies indicates no known allergies.  Home Medications   Current Outpatient Rx  Name  Route  Sig  Dispense  Refill  . ACCU-CHEK AVIVA PLUS test strip      USE AS DIRECTED  100 each   10   . atorvastatin (LIPITOR) 40 MG tablet   Oral   Take 1 tablet (40 mg total) by mouth daily.   90 tablet   3   . clonazePAM (KLONOPIN) 1 MG tablet   Oral   Take 1 mg by mouth 3 (three) times daily as needed for anxiety.          . furosemide (LASIX) 40 MG tablet   Oral   Take 1 tablet (40 mg total) by mouth 2 (two) times daily.   60 tablet   5   . gabapentin (NEURONTIN) 800 MG tablet   Oral   Take 800 mg by mouth 2 (two) times daily.         . hydrOXYzine (VISTARIL) 100 MG capsule   Oral   Take 100 mg by mouth 3 (three) times daily as needed for anxiety.          Marland Kitchen ibuprofen (ADVIL,MOTRIN) 800 MG tablet   Oral   Take 1 tablet (800 mg total) by mouth 3 (three) times daily.   21 tablet   0   . insulin glargine (LANTUS) 100 UNIT/ML injection   Subcutaneous   Inject 20 Units into the skin daily.         . insulin lispro (HUMALOG) 100 UNIT/ML injection   Subcutaneous   Inject 2-8 Units into  the skin daily. PT USES SLIDING SCALE   10 mL   3   . lamoTRIgine (LAMICTAL) 100 MG tablet   Oral   Take 100 mg by mouth 2 (two) times daily.         Marland Kitchen omeprazole (PRILOSEC) 20 MG capsule   Oral   Take 20 mg by mouth daily.         Marland Kitchen oxyCODONE-acetaminophen (PERCOCET/ROXICET) 5-325 MG per tablet   Oral   Take 1 tablet by mouth every 4 (four) hours as needed for severe pain.         . polyethylene glycol (MIRALAX / GLYCOLAX) packet   Oral   Take 17 g by mouth daily as needed.         . valACYclovir (VALTREX) 500 MG tablet   Oral   Take 500 mg by mouth daily.         . ziprasidone (GEODON) 80 MG capsule   Oral   Take 80 mg by mouth at bedtime.           BP 119/75  Pulse 72  Temp(Src) 98.8 F (37.1 C) (Oral)  Resp 20  SpO2 96% Physical Exam  Nursing note and vitals reviewed. Constitutional: She is oriented to person, place, and time. She appears well-developed and well-nourished. No distress.  HENT:  Head: Normocephalic and  atraumatic.  Eyes: Pupils are equal, round, and reactive to light.  Neck: Normal range of motion. Neck supple.  Cardiovascular: Normal rate, regular rhythm and normal heart sounds.  Exam reveals no gallop and no friction rub.   No murmur heard. Pulmonary/Chest: Breath sounds normal. No respiratory distress. She has no wheezes. She has no rales.  Poor effort on exam.   Abdominal: Soft. Bowel sounds are normal. She exhibits no distension. There is tenderness (Diffusely tender to all four quadrants and suprapubic region.). There is no rebound and no guarding.  Neurological: She is alert and oriented to person, place, and time.  Skin: Skin is warm and dry.    ED Course  Procedures (including critical care time) Labs Review Labs Reviewed  CBC WITH DIFFERENTIAL - Abnormal; Notable for the following:    RBC 5.43 (*)    Hemoglobin 16.0 (*)    Neutrophils Relative % 79 (*)    Neutro Abs 7.8 (*)    All other components within normal limits  COMPREHENSIVE METABOLIC PANEL - Abnormal; Notable for the following:    Glucose, Bld 186 (*)    Creatinine, Ser 1.21 (*)    Alkaline Phosphatase 131 (*)    GFR calc non Af Amer 51 (*)    GFR calc Af Amer 59 (*)    All other components within normal limits  URINE CULTURE  URINALYSIS, ROUTINE W REFLEX MICROSCOPIC   Patient be admitted to the hospital for findings on her CT scan indicated possible malignancy causing obstruction of her bowel.  I spoke with Gen. surgery and gastroenterology.  I spoke with her primary care doctor, who will admit the patient to the hospital.  Patient is given the results, and plan   Carlyle Dolly, PA-C 07/17/13 2350

## 2013-07-15 NOTE — ED Notes (Signed)
General surgery at bedside. 

## 2013-07-15 NOTE — ED Notes (Signed)
PT made aware that she will only do oral contrast so she will wait two hours for CT scan. IV team at bedside starting IV for fluids.

## 2013-07-15 NOTE — ED Notes (Addendum)
PT reports having knee surgery 3 days ago. Pt now reports RLQ pain since yesterday, progressively worsening. Pt has generalized tenderness on palpation, feels firm. Bowel sounds present x 4. Denies N/V/D. Normal BM today.

## 2013-07-15 NOTE — Consult Note (Signed)
Reason for Consult:Right colon mass; possible cholecystitis Referring Physician: Effie Shy, EDP  Monica [name] is an 52 y.o. female.  HPI: 52 yo female presents with right-sided abdominal pain x 2 days.  She denies any nausea, vomiting, diarrhea, or urinary symptoms.  No blood in stool.  She reports many months of gradual abdominal distention and difficulty with bowel movements.  She had a bowel movement this morning.  She denies any gross blood in her bowel movements.  She has never had a colonoscopy.    Past Medical History  Diagnosis Date  . Diabetes mellitus   . Bipolar disorder   . Depression   . Hepatitis C   . Psychotic episode   . CORNEAL ABRASION, RIGHT 08/20/2010    Qualifier: Diagnosis of  By: Daphine Deutscher FNP, Zena Amos      Past Surgical History  Procedure Laterality Date  . Cesarean section      History reviewed. No pertinent family history.  Social History:  reports that she has been smoking Cigarettes.  She has been smoking about 1.00 pack per day. She has never used smokeless tobacco. She reports that she does not drink alcohol or use illicit drugs.  Allergies: No Known Allergies  Medications:   Prior to Admission medications   Medication Sig Start Date End Date Taking? Authorizing Provider  ACCU-CHEK AVIVA PLUS test strip USE AS DIRECTED 07/08/13  Yes Garnetta Buddy, MD  atorvastatin (LIPITOR) 40 MG tablet Take 1 tablet (40 mg total) by mouth daily. 05/26/13  Yes Garnetta Buddy, MD  clonazePAM (KLONOPIN) 1 MG tablet Take 1 mg by mouth 3 (three) times daily as needed for anxiety.    Yes Historical Provider, MD  furosemide (LASIX) 40 MG tablet Take 1 tablet (40 mg total) by mouth 2 (two) times daily. 09/28/12  Yes Garnetta Buddy, MD  gabapentin (NEURONTIN) 800 MG tablet Take 800 mg by mouth 2 (two) times daily. 12/16/12  Yes Garnetta Buddy, MD  hydrOXYzine (VISTARIL) 100 MG capsule Take 100 mg by mouth 3 (three) times daily as needed for anxiety.    Yes  Historical Provider, MD  ibuprofen (ADVIL,MOTRIN) 800 MG tablet Take 1 tablet (800 mg total) by mouth 3 (three) times daily. 12/29/12  Yes Roxy Horseman, PA-C  insulin glargine (LANTUS) 100 UNIT/ML injection Inject 20 Units into the skin daily.   Yes Historical Provider, MD  insulin lispro (HUMALOG) 100 UNIT/ML injection Inject 2-8 Units into the skin daily. PT USES SLIDING SCALE 06/10/12  Yes Garnetta Buddy, MD  lamoTRIgine (LAMICTAL) 100 MG tablet Take 100 mg by mouth 2 (two) times daily.   Yes Historical Provider, MD  omeprazole (PRILOSEC) 20 MG capsule Take 20 mg by mouth daily.   Yes Historical Provider, MD  oxyCODONE-acetaminophen (PERCOCET/ROXICET) 5-325 MG per tablet Take 1 tablet by mouth every 4 (four) hours as needed for severe pain.   Yes Historical Provider, MD  polyethylene glycol (MIRALAX / GLYCOLAX) packet Take 17 g by mouth daily as needed.   Yes Historical Provider, MD  valACYclovir (VALTREX) 500 MG tablet Take 500 mg by mouth daily.   Yes Historical Provider, MD  ziprasidone (GEODON) 80 MG capsule Take 80 mg by mouth at bedtime.    Yes Historical Provider, MD     Results for orders placed during the hospital encounter of 07/15/13 (from the past 48 hour(s))  CBC WITH DIFFERENTIAL     Status: Abnormal   Collection Time    07/15/13  3:18 PM  Result Value Range   WBC 9.9  4.0 - 10.5 K/uL   RBC 5.43 (*) 3.87 - 5.11 MIL/uL   Hemoglobin 16.0 (*) 12.0 - 15.0 g/dL   HCT 40.9  81.1 - 91.4 %   MCV 84.2  78.0 - 100.0 fL   MCH 29.5  26.0 - 34.0 pg   MCHC 35.0  30.0 - 36.0 g/dL   RDW 78.2  95.6 - 21.3 %   Platelets 245  150 - 400 K/uL   Neutrophils Relative % 79 (*) 43 - 77 %   Neutro Abs 7.8 (*) 1.7 - 7.7 K/uL   Lymphocytes Relative 14  12 - 46 %   Lymphs Abs 1.4  0.7 - 4.0 K/uL   Monocytes Relative 6  3 - 12 %   Monocytes Absolute 0.5  0.1 - 1.0 K/uL   Eosinophils Relative 1  0 - 5 %   Eosinophils Absolute 0.1  0.0 - 0.7 K/uL   Basophils Relative 0  0 - 1 %    Basophils Absolute 0.0  0.0 - 0.1 K/uL  COMPREHENSIVE METABOLIC PANEL     Status: Abnormal   Collection Time    07/15/13  3:18 PM      Result Value Range   Sodium 143  135 - 145 mEq/L   Potassium 3.8  3.5 - 5.1 mEq/L   Chloride 103  96 - 112 mEq/L   CO2 27  19 - 32 mEq/L   Glucose, Bld 186 (*) 70 - 99 mg/dL   BUN 14  6 - 23 mg/dL   Creatinine, Ser 0.86 (*) 0.50 - 1.10 mg/dL   Calcium 9.4  8.4 - 57.8 mg/dL   Total Protein 7.9  6.0 - 8.3 g/dL   Albumin 3.6  3.5 - 5.2 g/dL   AST 18  0 - 37 U/L   ALT 15  0 - 35 U/L   Alkaline Phosphatase 131 (*) 39 - 117 U/L   Total Bilirubin 0.4  0.3 - 1.2 mg/dL   GFR calc non Af Amer 51 (*) >90 mL/min   GFR calc Af Amer 59 (*) >90 mL/min   Comment: (NOTE)     The eGFR has been calculated using the CKD EPI equation.     This calculation has not been validated in all clinical situations.     eGFR's persistently <90 mL/min signify possible Chronic Kidney     Disease.  URINALYSIS, ROUTINE W REFLEX MICROSCOPIC     Status: None   Collection Time    07/15/13  6:34 PM      Result Value Range   Color, Urine YELLOW  YELLOW   APPearance CLEAR  CLEAR   Specific Gravity, Urine 1.019  1.005 - 1.030   pH 7.0  5.0 - 8.0   Glucose, UA NEGATIVE  NEGATIVE mg/dL   Hgb urine dipstick NEGATIVE  NEGATIVE   Bilirubin Urine NEGATIVE  NEGATIVE   Ketones, ur NEGATIVE  NEGATIVE mg/dL   Protein, ur NEGATIVE  NEGATIVE mg/dL   Urobilinogen, UA 0.2  0.0 - 1.0 mg/dL   Nitrite NEGATIVE  NEGATIVE   Leukocytes, UA NEGATIVE  NEGATIVE   Comment: MICROSCOPIC NOT DONE ON URINES WITH NEGATIVE PROTEIN, BLOOD, LEUKOCYTES, NITRITE, OR GLUCOSE <1000 mg/dL.    Ct Abdomen Pelvis Wo Contrast  07/15/2013   CLINICAL DATA:  Lower abdominal pain  EXAM: CT ABDOMEN AND PELVIS WITHOUT CONTRAST  TECHNIQUE: Multidetector CT imaging of the abdomen and pelvis was performed following the standard protocol  without intravenous contrast.  COMPARISON:  None.  FINDINGS: The lung bases are free of acute  infiltrate or sizable effusion.  The liver, spleen, adrenal glands and pancreas are all normal in their CT appearance. The gallbladder is distended and there is prominence of the common bile duct to 1 cm. No definitive obstructive calculus is seen. Correlation with laboratory values is recommended. The kidneys are well visualized without renal calculi or urinary tract obstructive changes.  In the right lower quadrant there is circumferential colonic wall thickening suspicious for a colonic carcinoma. Proximal to this, the cecum is dilated with significant amount of fecal material as well as dilatation with fecal material of the distal terminal ileum. Fecal material is noted within the more distal colon  Bladder is well distended. No pelvic mass lesion is seen. A minimal amount of ascites is noted. No definitive peritoneal implantation is noted. The osseous structures show no acute abnormality.  IMPRESSION: Changes in the right lower quadrant consistent with a colon carcinoma with circumferential wall thickening and a degree of obstructive change with dilatation of the cecum and distal terminal ileum with significant to the fecal material. Further evaluation by means of colonoscopy is recommended. A mild amount of ascites is noted.  Dilatation of the gallbladder with dilation of the common bile duct 1 cm as described. This is of uncertain etiology. Clinical correlation with laboratory values is recommended.   Electronically Signed   By: Alcide Clever M.D.   On: 07/15/2013 19:53   US Abdomen Complete  07/15/2013   CLINICAL DATA:  Lower abdominal pain.  Distended gallbladder.  EXAM: ULTRASOUND ABDOMEN COMPLETE  COMPARISON:  CT abdomen and pelvis earlier today.  FINDINGS: Gallbladder:  Sludge is present within the gallbladder without visible stones. Gallbladder wall thickness 3 mm. Positive sonographic Murphy's sign.  Common bile duct:  Diameter: Increased at 7-10 mm.  Liver:  No focal lesion identified.  Hepatic  steatosis.  IVC:  No abnormality visualized.  Pancreas:  Visualized portion unremarkable.  Spleen:  Size and appearance within normal limits.  Right Kidney:  Length: 9.5 cm. Mild right perinephric fluid of uncertain significance. No stones or obstruction. Echogenicity within normal limits. No mass or hydronephrosis visualized.  Left Kidney:  Length: 10.3 cm. Limited visualization due to gas. No definite obstruction. Echogenicity within normal limits. No mass or hydronephrosis visualized.  Abdominal aorta:  No aneurysm visualized.  Other findings:  Marked colonic air and stool.  IMPRESSION: Gallbladder sludge with positive sonographic Murphy's sign. No calculi are observed. Acalculous cholecystitis not excluded. Extrahepatic biliary ductal dilatation is also present.   Electronically Signed   By: Davonna Belling M.D.   On: 07/15/2013 21:40    Review of Systems  Constitutional: Negative for weight loss.  HENT: Negative for ear discharge, ear pain, hearing loss and tinnitus.   Eyes: Negative for blurred vision, double vision, photophobia and pain.  Respiratory: Negative for cough, sputum production and shortness of breath.   Cardiovascular: Negative for chest pain.  Gastrointestinal: Positive for nausea, abdominal pain and constipation. Negative for vomiting.       Abdominal distention   Genitourinary: Negative for dysuria, urgency, frequency and flank pain.  Musculoskeletal: Negative for back pain, falls, joint pain, myalgias and neck pain.  Neurological: Negative for dizziness, tingling, sensory change, focal weakness, loss of consciousness and headaches.  Endo/Heme/Allergies: Does not bruise/bleed easily.  Psychiatric/Behavioral: Negative for depression, memory loss and substance abuse. The patient is not nervous/anxious.    Blood pressure 138/77, pulse  78, temperature 99.5 F (37.5 C), temperature source Oral, resp. rate 20, height 5\' 5"  (1.651 m), weight 163 lb (73.936 kg), SpO2 89.00%. Physical  Exam WDWN in NAD HEENT:  EOMI, sclera anicteric Neck:  No masses, no thyromegaly Lungs:  CTA bilaterally; normal respiratory effort CV:  Regular rate and rhythm; no murmurs Abd:  Distended; umbilical hernia - soft and reducible; tender RUQ and RLQ Ext:  Well-perfused; no edema Skin:  Warm, dry; no sign of jaundice  Assessment/Plan: 1.  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  Gallbladder sludge with some right-sided abdominal tenderness - the tenderness is likely from her dilated cecum and terminal ileum.  Recs:  Admit to [facility] for IV hydration, further work-up Would keep her NPO x ice chips for now Recommend immediate GI consult for colonoscopy tomorrow to help establish the diagnosis of colon cancer; would also ask them to evaluate the dilated CBD. Will likely be ready for surgery early in the week - both cholecystectomy and right hemicolectomy.  If GI is able to scope her this weekend, please keep her on clear liquids so she doesn't have to have another bowel prep. Will follow with you.   Arthelia Callicott K. 07/15/2013, 10:03 PM

## 2013-07-15 NOTE — ED Notes (Signed)
Pt c/o lower abd pain x 2 days; pt denies N/V/D

## 2013-07-15 NOTE — ED Notes (Signed)
Based off of CT results, pt will need IV. IV team repaged.

## 2013-07-15 NOTE — ED Notes (Signed)
Pt did not answer.

## 2013-07-15 NOTE — H&P (Signed)
Family Medicine Teaching [facility] Admission History and Physical Service Pager: [phone]  Patient name: Monica [name] Medical record number: [id number] Date of birth: May 04, 1961 Age: 52 y.o. Gender: female  Primary Care Provider: Mat Carne, MD Consultants: General Surgery, GI Code Status: Full  Chief Complaint: Abdominal Pain  Assessment and Plan: Monica CALIXTO is a 52 y.o. female presenting with severe right sided abdominal pain with large bowel obstruction from a mass at the ileocecal junction as well as acalculous cholecystitis. PMH is significant for Type 1 DM, bipolar disorder, hypertension, hepatitis C, history IV drug use, and recent knee surgery.   # Bowel obstruction due to colon mass - presumed carcinoma based upon CT and pt without history colon cancer screening - Keep NPO - Morphine 4 mg IV q 3 PRN pain - Consult GI in the morning as pt likely to need colonoscopy  - General surgery, Dr. Corliss Skains, already following and planning for likely colectomy in a few days  # Acalculous cholecystitis - sludge and gall bladder wall thickening seen on u/s - General surgery, Dr. Corliss Skains, plan for cholecystectomy while performing colectomy if needed - Morphine 4 mg IV q 3 PRN pain  # Type 1 DM - pt is very brittle diabetes - Decrease Lantus dose to 15 QAM and sensitive SSI while NPO  # Left knee surgery - pt 2 days s/p resection of fatty tumor from anterior left knee, no evidence of infection or post-operative complication - Keep dressing in place with steri-strips   # Bipolar Disorder/Anxiety  - Continue home geodon, lamictal, clonazepam, atarax, and ramelteon  # Nicotine Abuse - smokes 1 ppd - Nicotine patch 14 gm  # HLD - hold home atorvastatin   FEN/GI: NS @ 100 ml/hr; NPO except meds and ice chips Prophylaxis: lovenox subcutaneous   Disposition: Admit to Family Medicine teaching service   History of Present Illness: Monica [name] is a 52 y.o.  female presenting with severe abdominal pain of 2 days duration. It has been worsening and described as worst pain of her life. It is accompanied by nausea and vomiting. She last had a bowel movement yesterday, and denies blood in her BM. She is not able to pass gas at this time. She presented to the ED and an abdominal CT showed a mass at the ileo-cecal junction causing obstruction, which is concerning for carcinoma. The patient does not have a history of colon cancer screening and denies any family history of colon cancer. She also had an ultrasound that demonstrated acalculous cholecystitis. She was evaluated by Dr. Corliss Skains, general surgeon, in the ED, who recommended GI consult over the weekend with likely surgery next week.    Review Of Systems: Per HPI with the following additions: none Otherwise 12 point review of systems was performed and was unremarkable.  Patient Active Problem List   Diagnosis Date Noted  . Severe diabetic hypoglycemia 12/17/2012    Priority: High  . Polypharmacy 12/17/2012    Priority: High  . Benzodiazepine abuse 09/21/2011    Priority: High  . DIABETES MELLITUS, TYPE I, ADULT ONSET 04/07/2008    Priority: High  . BIPOLAR AFFECTIVE DISORDER 04/04/2008    Priority: High  . SUBSTANCE ABUSE, MULTIPLE 04/04/2008    Priority: High  . Recurrent corneal erosion 11/29/2012    Priority: Medium  . Bilateral knee pain 09/09/2012    Priority: Medium  . Opioid dependence 09/21/2011    Priority: Medium  . HYPERLIPIDEMIA 08/20/2010    Priority: Medium  .  CKD (chronic kidney disease), stage III 03/30/2009    Priority: Medium  . 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 HEPATITIS C 04/04/2008    Priority: Medium  . TOBACCO ABUSE 04/04/2008    Priority: Medium  . OPIOID DEPENDENCE 03/14/2008    Priority: Medium  . PERIMENOPAUSAL STATUS 10/11/2010    Priority: Low  . LYMPHADENOPATHY 08/20/2010    Priority: Low  . Overweight (BMI 25.0-29.9) 06/22/2009    Priority: Low   . Edema 08/15/2008    Priority: Low  . ANEMIA, CHRONIC 04/04/2008    Priority: Low  . HYPERTENSION, BENIGN ESSENTIAL 04/04/2008    Priority: Low  . Colon tumor 07/15/2013  . Constipation 05/26/2013  . Subcutaneous nodule of anterior left knee 05/01/2013   Past Medical History: Past Medical History  Diagnosis Date  . Diabetes mellitus   . Bipolar disorder   . Depression   . Hepatitis C   . Psychotic episode   . CORNEAL ABRASION, RIGHT 08/20/2010    Qualifier: Diagnosis of  By: Daphine Deutscher FNP, Zena Amos     Past Surgical History: Past Surgical History  Procedure Laterality Date  . Cesarean section     Social History: History  Substance Use Topics  . Smoking status: Current Every Day Smoker -- 1.00 packs/day    Types: Cigarettes  . Smokeless tobacco: Never Used  . Alcohol Use: No   Additional social history: pt lives with her mother   Please also refer to relevant sections of EMR.  Family History: History reviewed. No pertinent family history. Allergies and Medications: No Known Allergies No current facility-administered medications on file prior to encounter.   Current Outpatient Prescriptions on File Prior to Encounter  Medication Sig Dispense Refill  . ACCU-CHEK AVIVA PLUS test strip USE AS DIRECTED  100 each  10  . atorvastatin (LIPITOR) 40 MG tablet Take 1 tablet (40 mg total) by mouth daily.  90 tablet  3  . clonazePAM (KLONOPIN) 1 MG tablet Take 1 mg by mouth 3 (three) times daily as needed for anxiety.       . furosemide (LASIX) 40 MG tablet Take 1 tablet (40 mg total) by mouth 2 (two) times daily.  60 tablet  5  . gabapentin (NEURONTIN) 800 MG tablet Take 800 mg by mouth 2 (two) times daily.      . hydrOXYzine (VISTARIL) 100 MG capsule Take 100 mg by mouth 3 (three) times daily as needed for anxiety.       Marland Kitchen ibuprofen (ADVIL,MOTRIN) 800 MG tablet Take 1 tablet (800 mg total) by mouth 3 (three) times daily.  21 tablet  0  . insulin lispro (HUMALOG) 100 UNIT/ML  injection Inject 2-8 Units into the skin daily. PT USES SLIDING SCALE  10 mL  3  . lamoTRIgine (LAMICTAL) 100 MG tablet Take 100 mg by mouth 2 (two) times daily.      . ziprasidone (GEODON) 80 MG capsule Take 80 mg by mouth at bedtime.         Objective: BP 129/76  Pulse 82  Temp(Src) 99.5 F (37.5 C) (Oral)  Resp 20  Ht 5\' 5"  (1.651 m)  Wt 163 lb (73.936 kg)  BMI 27.12 kg/m2  SpO2 94% Exam: General: middle aged female, distressed appearing, conversant HEENT: NCAT, EOMI, PERRLA, OP dry Cardiovascular: RRR, no murmurs Respiratory: normal WOB, CTA-B Abdomen: distended but marked tenderness of RUQ and RLQ, no guarding, hypoactive bowel sounds  Extremities: left knee with dressing that was taken down and  demonstrated well healing vertical incision sutured with steri-strips over that did not demonstrate cellulitis or dehiscence  Skin: no rashes Neuro: alert, oriented, no focal deficits   Labs and Imaging: CBC BMET   Recent Labs Lab 07/15/13 1518  WBC 9.9  HGB 16.0*  HCT 45.7  PLT 245    Recent Labs Lab 07/15/13 1518  NA 143  K 3.8  CL 103  CO2 27  BUN 14  CREATININE 1.21*  GLUCOSE 186*  CALCIUM 9.4     Ct Abdomen Pelvis Wo Contrast  07/15/2013   CLINICAL DATA:  Lower abdominal pain  EXAM: CT ABDOMEN AND PELVIS WITHOUT CONTRAST  TECHNIQUE: Multidetector CT imaging of the abdomen and pelvis was performed following the standard protocol without intravenous contrast.  COMPARISON:  None.  FINDINGS: The lung bases are free of acute infiltrate or sizable effusion.  The liver, spleen, adrenal glands and pancreas are all normal in their CT appearance. The gallbladder is distended and there is prominence of the common bile duct to 1 cm. No definitive obstructive calculus is seen. Correlation with laboratory values is recommended. The kidneys are well visualized without renal calculi or urinary tract obstructive changes.  In the right lower quadrant there is circumferential colonic  wall thickening suspicious for a colonic carcinoma. Proximal to this, the cecum is dilated with significant amount of fecal material as well as dilatation with fecal material of the distal terminal ileum. Fecal material is noted within the more distal colon  Bladder is well distended. No pelvic mass lesion is seen. A minimal amount of ascites is noted. No definitive peritoneal implantation is noted. The osseous structures show no acute abnormality.  IMPRESSION: Changes in the right lower quadrant consistent with a colon carcinoma with circumferential wall thickening and a degree of obstructive change with dilatation of the cecum and distal terminal ileum with significant to the fecal material. Further evaluation by means of colonoscopy is recommended. A mild amount of ascites is noted.  Dilatation of the gallbladder with dilation of the common bile duct 1 cm as described. This is of uncertain etiology. Clinical correlation with laboratory values is recommended.   Electronically Signed   By: Alcide Clever M.D.   On: 07/15/2013 19:53   US Abdomen Complete  07/15/2013   CLINICAL DATA:  Lower abdominal pain.  Distended gallbladder.  EXAM: ULTRASOUND ABDOMEN COMPLETE  COMPARISON:  CT abdomen and pelvis earlier today.  FINDINGS: Gallbladder:  Sludge is present within the gallbladder without visible stones. Gallbladder wall thickness 3 mm. Positive sonographic Murphy's sign.  Common bile duct:  Diameter: Increased at 7-10 mm.  Liver:  No focal lesion identified.  Hepatic steatosis.  IVC:  No abnormality visualized.  Pancreas:  Visualized portion unremarkable.  Spleen:  Size and appearance within normal limits.  Right Kidney:  Length: 9.5 cm. Mild right perinephric fluid of uncertain significance. No stones or obstruction. Echogenicity within normal limits. No mass or hydronephrosis visualized.  Left Kidney:  Length: 10.3 cm. Limited visualization due to gas. No definite obstruction. Echogenicity within normal limits. No  mass or hydronephrosis visualized.  Abdominal aorta:  No aneurysm visualized.  Other findings:  Marked colonic air and stool.  IMPRESSION: Gallbladder sludge with positive sonographic Murphy's sign. No calculi are observed. Acalculous cholecystitis not excluded. Extrahepatic biliary ductal dilatation is also present.   Electronically Signed   By: Davonna Belling M.D.   On: 07/15/2013 21:40   Ct Abdomen Pelvis Wo Contrast  07/15/2013  IMPRESSION: Changes in the  right lower quadrant consistent with a colon carcinoma with circumferential wall thickening and a degree of obstructive change with dilatation of the cecum and distal terminal ileum with significant to the fecal material. Further evaluation by means of colonoscopy is recommended. A mild amount of ascites is noted.  Dilatation of the gallbladder with dilation of the common bile duct 1 cm as described. This is of uncertain etiology. Clinical correlation with laboratory values is recommended.   Electronically Signed   By: Alcide Clever M.D.   On: 07/15/2013 19:53   US Abdomen Complete  07/15/2013   IMPRESSION: Gallbladder sludge with positive sonographic Murphy's sign. No calculi are observed. Acalculous cholecystitis not excluded. Extrahepatic biliary ductal dilatation is also present.   Electronically Signed   By: Davonna Belling M.D.   On: 07/15/2013 21:40     Garnetta Buddy, MD 07/15/2013, 10:47 PM PGY-3, [place] [facility] FPTS Intern pager: [phone], text pages welcome

## 2013-07-15 NOTE — ED Notes (Signed)
IV team at bedside 

## 2013-07-15 NOTE — ED Notes (Signed)
Attempted to call report x 1  

## 2013-07-15 NOTE — ED Notes (Signed)
IV team unable to start line. Pt not willing for second attempt. PA Lawyer made aware.

## 2013-07-15 NOTE — ED Notes (Signed)
Patient returned from CT

## 2013-07-16 ENCOUNTER — Encounter (HOSPITAL_COMMUNITY): Payer: Self-pay | Admitting: *Deleted

## 2013-07-16 DIAGNOSIS — D490 Neoplasm of unspecified behavior of digestive system: Secondary | ICD-10-CM

## 2013-07-16 DIAGNOSIS — K6389 Other specified diseases of intestine: Secondary | ICD-10-CM

## 2013-07-16 DIAGNOSIS — R109 Unspecified abdominal pain: Secondary | ICD-10-CM

## 2013-07-16 DIAGNOSIS — I10 Essential (primary) hypertension: Secondary | ICD-10-CM

## 2013-07-16 DIAGNOSIS — K819 Cholecystitis, unspecified: Secondary | ICD-10-CM

## 2013-07-16 LAB — GLUCOSE, CAPILLARY
Glucose-Capillary: 104 mg/dL — ABNORMAL HIGH (ref 70–99)
Glucose-Capillary: 71 mg/dL (ref 70–99)
Glucose-Capillary: 188 mg/dL — ABNORMAL HIGH (ref 70–99)

## 2013-07-16 LAB — BASIC METABOLIC PANEL
BUN: 14 mg/dL (ref 6–23)
CO2: 27 mEq/L (ref 19–32)
Calcium: 8.5 mg/dL (ref 8.4–10.5)
Chloride: 101 mEq/L (ref 96–112)
GFR calc non Af Amer: 54 mL/min — ABNORMAL LOW (ref >90)
Glucose, Bld: 108 mg/dL — ABNORMAL HIGH (ref 70–99)

## 2013-07-16 LAB — CBC
HCT: 38.1 % (ref 36.0–46.0)
MCH: 29.3 pg (ref 26.0–34.0)
MCHC: 34.9 g/dL (ref 30.0–36.0)
Platelets: 195 10*3/uL (ref 150–400)

## 2013-07-16 MED ORDER — MORPHINE SULFATE 2 MG/ML IJ SOLN
2.0000 mg | INTRAMUSCULAR | Status: AC
  Administered 2013-07-16: 2 mg via INTRAVENOUS
  Filled 2013-07-16: qty 1

## 2013-07-16 MED ORDER — FLEET ENEMA 7-19 GM/118ML RE ENEM
1.0000 | ENEMA | Freq: Once | RECTAL | Status: AC
  Administered 2013-07-17: 1 via RECTAL
  Filled 2013-07-16 (×2): qty 1

## 2013-07-16 MED ORDER — GABAPENTIN 400 MG PO CAPS
800.0000 mg | ORAL_CAPSULE | Freq: Two times a day (BID) | ORAL | Status: DC
Start: 2013-07-16 — End: 2013-07-25
  Administered 2013-07-16 – 2013-07-25 (×17): 800 mg via ORAL
  Filled 2013-07-16 (×25): qty 2

## 2013-07-16 MED ORDER — FLEET ENEMA 7-19 GM/118ML RE ENEM
1.0000 | ENEMA | Freq: Once | RECTAL | Status: AC
  Administered 2013-07-16: 1 via RECTAL
  Filled 2013-07-16: qty 1

## 2013-07-16 MED ORDER — CLONAZEPAM 0.5 MG PO TABS
0.5000 mg | ORAL_TABLET | Freq: Three times a day (TID) | ORAL | Status: DC | PRN
  Administered 2013-07-20 – 2013-07-25 (×10): 0.5 mg via ORAL
  Filled 2013-07-16 (×11): qty 1

## 2013-07-16 MED ORDER — MORPHINE SULFATE 4 MG/ML IJ SOLN
4.0000 mg | INTRAMUSCULAR | Status: DC | PRN
  Administered 2013-07-16 (×2): 4 mg via INTRAVENOUS
  Filled 2013-07-16 (×2): qty 1

## 2013-07-16 MED ORDER — ATORVASTATIN CALCIUM 40 MG PO TABS
40.0000 mg | ORAL_TABLET | Freq: Every day | ORAL | Status: DC
  Administered 2013-07-16 – 2013-07-25 (×9): 40 mg via ORAL
  Filled 2013-07-16 (×12): qty 1

## 2013-07-16 MED ORDER — ZIPRASIDONE HCL 80 MG PO CAPS
80.0000 mg | ORAL_CAPSULE | Freq: Every day | ORAL | Status: DC
  Administered 2013-07-16 – 2013-07-17 (×2): 80 mg via ORAL
  Filled 2013-07-16 (×3): qty 1

## 2013-07-16 MED ORDER — INSULIN GLARGINE 100 UNIT/ML ~~LOC~~ SOLN
15.0000 [IU] | Freq: Every day | SUBCUTANEOUS | Status: DC
  Administered 2013-07-16 – 2013-07-17 (×2): 15 [IU] via SUBCUTANEOUS
  Filled 2013-07-16 (×3): qty 0.15

## 2013-07-16 MED ORDER — PEG 3350-KCL-NA BICARB-NACL 420 G PO SOLR
1000.0000 mL | Freq: Once | ORAL | Status: AC
  Administered 2013-07-16: 1000 mL via ORAL
  Filled 2013-07-16: qty 4000

## 2013-07-16 MED ORDER — ONDANSETRON HCL 4 MG/2ML IJ SOLN
4.0000 mg | Freq: Four times a day (QID) | INTRAMUSCULAR | Status: DC | PRN
  Administered 2013-07-21: 4 mg via INTRAVENOUS
  Filled 2013-07-16 (×3): qty 2

## 2013-07-16 MED ORDER — ENOXAPARIN SODIUM 40 MG/0.4ML ~~LOC~~ SOLN
40.0000 mg | Freq: Every day | SUBCUTANEOUS | Status: DC
  Administered 2013-07-16 – 2013-07-18 (×3): 40 mg via SUBCUTANEOUS
  Filled 2013-07-16 (×6): qty 0.4

## 2013-07-16 MED ORDER — NICOTINE 14 MG/24HR TD PT24
14.0000 mg | MEDICATED_PATCH | Freq: Every day | TRANSDERMAL | Status: DC
  Administered 2013-07-16 – 2013-07-18 (×4): 14 mg via TRANSDERMAL
  Filled 2013-07-16 (×7): qty 1

## 2013-07-16 MED ORDER — HYDROXYZINE HCL 25 MG PO TABS
100.0000 mg | ORAL_TABLET | Freq: Three times a day (TID) | ORAL | Status: DC | PRN
  Administered 2013-07-21 – 2013-07-22 (×2): 100 mg via ORAL
  Filled 2013-07-16 (×2): qty 4

## 2013-07-16 MED ORDER — INSULIN ASPART 100 UNIT/ML ~~LOC~~ SOLN
0.0000 [IU] | SUBCUTANEOUS | Status: DC
  Administered 2013-07-16: 2 [IU] via SUBCUTANEOUS
  Administered 2013-07-16: 1 [IU] via SUBCUTANEOUS
  Administered 2013-07-17 – 2013-07-18 (×2): 2 [IU] via SUBCUTANEOUS
  Administered 2013-07-18 (×3): 3 [IU] via SUBCUTANEOUS
  Administered 2013-07-18: 9 [IU] via SUBCUTANEOUS
  Administered 2013-07-19: 2 [IU] via SUBCUTANEOUS
  Administered 2013-07-19: 3 [IU] via SUBCUTANEOUS
  Administered 2013-07-19: 1 [IU] via SUBCUTANEOUS
  Administered 2013-07-19: 3 [IU] via SUBCUTANEOUS
  Administered 2013-07-20 (×2): 2 [IU] via SUBCUTANEOUS
  Administered 2013-07-20: 3 [IU] via SUBCUTANEOUS
  Administered 2013-07-20: 1 [IU] via SUBCUTANEOUS
  Administered 2013-07-20: 5 [IU] via SUBCUTANEOUS
  Administered 2013-07-21 (×2): 7 [IU] via SUBCUTANEOUS

## 2013-07-16 MED ORDER — LAMOTRIGINE 100 MG PO TABS
100.0000 mg | ORAL_TABLET | Freq: Two times a day (BID) | ORAL | Status: DC
  Administered 2013-07-16 – 2013-07-20 (×8): 100 mg via ORAL
  Filled 2013-07-16 (×11): qty 1

## 2013-07-16 MED ORDER — RAMELTEON 8 MG PO TABS
8.0000 mg | ORAL_TABLET | Freq: Every day | ORAL | Status: DC
  Administered 2013-07-16 – 2013-07-24 (×10): 8 mg via ORAL
  Filled 2013-07-16 (×12): qty 1

## 2013-07-16 MED ORDER — CLONAZEPAM 1 MG PO TABS
1.0000 mg | ORAL_TABLET | Freq: Three times a day (TID) | ORAL | Status: DC | PRN
  Administered 2013-07-16 (×2): 1 mg via ORAL
  Filled 2013-07-16: qty 1

## 2013-07-16 MED ORDER — SODIUM CHLORIDE 0.9 % IV SOLN
INTRAVENOUS | Status: DC
  Administered 2013-07-16 (×3): via INTRAVENOUS

## 2013-07-16 MED ORDER — CLONAZEPAM 0.5 MG PO TABS
0.5000 mg | ORAL_TABLET | Freq: Three times a day (TID) | ORAL | Status: DC
  Administered 2013-07-16 – 2013-07-21 (×15): 0.5 mg via ORAL
  Filled 2013-07-16 (×14): qty 1

## 2013-07-16 MED ORDER — PANTOPRAZOLE SODIUM 40 MG PO TBEC
40.0000 mg | DELAYED_RELEASE_TABLET | Freq: Every day | ORAL | Status: DC
  Administered 2013-07-16 – 2013-07-25 (×10): 40 mg via ORAL
  Filled 2013-07-16 (×10): qty 1

## 2013-07-16 MED ORDER — ZIPRASIDONE HCL 80 MG PO CAPS
160.0000 mg | ORAL_CAPSULE | Freq: Every day | ORAL | Status: DC
  Administered 2013-07-16 – 2013-07-24 (×8): 160 mg via ORAL
  Filled 2013-07-16 (×11): qty 2

## 2013-07-16 MED ORDER — MORPHINE SULFATE 4 MG/ML IJ SOLN
6.0000 mg | INTRAMUSCULAR | Status: DC | PRN
  Administered 2013-07-16: 6 mg via INTRAVENOUS
  Administered 2013-07-16 (×2): 4 mg via INTRAVENOUS
  Administered 2013-07-17 – 2013-07-19 (×17): 6 mg via INTRAVENOUS
  Filled 2013-07-16 (×20): qty 2

## 2013-07-16 MED ORDER — INSULIN ASPART 100 UNIT/ML ~~LOC~~ SOLN: 0.0000 [IU] | SUBCUTANEOUS | Status: DC

## 2013-07-16 MED ORDER — ZIPRASIDONE HCL 80 MG PO CAPS
80.0000 mg | ORAL_CAPSULE | ORAL | Status: AC
  Administered 2013-07-16: 80 mg via ORAL
  Filled 2013-07-16: qty 1

## 2013-07-16 MED ORDER — ONDANSETRON HCL 4 MG PO TABS: 4.0000 mg | ORAL_TABLET | Freq: Four times a day (QID) | ORAL | Status: DC | PRN

## 2013-07-16 NOTE — Consult Note (Signed)
Unassigned patient Reason for Consult: Abnormal CT scan-?colonic mass in the right colon. Referring Physician: FPTS.  Monica [name] is an 52 y.o. female.  HPI: 52 year old white female, with multiple medical problems listed below, was in her USOH till about 6 months ago, when she started having worsening constipation and started noticing gradual abdominal distention. She denies having any melena or hematochezia. Reportedly her weight has been stable. She claims she has 1 BM per week for the last 6 months. She denies having any dysphagia or odynophagia, nausea or vomiting. She had a CT scan of the abdomen and pelvis done yesterday that revealed wall thickening of the cecum and proximal right colon with ?partial obstruction and a large amount of stool in the colon.  An abdominal ultrasound, done on 07/15/13 shows possible acalculous cholecystitis.      Past Medical History  Diagnosis Date  . Diabetes mellitus   . Bipolar disorder   . Depression   . Hepatitis C   . Psychotic episode   . Corneal abrasion, right eye 08/20/2010    Qualifier: Diagnosis of  By: Daphine Deutscher FNP, Zena Amos         Multiple substance abuse Past Surgical History  Procedure Laterality Date  . Cesarean section     History reviewed. No pertinent family history.  Social History:  reports that she has been smoking Cigarettes.  She has been smoking about 1.00 pack per day for over 20 years. She has never used smokeless tobacco. She reports that she does not drink alcohol or use illicit drugs.  Allergies: No Known Allergies  Medications: I have reviewed the patient's current medications.  Results for orders placed during the hospital encounter of 07/15/13 (from the past 48 hour(s))  CBC WITH DIFFERENTIAL     Status: Abnormal   Collection Time    07/15/13  3:18 PM      Result Value Range   WBC 9.9  4.0 - 10.5 K/uL   RBC 5.43 (*) 3.87 - 5.11 MIL/uL   Hemoglobin 16.0 (*) 12.0 - 15.0 g/dL   HCT 16.1  09.6 - 04.5 %   MCV  84.2  78.0 - 100.0 fL   MCH 29.5  26.0 - 34.0 pg   MCHC 35.0  30.0 - 36.0 g/dL   RDW 40.9  81.1 - 91.4 %   Platelets 245  150 - 400 K/uL   Neutrophils Relative % 79 (*) 43 - 77 %   Neutro Abs 7.8 (*) 1.7 - 7.7 K/uL   Lymphocytes Relative 14  12 - 46 %   Lymphs Abs 1.4  0.7 - 4.0 K/uL   Monocytes Relative 6  3 - 12 %   Monocytes Absolute 0.5  0.1 - 1.0 K/uL   Eosinophils Relative 1  0 - 5 %   Eosinophils Absolute 0.1  0.0 - 0.7 K/uL   Basophils Relative 0  0 - 1 %   Basophils Absolute 0.0  0.0 - 0.1 K/uL  COMPREHENSIVE METABOLIC PANEL     Status: Abnormal   Collection Time    07/15/13  3:18 PM      Result Value Range   Sodium 143  135 - 145 mEq/L   Potassium 3.8  3.5 - 5.1 mEq/L   Chloride 103  96 - 112 mEq/L   CO2 27  19 - 32 mEq/L   Glucose, Bld 186 (*) 70 - 99 mg/dL   BUN 14  6 - 23 mg/dL   Creatinine, Ser 7.82 (*)  0.50 - 1.10 mg/dL   Calcium 9.4  8.4 - 62.9 mg/dL   Total Protein 7.9  6.0 - 8.3 g/dL   Albumin 3.6  3.5 - 5.2 g/dL   AST 18  0 - 37 U/L   ALT 15  0 - 35 U/L   Alkaline Phosphatase 131 (*) 39 - 117 U/L   Total Bilirubin 0.4  0.3 - 1.2 mg/dL   GFR calc non Af Amer 51 (*) >90 mL/min   GFR calc Af Amer 59 (*) >90 mL/min   Comment: (NOTE)     The eGFR has been calculated using the CKD EPI equation.     This calculation has not been validated in all clinical situations.     eGFR's persistently <90 mL/min signify possible Chronic Kidney     Disease.  URINALYSIS, ROUTINE W REFLEX MICROSCOPIC     Status: None   Collection Time    07/15/13  6:34 PM      Result Value Range   Color, Urine YELLOW  YELLOW   APPearance CLEAR  CLEAR   Specific Gravity, Urine 1.019  1.005 - 1.030   pH 7.0  5.0 - 8.0   Glucose, UA NEGATIVE  NEGATIVE mg/dL   Hgb urine dipstick NEGATIVE  NEGATIVE   Bilirubin Urine NEGATIVE  NEGATIVE   Ketones, ur NEGATIVE  NEGATIVE mg/dL   Protein, ur NEGATIVE  NEGATIVE mg/dL   Urobilinogen, UA 0.2  0.0 - 1.0 mg/dL   Nitrite NEGATIVE  NEGATIVE    Leukocytes, UA NEGATIVE  NEGATIVE   Comment: MICROSCOPIC NOT DONE ON URINES WITH NEGATIVE PROTEIN, BLOOD, LEUKOCYTES, NITRITE, OR GLUCOSE <1000 mg/dL.  GLUCOSE, CAPILLARY     Status: Abnormal   Collection Time    07/16/13  4:07 AM      Result Value Range   Glucose-Capillary 111 (*) 70 - 99 mg/dL   Comment 1 Documented in Chart     Comment 2 Notify RN    BASIC METABOLIC PANEL     Status: Abnormal   Collection Time    07/16/13  6:25 AM      Result Value Range   Sodium 139  135 - 145 mEq/L   Potassium 3.5  3.5 - 5.1 mEq/L   Chloride 101  96 - 112 mEq/L   CO2 27  19 - 32 mEq/L   Glucose, Bld 108 (*) 70 - 99 mg/dL   BUN 14  6 - 23 mg/dL   Creatinine, Ser 5.28 (*) 0.50 - 1.10 mg/dL   Calcium 8.5  8.4 - 41.3 mg/dL   GFR calc non Af Amer 54 (*) >90 mL/min   GFR calc Af Amer 63 (*) >90 mL/min   Comment: (NOTE)     The eGFR has been calculated using the CKD EPI equation.     This calculation has not been validated in all clinical situations.     eGFR's persistently <90 mL/min signify possible Chronic Kidney     Disease.  CBC     Status: None   Collection Time    07/16/13  6:25 AM      Result Value Range   WBC 10.2  4.0 - 10.5 K/uL   RBC 4.54  3.87 - 5.11 MIL/uL   Hemoglobin 13.3  12.0 - 15.0 g/dL   Comment: REPEATED TO VERIFY     DELTA CHECK NOTED   HCT 38.1  36.0 - 46.0 %   MCV 83.9  78.0 - 100.0 fL   MCH 29.3  26.0 - 34.0 pg   MCHC 34.9  30.0 - 36.0 g/dL   RDW 40.9  81.1 - 91.4 %   Platelets 195  150 - 400 K/uL  GLUCOSE, CAPILLARY     Status: Abnormal   Collection Time    07/16/13  7:53 AM      Result Value Range   Glucose-Capillary 104 (*) 70 - 99 mg/dL   Comment 1 Notify RN    GLUCOSE, CAPILLARY     Status: Abnormal   Collection Time    07/16/13  4:23 PM      Result Value Range   Glucose-Capillary 50 (*) 70 - 99 mg/dL   Comment 1 Notify RN    GLUCOSE, CAPILLARY     Status: None   Collection Time    07/16/13  4:53 PM      Result Value Range   Glucose-Capillary 71   70 - 99 mg/dL   Comment 1 Notify RN     Ct Abdomen Pelvis Wo Contrast  07/15/2013   CLINICAL DATA:  Lower abdominal pain  EXAM: CT ABDOMEN AND PELVIS WITHOUT CONTRAST  TECHNIQUE: Multidetector CT imaging of the abdomen and pelvis was performed following the standard protocol without intravenous contrast.  COMPARISON:  None.  FINDINGS: The lung bases are free of acute infiltrate or sizable effusion.  The liver, spleen, adrenal glands and pancreas are all normal in their CT appearance. The gallbladder is distended and there is prominence of the common bile duct to 1 cm. No definitive obstructive calculus is seen. Correlation with laboratory values is recommended. The kidneys are well visualized without renal calculi or urinary tract obstructive changes.  In the right lower quadrant there is circumferential colonic wall thickening suspicious for a colonic carcinoma. Proximal to this, the cecum is dilated with significant amount of fecal material as well as dilatation with fecal material of the distal terminal ileum. Fecal material is noted within the more distal colon  Bladder is well distended. No pelvic mass lesion is seen. A minimal amount of ascites is noted. No definitive peritoneal implantation is noted. The osseous structures show no acute abnormality.  IMPRESSION: Changes in the right lower quadrant consistent with a colon carcinoma with circumferential wall thickening and a degree of obstructive change with dilatation of the cecum and distal terminal ileum with significant to the fecal material. Further evaluation by means of colonoscopy is recommended. A mild amount of ascites is noted.  Dilatation of the gallbladder with dilation of the common bile duct 1 cm as described. This is of uncertain etiology. Clinical correlation with laboratory values is recommended.   Electronically Signed   By: Alcide Clever M.D.   On: 07/15/2013 19:53   US Abdomen Complete  07/15/2013   CLINICAL DATA:  Lower abdominal  pain.  Distended gallbladder.  EXAM: ULTRASOUND ABDOMEN COMPLETE  COMPARISON:  CT abdomen and pelvis earlier today.  FINDINGS: Gallbladder:  Sludge is present within the gallbladder without visible stones. Gallbladder wall thickness 3 mm. Positive sonographic Murphy's sign.  Common bile duct:  Diameter: Increased at 7-10 mm.  Liver:  No focal lesion identified.  Hepatic steatosis.  IVC:  No abnormality visualized.  Pancreas:  Visualized portion unremarkable.  Spleen:  Size and appearance within normal limits.  Right Kidney:  Length: 9.5 cm. Mild right perinephric fluid of uncertain significance. No stones or obstruction. Echogenicity within normal limits. No mass or hydronephrosis visualized.  Left Kidney:  Length: 10.3 cm. Limited visualization due to gas. No definite  obstruction. Echogenicity within normal limits. No mass or hydronephrosis visualized.  Abdominal aorta:  No aneurysm visualized.  Other findings:  Marked colonic air and stool.  IMPRESSION: Gallbladder sludge with positive sonographic Murphy's sign. No calculi are observed. Acalculous cholecystitis not excluded. Extrahepatic biliary ductal dilatation is also present.   Electronically Signed   By: Davonna Belling M.D.   On: 07/15/2013 21:40   Review of Systems  Constitutional: Negative.   HENT: Negative.   Eyes: Negative.   Respiratory: Negative.   Cardiovascular: Negative.   Gastrointestinal: Positive for nausea, vomiting, abdominal pain and constipation. Negative for blood in stool and melena.  Genitourinary: Negative.   Musculoskeletal: Positive for joint pain.  Skin: Negative.   Neurological: Negative.   Endo/Heme/Allergies: Negative.   Psychiatric/Behavioral: Positive for depression and substance abuse. The patient is nervous/anxious.    Blood pressure 120/74, pulse 72, temperature 98.7 F (37.1 C), temperature source Oral, resp. rate 16, height 5\' 5"  (1.651 m), weight 73.936 kg (163 lb), SpO2 91.00%. Physical Exam  Constitutional:  She is oriented to person, place, and time. She appears well-developed and well-nourished.  HENT:  Head: Normocephalic and atraumatic.  Eyes: EOM are normal.  Neck: Normal range of motion. Neck supple.  Cardiovascular: Normal rate and regular rhythm.   Respiratory: Effort normal and breath sounds normal.  GI: She exhibits distension. She exhibits no mass. There is tenderness. There is guarding. There is no rebound.  Significantly distended abdomen with minimal bowel sounds, very tender to touch with gaurding but no rebound  Musculoskeletal: Normal range of motion.  Neurological: She is alert and oriented to person, place, and time.  Skin: Skin is warm and dry.  Psychiatric: She has a normal mood and affect. Judgment normal.   Assessment/Plan: 1) Right colon mass ?colon cancer and ?acalculous cholecystitis: I have reviewed her chart. I agree with Dr. Jamey Ripa that we may not be able to get her prepped for a colonoscopy considering her heavy stool burden. She is drinking the Golytely now and we will try some Fleets enemas but she may not be able to prep for a colonoscopy.  2) Hepatitis C.  3) HTN/Hyperlipidemia.  4) Tobacco abuse/Opiod dependency/Benzodiazepine abuse.  5) AODM.  6) Bipolar disorder. 7) ?Mild renal insufficiency. Monica [name] 07/16/2013, 6:41 PM

## 2013-07-16 NOTE — Progress Notes (Signed)
Administered first fleet enema.  Pt tolerated well.  Awaiting to see if any results from administration.

## 2013-07-16 NOTE — Progress Notes (Addendum)
Error, wrong pt

## 2013-07-16 NOTE — Progress Notes (Signed)
Family Medicine Teaching Service Daily Progress Note Intern Pager: [phone]  Patient name: Monica [name] Medical record number: [id number] Date of birth: [birth year] Age: 52 y.o. Gender: female  Primary Care Provider: Mat Carne, MD Consultants: general surgery, GI Code Status: full  Pt Overview and Major Events to Date: 12/12 - admitted with bowel obstruction due to colon mass and   Assessment and Plan: Monica [name] is a 52 y.o. female presenting with severe right sided abdominal pain with large bowel obstruction from a mass at the ileocecal junction as well as acalculous cholecystitis. PMH is significant for Type 1 DM, bipolar disorder, hypertension, hepatitis C, history IV drug use, and recent knee surgery.   # Colon mass with bowel obstruction - presumed carcinoma based upon CT and pt without history colon cancer screening; Pt not passing flatus or stool  - Keep NPO  - Increase morphine 4 mg IV q 2 PRN pain (even thought pt had only 10 PO morphine equivalents overnight) - Consulted Dr. Loreta Ave with GI who will see pt today - General surgery, Dr. Corliss Skains, already following and planning for likely colectomy in a few days   # Acalculous cholecystitis - sludge and gall bladder wall thickening seen on u/s  - General surgery, Dr. 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, no evidence of infection or post-operative complication  - Keep dressing in place with steri-strips   # Bipolar Disorder/Anxiety  - Continue home geodon, lamictal, clonazepam, atarax, and ramelteon   # Nicotine Abuse - smokes 1 ppd  - Nicotine patch 14 gm   # HLD  - hold home atorvastatin   FEN/GI: NS @ 100 ml/hr; NPO except meds and ice chips   Prophylaxis: lovenox subcutaneous    Disposition: inpatient  Subjective: pain worsening, thinks it did improved with morphine, has only asked for it once, no flatus or bowel movement overnight, persistent nausea no vomiting   Objective: Temp:  [97.5 F (36.4 C)-99.5 F (37.5 C)] 99 F (37.2 C) (12/13 0527) Pulse Rate:  [65-84] 81 (12/13 0527) Resp:  [15-20] 15 (12/13 0527) BP: (119-138)/(69-83) 125/69 mmHg (12/13 0527) SpO2:  [89 %-100 %] 90 % (12/13 0527) Weight:  [163 lb (73.936 kg)] 163 lb (73.936 kg) (12/12 1956) Physical Exam: General: middle aged female, distressed appearing, conversant  HEENT: NCAT, EOMI, PERRLA, OP dry  Cardiovascular: RRR, no murmurs  Respiratory: normal WOB, CTA-B  Abdomen: distended but marked tenderness of RUQ and RLQ, no guarding, hypoactive bowel sounds  Extremities: left knee with dressing that was taken down and demonstrated well healing vertical incision sutured with steri-strips over that did not demonstrate cellulitis or dehiscence  Skin: no rashes  Neuro: alert, oriented, no focal deficits    Laboratory:  Recent Labs Lab 07/15/13 1518 07/16/13 0625  WBC 9.9 10.2  HGB 16.0* 13.3  HCT 45.7 38.1  PLT 245 195    Recent Labs Lab 07/15/13 1518 07/16/13 0625  NA 143 139  K 3.8 3.5  CL 103 101  CO2 27 27  BUN 14 14  CREATININE 1.21* 1.15*  CALCIUM 9.4 8.5  PROT 7.9  --   BILITOT 0.4  --   [facility]  131*  --   ALT 15  --   AST 18  --   GLUCOSE 186* 108*      Imaging/Diagnostic Tests: CT Abdomen 07/15/2013 IMPRESSION: Changes in the right lower quadrant consistent with a colon carcinoma with circumferential wall thickening and a degree of obstructive change with dilatation of the cecum and distal terminal ileum with significant to the fecal material. Further evaluation by means of colonoscopy is recommended. A mild amount of ascites is noted. Dilatation of the gallbladder with dilation of the common bile duct 1 cm as described.  This is of uncertain etiology. Clinical correlation with laboratory values is recommended. Electronically Signed By: Alcide Clever M.D. On: 07/15/2013 19:53   US Abdomen Complete  07/15/2013 IMPRESSION: Gallbladder sludge with positive sonographic Murphy's sign. No calculi are observed. Acalculous cholecystitis not excluded. Extrahepatic biliary ductal dilatation is also present. Electronically Signed By: Davonna Belling M.D. On: 07/15/2013 21:40    Garnetta Buddy, MD 07/16/2013, 8:24 AM PGY-3, [facility] FPTS Intern pager: [phone], text pages welcome

## 2013-07-16 NOTE — H&P (Signed)
I have seen and examined this patient. I have discussed with Dr Williamson.  I agree with their findings and plans as documented in their admission note.    

## 2013-07-16 NOTE — Progress Notes (Signed)
<principal problem not specified>  Assessment: Probably partially obstructing right colon cancer; possible acute cholecystitis Entire colon appears filled with stool  Plan: She will need right colectomy and cholecystectomy. Optimally could be prepped and done, but given the amount of stool in colon and amount of pain she is having, may need to be done without prep. Colonoscopy would be useful to be sure there are no distal colon lesions as well. Might be possible to give some enemas to clean out distal, but awake GI eval.   Subjective: Still a lot of right sided pain, some nausea, no vomiting. Not had a BM since arrival  Objective: Vital signs in last 24 hours: Temp:  [97.5 F (36.4 C)-99.5 F (37.5 C)] 99 F (37.2 C) (12/13 0527) Pulse Rate:  [65-84] 81 (12/13 0527) Resp:  [15-20] 15 (12/13 0527) BP: (119-138)/(69-83) 125/69 mmHg (12/13 0527) SpO2:  [89 %-100 %] 90 % (12/13 0527) Weight:  [163 lb (73.936 kg)] 163 lb (73.936 kg) (12/12 1956) Last BM Date: 07/15/13  Intake/Output from previous day: 12/12 0701 - 12/13 0700 In: 499 [I.V.:499] Out: -   General appearance: alert, cooperative and mild distress GI: Distended, tender on right side, but soft and no rebound. rare BS  Lab Results:  Results for orders placed during the hospital encounter of 07/15/13 (from the past 24 hour(s))  CBC WITH DIFFERENTIAL     Status: Abnormal   Collection Time    07/15/13  3:18 PM      Result Value Range   WBC 9.9  4.0 - 10.5 K/uL   RBC 5.43 (*) 3.87 - 5.11 MIL/uL   Hemoglobin 16.0 (*) 12.0 - 15.0 g/dL   HCT 16.1  09.6 - 04.5 %   MCV 84.2  78.0 - 100.0 fL   MCH 29.5  26.0 - 34.0 pg   MCHC 35.0  30.0 - 36.0 g/dL   RDW 40.9  81.1 - 91.4 %   Platelets 245  150 - 400 K/uL   Neutrophils Relative % 79 (*) 43 - 77 %   Neutro Abs 7.8 (*) 1.7 - 7.7 K/uL   Lymphocytes Relative 14  12 - 46 %   Lymphs Abs 1.4  0.7 - 4.0 K/uL   Monocytes Relative 6  3 - 12 %   Monocytes Absolute 0.5  0.1 - 1.0  K/uL   Eosinophils Relative 1  0 - 5 %   Eosinophils Absolute 0.1  0.0 - 0.7 K/uL   Basophils Relative 0  0 - 1 %   Basophils Absolute 0.0  0.0 - 0.1 K/uL  COMPREHENSIVE METABOLIC PANEL     Status: Abnormal   Collection Time    07/15/13  3:18 PM      Result Value Range   Sodium 143  135 - 145 mEq/L   Potassium 3.8  3.5 - 5.1 mEq/L   Chloride 103  96 - 112 mEq/L   CO2 27  19 - 32 mEq/L   Glucose, Bld 186 (*) 70 - 99 mg/dL   BUN 14  6 - 23 mg/dL   Creatinine, Ser 7.82 (*) 0.50 - 1.10 mg/dL   Calcium 9.4  8.4 - 95.6 mg/dL   Total Protein 7.9  6.0 - 8.3 g/dL   Albumin 3.6  3.5 - 5.2 g/dL   AST 18  0 - 37 U/L   ALT 15  0 - 35 U/L   Alkaline Phosphatase 131 (*) 39 - 117 U/L   Total Bilirubin 0.4  0.3 -  1.2 mg/dL   GFR calc non Af Amer 51 (*) >90 mL/min   GFR calc Af Amer 59 (*) >90 mL/min  URINALYSIS, ROUTINE W REFLEX MICROSCOPIC     Status: None   Collection Time    07/15/13  6:34 PM      Result Value Range   Color, Urine YELLOW  YELLOW   APPearance CLEAR  CLEAR   Specific Gravity, Urine 1.019  1.005 - 1.030   pH 7.0  5.0 - 8.0   Glucose, UA NEGATIVE  NEGATIVE mg/dL   Hgb urine dipstick NEGATIVE  NEGATIVE   Bilirubin Urine NEGATIVE  NEGATIVE   Ketones, ur NEGATIVE  NEGATIVE mg/dL   Protein, ur NEGATIVE  NEGATIVE mg/dL   Urobilinogen, UA 0.2  0.0 - 1.0 mg/dL   Nitrite NEGATIVE  NEGATIVE   Leukocytes, UA NEGATIVE  NEGATIVE  GLUCOSE, CAPILLARY     Status: Abnormal   Collection Time    07/16/13  4:07 AM      Result Value Range   Glucose-Capillary 111 (*) 70 - 99 mg/dL   Comment 1 Documented in Chart     Comment 2 Notify RN    BASIC METABOLIC PANEL     Status: Abnormal   Collection Time    07/16/13  6:25 AM      Result Value Range   Sodium 139  135 - 145 mEq/L   Potassium 3.5  3.5 - 5.1 mEq/L   Chloride 101  96 - 112 mEq/L   CO2 27  19 - 32 mEq/L   Glucose, Bld 108 (*) 70 - 99 mg/dL   BUN 14  6 - 23 mg/dL   Creatinine, Ser 1.47 (*) 0.50 - 1.10 mg/dL   Calcium 8.5  8.4  - 82.9 mg/dL   GFR calc non Af Amer 54 (*) >90 mL/min   GFR calc Af Amer 63 (*) >90 mL/min  CBC     Status: None   Collection Time    07/16/13  6:25 AM      Result Value Range   WBC 10.2  4.0 - 10.5 K/uL   RBC 4.54  3.87 - 5.11 MIL/uL   Hemoglobin 13.3  12.0 - 15.0 g/dL   HCT 56.2  13.0 - 86.5 %   MCV 83.9  78.0 - 100.0 fL   MCH 29.3  26.0 - 34.0 pg   MCHC 34.9  30.0 - 36.0 g/dL   RDW 78.4  69.6 - 29.5 %   Platelets 195  150 - 400 K/uL  GLUCOSE, CAPILLARY     Status: Abnormal   Collection Time    07/16/13  7:53 AM      Result Value Range   Glucose-Capillary 104 (*) 70 - 99 mg/dL   Comment 1 Notify RN       Studies/Results Radiology     MEDS, Scheduled . enoxaparin (LOVENOX) injection  40 mg Subcutaneous Daily  . insulin aspart  0-9 Units Subcutaneous Q4H  . insulin glargine  15 Units Subcutaneous Daily  . lamoTRIgine  100 mg Oral BID  . nicotine  14 mg Transdermal Daily  . pantoprazole  40 mg Oral Daily  . ramelteon  8 mg Oral QHS  . ziprasidone  80 mg Oral QHS       LOS: 1 day    Currie Paris, MD, [facility], [place] [phone]   07/16/2013 10:19 AM

## 2013-07-16 NOTE — Progress Notes (Signed)
I have seen and examined this patient. I have discussed with Dr Williamson.  I agree with their findings and plans as documented in their progress note.    

## 2013-07-17 ENCOUNTER — Encounter (HOSPITAL_COMMUNITY): Payer: Self-pay

## 2013-07-17 DIAGNOSIS — F319 Bipolar disorder, unspecified: Secondary | ICD-10-CM

## 2013-07-17 DIAGNOSIS — E785 Hyperlipidemia, unspecified: Secondary | ICD-10-CM

## 2013-07-17 DIAGNOSIS — E109 Type 1 diabetes mellitus without complications: Secondary | ICD-10-CM

## 2013-07-17 LAB — GLUCOSE, CAPILLARY
Glucose-Capillary: 104 mg/dL — ABNORMAL HIGH (ref 70–99)
Glucose-Capillary: 189 mg/dL — ABNORMAL HIGH (ref 70–99)
Glucose-Capillary: 62 mg/dL — ABNORMAL LOW (ref 70–99)
Glucose-Capillary: 76 mg/dL (ref 70–99)
Glucose-Capillary: 78 mg/dL (ref 70–99)
Glucose-Capillary: 81 mg/dL (ref 70–99)

## 2013-07-17 LAB — CBC
HCT: 31.3 % — ABNORMAL LOW (ref 36.0–46.0)
Hemoglobin: 10.9 g/dL — ABNORMAL LOW (ref 12.0–15.0)
MCH: 29.6 pg (ref 26.0–34.0)
MCV: 85.1 fL (ref 78.0–100.0)
RBC: 3.68 MIL/uL — ABNORMAL LOW (ref 3.87–5.11)
RDW: 14.3 % (ref 11.5–15.5)
WBC: 4.9 10*3/uL (ref 4.0–10.5)

## 2013-07-17 LAB — URINE CULTURE

## 2013-07-17 LAB — HIV ANTIBODY (ROUTINE TESTING W REFLEX): HIV: NONREACTIVE

## 2013-07-17 LAB — BASIC METABOLIC PANEL
BUN: 9 mg/dL (ref 6–23)
Calcium: 6.8 mg/dL — ABNORMAL LOW (ref 8.4–10.5)
Creatinine, Ser: 0.86 mg/dL (ref 0.50–1.10)
GFR calc Af Amer: 89 mL/min — ABNORMAL LOW (ref >90)
GFR calc non Af Amer: 77 mL/min — ABNORMAL LOW (ref >90)
Glucose, Bld: 79 mg/dL (ref 70–99)
Potassium: 3 mEq/L — ABNORMAL LOW (ref 3.5–5.1)

## 2013-07-17 LAB — HEMOGLOBIN A1C: Hgb A1c MFr Bld: 6.9 % — ABNORMAL HIGH (ref <5.7)

## 2013-07-17 MED ORDER — DEXTROSE 50 % IV SOLN: 25.0000 mL | Freq: Once | INTRAVENOUS | Status: AC | PRN

## 2013-07-17 MED ORDER — SODIUM CHLORIDE 0.9 % IV SOLN: INTRAVENOUS | Status: DC

## 2013-07-17 MED ORDER — DEXTROSE 50 % IV SOLN
INTRAVENOUS | Status: AC
  Administered 2013-07-17: 25 mL
  Filled 2013-07-17: qty 50

## 2013-07-17 MED ORDER — DIPHENHYDRAMINE HCL 50 MG/ML IJ SOLN
INTRAMUSCULAR | Status: AC
  Filled 2013-07-17: qty 1

## 2013-07-17 MED ORDER — FLEET ENEMA 7-19 GM/118ML RE ENEM
2.0000 | ENEMA | Freq: Once | RECTAL | Status: AC
  Administered 2013-07-17: 13:00:00 via RECTAL
  Filled 2013-07-17 (×2): qty 2

## 2013-07-17 MED ORDER — SODIUM CHLORIDE 0.9 % IJ SOLN
10.0000 mL | INTRAMUSCULAR | Status: DC | PRN
  Administered 2013-07-18 – 2013-07-21 (×7): 10 mL
  Administered 2013-07-21: 20 mL
  Administered 2013-07-23 – 2013-07-24 (×2): 10 mL

## 2013-07-17 MED ORDER — KCL IN DEXTROSE-NACL 40-5-0.9 MEQ/L-%-% IV SOLN
INTRAVENOUS | Status: DC
  Administered 2013-07-17 – 2013-07-21 (×8): via INTRAVENOUS
  Filled 2013-07-17 (×15): qty 1000

## 2013-07-17 MED ORDER — MIDAZOLAM HCL 5 MG/ML IJ SOLN
INTRAMUSCULAR | Status: AC
  Filled 2013-07-17: qty 2

## 2013-07-17 MED ORDER — DEXTROSE 50 % IV SOLN
25.0000 mL | Freq: Once | INTRAVENOUS | Status: AC | PRN
  Administered 2013-07-17: 25 mL via INTRAVENOUS

## 2013-07-17 MED ORDER — FENTANYL CITRATE 0.05 MG/ML IJ SOLN
INTRAMUSCULAR | Status: AC
  Filled 2013-07-17: qty 2

## 2013-07-17 MED ORDER — PEG 3350-KCL-NA BICARB-NACL 420 G PO SOLR
4000.0000 mL | Freq: Once | ORAL | Status: AC
  Administered 2013-07-17: 4000 mL via ORAL
  Filled 2013-07-17: qty 4000

## 2013-07-17 MED ORDER — INSULIN GLARGINE 100 UNIT/ML ~~LOC~~ SOLN
10.0000 [IU] | Freq: Every day | SUBCUTANEOUS | Status: DC
  Administered 2013-07-18 – 2013-07-20 (×3): 10 [IU] via SUBCUTANEOUS
  Filled 2013-07-17 (×4): qty 0.1

## 2013-07-17 MED ORDER — DEXTROSE 50 % IV SOLN
INTRAVENOUS | Status: AC
  Filled 2013-07-17: qty 50

## 2013-07-17 MED ORDER — SODIUM CHLORIDE 0.9 % IJ SOLN
10.0000 mL | Freq: Two times a day (BID) | INTRAMUSCULAR | Status: DC
  Administered 2013-07-21 – 2013-07-24 (×4): 10 mL

## 2013-07-17 NOTE — Progress Notes (Signed)
Unassigned patient Subjective: Since I last evaluated the patient, she has taken the prep but the stools are still brown but the stools are all liquid. She continues to complain of severe abdominal pain.    Objective: Vital signs in last 24 hours: Temp:  [98.4 F (36.9 C)-99.1 F (37.3 C)] 98.4 F (36.9 C) (12/14 0527) Pulse Rate:  [69-70] 70 (12/14 0527) Resp:  [16-19] 19 (12/14 1520) BP: (106-133)/(70-73) 133/73 mmHg (12/14 1520) SpO2:  [93 %-96 %] 96 % (12/14 1520) Last BM Date: 07/17/13  Intake/Output from previous day: 12/13 0701 - 12/14 0700 In: 780 [P.O.:780] Out: 650 [Urine:650] Intake/Output this shift:   General appearance: alert, cooperative, appears stated age, fatigued and no distress Resp: clear to auscultation bilaterally Cardio: regular rate and rhythm, S1, S2 normal, no murmur, click, rub or gallop GI: soft, distended with hypoactive bowel sounds; no masses,  no organomegaly Extremities: extremities normal, atraumatic, no cyanosis or edema  Lab Results:  Recent Labs  07/15/13 1518 07/16/13 0625 07/17/13 0930  WBC 9.9 10.2 4.9  HGB 16.0* 13.3 10.9*  HCT 45.7 38.1 31.3*  PLT 245 195 148*   BMET  Recent Labs  07/15/13 1518 07/16/13 0625 07/17/13 0930  NA 143 139 142  K 3.8 3.5 3.0*  CL 103 101 112  CO2 27 27 23   GLUCOSE 186* 108* 79  BUN 14 14 9   CREATININE 1.21* 1.15* 0.86  CALCIUM 9.4 8.5 6.8*   LFT  Recent Labs  07/15/13 1518  PROT 7.9  ALBUMIN 3.6  AST 18  ALT 15  ALKPHOS 131*  BILITOT 0.4   Studies/Results: Ct Abdomen Pelvis Wo Contrast  07/15/2013   CLINICAL DATA:  Lower abdominal pain  EXAM: CT ABDOMEN AND PELVIS WITHOUT CONTRAST  TECHNIQUE: Multidetector CT imaging of the abdomen and pelvis was performed following the standard protocol without intravenous contrast.  COMPARISON:  None.  FINDINGS: The lung bases are free of acute infiltrate or sizable effusion.  The liver, spleen, adrenal glands and pancreas are all normal in  their CT appearance. The gallbladder is distended and there is prominence of the common bile duct to 1 cm. No definitive obstructive calculus is seen. Correlation with laboratory values is recommended. The kidneys are well visualized without renal calculi or urinary tract obstructive changes.  In the right lower quadrant there is circumferential colonic wall thickening suspicious for a colonic carcinoma. Proximal to this, the cecum is dilated with significant amount of fecal material as well as dilatation with fecal material of the distal terminal ileum. Fecal material is noted within the more distal colon  Bladder is well distended. No pelvic mass lesion is seen. A minimal amount of ascites is noted. No definitive peritoneal implantation is noted. The osseous structures show no acute abnormality.  IMPRESSION: Changes in the right lower quadrant consistent with a colon carcinoma with circumferential wall thickening and a degree of obstructive change with dilatation of the cecum and distal terminal ileum with significant to the fecal material. Further evaluation by means of colonoscopy is recommended. A mild amount of ascites is noted.  Dilatation of the gallbladder with dilation of the common bile duct 1 cm as described. This is of uncertain etiology. Clinical correlation with laboratory values is recommended.   Electronically Signed   By: Alcide Clever M.D.   On: 07/15/2013 19:53   US Abdomen Complete  07/15/2013   CLINICAL DATA:  Lower abdominal pain.  Distended gallbladder.  EXAM: ULTRASOUND ABDOMEN COMPLETE  COMPARISON:  CT  abdomen and pelvis earlier today.  FINDINGS: Gallbladder:  Sludge is present within the gallbladder without visible stones. Gallbladder wall thickness 3 mm. Positive sonographic Murphy's sign.  Common bile duct:  Diameter: Increased at 7-10 mm.  Liver:  No focal lesion identified.  Hepatic steatosis.  IVC:  No abnormality visualized.  Pancreas:  Visualized portion unremarkable.  Spleen:   Size and appearance within normal limits.  Right Kidney:  Length: 9.5 cm. Mild right perinephric fluid of uncertain significance. No stones or obstruction. Echogenicity within normal limits. No mass or hydronephrosis visualized.  Left Kidney:  Length: 10.3 cm. Limited visualization due to gas. No definite obstruction. Echogenicity within normal limits. No mass or hydronephrosis visualized.  Abdominal aorta:  No aneurysm visualized.  Other findings:  Marked colonic air and stool.  IMPRESSION: Gallbladder sludge with positive sonographic Murphy's sign. No calculi are observed. Acalculous cholecystitis not excluded. Extrahepatic biliary ductal dilatation is also present.   Electronically Signed   By: Davonna Belling M.D.   On: 07/15/2013 21:40   Medications: I have reviewed the patient's current medications.  Assessment/Plan: 1) Severe constipation with a right sided colonic mass-we tried to attempt a colonoscopy but there was no IV access available. Will continue prepping tonight and follow up on her tomorrow. 2) Hepatitis C. .  LOS: 2 days   Mikea Quadros 07/17/2013, 4:17 PM

## 2013-07-17 NOTE — Progress Notes (Signed)
I discussed with Dr Hunter.  I agree with their plans documented in their progress note for today.  

## 2013-07-17 NOTE — Progress Notes (Signed)
Hypoglycemic Event  CBG: 62  Treatment: D50 IV 25 mL  Symptoms: None  Follow-up CBG: Time:0500 CBG Result:95  Possible Reasons for Event: Inadequate meal intake  Comments/MD notified:none    Monica [name] E  Remember to initiate Hypoglycemia Order Set & complete

## 2013-07-17 NOTE — Progress Notes (Signed)
Peripherally Inserted Central Catheter/Midline Placement  The IV Nurse has discussed with the patient and/or persons authorized to consent for the patient, the purpose of this procedure and the potential benefits and risks involved with this procedure.  The benefits include less needle sticks, lab draws from the catheter and patient may be discharged home with the catheter.  Risks include, but not limited to, infection, bleeding, blood clot (thrombus formation), and puncture of an artery; nerve damage and irregular heat beat.  Alternatives to this procedure were also discussed.  PICC/Midline Placement Documentation  PICC / Midline Double Lumen 07/17/13 PICC Right Brachial 35 cm 0 cm (Active)  Indication for Insertion or Continuance of Line Poor Vasculature-patient has had multiple peripheral attempts or PIVs lasting less than 24 hours 07/17/2013  6:00 PM  Exposed Catheter (cm) 0 cm 07/17/2013  6:00 PM  Site Assessment Clean;Dry;Intact 07/17/2013  6:00 PM  Lumen #1 Status Flushed;Saline locked;Blood return noted 07/17/2013  6:00 PM  Lumen #2 Status Flushed;Saline locked;Blood return noted 07/17/2013  6:00 PM  Dressing Type Transparent 07/17/2013  6:00 PM  Dressing Status Clean;Dry;Intact 07/17/2013  6:00 PM  Dressing Intervention New dressing 07/17/2013  6:00 PM  Dressing Change Due 07/24/13 07/17/2013  6:00 PM       Ethelda Chick 07/17/2013, 6:03 PM

## 2013-07-17 NOTE — Progress Notes (Signed)
Hypoglycemic Event  CBG: 66  Treatment: D50 IV 25 mL  Symptoms: None  Follow-up CBG: Time:0015 CBG Result:104  Possible Reasons for Event: Inadequate meal intake  Comments/MD notified: none    Lorayne Marek E  Remember to initiate Hypoglycemia Order Set & complete

## 2013-07-17 NOTE — Progress Notes (Signed)
Family Medicine Teaching Service Daily Progress Note Intern Pager: [phone]  Patient name: Monica [name] Medical record number: [id number] Date of birth: Sep 30, 1960 Age: 52 y.o. Gender: female  Primary Care Provider: Mat Carne, MD Consultants: general surgery, GI Code Status: full  Pt Overview and Major Events to Date: 12/12 - admitted with bowel obstruction due to colon mass and likely acalculous cholecystis   Assessment and Plan: Monica [name] is a 52 y.o. female presenting with severe right sided abdominal pain with large bowel obstruction from a mass at the ileocecal junction as well as acalculous cholecystitis. PMH is significant for Type 1 DM, bipolar disorder, hypertension, hepatitis C, history IV drug use, and recent knee surgery.   # Colon mass with bowel obstruction - presumed carcinoma based upon CT and pt without history colon cancer screening; Pt not passing flatus or stool on admission, now with diarrhea - Keep NPO  - morphine 4 mg IV q 3 PRN pain. Moderate control.  - appreciate GI recs by Dr. Barnett Abu clean out, possible colonoscopy - General surgery, Dr. Corliss Skains, already following and planning for likely colectomy early this week -CEA, CBC pending  # Acalculous cholecystitis - sludge and gall bladder wall thickening seen on u/s  - General surgery, Dr. 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 Did have several bouts of diarrhea. Mild nausea, no vomiting  Objective: Temp:  [98.4 F (36.9 C)-99.1 F (37.3 C)] 98.4 F (36.9 C) (12/14 0527) Pulse Rate:  [69-72] 70 (12/14 0527) Resp:  [16] 16 (12/14 0527) BP: (106-122)/(70-74) 106/71 mmHg (12/14 0527) SpO2:  [91 %-93 %] 93 % (12/14 0527) Physical Exam: General: middle aged female, distressed appearing, conversant  Cardiovascular: RRR, no murmurs  Respiratory: normal WOB, CTA-B  Abdomen: distended, tender to even light palpation of all quadrants worse in RUQ but also in LLQ and RLQ. Hyperactive bowel sounds.   Extremities: left knee with dressing(not taken down), no drainage noted  Skin: no rashes  Neuro: alert, oriented, no focal deficits    Laboratory:  Recent Labs Lab 07/15/13 1518 07/16/13 0625  WBC 9.9 10.2  HGB 16.0* 13.3  HCT 45.7 38.1  PLT 245 195    Recent Labs Lab 07/15/13 1518 07/16/13 0625  NA 143 139  K 3.8 3.5  CL 103 101  CO2 27 27  BUN 14 14  CREATININE 1.21* 1.15*  CALCIUM 9.4 8.5  PROT 7.9  --   BILITOT 0.4  --   ALKPHOS 131*  --   ALT 15  --   AST 18  --   GLUCOSE 186* 108*      Imaging/Diagnostic Tests: CT Abdomen 07/15/2013 IMPRESSION: Changes in the right lower quadrant consistent with a colon carcinoma with circumferential wall thickening and a degree of obstructive change with dilatation of the cecum and distal terminal ileum with significant to the fecal material. Further evaluation by means of colonoscopy is recommended. A  mild amount of ascites is noted. Dilatation of the gallbladder with dilation of the common bile duct 1 cm as described. This is of uncertain etiology. Clinical correlation with laboratory values is recommended. Electronically Signed By: Alcide Clever M.D. On: 07/15/2013 19:53   US Abdomen Complete  07/15/2013 IMPRESSION: Gallbladder sludge with positive sonographic Murphy's sign. No calculi are observed. Acalculous cholecystitis not excluded. Extrahepatic biliary ductal dilatation is also present. Electronically Signed By: Davonna Belling M.D. On: 07/15/2013 21:40    Shelva Majestic, MD 07/17/2013, 9:50 AM PGY-3, [facility] FPTS Intern pager: [phone], text pages welcome

## 2013-07-17 NOTE — Progress Notes (Signed)
Colonic mass  Assessment: Clinically improved from yesterday, less pain, having some stolls as a result of enemas and golytely  Plan: Continue gentle bowel prep Hopefully colonoscopy Then will need right colectomy and cholecystectomy, base on w/u so far Discussed with patient. She understands she may need ostomy and later surgery to reverse it   Subjective: Still with RUQ pain, better controlled an less this am than yesterday  Objective: Vital signs in last 24 hours: Temp:  [98.4 F (36.9 C)-99.1 F (37.3 C)] 98.4 F (36.9 C) (12/14 0527) Pulse Rate:  [69-72] 70 (12/14 0527) Resp:  [16] 16 (12/14 0527) BP: (106-122)/(70-74) 106/71 mmHg (12/14 0527) SpO2:  [91 %-93 %] 93 % (12/14 0527) Last BM Date: 07/15/13  Intake/Output from previous day: 12/13 0701 - 12/14 0700 In: 780 [P.O.:780] Out: 650 [Urine:650]  General appearance: alert, cooperative and mild distress Resp: clear to auscultation bilaterally GI: Less distended than yesterday, still soft, no rebound, less tender in right side than yesterday.  Lab Results:  Results for orders placed during the hospital encounter of 07/15/13 (from the past 24 hour(s))  GLUCOSE, CAPILLARY     Status: Abnormal   Collection Time    07/16/13 12:27 PM      Result Value Range   Glucose-Capillary 134 (*) 70 - 99 mg/dL   Comment 1 Notify RN    GLUCOSE, CAPILLARY     Status: Abnormal   Collection Time    07/16/13  4:23 PM      Result Value Range   Glucose-Capillary 50 (*) 70 - 99 mg/dL   Comment 1 Notify RN    GLUCOSE, CAPILLARY     Status: None   Collection Time    07/16/13  4:53 PM      Result Value Range   Glucose-Capillary 71  70 - 99 mg/dL   Comment 1 Notify RN    GLUCOSE, CAPILLARY     Status: Abnormal   Collection Time    07/16/13  8:00 PM      Result Value Range   Glucose-Capillary 188 (*) 70 - 99 mg/dL   Comment 1 Documented in Chart     Comment 2 Notify RN    GLUCOSE, CAPILLARY     Status: Abnormal   Collection  Time    07/17/13 12:06 AM      Result Value Range   Glucose-Capillary 66 (*) 70 - 99 mg/dL  GLUCOSE, CAPILLARY     Status: Abnormal   Collection Time    07/17/13 12:46 AM      Result Value Range   Glucose-Capillary 104 (*) 70 - 99 mg/dL   Comment 1 Documented in Chart    GLUCOSE, CAPILLARY     Status: Abnormal   Collection Time    07/17/13  4:15 AM      Result Value Range   Glucose-Capillary 62 (*) 70 - 99 mg/dL   Comment 1 Documented in Chart     Comment 2 Notify RN    GLUCOSE, CAPILLARY     Status: None   Collection Time    07/17/13  5:07 AM      Result Value Range   Glucose-Capillary 95  70 - 99 mg/dL  GLUCOSE, CAPILLARY     Status: None   Collection Time    07/17/13  8:02 AM      Result Value Range   Glucose-Capillary 76  70 - 99 mg/dL  CBC     Status: Abnormal   Collection Time  07/17/13  9:30 AM      Result Value Range   WBC 4.9  4.0 - 10.5 K/uL   RBC 3.68 (*) 3.87 - 5.11 MIL/uL   Hemoglobin 10.9 (*) 12.0 - 15.0 g/dL   HCT 40.9 (*) 81.1 - 91.4 %   MCV 85.1  78.0 - 100.0 fL   MCH 29.6  26.0 - 34.0 pg   MCHC 34.8  30.0 - 36.0 g/dL   RDW 78.2  95.6 - 21.3 %   Platelets 148 (*) 150 - 400 K/uL     Studies/Results Radiology     MEDS, Scheduled . atorvastatin  40 mg Oral q1800  . clonazePAM  0.5 mg Oral TID  . enoxaparin (LOVENOX) injection  40 mg Subcutaneous Daily  . gabapentin  800 mg Oral BID  . insulin aspart  0-9 Units Subcutaneous Q4H  . [START ON 07/18/2013] insulin glargine  10 Units Subcutaneous Daily  . lamoTRIgine  100 mg Oral BID  . nicotine  14 mg Transdermal Daily  . pantoprazole  40 mg Oral Daily  . ramelteon  8 mg Oral QHS  . ziprasidone  160 mg Oral QHS       LOS: 2 days    Currie Paris, MD, [facility], [place] [phone]   07/17/2013 10:42 AM

## 2013-07-18 ENCOUNTER — Encounter (HOSPITAL_COMMUNITY)
Admission: EM | Disposition: A | Discharge status: Home / Self Care | Payer: Self-pay | Source: Home / Self Care | Attending: Family Medicine

## 2013-07-18 LAB — GLUCOSE, CAPILLARY
Glucose-Capillary: 188 mg/dL — ABNORMAL HIGH (ref 70–99)
Glucose-Capillary: 207 mg/dL — ABNORMAL HIGH (ref 70–99)
Glucose-Capillary: 241 mg/dL — ABNORMAL HIGH (ref 70–99)
Glucose-Capillary: 244 mg/dL — ABNORMAL HIGH (ref 70–99)
Glucose-Capillary: 324 mg/dL — ABNORMAL HIGH (ref 70–99)
Glucose-Capillary: 405 mg/dL — ABNORMAL HIGH (ref 70–99)

## 2013-07-18 SURGERY — Surgical Case

## 2013-07-18 MED ORDER — POLYETHYLENE GLYCOL 3350 17 GM/SCOOP PO POWD
1.0000 | Freq: Once | ORAL | Status: AC
  Administered 2013-07-18: 1 via ORAL
  Filled 2013-07-18 (×2): qty 255

## 2013-07-18 NOTE — Progress Notes (Signed)
Pain much better. Noted plan for colonoscopy tomorrow. Tentative plan for right colectomy and cholecystectomy Wednesday. I discussed the procedure with her in detail. Patient examined and I agree with the assessment and plan  Violeta Gelinas, MD, MPH, FACS Pager: [phone]  07/18/2013 12:56 PM

## 2013-07-18 NOTE — Progress Notes (Signed)
FMTS Attending Note Patient seen and examined by me, discussed with resident team and I agree with Dr Althea Charon' assessment and plan as per this note.  Patient undergoing bowel prep in anticipation for colonoscopy and hemicolectomy/CCY surgery for ileocecal mass.  Paula Compton, MD

## 2013-07-18 NOTE — Progress Notes (Signed)
Family Medicine Teaching Service Daily Progress Note Intern Pager: [phone]  Patient name: Monica [name] Medical record number: [id number] Date of birth: 04/08/1961 Age: 52 y.o. Gender: female  Primary Care Provider: WILLIAMSON, EDWARD, MD Consultants: general surgery, GI Code Status: full  Pt Overview and Major Events to Date: 12/12 - admitted with bowel obstruction due to colon mass and likely acalculous cholecystis 12/15 - continue bowel prep today, plan colonoscopy (12/16), tentative surgery (12/17)  Assessment and Plan: Verdell S Habermehl is a 52 y.o. female presenting with severe right sided abdominal pain with large bowel obstruction from a mass at the ileocecal junction as well as acalculous cholecystitis. PMH is significant for Type 1 DM, bipolar disorder, hypertension, hepatitis C, history IV drug use, and recent knee surgery.   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 Presumed carcinoma based upon CT and pt without history of prior colon cancer screening. On admission, noted not passing stool or flatus, has improved and currently with loose stools. - Pain Management:     - Morphine 6 mg IV q 3 PRN pain (received 6x doses 24 hrs, total 36mg Morphine IV), currently received 4x today - c/s General Surgery, appreciate all recommendations with management    - plan for tentative surgery for colon resection (removal of R-ileocecal mass) and cholecystectomy    - pending colonoscopy prior to proceeding with surgery (possible Weds 12/17)    - clears only while bowel prep, NPO after midnight    - pain control, anti-emetics, SCDs and lovenox (hold prior to OR)    - CEA 2.2 (normal range) - c/s GI, appreciate all recommendations with management    - continue with bowel prep (today with  brown liquid stools, not fully prepped)    - Miralax x 1 bottle prep today    - scheduled colonoscopy tomorrow (12/16)  # Acalculous cholecystitis, sludge with extrahepatic biliary ductal dilatation Abdominal US with noted sludge and gall bladder wall thickening. - c/s General Surgery    - tentative plan for cholecystectomy in addition to colectomy    - see management above under plan for R-colon mass  # Type 1 DM History of very brittle diabetes. 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, no evidence of infection or post-operative complication  - Keep dressing in place with steri-strips, reportedly able to remove strips  # Bipolar Disorder/Anxiety  - Continue home geodon, lamictal, clonazepam, atarax, and ramelteon   # Nicotine Abuse - smokes 1 ppd  - Nicotine patch 14 gm   # HLD  -  home atorvastatin (hold if desired while NPO)  # Hypokalemia K+ 3.0, replaced with K+ 40meq with IVF - f/u BMET  FEN/GI: NS @ 100 ml/hr; NPO except meds and ice chips  Prophylaxis: lovenox subcutaneous    Disposition: Admitted to FPTS, inpatient status, pending complete work-up and surgical interventions with recovery and improved clinical course, expect patient to be discharged to home after multiple days.  Subjective: No acute events overnight. Today she reports that she still has significant Right sided abdominal pain (difficult to determine   if improved from yesterday), but overall improved since admission. Changing positions bothers it most, but once she stands up she is able to walk okay. Continues bowel prep clearing (brown liquid stools). Denies fevers, chills, nausea, vomiting.  Objective: Temp:  [97.4 F (36.3 C)-98.5 F (36.9 C)] 97.4 F (36.3 C)  (12/15 1342) Pulse Rate:  [59-66] 63 (12/15 1342) Resp:  [16-19] 16 (12/15 1342) BP: (105-134)/(66-86) 113/66 mmHg (12/15 1342) SpO2:  [93 %-99 %] 93 % (12/15 1342) Physical Exam: General: middle aged female, anxious and distressed appearing, conversational but mild-mod distress due to abd pain Cardiovascular: RRR, no murmurs  Respiratory: normal WOB, CTA-B  Abdomen: soft, with moderate distention, tender to light to moderate palpation RUQ / RLQ, with some generalized tenderness over Left abdomen. +active BS  Extremities: left knee (recent surgery, 1 week ago) steri-strips, no drainage noted  Skin: no rashes  Neuro: alert, oriented, no focal deficits  Laboratory:  Recent Labs Lab 07/15/13 1518 07/16/13 0625 07/17/13 0930  WBC 9.9 10.2 4.9  HGB 16.0* 13.3 10.9*  HCT 45.7 38.1 31.3*  PLT 245 195 148*    Recent Labs Lab 07/15/13 1518 07/16/13 0625 07/17/13 0930  NA 143 139 142  K 3.8 3.5 3.0*  CL 103 101 112  CO2 27 27 23  BUN 14 14 9  CREATININE 1.21* 1.15* 0.86  CALCIUM 9.4 8.5 6.8*  PROT 7.9  --   --   BILITOT 0.4  --   --   ALKPHOS 131*  --   --   ALT 15  --   --   AST 18  --   --   GLUCOSE 186* 108* 79    CEA 2.2 (normal)  Imaging/Diagnostic Tests: 07/15/13 CT Abdomen IMPRESSION: Changes in the right lower quadrant consistent with a colon carcinoma with circumferential wall thickening and a degree of obstructive change with dilatation of the cecum and distal terminal ileum with significant to the fecal material. Further evaluation by means of colonoscopy is recommended. A mild amount of ascites is noted. Dilatation of the gallbladder with dilation of the common bile duct 1 cm as described. This is of uncertain etiology. Clinical correlation with laboratory values is recommended.  07/15/13 Us Abdomen Complete  IMPRESSION: Gallbladder sludge with positive sonographic Murphy's sign. No calculi are observed. Acalculous cholecystitis not excluded. Extrahepatic  biliary ductal dilatation is also present.   Cari Burgo, DO 07/18/2013, 2:34 PM PGY-1, [facility] FPTS Intern pager: [phone], text pages welcome  

## 2013-07-18 NOTE — Progress Notes (Signed)
Subjective: Brown liquid stool.  Abdomen feels better.  Objective: Vital signs in last 24 hours: Temp:  [97.7 F (36.5 C)-98.5 F (36.9 C)] 98.5 F (36.9 C) (12/15 0459) Pulse Rate:  [59-66] 59 (12/15 0459) Resp:  [18-19] 18 (12/15 0459) BP: (105-134)/(69-86) 105/74 mmHg (12/15 0459) SpO2:  [96 %-99 %] 99 % (12/15 0459) Last BM Date: 07/17/13  Intake/Output from previous day: 12/14 0701 - 12/15 0700 In: 1523.3 [P.O.:700; I.V.:823.3] Out: 375 [Urine:375] Intake/Output this shift:    General appearance: alert and no distress GI: soft, epigastric tenderness  Lab Results:  Recent Labs  07/15/13 1518 07/16/13 0625 07/17/13 0930  WBC 9.9 10.2 4.9  HGB 16.0* 13.3 10.9*  HCT 45.7 38.1 31.3*  PLT 245 195 148*   BMET  Recent Labs  07/15/13 1518 07/16/13 0625 07/17/13 0930  NA 143 139 142  K 3.8 3.5 3.0*  CL 103 101 112  CO2 27 27 23   GLUCOSE 186* 108* 79  BUN 14 14 9   CREATININE 1.21* 1.15* 0.86  CALCIUM 9.4 8.5 6.8*   LFT  Recent Labs  07/15/13 1518  PROT 7.9  ALBUMIN 3.6  AST 18  ALT 15  ALKPHOS 131*  BILITOT 0.4   PT/INR No results found for this basename: LABPROT, INR,  in the last 72 hours Hepatitis Panel No results found for this basename: HEPBSAG, HCVAB, HEPAIGM, HEPBIGM,  in the last 72 hours C-Diff No results found for this basename: CDIFFTOX,  in the last 72 hours Fecal Lactopherrin No results found for this basename: FECLLACTOFRN,  in the last 72 hours  Studies/Results: No results found.  Medications:  Scheduled: . atorvastatin  40 mg Oral q1800  . clonazePAM  0.5 mg Oral TID  . enoxaparin (LOVENOX) injection  40 mg Subcutaneous Daily  . gabapentin  800 mg Oral BID  . insulin aspart  0-9 Units Subcutaneous Q4H  . insulin glargine  10 Units Subcutaneous Daily  . lamoTRIgine  100 mg Oral BID  . nicotine  14 mg Transdermal Daily  . pantoprazole  40 mg Oral Daily  . ramelteon  8 mg Oral QHS  . sodium chloride  10-40 mL  Intracatheter Q12H  . ziprasidone  160 mg Oral QHS   Continuous: . sodium chloride    . dextrose 5 % and 0.9 % NaCl with KCl 40 mEq/L 100 mL/hr at 07/18/13 1610    Assessment/Plan: 1) Right colonic mass. 2) Acalculous cholecystitis.   The patient is not prepped at this time.  She still has brown liquid.  She should be ready for the colonoscopy tomorrow.  I will finish her prep with a Miralax prep.  Plan: 1) Colonoscopy tomorrow.   LOS: 3 days   Malick Netz D 07/18/2013, 7:24 AM

## 2013-07-18 NOTE — Progress Notes (Signed)
Day of Surgery  Subjective: Pt very anxious this am.  Worried about having a colostomy, nervous about having to stay here so long, and the possibility of cancer.  Can't have colonoscopy today because BM's still brown and liquidy.  Pt's pain well controlled.  No N/V.  Many BM's and +flatus.  Ambulating some.  Wants to know if she can taker her dressing off her knee surgery wound.  Objective: Vital signs in last 24 hours: Temp:  [97.7 F (36.5 C)-98.5 F (36.9 C)] 98.5 F (36.9 C) (12/15 0459) Pulse Rate:  [59-66] 59 (12/15 0459) Resp:  [18-19] 18 (12/15 0459) BP: (105-134)/(69-86) 105/74 mmHg (12/15 0459) SpO2:  [96 %-99 %] 99 % (12/15 0459) Last BM Date: 07/17/13  Intake/Output from previous day: 12/14 0701 - 12/15 0700 In: 1523.3 [P.O.:700; I.V.:823.3] Out: 375 [Urine:375] Intake/Output this shift:    PE: Gen:  Alert, NAD, pleasant Abd: Soft, mild tenderness in the upper abdomen, ND, +BS, no HSM, horizontal abdominal c-section scars noted in lower abdomen Left knee - anterior knee with vertical incision with steri-strips in place   Lab Results:   Recent Labs  07/16/13 0625 07/17/13 0930  WBC 10.2 4.9  HGB 13.3 10.9*  HCT 38.1 31.3*  PLT 195 148*   BMET  Recent Labs  07/16/13 0625 07/17/13 0930  NA 139 142  K 3.5 3.0*  CL 101 112  CO2 27 23  GLUCOSE 108* 79  BUN 14 9  CREATININE 1.15* 0.86  CALCIUM 8.5 6.8*   PT/INR No results found for this basename: LABPROT, INR,  in the last 72 hours CMP     Component Value Date/Time   NA 142 07/17/2013 0930   K 3.0* 07/17/2013 0930   CL 112 07/17/2013 0930   CO2 23 07/17/2013 0930   GLUCOSE 79 07/17/2013 0930   BUN 9 07/17/2013 0930   CREATININE 0.86 07/17/2013 0930   CREATININE 1.38* 05/26/2013 1703   CALCIUM 6.8* 07/17/2013 0930   PROT 7.9 07/15/2013 1518   ALBUMIN 3.6 07/15/2013 1518   AST 18 07/15/2013 1518   ALT 15 07/15/2013 1518   ALKPHOS 131* 07/15/2013 1518   BILITOT 0.4 07/15/2013 1518   GFRNONAA 77* 07/17/2013 0930   GFRAA 89* 07/17/2013 0930   Lipase  No results found for this basename: lipase       Studies/Results: No results found.  Anti-infectives: Anti-infectives   None       Assessment/Plan Right colonic mass (circumferential) Acalculous cholecystitis, sludge with extrahepatic biliary ductal dilitation Bipolar disorder unspecified DM unspecified - Hgb A1c 6.9 Depression Hep C Elevated creatinine - normal yesterday Hypokalemia - 3.0 yesterday, on K with IVF, repeat BMET   Plan: 1.  Await colonoscopy prior to taking to OR, CSP planned for tomorrow afternoon.  Surgery could tentatively be planned for Wednesday if all goes well with CSP.  Will plan for colon resection and cholecystectomy. 2.  Clears only while prepping, NPO MN, pain control, antiemetics 3.  SCD's and lovenox (will need to hold prior to OR) 4.  Ambulate and IS 5.  D/c foley 6.  POD #5  left knee for a fatty tumor, okay to take off dressing and leave open to air    LOS: 3 days    DORT, Menashe Kafer 07/18/2013, 7:39 AM Pager: [phone]

## 2013-07-19 ENCOUNTER — Encounter (HOSPITAL_COMMUNITY)
Admission: EM | Disposition: A | Discharge status: Home / Self Care | Payer: Self-pay | Source: Home / Self Care | Attending: Family Medicine

## 2013-07-19 ENCOUNTER — Encounter (HOSPITAL_COMMUNITY): Payer: Self-pay | Admitting: *Deleted

## 2013-07-19 DIAGNOSIS — K819 Cholecystitis, unspecified: Secondary | ICD-10-CM

## 2013-07-19 HISTORY — PX: COLONOSCOPY: SHX5424

## 2013-07-19 LAB — BASIC METABOLIC PANEL
CO2: 20 mEq/L (ref 19–32)
Chloride: 109 mEq/L (ref 96–112)
Creatinine, Ser: 0.8 mg/dL (ref 0.50–1.10)
Glucose, Bld: 190 mg/dL — ABNORMAL HIGH (ref 70–99)
Sodium: 139 mEq/L (ref 135–145)

## 2013-07-19 LAB — GLUCOSE, CAPILLARY
Glucose-Capillary: 150 mg/dL — ABNORMAL HIGH (ref 70–99)
Glucose-Capillary: 165 mg/dL — ABNORMAL HIGH (ref 70–99)
Glucose-Capillary: 117 mg/dL — ABNORMAL HIGH (ref 70–99)

## 2013-07-19 SURGERY — COLONOSCOPY
Anesthesia: Moderate Sedation

## 2013-07-19 MED ORDER — HYDROMORPHONE HCL PF 1 MG/ML IJ SOLN: 1.0000 mg | INTRAMUSCULAR | Status: DC | PRN

## 2013-07-19 MED ORDER — DIPHENHYDRAMINE HCL 50 MG/ML IJ SOLN
INTRAMUSCULAR | Status: AC
  Filled 2013-07-19: qty 1

## 2013-07-19 MED ORDER — HYDROMORPHONE HCL PF 1 MG/ML IJ SOLN
1.0000 mg | INTRAMUSCULAR | Status: DC | PRN
  Administered 2013-07-19 – 2013-07-20 (×5): 1 mg via INTRAVENOUS
  Filled 2013-07-19 (×5): qty 1

## 2013-07-19 MED ORDER — PROMETHAZINE HCL 25 MG/ML IJ SOLN
INTRAMUSCULAR | Status: DC | PRN
  Administered 2013-07-19: 25 mg via INTRAVENOUS

## 2013-07-19 MED ORDER — SPOT INK MARKER SYRINGE KIT
PACK | SUBMUCOSAL | Status: AC
  Filled 2013-07-19: qty 5

## 2013-07-19 MED ORDER — NICOTINE 21 MG/24HR TD PT24
21.0000 mg | MEDICATED_PATCH | Freq: Every day | TRANSDERMAL | Status: DC
  Administered 2013-07-19 – 2013-07-25 (×7): 21 mg via TRANSDERMAL
  Filled 2013-07-19 (×8): qty 1

## 2013-07-19 MED ORDER — FENTANYL CITRATE 0.05 MG/ML IJ SOLN
INTRAMUSCULAR | Status: DC | PRN
  Administered 2013-07-19 (×4): 25 ug via INTRAVENOUS

## 2013-07-19 MED ORDER — MIDAZOLAM HCL 5 MG/5ML IJ SOLN
INTRAMUSCULAR | Status: DC | PRN
  Administered 2013-07-19 (×5): 2 mg via INTRAVENOUS

## 2013-07-19 MED ORDER — SPOT INK MARKER SYRINGE KIT
PACK | SUBMUCOSAL | Status: DC | PRN
  Administered 2013-07-19: 4.5 mL via SUBMUCOSAL

## 2013-07-19 MED ORDER — FENTANYL CITRATE 0.05 MG/ML IJ SOLN
INTRAMUSCULAR | Status: AC
  Filled 2013-07-19: qty 4

## 2013-07-19 MED ORDER — MIDAZOLAM HCL 5 MG/ML IJ SOLN
INTRAMUSCULAR | Status: AC
  Filled 2013-07-19: qty 2

## 2013-07-19 NOTE — Progress Notes (Signed)
I spoke with Dr. Elnoria Howard. R colon mass C/W cancer. Sessile polyp very benign in appearance. Will plan R colectomy, cholecystectomy tomorrow. Procedure, risks, benefits D/W patient. She agrees. I also called her mother per her request. Violeta Gelinas, MD, MPH, FACS Pager: [phone]

## 2013-07-19 NOTE — Progress Notes (Signed)
1 Day Post-Op  Subjective: Patient up ambulating in her room.  Noticeably anxious.  Pain minimal, no N/V.  Tolerated clears and prep yesterday, BM's more clear.  CSP today.  Anxious for OR tomorrow.    Objective: Vital signs in last 24 hours: Temp:  [97.4 F (36.3 C)-98.9 F (37.2 C)] 97.9 F (36.6 C) (12/16 0536) Pulse Rate:  [50-63] 50 (12/16 0536) Resp:  [16-18] 18 (12/16 0536) BP: (104-127)/(61-66) 104/61 mmHg (12/16 0536) SpO2:  [93 %-98 %] 98 % (12/16 0536) Last BM Date: 07/18/13  Intake/Output from previous day: 12/15 0701 - 12/16 0700 In: 2973.3 [P.O.:600; I.V.:2373.3] Out: 300 [Urine:300] Intake/Output this shift:    PE: Gen:  Alert, NAD, pleasant Abd: Soft, mild tenderness in the upper abdomen, ND, +BS, no HSM, horizontal abdominal c-section scars noted in lower abdomen  Left knee - anterior knee with vertical incision with steri-strips in place, some edema around incision site   Lab Results:   Recent Labs  07/17/13 0930  WBC 4.9  HGB 10.9*  HCT 31.3*  PLT 148*   BMET  Recent Labs  07/17/13 0930  NA 142  K 3.0*  CL 112  CO2 23  GLUCOSE 79  BUN 9  CREATININE 0.86  CALCIUM 6.8*   PT/INR No results found for this basename: LABPROT, INR,  in the last 72 hours CMP     Component Value Date/Time   NA 142 07/17/2013 0930   K 3.0* 07/17/2013 0930   CL 112 07/17/2013 0930   CO2 23 07/17/2013 0930   GLUCOSE 79 07/17/2013 0930   BUN 9 07/17/2013 0930   CREATININE 0.86 07/17/2013 0930   CREATININE 1.38* 05/26/2013 1703   CALCIUM 6.8* 07/17/2013 0930   PROT 7.9 07/15/2013 1518   ALBUMIN 3.6 07/15/2013 1518   AST 18 07/15/2013 1518   ALT 15 07/15/2013 1518   ALKPHOS 131* 07/15/2013 1518   BILITOT 0.4 07/15/2013 1518   GFRNONAA 77* 07/17/2013 0930   GFRAA 89* 07/17/2013 0930   Lipase  No results found for this basename: lipase       Studies/Results: No results found.  Anti-infectives: Anti-infectives   None        Assessment/Plan Right colonic mass (circumferential)  Acalculous cholecystitis, sludge with extrahepatic biliary ductal dilitation  Bipolar disorder unspecified  DM unspecified - Hgb A1c 6.9  Depression  Hep C  Elevated creatinine - normal yesterday  Hypokalemia - 3.0 - 2 days ago, on K with IVF, pending repeat BMET   Plan:  1. Await colonoscopy prior to taking to OR, CSP planned for this afternoon. Surgery could tentatively be planned for Wednesday if all goes well with CSP. Will plan for colon resection and cholecystectomy.  2. Clears ok then NPO MN, pain control, antiemetics  3. SCD's and lovenox (will need to hold prior to OR)  4. Ambulate and IS  5. POD #6 left knee for a fatty tumor, okay to take off dressing and leave open to air, leave steri-strips in place     LOS: 4 days    DORT, Sahaana Weitman 07/19/2013, 8:30 AM Pager: [phone]

## 2013-07-19 NOTE — H&P (View-Only) (Signed)
Family Medicine Teaching Service Daily Progress Note Intern Pager: [phone]  Patient name: Monica [name] Medical record number: [id number] Date of birth: May 13, 1961 Age: 52 y.o. Gender: female  Primary Care Provider: Mat Carne, MD Consultants: general surgery, GI Code Status: full  Pt Overview and Major Events to Date: 12/12 - admitted with bowel obstruction due to colon mass and likely acalculous cholecystis 12/15 - continue bowel prep today, plan colonoscopy (12/16), tentative surgery (12/17)  Assessment and Plan: Monica [name] is a 52 y.o. female presenting with severe right sided abdominal pain with large bowel obstruction from a mass at the ileocecal junction as well as acalculous cholecystitis. PMH is significant for Type 1 DM, bipolar disorder, hypertension, hepatitis C, history IV drug use, and recent knee surgery.   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 Presumed carcinoma based upon CT and pt without history of prior colon cancer screening. On admission, noted not passing stool or flatus, has improved and currently with loose stools. - Pain Management:     - Morphine 6 mg IV q 3 PRN pain (received 6x doses 24 hrs, total 36mg  Morphine IV), currently received 4x today - c/s General Surgery, appreciate all recommendations with management    - plan for tentative surgery for colon resection (removal of R-ileocecal mass) and cholecystectomy    - pending colonoscopy prior to proceeding with surgery (possible Weds 12/17)    - clears only while bowel prep, NPO after midnight    - pain control, anti-emetics, SCDs and lovenox (hold prior to OR)    - CEA 2.2 (normal range) - c/s GI, appreciate all recommendations with management    - continue with bowel prep (today with  brown liquid stools, not fully prepped)    - Miralax x 1 bottle prep today    - scheduled colonoscopy tomorrow (12/16)  # Acalculous cholecystitis, sludge with extrahepatic biliary ductal dilatation Abdominal US with noted sludge and gall bladder wall thickening. - c/s General Surgery    - tentative plan for cholecystectomy in addition to colectomy    - see management above under plan for R-colon mass  # Type 1 DM History of very brittle diabetes. 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, no evidence of infection or post-operative complication  - Keep dressing in place with steri-strips, reportedly able to remove strips  # Bipolar Disorder/Anxiety  - Continue home geodon, lamictal, clonazepam, atarax, and ramelteon   # Nicotine Abuse - smokes 1 ppd  - Nicotine patch 14 gm   # HLD  -  home atorvastatin (hold if desired while NPO)  # Hypokalemia K+ 3.0, replaced with K+ with IVF - f/u BMET  FEN/GI: NS @ 100 ml/hr; NPO except meds and ice chips  Prophylaxis: lovenox subcutaneous    Disposition: Admitted to FPTS, inpatient status, pending complete work-up and surgical interventions with recovery and improved clinical course, expect patient to be discharged to home after multiple days.  Subjective: No acute events overnight. Today she reports that she still has significant Right sided abdominal pain (difficult to determine  if improved from yesterday), but overall improved since admission. Changing positions bothers it most, but once she stands up she is able to walk okay. Continues bowel prep clearing (brown liquid stools). Denies fevers, chills, nausea, vomiting.  Objective: Temp:  [97.4 F (36.3 C)-98.5 F (36.9 C)] 97.4 F (36.3 C)  (12/15 1342) Pulse Rate:  [59-66] 63 (12/15 1342) Resp:  [16-19] 16 (12/15 1342) BP: (105-134)/(66-86) 113/66 mmHg (12/15 1342) SpO2:  [93 %-99 %] 93 % (12/15 1342) Physical Exam: General: middle aged female, anxious and distressed appearing, conversational but mild-mod distress due to abd pain Cardiovascular: RRR, no murmurs  Respiratory: normal WOB, CTA-B  Abdomen: soft, with moderate distention, tender to light to moderate palpation RUQ / RLQ, with some generalized tenderness over Left abdomen. +active BS  Extremities: left knee (recent surgery, 1 week ago) steri-strips, no drainage noted  Skin: no rashes  Neuro: alert, oriented, no focal deficits  Laboratory:  Recent Labs Lab 07/15/13 1518 07/16/13 0625 07/17/13 0930  WBC 9.9 10.2 4.9  HGB 16.0* 13.3 10.9*  HCT 45.7 38.1 31.3*  PLT 245 195 148*    Recent Labs Lab 07/15/13 1518 07/16/13 0625 07/17/13 0930  NA 143 139 142  K 3.8 3.5 3.0*  CL 103 101 112  CO2 27 27 23   BUN 14 14 9   CREATININE 1.21* 1.15* 0.86  CALCIUM 9.4 8.5 6.8*  PROT 7.9  --   --   BILITOT 0.4  --   --   ALKPHOS 131*  --   --   ALT 15  --   --   AST 18  --   --   GLUCOSE 186* 108* 79    CEA 2.2 (normal)  Imaging/Diagnostic Tests: 07/15/13 CT Abdomen IMPRESSION: Changes in the right lower quadrant consistent with a colon carcinoma with circumferential wall thickening and a degree of obstructive change with dilatation of the cecum and distal terminal ileum with significant to the fecal material. Further evaluation by means of colonoscopy is recommended. A mild amount of ascites is noted. Dilatation of the gallbladder with dilation of the common bile duct 1 cm as described. This is of uncertain etiology. Clinical correlation with laboratory values is recommended.  07/15/13 US Abdomen Complete  IMPRESSION: Gallbladder sludge with positive sonographic Murphy's sign. No calculi are observed. Acalculous cholecystitis not excluded. Extrahepatic  biliary ductal dilatation is also present.   Saralyn Pilar, DO 07/18/2013, 2:34 PM PGY-1, [facility] FPTS Intern pager: [phone], text pages welcome

## 2013-07-19 NOTE — Interval H&P Note (Signed)
History and Physical Interval Note:  07/19/2013 1:19 PM  Monica [name]  has presented today for surgery, with the diagnosis of right colon mass  The various methods of treatment have been discussed with the patient and family. After consideration of risks, benefits and other options for treatment, the patient has consented to  Procedure(s): COLONOSCOPY (N/A) as a surgical intervention .  The patient's history has been reviewed, patient examined, no change in status, stable for surgery.  I have reviewed the patient's chart and labs.  Questions were answered to the patient's satisfaction.     Elizabet Schweppe D

## 2013-07-19 NOTE — Progress Notes (Signed)
Family Medicine Teaching Service Daily Progress Note Intern Pager: [phone]  Patient name: Monica [name] Medical record number: [id number] Date of birth: 11/01/1960 Age: 52 y.o. Gender: female  Primary Care Provider: Mat Carne, MD Consultants: general surgery, GI Code Status: full  Pt Overview and Major Events to Date: 12/12 - admitted with bowel obstruction due to colon mass and likely acalculous cholecystis 12/15 - continue bowel prep today, plan colonoscopy (12/16), tentative surgery (12/17) 12/16 - colonoscopy (pending results)  Assessment and Plan: Monica [name] is a 52 y.o. female presenting with severe right sided abdominal pain with large bowel obstruction from a mass at the ileocecal junction as well as acalculous cholecystitis. PMH is significant for Type 1 DM, bipolar disorder, hypertension, hepatitis C, history IV drug use, and recent knee surgery.   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 Presumed carcinoma based upon CT and pt without history of prior colon cancer screening. On admission, noted not passing stool or flatus, has improved and currently with loose stools. - Pain Management:     - Morphine 6 mg IV q 3 PRN pain (received 7x doses 24 hrs, total 42mg  Morphine IV), currently received 4x today     - switch from Morphine to Dilaudid IV 1mg  q 4 hr PRN mod-severe pain, d/t persistent abdominal pain and questionable coverage with morphine - c/s General Surgery, appreciate all recommendations with management    - plan for tentative surgery for colon resection (removal of R-ileocecal mass) and cholecystectomy    - colonoscopy today prior to proceeding with surgery (possible Weds 12/17)    - clears only while bowel prep, NPO after midnight    - pain  control, anti-emetics, SCDs and lovenox (hold prior to OR)    - CEA 2.2 (normal range) - c/s GI, appreciate all recommendations with management    - s/p Miralax 1 bottle yesterday, improved clear liquid stools, suspect prepped prior to colonoscopy today    - f/u colonoscopy (12/16) results  # Acalculous cholecystitis, sludge with extrahepatic biliary ductal dilatation Abdominal US with noted sludge and gall bladder wall thickening. - c/s General Surgery    - tentative plan for cholecystectomy in addition to colectomy    - see management above under plan for R-colon mass  # Type 1 DM History of very brittle diabetes. 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 Held in hospital - noted mild inc b/l LE edema, decide against continuing Lasix for symptom control in setting of pt w/o CHF  FEN/GI:  D5-NS @ 100 ml/hr; NPO except meds and ice chips  Prophylaxis: lovenox subcutaneous   Disposition: Admitted to FPTS, inpatient status, pending complete work-up and surgical interventions with recovery and improved clinical course, expect patient to be discharged to home after multiple days.  Subjective: No acute events overnight. Today she reports that her  Right sided abdominal pain is essentially unchanged, still endorsing significant abdominal pain, however notes her pain is not getting worse. Expresses significant anxiety over pain and upcoming surgery. Tolerated bowel prep well with more clear liquid BMs, ready for colonoscopy today. Denies any new concerns.  Objective: Temp:  [97.4 F (36.3 C)-98.9 F (37.2 C)] 97.9 F (36.6 C) (12/16 0536) Pulse Rate:  [50-63] 50 (12/16 0536) Resp:  [16-18] 18 (12/16 0536) BP: (104-127)/(61-66) 104/61 mmHg (12/16 0536) SpO2:  [93 %-98 %] 98 % (12/16 0536) Physical Exam: General: middle aged female, anxious, conversational, endorses persistent abd pain but NAD Cardiovascular: RRR, no murmurs  Respiratory: CTAB Abdomen: soft, with moderate distention (stable from yesterday), tender to light to moderate palpation RUQ / RLQ, with some generalized tenderness over Left abdomen. +active BS  Extremities: b/l LE ankles with mild +1 non-pitting edema, left knee s/p surgery, steri-strips, no drainage noted  Skin: no rashes  Neuro: alert, oriented, no focal deficits  Laboratory:  Recent Labs Lab 07/15/13 1518 07/16/13 0625 07/17/13 0930  WBC 9.9 10.2 4.9  HGB 16.0* 13.3 10.9*  HCT 45.7 38.1 31.3*  PLT 245 195 148*    Recent Labs Lab 07/15/13 1518 07/16/13 0625 07/17/13 0930  NA 143 139 142  K 3.8 3.5 3.0*  CL 103 101 112  CO2 27 27 23   BUN 14 14 9   CREATININE 1.21* 1.15* 0.86  CALCIUM 9.4 8.5 6.8*  PROT 7.9  --   --   BILITOT 0.4  --   --   ALKPHOS 131*  --   --   ALT 15  --   --   AST 18  --   --   GLUCOSE 186* 108* 79    CEA 2.2 (normal)  Imaging/Diagnostic Tests: 07/15/13 CT Abdomen IMPRESSION: Changes in the right lower quadrant consistent with a colon carcinoma with circumferential wall thickening and a degree of obstructive change with dilatation of the cecum and distal terminal ileum with significant to the fecal material. Further evaluation by means of colonoscopy is  recommended. A mild amount of ascites is noted. Dilatation of the gallbladder with dilation of the common bile duct 1 cm as described. This is of uncertain etiology. Clinical correlation with laboratory values is recommended.  07/15/13 US Abdomen Complete  IMPRESSION: Gallbladder sludge with positive sonographic Murphy's sign. No calculi are observed. Acalculous cholecystitis not excluded. Extrahepatic biliary ductal dilatation is also present.  07/19/13 Colonoscopy Pending  Saralyn Pilar, DO 07/19/2013, 8:16 AM PGY-1, [facility] FPTS Intern pager: [phone], text pages welcome

## 2013-07-19 NOTE — ED Provider Notes (Signed)
Medical screening examination/treatment/procedure(s) were performed by non-physician practitioner and as supervising physician I was immediately available for consultation/collaboration.  EKG Interpretation    Date/Time:    Ventricular Rate:    PR Interval:    QRS Duration:   QT Interval:    QTC Calculation:   R Axis:     Text Interpretation:               Flint Melter, MD 07/19/13 [phone]

## 2013-07-19 NOTE — Op Note (Signed)
[facility] [address] [place], [address]   OPERATIVE PROCEDURE REPORT  PATIENT: Monica [name], Monica [name]  MR#: [id number] BIRTHDATE: Feb 01, 1961  GENDER: Female ENDOSCOPIST: Jeani Hawking, MD ASSISTANT:   Priscella Mann, technician Dwain Sarna, RN CGRN PROCEDURE DATE: 07/19/2013 PROCEDURE:   Colonoscopy with snare, biopsy, and injection. ASA CLASS:   Class III INDICATIONS:Right colon mass. MEDICATIONS: Fentanyl 100 mcg IV, Versed 10 mg IV, and Benadryl 25 mg IV  DESCRIPTION OF PROCEDURE:   After the risks benefits and alternatives of the procedure were thoroughly explained, informed consent was obtained.  A digital rectal exam revealed no abnormalities of the rectum.    The Pentax Adult Colon [id number] endoscope was introduced through the anus  and advanced to the ascending colon , No adverse events experienced.    The quality of the prep was good. .  The instrument was then slowly withdrawn as the colon was fully examined.     FINDINGS: In the proximal colon a malignant stricture was identified.  I was not able to pass the colonoscope through the stricture was it was very tight and patient tolerance was poor. Cold biopsies of the distal lesion were obtained.  The area was very hard and ulcerated.  The site was tattooed with SPOT.  In the transverse colon a 4 mm sessile polyp was removed with a cold snare.  No other abnormalities noted in the colon.          The scope was then withdrawn from the patient and the procedure terminated.  COMPLICATIONS: There were no complications.  IMPRESSION: 1) Right colon mass with stricture. 2) Colonic polyp.  RECOMMENDATIONS: 1) Await biopsy results. 2) Management per Surgery with mass and her gallbladder.  _______________________________ eSigned:  Jeani Hawking, MD 07/19/2013 2:09 PM

## 2013-07-19 NOTE — Progress Notes (Signed)
FMTS Attending Note Patient seen and examined by me today, I discussed with resident team and I agree with Dr Althea Charon' assessment and plan for today.  Paula Compton, MD

## 2013-07-19 NOTE — H&P (View-Only) (Signed)
FMTS Attending Note Patient seen and examined by me, discussed with resident team and I agree with Dr Karamalegos' assessment and plan as per this note.  Patient undergoing bowel prep in anticipation for colonoscopy and hemicolectomy/CCY surgery for ileocecal mass.  Daksha Koone, MD 

## 2013-07-20 ENCOUNTER — Encounter (HOSPITAL_COMMUNITY): Payer: Medicaid Other | Admitting: Anesthesiology

## 2013-07-20 ENCOUNTER — Encounter (HOSPITAL_COMMUNITY): Payer: Self-pay | Admitting: Gastroenterology

## 2013-07-20 ENCOUNTER — Encounter (HOSPITAL_COMMUNITY)
Admission: EM | Disposition: A | Discharge status: Home / Self Care | Payer: Self-pay | Source: Home / Self Care | Attending: Family Medicine

## 2013-07-20 ENCOUNTER — Inpatient Hospital Stay (HOSPITAL_COMMUNITY): Payer: Medicaid Other | Admitting: Anesthesiology

## 2013-07-20 DIAGNOSIS — K811 Chronic cholecystitis: Secondary | ICD-10-CM

## 2013-07-20 DIAGNOSIS — C189 Malignant neoplasm of colon, unspecified: Secondary | ICD-10-CM

## 2013-07-20 DIAGNOSIS — D134 Benign neoplasm of liver: Secondary | ICD-10-CM

## 2013-07-20 DIAGNOSIS — D135 Benign neoplasm of extrahepatic bile ducts: Secondary | ICD-10-CM

## 2013-07-20 HISTORY — PX: CHOLECYSTECTOMY: SHX55

## 2013-07-20 HISTORY — PX: PARTIAL COLECTOMY: SHX5273

## 2013-07-20 LAB — GLUCOSE, CAPILLARY
Glucose-Capillary: 137 mg/dL — ABNORMAL HIGH (ref 70–99)
Glucose-Capillary: 165 mg/dL — ABNORMAL HIGH (ref 70–99)
Glucose-Capillary: 185 mg/dL — ABNORMAL HIGH (ref 70–99)
Glucose-Capillary: 173 mg/dL — ABNORMAL HIGH (ref 70–99)
Glucose-Capillary: 243 mg/dL — ABNORMAL HIGH (ref 70–99)
Glucose-Capillary: 231 mg/dL — ABNORMAL HIGH (ref 70–99)
Glucose-Capillary: 273 mg/dL — ABNORMAL HIGH (ref 70–99)

## 2013-07-20 LAB — CBC
HCT: 31 % — ABNORMAL LOW (ref 36.0–46.0)
Hemoglobin: 10.4 g/dL — ABNORMAL LOW (ref 12.0–15.0)
MCHC: 33.5 g/dL (ref 30.0–36.0)
Platelets: 187 10*3/uL (ref 150–400)
RBC: 3.61 MIL/uL — ABNORMAL LOW (ref 3.87–5.11)
WBC: 4.1 10*3/uL (ref 4.0–10.5)

## 2013-07-20 LAB — BASIC METABOLIC PANEL
Chloride: 110 mEq/L (ref 96–112)
GFR calc Af Amer: 89 mL/min — ABNORMAL LOW (ref >90)
GFR calc non Af Amer: 77 mL/min — ABNORMAL LOW (ref >90)
Glucose, Bld: 177 mg/dL — ABNORMAL HIGH (ref 70–99)
Potassium: 4.2 mEq/L (ref 3.5–5.1)

## 2013-07-20 SURGERY — COLECTOMY, PARTIAL
Anesthesia: General | Site: Abdomen | Laterality: Right

## 2013-07-20 MED ORDER — LIDOCAINE HCL (CARDIAC) 20 MG/ML IV SOLN
INTRAVENOUS | Status: DC | PRN
  Administered 2013-07-20: 60 mg via INTRAVENOUS

## 2013-07-20 MED ORDER — HYDROMORPHONE HCL PF 1 MG/ML IJ SOLN
0.2500 mg | INTRAMUSCULAR | Status: DC | PRN
  Administered 2013-07-20 (×4): 0.5 mg via INTRAVENOUS

## 2013-07-20 MED ORDER — ONDANSETRON HCL 4 MG/2ML IJ SOLN
4.0000 mg | Freq: Four times a day (QID) | INTRAMUSCULAR | Status: DC | PRN
Start: 2013-07-20 — End: 2013-07-20

## 2013-07-20 MED ORDER — DEXTROSE 5 % IV SOLN
2.0000 g | Freq: Once | INTRAVENOUS | Status: AC
  Administered 2013-07-20: 2 g via INTRAVENOUS
  Filled 2013-07-20: qty 2

## 2013-07-20 MED ORDER — HYDROMORPHONE 0.3 MG/ML IV SOLN
INTRAVENOUS | Status: DC
  Administered 2013-07-20: 1.5 mg via INTRAVENOUS
  Administered 2013-07-20: 15:00:00 via INTRAVENOUS
  Administered 2013-07-20: 1.5 mg via INTRAVENOUS
  Administered 2013-07-21: 2.1 mg via INTRAVENOUS
  Administered 2013-07-21: 0.9 mg via INTRAVENOUS
  Administered 2013-07-21: 1.8 mg via INTRAVENOUS
  Administered 2013-07-21: 1.5 mg via INTRAVENOUS
  Administered 2013-07-21: 0.9 mg via INTRAVENOUS
  Administered 2013-07-21: 10:00:00 via INTRAVENOUS
  Administered 2013-07-21: 0.9 mg via INTRAVENOUS
  Administered 2013-07-22: 1.7 mg via INTRAVENOUS
  Administered 2013-07-22: 08:00:00 via INTRAVENOUS
  Administered 2013-07-22: 1.2 mg via INTRAVENOUS
  Filled 2013-07-20 (×2): qty 25

## 2013-07-20 MED ORDER — HYDROMORPHONE 0.3 MG/ML IV SOLN
INTRAVENOUS | Status: AC
  Filled 2013-07-20: qty 25

## 2013-07-20 MED ORDER — NEOSTIGMINE METHYLSULFATE 1 MG/ML IJ SOLN
INTRAMUSCULAR | Status: DC | PRN
  Administered 2013-07-20: 3 mg via INTRAVENOUS

## 2013-07-20 MED ORDER — HYDROMORPHONE HCL PF 1 MG/ML IJ SOLN
INTRAMUSCULAR | Status: DC | PRN
  Administered 2013-07-20: 1 mg via INTRAVENOUS

## 2013-07-20 MED ORDER — METRONIDAZOLE IN NACL 5-0.79 MG/ML-% IV SOLN
500.0000 mg | Freq: Once | INTRAVENOUS | Status: AC
  Administered 2013-07-20: 500 mg via INTRAVENOUS
  Filled 2013-07-20: qty 100

## 2013-07-20 MED ORDER — ONDANSETRON HCL 4 MG/2ML IJ SOLN
INTRAMUSCULAR | Status: DC | PRN
  Administered 2013-07-20: 4 mg via INTRAVENOUS

## 2013-07-20 MED ORDER — SODIUM CHLORIDE 0.9 % IJ SOLN: 9.0000 mL | INTRAMUSCULAR | Status: DC | PRN

## 2013-07-20 MED ORDER — OXYCODONE HCL 5 MG/5ML PO SOLN: 5.0000 mg | Freq: Once | ORAL | Status: DC | PRN

## 2013-07-20 MED ORDER — DIPHENHYDRAMINE HCL 50 MG/ML IJ SOLN: 12.5000 mg | Freq: Four times a day (QID) | INTRAMUSCULAR | Status: DC | PRN

## 2013-07-20 MED ORDER — DIPHENHYDRAMINE HCL 12.5 MG/5ML PO ELIX: 12.5000 mg | ORAL_SOLUTION | Freq: Four times a day (QID) | ORAL | Status: DC | PRN

## 2013-07-20 MED ORDER — GLYCOPYRROLATE 0.2 MG/ML IJ SOLN
INTRAMUSCULAR | Status: DC | PRN
  Administered 2013-07-20: 0.4 mg via INTRAVENOUS

## 2013-07-20 MED ORDER — LACTATED RINGERS IV SOLN
INTRAVENOUS | Status: DC
  Administered 2013-07-20 (×2): via INTRAVENOUS

## 2013-07-20 MED ORDER — MIDAZOLAM HCL 5 MG/5ML IJ SOLN
INTRAMUSCULAR | Status: DC | PRN
  Administered 2013-07-20 (×2): 2 mg via INTRAVENOUS

## 2013-07-20 MED ORDER — OXYCODONE HCL 5 MG PO TABS: 5.0000 mg | ORAL_TABLET | Freq: Once | ORAL | Status: DC | PRN

## 2013-07-20 MED ORDER — LAMOTRIGINE 200 MG PO TABS
200.0000 mg | ORAL_TABLET | Freq: Every day | ORAL | Status: DC
  Administered 2013-07-21 – 2013-07-25 (×5): 200 mg via ORAL
  Filled 2013-07-20 (×5): qty 1

## 2013-07-20 MED ORDER — HYDROMORPHONE HCL PF 1 MG/ML IJ SOLN
INTRAMUSCULAR | Status: AC
  Administered 2013-07-20: 0.5 mg via INTRAVENOUS
  Filled 2013-07-20: qty 2

## 2013-07-20 MED ORDER — PROPOFOL 10 MG/ML IV BOLUS
INTRAVENOUS | Status: DC | PRN
  Administered 2013-07-20: 180 mg via INTRAVENOUS

## 2013-07-20 MED ORDER — 0.9 % SODIUM CHLORIDE (POUR BTL) OPTIME
TOPICAL | Status: DC | PRN
  Administered 2013-07-20 (×3): 1000 mL

## 2013-07-20 MED ORDER — NALOXONE HCL 0.4 MG/ML IJ SOLN: 0.4000 mg | INTRAMUSCULAR | Status: DC | PRN

## 2013-07-20 MED ORDER — FENTANYL CITRATE 0.05 MG/ML IJ SOLN
INTRAMUSCULAR | Status: DC | PRN
  Administered 2013-07-20: 100 ug via INTRAVENOUS
  Administered 2013-07-20: 50 ug via INTRAVENOUS
  Administered 2013-07-20 (×3): 100 ug via INTRAVENOUS
  Administered 2013-07-20: 150 ug via INTRAVENOUS
  Administered 2013-07-20: 50 ug via INTRAVENOUS
  Administered 2013-07-20 (×2): 100 ug via INTRAVENOUS
  Administered 2013-07-20: 150 ug via INTRAVENOUS

## 2013-07-20 MED ORDER — ROCURONIUM BROMIDE 100 MG/10ML IV SOLN
INTRAVENOUS | Status: DC | PRN
  Administered 2013-07-20: 30 mg via INTRAVENOUS
  Administered 2013-07-20: 10 mg via INTRAVENOUS
  Administered 2013-07-20: 50 mg via INTRAVENOUS
  Administered 2013-07-20: 10 mg via INTRAVENOUS

## 2013-07-20 MED ORDER — ONDANSETRON HCL 4 MG/2ML IJ SOLN
4.0000 mg | Freq: Four times a day (QID) | INTRAMUSCULAR | Status: DC | PRN
  Administered 2013-07-20 – 2013-07-21 (×3): 4 mg via INTRAVENOUS
  Filled 2013-07-20: qty 2

## 2013-07-20 SURGICAL SUPPLY — 61 items
BLADE SURG ROTATE 9660 (MISCELLANEOUS) IMPLANT
CANISTER SUCTION 2500CC (MISCELLANEOUS) ×3 IMPLANT
CHLORAPREP W/TINT 26ML (MISCELLANEOUS) ×3 IMPLANT
COVER MAYO STAND STRL (DRAPES) ×5 IMPLANT
COVER SURGICAL LIGHT HANDLE (MISCELLANEOUS) ×3 IMPLANT
DRAPE LAPAROSCOPIC ABDOMINAL (DRAPES) ×3 IMPLANT
DRAPE PROXIMA HALF (DRAPES) ×5 IMPLANT
DRAPE UTILITY 15X26 W/TAPE STR (DRAPE) ×15 IMPLANT
DRAPE WARM FLUID 44X44 (DRAPE) ×3 IMPLANT
DRESSING TELFA 8X3 (GAUZE/BANDAGES/DRESSINGS) ×1 IMPLANT
DRSG OPSITE POSTOP 4X10 (GAUZE/BANDAGES/DRESSINGS) ×1 IMPLANT
DRSG OPSITE POSTOP 4X8 (GAUZE/BANDAGES/DRESSINGS) IMPLANT
ELECT BLADE 6.5 EXT (BLADE) ×3 IMPLANT
ELECT CAUTERY BLADE 6.4 (BLADE) ×6 IMPLANT
ELECT REM PT RETURN 9FT ADLT (ELECTROSURGICAL) ×3
ELECTRODE REM PT RTRN 9FT ADLT (ELECTROSURGICAL) ×2 IMPLANT
GLOVE BIO SURGEON STRL SZ7 (GLOVE) ×1 IMPLANT
GLOVE BIO SURGEON STRL SZ8 (GLOVE) ×6 IMPLANT
GLOVE BIOGEL PI IND STRL 7.0 (GLOVE) IMPLANT
GLOVE BIOGEL PI IND STRL 8 (GLOVE) ×4 IMPLANT
GLOVE BIOGEL PI INDICATOR 7.0 (GLOVE) ×6
GLOVE BIOGEL PI INDICATOR 8 (GLOVE) ×4
GLOVE ECLIPSE 7.5 STRL STRAW (GLOVE) ×2 IMPLANT
GLOVE SURG SS PI 7.0 STRL IVOR (GLOVE) ×1 IMPLANT
GOWN STRL NON-REIN LRG LVL3 (GOWN DISPOSABLE) ×14 IMPLANT
GOWN STRL REIN XL XLG (GOWN DISPOSABLE) ×6 IMPLANT
KIT BASIN OR (CUSTOM PROCEDURE TRAY) ×3 IMPLANT
KIT ROOM TURNOVER OR (KITS) ×3 IMPLANT
LEGGING LITHOTOMY PAIR STRL (DRAPES) ×2 IMPLANT
LIGASURE IMPACT 36 18CM CVD LR (INSTRUMENTS) ×3 IMPLANT
NS IRRIG 1000ML POUR BTL (IV SOLUTION) ×6 IMPLANT
PACK GENERAL/GYN (CUSTOM PROCEDURE TRAY) ×3 IMPLANT
PAD ARMBOARD 7.5X6 YLW CONV (MISCELLANEOUS) ×6 IMPLANT
PENCIL BUTTON HOLSTER BLD 10FT (ELECTRODE) ×3 IMPLANT
RELOAD PROXIMATE 75MM BLUE (ENDOMECHANICALS) ×6 IMPLANT
RELOAD STAPLE 75 3.8 BLU REG (ENDOMECHANICALS) IMPLANT
SPECIMEN JAR SMALL (MISCELLANEOUS) ×4 IMPLANT
SPECIMEN JAR X LARGE (MISCELLANEOUS) ×3 IMPLANT
SPONGE INTESTINAL PEANUT (DISPOSABLE) ×1 IMPLANT
SPONGE LAP 18X18 X RAY DECT (DISPOSABLE) ×2 IMPLANT
STAPLER GUN LINEAR PROX 60 (STAPLE) ×1 IMPLANT
STAPLER PROXIMATE 75MM BLUE (STAPLE) ×2 IMPLANT
STAPLER VISISTAT 35W (STAPLE) ×3 IMPLANT
SUCTION POOLE TIP (SUCTIONS) ×3 IMPLANT
SURGILUBE 2OZ TUBE FLIPTOP (MISCELLANEOUS) IMPLANT
SUT PDS AB 1 TP1 96 (SUTURE) ×6 IMPLANT
SUT PROLENE 2 0 CT2 30 (SUTURE) IMPLANT
SUT PROLENE 2 0 KS (SUTURE) IMPLANT
SUT SILK 2 0 SH CR/8 (SUTURE) ×3 IMPLANT
SUT SILK 2 0 TIES 10X30 (SUTURE) ×3 IMPLANT
SUT SILK 3 0 SH CR/8 (SUTURE) ×3 IMPLANT
SUT SILK 3 0 TIES 10X30 (SUTURE) ×3 IMPLANT
SUT VIC AB 3-0 SH 18 (SUTURE) IMPLANT
SYR BULB IRRIGATION 50ML (SYRINGE) ×3 IMPLANT
TOWEL OR 17X26 10 PK STRL BLUE (TOWEL DISPOSABLE) ×5 IMPLANT
TRAY FOLEY CATH 14FRSI W/METER (CATHETERS) ×3 IMPLANT
TRAY PROCTOSCOPIC FIBER OPTIC (SET/KITS/TRAYS/PACK) IMPLANT
TUBE CONNECTING 12X1/4 (SUCTIONS) ×3 IMPLANT
UNDERPAD 30X30 INCONTINENT (UNDERPADS AND DIAPERS) IMPLANT
WATER STERILE IRR 1000ML POUR (IV SOLUTION) ×2 IMPLANT
YANKAUER SUCT BULB TIP NO VENT (SUCTIONS) ×4 IMPLANT

## 2013-07-20 NOTE — Transfer of Care (Signed)
Immediate Anesthesia Transfer of Care Note  Patient: Monica [name]  Procedure(s) Performed: Procedure(s): PARTIAL COLECTOMY (Right) CHOLECYSTECTOMY (N/A)  Patient Location: PACU  Anesthesia Type:General  Level of Consciousness: awake, alert  and oriented  Airway & Oxygen Therapy: Patient Spontanous Breathing and Patient connected to nasal cannula oxygen  Post-op Assessment: Report given to PACU RN and Post -op Vital signs reviewed and stable  Post vital signs: Reviewed and stable  Complications: No apparent anesthesia complications

## 2013-07-20 NOTE — Progress Notes (Signed)
Pt returned from PACU via bed.  VSS. Family at bedside.  Drainage noted on abd dressing.  Patient lethargic, but easy to arouse.  Complaints of pain, and PCA encouraged.  Will cont to monitor.

## 2013-07-20 NOTE — Progress Notes (Signed)
Family Medicine Teaching Service Daily Progress Note Intern Pager: [phone]  Patient name: Monica [name] Medical record number: [id number] Date of birth: 1961/03/01 Age: 52 y.o. Gender: female  Primary Care Provider: Mat Carne, MD Consultants: general surgery, GI Code Status: full  Pt Overview and Major Events to Date: 12/12 - admitted with bowel obstruction due to colon mass and likely acalculous cholecystis 12/15 - continue bowel prep today, plan colonoscopy (12/16), tentative surgery (12/17) 12/16 - colonoscopy (R-colon mass with stricture, polyp --> pending Biopsy) 12/17 - Surgery today (R-colectomy, choley)  Assessment and Plan: Monica [name] is a 52 y.o. female presenting with severe right sided abdominal pain with large bowel obstruction from a mass at the ileocecal junction as well as acalculous cholecystitis. PMH is significant for Type 1 DM, bipolar disorder, hypertension, hepatitis C, history IV drug use, and recent knee surgery.   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 Presumed carcinoma based upon CT and pt without history of prior colon cancer screening. On admission, noted not passing stool or flatus, has improved and currently with loose stools. - Pain Management:     - Dilaudid IV 1mg  q 4 hr PRN mod-severe pain (x 3 doses today) - improved pain control - c/s General Surgery, appreciate all recommendations with management    -  Surgery for colon resection (removal of R-ileocecal mass) and cholecystectomy    - NPO    - pain control, anti-emetics, SCDs and lovenox (hold prior to OR)    - CEA 2.2 (normal range) - c/s GI, appreciate all recommendations with management    - Colonoscopy (R-colon mass, noted malignant stricture unable to pass scope), biopsy  pending  # Acalculous cholecystitis, sludge with extrahepatic biliary ductal dilatation Abdominal US with noted sludge and gall bladder wall thickening. - c/s General Surgery    - plan for cholecystectomy in addition to colectomy    - see management above under plan for R-colon mass  # Type 1 DM History of very brittle diabetes. 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 Held in hospital - noted mild inc b/l LE edema, decide against continuing Lasix for symptom control in setting of pt w/o CHF  FEN/GI: D5-NS @ 100 ml/hr; NPO except meds and ice chips  Prophylaxis: lovenox subcutaneous   Disposition: Admitted to FPTS, inpatient status, pending complete work-up and surgical interventions with recovery and improved clinical course, expect patient to be discharged to home after multiple days.  Subjective: No acute events overnight. She  reports tolerating the colonoscopy yesterday well. Abdominal pain is mostly stable today, does not appear to be worse, improved on Dilaudid 1mg  IV vs previous Morphine. Continues to express significant anxiety regarding surgery, otherwise ready for surgery. Still NPO.  Objective: Temp:  [97.7 F (36.5  C)-98.5 F (36.9 C)] 97.7 F (36.5 C) (12/17 0605) Pulse Rate:  [46-96] 60 (12/17 0605) Resp:  [12-22] 16 (12/17 0605) BP: (104-151)/(69-101) 134/93 mmHg (12/17 0605) SpO2:  [95 %-100 %] 100 % (12/17 4098) Physical Exam: General: middle aged female, anxious, conversational, NAD Cardiovascular: RRR, no murmurs  Respiratory: CTAB Abdomen: soft, with moderate distention (stable), tender to light to moderate palpation RUQ / RLQ, with some generalized tenderness over Left abdomen. +active BS  Extremities: b/l LE ankles with mild +1 non-pitting edema, left knee s/p surgery, steri-strips, no drainage noted  Skin: no rashes  Neuro: alert, oriented, no focal deficits  Laboratory:  Recent Labs Lab 07/16/13 0625 07/17/13 0930 07/20/13 0535  WBC 10.2 4.9 4.1  HGB 13.3 10.9* 10.4*  HCT 38.1 31.3* 31.0*  PLT 195 148* 187    Recent Labs Lab 07/15/13 1518  07/17/13 0930 07/19/13 1215 07/20/13 0535  NA 143  < > 142 139 139  K 3.8  < > 3.0* 4.5 4.2  CL 103  < > 112 109 110  CO2 27  < > 23 20 22   BUN 14  < > 9 3* 3*  CREATININE 1.21*  < > 0.86 0.80 0.86  CALCIUM 9.4  < > 6.8* 8.3* 8.1*  PROT 7.9  --   --   --   --   BILITOT 0.4  --   --   --   --   ALKPHOS 131*  --   --   --   --   ALT 15  --   --   --   --   AST 18  --   --   --   --   GLUCOSE 186*  < > 79 190* 177*  < > = values in this interval not displayed.  CEA 2.2 (normal)  Imaging/Diagnostic Tests: 07/15/13 CT Abdomen IMPRESSION: Changes in the right lower quadrant consistent with a colon carcinoma with circumferential wall thickening and a degree of obstructive change with dilatation of the cecum and distal terminal ileum with significant to the fecal material. Further evaluation by means of colonoscopy is recommended. A mild amount of ascites is noted. Dilatation of the gallbladder with dilation of the common bile duct 1 cm as described. This is of uncertain etiology. Clinical correlation with laboratory values is  recommended.  07/15/13 US Abdomen Complete  IMPRESSION: Gallbladder sludge with positive sonographic Murphy's sign. No calculi are observed. Acalculous cholecystitis not excluded. Extrahepatic biliary ductal dilatation is also present.  07/19/13 Colonoscopy Right colon mass with strictures, colonic polyp. Inability to pass scope through proximal colon malignant stricture. Pending biopsy.  Saralyn Pilar, DO 07/20/2013, 8:22 AM PGY-1, [place] [facility] FPTS Intern pager: [phone], text pages welcome

## 2013-07-20 NOTE — Anesthesia Preprocedure Evaluation (Signed)
Anesthesia Evaluation  Patient identified by MRN, date of birth, ID band Patient awake    Reviewed: Allergy & Precautions, H&P , NPO status , Patient's Chart, lab work & pertinent test results  Airway Mallampati: II  Neck ROM: full    Dental   Pulmonary Current Smoker,          Cardiovascular hypertension,     Neuro/Psych Depression Bipolar Disorder    GI/Hepatic (+) Hepatitis -, C  Endo/Other  diabetes, Type 2  Renal/GU      Musculoskeletal   Abdominal   Peds  Hematology   Anesthesia Other Findings   Reproductive/Obstetrics                           Anesthesia Physical Anesthesia Plan  ASA: II  Anesthesia Plan: General   Post-op Pain Management:    Induction: Intravenous  Airway Management Planned: Oral ETT  Additional Equipment:   Intra-op Plan:   Post-operative Plan: Extubation in OR  Informed Consent: I have reviewed the patients History and Physical, chart, labs and discussed the procedure including the risks, benefits and alternatives for the proposed anesthesia with the patient or authorized representative who has indicated his/her understanding and acceptance.     Plan Discussed with: CRNA, Anesthesiologist and Surgeon  Anesthesia Plan Comments:         Anesthesia Quick Evaluation

## 2013-07-20 NOTE — Op Note (Signed)
07/15/2013 - 07/20/2013  2:31 PM  PATIENT:  Monica [name]  52 y.o. female  PRE-OPERATIVE DIAGNOSIS:  RIGHT COLON MASS, GALLSTONES  POST-OPERATIVE DIAGNOSIS:  RIGHT COLON MASS, GALLSTONES, OMENTAL MASS, ABDOMINAL WALL MASS, LIVER MASS, EVIDENCE OF PELVIC DROP METASTASES  PROCEDURE:  Procedure(s): RIGHT COLECTOMY CHOLECYSTECTOMY LIVER BIOPSY EXCISION ABDOMINAL WALL MASS EXCISION OMENTAL MASS  SURGEON:  Violeta Gelinas, M.D.  PHYSICIAN ASSISTANT:   ASSISTANTS: Jimmye Norman, M.D.   ANESTHESIA:   general  EBL:  Total I/O In: 1000 [I.V.:1000] Out: 400 [Urine:400]  BLOOD ADMINISTERED:none  DRAINS: none   SPECIMEN:  Excision  DISPOSITION OF SPECIMEN:  PATHOLOGY  COUNTS:  YES  DICTATION: .Dragon Dictation  Patient has symptomatically lithiasis and a near obstructing descending colon mass. She is brought for cholecystectomy and right colectomy. She was then preop holding area. She received intravenous antibiotics. Informed consent was obtained. She was brought to the operating room. General endotracheal anesthesia was a Optician, dispensing by the anesthesia staff. Foley was placed by nursing. Abdomen was prepped and draped in a sterile fashion. Time out procedure was performed. Midline incision was made. Subcutaneous tissues were dissected down revealing the anterior fascia. This was divided along the midline. Peritoneal cavity was entered under direct vision. Fascia was opened the length of the incision. There is a small umbilical hernia. This was removed. Abdominal exploration revealed a mass in the abdominal wall along the upper midline, small liver mass consistent with metastasis, omental nodule, and the tumor was well seen in the ascending colon with tattooing in place. Attention was first retracted to the gallbladder. Gallbladder was taken down off the liver bed with Bovie cautery. As we approached the infundibulum was carefully dissected. Cystic artery was dissected and clipped twice  proximally once distally and divided. The cystic duct was identified. The cystic duct common junction was noted. Dissection stayed away from the common bile duct. Cystic duct was encircled. It was clipped twice proximally and divided. Gallbladder was removed the rest away and sent for pathology. At this time liver biopsy was sharply excised and sent for pathology. This was cauterized getting good hemostasis. The next the omental nodule was excised with the LigaSure. This was sent to pathology separately. The abdominal wall nodule Was then removed with cautery. This was sent separately as well. Palpation of the pelvis revealed evidence of drop metastases involving the uterus and left ovary as well as the cul-de-sac. Next the right colon was mobilized from lateral peritoneal attachments. We mobilized the cecum all the way to the hepatic flexure. Duodenum was swept away. Terminal ileum was divided with a GIA-75 stapler. There were palpable nodes in the mesentery. Mesentery was taken down with a combination of LigaSure and several clamps with tying of silk suture. We extended the mesentery resection up to the mid transverse colon. Transverse colon was divided with the GIA-75 stapler as well. The remainder of the mesentery was taken down. Specimen was sent to pathology. We made a side-to-side anastomosis from the distal ileum to transverse colon with the GIA-75 stapler. There was no bleeding. Resultant enterotomy was closed with TA 60. Mesenteric defect was closed with interrupted 2-0 silk sutures. An apex stitch was placed with 2-0 silk suture. The anastomosis widely patent and succus was milked back and forth without leakage. Abdomen was copiously irrigated. There was no bleeding.: Protocol was followed and we redraped. We changed our gown and gloves. Midline fascia was closed with running looped #1 PDS from each end tied in the middle. Subcutaneous  tissues were irrigated and the skin was closed with staples. All  counts were correct. Sterile dressing was applied. Patient tolerated procedure well without apparent complication was taken recovery in stable condition.  PATIENT DISPOSITION:  PACU - hemodynamically stable.   Delay start of Pharmacological VTE agent (>24hrs) due to surgical blood loss or risk of bleeding:  no  Violeta Gelinas, MD, MPH, FACS Pager: [phone]  12/17/20142:31 PM

## 2013-07-20 NOTE — Progress Notes (Signed)
FMTS Attending Note Patient's care discussed with resident team, I agree with Dr Althea Charon' assessment and plan as documented in the note.  Paula Compton, MD

## 2013-07-20 NOTE — Progress Notes (Signed)
Day of Surgery  Subjective: Ready for surgery  Objective: Vital signs in last 24 hours: Temp:  [97.7 F (36.5 C)-98.5 F (36.9 C)] 97.7 F (36.5 C) (12/17 1046) Pulse Rate:  [46-96] 57 (12/17 1046) Resp:  [12-22] 16 (12/17 1046) BP: (104-151)/(69-101) 117/87 mmHg (12/17 1046) SpO2:  [95 %-100 %] 100 % (12/17 1046) Last BM Date: 07/20/13  Intake/Output from previous day: 12/16 0701 - 12/17 0700 In: 1700 [I.V.:1700] Out: -  Intake/Output this shift:    General appearance: alert and cooperative Resp: clear to auscultation bilaterally Cardio: S1, S2 normal GI: soft, minimal upper TTP  Lab Results:   Recent Labs  07/20/13 0535  WBC 4.1  HGB 10.4*  HCT 31.0*  PLT 187   BMET  Recent Labs  07/19/13 1215 07/20/13 0535  NA 139 139  K 4.5 4.2  CL 109 110  CO2 20 22  GLUCOSE 190* 177*  BUN 3* 3*  CREATININE 0.80 0.86  CALCIUM 8.3* 8.1*   PT/INR No results found for this basename: LABPROT, INR,  in the last 72 hours ABG No results found for this basename: PHART, PCO2, PO2, HCO3,  in the last 72 hours  Studies/Results: No results found.  Anti-infectives: Anti-infectives   Start     Dose/Rate Route Frequency Ordered Stop   07/20/13 1200  [MAR Hold]  cefTRIAXone (ROCEPHIN) 2 g in dextrose 5 % 50 mL IVPB     (On MAR Hold since 07/20/13 1058)  Comments:  Send with patient to OR   2 g 100 mL/hr over 30 Minutes Intravenous  Once 07/20/13 1610     07/20/13 1200  [MAR Hold]  metroNIDAZOLE (FLAGYL) IVPB 500 mg     (On MAR Hold since 07/20/13 1058)  Comments:  Send with patient to OR   500 mg 100 mL/hr over 60 Minutes Intravenous  Once 07/20/13 9604        Assessment/Plan: s/p Procedure(s): PARTIAL COLECTOMY (Right) CHOLECYSTECTOMY (N/A) R colon mass and gallstones - for open cholecystectomy and right colectomy - procedure, risks, and benefits D/W patient who agrees.  LOS: 5 days    Melony Tenpas E 07/20/2013

## 2013-07-20 NOTE — Progress Notes (Signed)
Report called to Olegario Messier, RN in Short Stay.  Pt ready to send to OR

## 2013-07-21 ENCOUNTER — Encounter (HOSPITAL_COMMUNITY): Payer: Self-pay | Admitting: General Surgery

## 2013-07-21 DIAGNOSIS — E663 Overweight: Secondary | ICD-10-CM

## 2013-07-21 DIAGNOSIS — R5381 Other malaise: Secondary | ICD-10-CM

## 2013-07-21 DIAGNOSIS — Z6825 Body mass index (BMI) 25.0-25.9, adult: Secondary | ICD-10-CM

## 2013-07-21 LAB — CBC
HCT: 32.6 % — ABNORMAL LOW (ref 36.0–46.0)
MCH: 29 pg (ref 26.0–34.0)
MCHC: 35.3 g/dL (ref 30.0–36.0)
MCV: 82.1 fL (ref 78.0–100.0)
Platelets: 220 10*3/uL (ref 150–400)
RBC: 3.97 MIL/uL (ref 3.87–5.11)
RDW: 13.7 % (ref 11.5–15.5)
WBC: 11.6 10*3/uL — ABNORMAL HIGH (ref 4.0–10.5)

## 2013-07-21 LAB — BASIC METABOLIC PANEL
BUN: 7 mg/dL (ref 6–23)
CO2: 23 mEq/L (ref 19–32)
Calcium: 8.5 mg/dL (ref 8.4–10.5)
Chloride: 106 mEq/L (ref 96–112)
Creatinine, Ser: 0.86 mg/dL (ref 0.50–1.10)
GFR calc non Af Amer: 77 mL/min — ABNORMAL LOW (ref >90)

## 2013-07-21 LAB — GLUCOSE, CAPILLARY
Glucose-Capillary: 315 mg/dL — ABNORMAL HIGH (ref 70–99)
Glucose-Capillary: 258 mg/dL — ABNORMAL HIGH (ref 70–99)
Glucose-Capillary: 214 mg/dL — ABNORMAL HIGH (ref 70–99)
Glucose-Capillary: 187 mg/dL — ABNORMAL HIGH (ref 70–99)

## 2013-07-21 MED ORDER — ENOXAPARIN SODIUM 40 MG/0.4ML ~~LOC~~ SOLN
40.0000 mg | SUBCUTANEOUS | Status: DC
  Administered 2013-07-21 – 2013-07-25 (×5): 40 mg via SUBCUTANEOUS
  Filled 2013-07-21 (×5): qty 0.4

## 2013-07-21 MED ORDER — METHOCARBAMOL 100 MG/ML IJ SOLN
1000.0000 mg | Freq: Three times a day (TID) | INTRAVENOUS | Status: DC
  Administered 2013-07-21 – 2013-07-24 (×9): 1000 mg via INTRAVENOUS
  Filled 2013-07-21 (×14): qty 10

## 2013-07-21 MED ORDER — DIAZEPAM 5 MG PO TABS
10.0000 mg | ORAL_TABLET | Freq: Three times a day (TID) | ORAL | Status: DC | PRN
  Administered 2013-07-21 – 2013-07-25 (×9): 10 mg via ORAL
  Filled 2013-07-21 (×10): qty 2

## 2013-07-21 MED ORDER — DEXTROSE-NACL 5-0.45 % IV SOLN
INTRAVENOUS | Status: DC
  Administered 2013-07-21 – 2013-07-24 (×5): via INTRAVENOUS

## 2013-07-21 MED ORDER — INSULIN GLARGINE 100 UNIT/ML ~~LOC~~ SOLN
15.0000 [IU] | Freq: Every day | SUBCUTANEOUS | Status: DC
  Administered 2013-07-21 – 2013-07-25 (×5): 15 [IU] via SUBCUTANEOUS
  Filled 2013-07-21 (×5): qty 0.15

## 2013-07-21 MED ORDER — SODIUM CHLORIDE 0.9 % IV SOLN
INTRAVENOUS | Status: DC
  Administered 2013-07-21: 10:00:00 via INTRAVENOUS

## 2013-07-21 MED ORDER — PROMETHAZINE HCL 25 MG/ML IJ SOLN
12.5000 mg | Freq: Four times a day (QID) | INTRAMUSCULAR | Status: DC | PRN
  Administered 2013-07-21 – 2013-07-22 (×4): 12.5 mg via INTRAVENOUS
  Filled 2013-07-21 (×5): qty 1

## 2013-07-21 MED ORDER — INSULIN ASPART 100 UNIT/ML ~~LOC~~ SOLN
0.0000 [IU] | Freq: Three times a day (TID) | SUBCUTANEOUS | Status: DC
  Administered 2013-07-21: 8 [IU] via SUBCUTANEOUS
  Administered 2013-07-21: 5 [IU] via SUBCUTANEOUS
  Administered 2013-07-22: 3 [IU] via SUBCUTANEOUS
  Administered 2013-07-22: 15 [IU] via SUBCUTANEOUS

## 2013-07-21 MED ORDER — ACETAMINOPHEN 10 MG/ML IV SOLN
1000.0000 mg | Freq: Four times a day (QID) | INTRAVENOUS | Status: AC
  Administered 2013-07-21 – 2013-07-22 (×4): 1000 mg via INTRAVENOUS
  Filled 2013-07-21 (×6): qty 100

## 2013-07-21 NOTE — Progress Notes (Signed)
I spoke with her about the findings at surgery C/W stage 4 colon CA. Path P. Patient examined and I agree with the assessment and plan  Violeta Gelinas, MD, MPH, FACS Pager: [phone]  07/21/2013 1:43 PM

## 2013-07-21 NOTE — Progress Notes (Signed)
1 Day Post-Op  Subjective: Pt very uncomfortable today, but up to chair.  C/o nausea.  No vomiting.    Objective: Vital signs in last 24 hours: Temp:  [97.1 F (36.2 C)-98.2 F (36.8 C)] 98.2 F (36.8 C) (12/18 1610) Pulse Rate:  [52-82] 77 (12/18 0632) Resp:  [15-33] 24 (12/18 0802) BP: (117-146)/(63-87) 135/63 mmHg (12/18 0632) SpO2:  [94 %-100 %] 99 % (12/18 0802) Last BM Date: 07/20/13  Intake/Output from previous day: 12/17 0701 - 12/18 0700 In: 2457 [I.V.:2457] Out: 2100 [Urine:2100] Intake/Output this shift:    PE: Gen:  Alert, NAD, pleasant Abd: Soft, moderate tenderness, mild distension, diminished BS, no HSM, midline incision with honeycomb dressing in place with sanguinous drainage noted.   Lab Results:   Recent Labs  07/20/13 0535  WBC 4.1  HGB 10.4*  HCT 31.0*  PLT 187   BMET  Recent Labs  07/19/13 1215 07/20/13 0535  NA 139 139  K 4.5 4.2  CL 109 110  CO2 20 22  GLUCOSE 190* 177*  BUN 3* 3*  CREATININE 0.80 0.86  CALCIUM 8.3* 8.1*   PT/INR No results found for this basename: LABPROT, INR,  in the last 72 hours CMP     Component Value Date/Time   NA 139 07/20/2013 0535   K 4.2 07/20/2013 0535   CL 110 07/20/2013 0535   CO2 22 07/20/2013 0535   GLUCOSE 177* 07/20/2013 0535   BUN 3* 07/20/2013 0535   CREATININE 0.86 07/20/2013 0535   CREATININE 1.38* 05/26/2013 1703   CALCIUM 8.1* 07/20/2013 0535   PROT 7.9 07/15/2013 1518   ALBUMIN 3.6 07/15/2013 1518   AST 18 07/15/2013 1518   ALT 15 07/15/2013 1518   ALKPHOS 131* 07/15/2013 1518   BILITOT 0.4 07/15/2013 1518   GFRNONAA 77* 07/20/2013 0535   GFRAA 89* 07/20/2013 0535   Lipase  No results found for this basename: lipase       Studies/Results: No results found.  Anti-infectives: Anti-infectives   Start     Dose/Rate Route Frequency Ordered Stop   07/20/13 1200  [MAR Hold]  cefTRIAXone (ROCEPHIN) 2 g in dextrose 5 % 50 mL IVPB     (On MAR Hold since 07/20/13 1058)   Comments:  Send with patient to OR   2 g 100 mL/hr over 30 Minutes Intravenous  Once 07/20/13 9604 07/20/13 1225   07/20/13 1200  [MAR Hold]  metroNIDAZOLE (FLAGYL) IVPB 500 mg     (On MAR Hold since 07/20/13 1058)  Comments:  Send with patient to OR   500 mg 100 mL/hr over 60 Minutes Intravenous  Once 07/20/13 5409 07/20/13 1230       Assessment/Plan Right colonic mass Adenocarcinoma per biopsy site, pending surgical pathology POD #1 s/p Right colectomy, cholecystectomy, liver biopsy, excision of abdominal wall mass, excision of omental mass Evidence of metastasis to omentum, abdominal wall, liver, and pelvis (Stage 4 cancer) Cholelithiasis & extrahepatic biliary ductal dilitation  Post-operative ileus  Bipolar disorder unspecified  DM unspecified - Hgb A1c 6.9  Depression  Hep C  Elevated creatinine - normal yesterday  Hypokalemia - nl yesterday  Plan:  1. Expected to have a post-operative ileus, NPO/bowel rest until bowel function returns 2. IVF, pain control - add IV tylenol and robaxin, antiemetics 3. SCD's and lovenox 4. Ambulate and IS  5. POD #8 left knee for a fatty tumor, okay to take off dressing and leave open to air, leave steri-strips in place until they fall  off 6.  Dr. Janee Morn to discuss results of surgery today 7.  Will need oncology involved and possibly palliative care consult at some point 8.  PT ordered to help with mobilization/deconditioning    LOS: 6 days    DORT, Jarquis Walker 07/21/2013, 8:30 AM Pager: [phone]

## 2013-07-21 NOTE — Progress Notes (Signed)
Patients care discussed with resident team and I agree with assessment and plan as per Dr Yetta Numbers note.  Paula Compton MD

## 2013-07-21 NOTE — Progress Notes (Signed)
Family Medicine Teaching Service Daily Progress Note Intern Pager: [phone]  Patient name: Monica [name] Medical record number: [id number] Date of birth: [birth year] Age: 52 y.o. Gender: female  Primary Care Provider: Mat Carne, MD Consultants: general surgery, GI Code Status: full  Pt Overview and Major Events to Date: 12/12 - admitted with bowel obstruction due to colon mass and likely acalculous cholecystis 12/15 - continue bowel prep today, plan colonoscopy (12/16), tentative surgery (12/17) 12/16 - colonoscopy (R-colon mass with stricture, polyp --> pending Biopsy) 12/17 - Surgery right colectomy and cholecystectomy, mets to liver, omentum, abdominal wall found  Assessment and Plan: Monica [name] is a 52 y.o. female presenting with severe right sided abdominal pain with large bowel obstruction from a mass at the ileocecal junction as well as acalculous cholecystitis. PMH is significant for Type 1 DM, bipolar disorder, hypertension, hepatitis C, history IV drug use, and recent knee surgery.   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 P - Surgery demonstrated numerous mets, which is very sad for the patient, Dr. Janee Morn will discuss results of the surgery with the patient and we will be happy to collaborate in involved Heme-Onc - Pain Management: Dilaudid PCA    - NPO    - pain control, anti-emetics, SCDs and lovenox (hold prior to OR)    - CEA 2.2 (normal range) - c/s GI, appreciate all recommendations with management    - Colonoscopy (R-colon mass, noted malignant stricture unable to pass scope), biopsy pending  # Acalculous cholecystitis, sludge with extrahepatic biliary ductal dilatation Abdominal US with noted sludge and gall bladder wall  thickening. - S/p cholecystectomy 12/17  # Type 1 DM History of very brittle diabetes. - CBGs q 4 hr (currently NPO), CBG trend --> 150-200s stable - Increase Lantus to 15 units daily with moderate SSI   # Left knee surgery Patient is s/p resection of fatty tumor from anterior left knee, no evidence of infection or post-operative complication  - Keep dressing in place with steri-strips, reportedly able to remove strips  # Bipolar Disorder/Anxiety  - Continue home geodon, clonazepam, atarax, and ramelteon  - change lamictal to 200mg  in AM vs 100mg  BID - Add back home diazepam  # Nicotine Abuse - smokes 1 ppd  - Nicotine patch to 21mg   # HLD  -  home atorvastatin (hold if desired while NPO)  # Hypokalemia K+ 3.0, replaced with K+ with IVF - improved K 3.5-->4.5-->4.2  # h/o Chronic LE Edema Home regimen of Lasix PO 40mg  BID for LE swelling. Held in hospital - noted mild inc b/l LE edema, decide against continuing Lasix for symptom control in setting of pt w/o CHF  FEN/GI: NS @ 100 ml/hr; NPO except meds and ice chips  Prophylaxis: lovenox subcutaneous   Disposition: Admitted to FPTS, inpatient status, pending complete work-up and surgical interventions with recovery and improved clinical course, expect patient to be discharged to home after multiple days.  Subjective: Major surgery yesteday, pt complaining of severe pain and nausea, no flatus  Objective: Temp:  [97.1 F (36.2 C)-98.2 F (36.8 C)] 98.2 F (36.8 C) (12/18 1478) Pulse Rate:  [52-82] 77 (12/18 0632) Resp:  [15-33] 24 (12/18 0802) BP: (117-146)/(63-87) 135/63 mmHg (12/18 0632) SpO2:  [94 %-100 %] 99 % (12/18 0802) Physical Exam: General: middle aged female,  pale, anxious, conversational, NAD Cardiovascular: RRR, no murmurs  Respiratory: CTAB Abdomen: soft, with moderate distention (stable), covered midline incision Extremities: b/l LE ankles with mild +1 non-pitting edema, left knee s/p surgery,  steri-strips, no drainage noted  Skin: no rashes  Neuro: alert, oriented, no focal deficits  Laboratory:  Recent Labs Lab 07/16/13 0625 07/17/13 0930 07/20/13 0535  WBC 10.2 4.9 4.1  HGB 13.3 10.9* 10.4*  HCT 38.1 31.3* 31.0*  PLT 195 148* 187    Recent Labs Lab 07/15/13 1518  07/17/13 0930 07/19/13 1215 07/20/13 0535  NA 143  < > 142 139 139  K 3.8  < > 3.0* 4.5 4.2  CL 103  < > 112 109 110  CO2 27  < > 23 20 22   BUN 14  < > 9 3* 3*  CREATININE 1.21*  < > 0.86 0.80 0.86  CALCIUM 9.4  < > 6.8* 8.3* 8.1*  PROT 7.9  --   --   --   --   BILITOT 0.4  --   --   --   --   ALKPHOS 131*  --   --   --   --   ALT 15  --   --   --   --   AST 18  --   --   --   --   GLUCOSE 186*  < > 79 190* 177*  < > = values in this interval not displayed.  CEA 2.2 (normal)  Imaging/Diagnostic Tests: 07/15/13 CT Abdomen IMPRESSION: Changes in the right lower quadrant consistent with a colon carcinoma with circumferential wall thickening and a degree of obstructive change with dilatation of the cecum and distal terminal ileum with significant to the fecal material. Further evaluation by means of colonoscopy is recommended. A mild amount of ascites is noted. Dilatation of the gallbladder with dilation of the common bile duct 1 cm as described. This is of uncertain etiology. Clinical correlation with laboratory values is recommended.  07/15/13 US Abdomen Complete  IMPRESSION: Gallbladder sludge with positive sonographic Murphy's sign. No calculi are observed. Acalculous cholecystitis not excluded. Extrahepatic biliary ductal dilatation is also present.  07/19/13 Colonoscopy Right colon mass with strictures, colonic polyp. Inability to pass scope through proximal colon malignant stricture. Pending biopsy.  Garnetta Buddy, MD 07/21/2013, 9:32 AM PGY-1, [facility] FPTS Intern pager: [phone], text pages welcome

## 2013-07-21 NOTE — Anesthesia Postprocedure Evaluation (Signed)
Anesthesia Post Note  Patient: Monica [name]  Procedure(s) Performed: Procedure(s) (LRB): PARTIAL COLECTOMY (Right) CHOLECYSTECTOMY (N/A)  Anesthesia type: General  Patient location: PACU  Post pain: Pain level controlled and Adequate analgesia  Post assessment: Post-op Vital signs reviewed, Patient's Cardiovascular Status Stable, Respiratory Function Stable, Patent Airway and Pain level controlled  Last Vitals:  Filed Vitals:   07/21/13 0948  BP: 140/66  Pulse: 72  Temp: 36.9 C  Resp: 22    Post vital signs: Reviewed and stable  Level of consciousness: awake, alert  and oriented  Complications: No apparent anesthesia complications

## 2013-07-21 NOTE — Progress Notes (Signed)
Family Medicine Teaching Service Daily Progress Note Intern Pager: [phone]  Patient name: Monica [name] Medical record number: [id number] Date of birth: 1961-02-18 Age: 52 y.o. Gender: female  Primary Care Provider: Mat Carne, MD Consultants: general surgery, GI Code Status: full  Pt Overview and Major Events to Date: 12/12 - admitted with bowel obstruction due to colon mass and likely acalculous cholecystis 12/15 - continue bowel prep today, plan colonoscopy (12/16), tentative surgery (12/17) 12/16 - colonoscopy (R-colon mass with stricture, polyp --> pending Biopsy) 12/17 - Surgery right colectomy and cholecystectomy, mets to liver, omentum, abdominal wall found 12/19 - PT --> rec HH, c/s Oncology (Dr. Bertis Ruddy plans to see today)  Assessment and Plan: Monica [name] is a 52 y.o. female presenting with severe right sided abdominal pain with large bowel obstruction from a mass at the ileocecal junction as well as acalculous cholecystitis. PMH is significant for Type 1 DM, bipolar disorder, hypertension, hepatitis C, history IV drug use, and recent knee surgery.   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 GI performed colonoscopy (identified R-colon mass, malignant stricture, biopsied confirmed adenocarcinoma). CEA 2.2 (normal range) - c/s General Surgery, greatly appreciate all recommendations   - POD #2 s/p (07/20/13) R-colectomy, Cholecystectomy, Liver bx, Excision of multiple masses (abdominal wall, omental)   - surgical findings consistent with Stage 4 Colon CA, multiple suspected mets to liver, abdominal cavity   - Results discussed with patient   - remain NPO for bowel rest, post-operative ileus, continue to monitor f/u flatus / BM - Pain Management:  Transitioned off of Dilaudid PCA --> Dilaudid IV 0.5-1mg  q 2 hr PRN    - NPO    - pain control, anti-emetics, SCDs and lovenox    - CEA 2.2 (normal range) - c/s Oncology (Dr. Bertis Ruddy) via [facility], appreciate all recommendations   - plans to see pt today to discuss future management, expect that pt will require surgical port placement pending potential plans for chemotherapy. Follow-up recommendations.  # Acalculous cholecystitis, sludge with extrahepatic biliary ductal dilatation Abdominal US with noted sludge and gall bladder wall thickening. - S/p cholecystectomy 12/17  # Type 1 DM History of very brittle diabetes. 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 Held in hospital - noted mild inc b/l LE edema, decide against continuing Lasix for symptom control in setting of pt w/o CHF  FEN/GI: NS @ 100 ml/hr; NPO except meds and ice chips  Prophylaxis: lovenox, SCDs  Disposition: Admitted to FPTS, inpatient status, pending complete work-up and surgical  interventions with recovery and improved clinical course, expect patient to be discharged to home with [facility] after multiple day post-op recovery. Expect tentative discharge Monday / Tuesday next week.  Follow-up arrangements to be made with Oncology  Subjective: No acute overnight events. Patient overall tolerated recent surgery well, continues significant abdominal pain transitioned off of Dilaudid PCA to Dilaudid IV PRN. No flatus or BM yet. Pleasant and understands diagnosis, no further concerns at this time. Expecting to discuss further with Oncologist.  Objective: Temp:  [97.5 F (36.4 C)-98.8 F (37.1 C)] 98.1 F (36.7 C) (12/18 2100) Pulse Rate:  [63-77] 63 (12/18 2100) Resp:  [20-32] 20 (12/18 2100) BP: (133-142)/(63-73) 133/73 mmHg (12/18 2100) SpO2:  [95 %-100 %] 100 % (12/18 2100) Physical Exam: General: middle aged female, pleasant, NAD Cardiovascular: RRR, no murmurs  Respiratory: CTAB Abdomen: soft, moderate tenderness, non-distended, covered midline incision with dressing in place, +hypoactive BS Extremities: b/l LE ankles with mild +1 non-pitting edema, left knee s/p surgery, steri-strips, no drainage noted  Skin: no rashes  Neuro: alert, oriented, no focal deficits  Laboratory:  Recent Labs Lab 07/17/13 0930 07/20/13 0535 07/21/13 0945  WBC 4.9 4.1 11.6*  HGB 10.9* 10.4* 11.5*  HCT 31.3* 31.0* 32.6*  PLT 148* 187 220    Recent Labs Lab 07/15/13 1518  07/19/13 1215 07/20/13 0535 07/21/13 0945  NA 143  < > 139 139 138  K 3.8  < > 4.5 4.2 4.0  CL 103  < > 109 110 106  CO2 27  < > 20 22 23   BUN 14  < > 3* 3* 7  CREATININE 1.21*  < > 0.80 0.86 0.86  CALCIUM 9.4  < > 8.3* 8.1* 8.5  PROT 7.9  --   --   --   --   BILITOT 0.4  --   --   --   --   ALKPHOS 131*  --   --   --   --   ALT 15  --   --   --   --   AST 18  --   --   --   --   GLUCOSE 186*  < > 190* 177* 285*  < > = values in this interval not displayed.  CEA 2.2 (normal)  Imaging/Diagnostic Tests: 07/15/13 CT Abdomen IMPRESSION: Changes in the right lower quadrant consistent with a colon carcinoma with circumferential wall thickening and a degree of obstructive change with dilatation of  the cecum and distal terminal ileum with significant to the fecal material. Further evaluation by means of colonoscopy is recommended. A mild amount of ascites is noted. Dilatation of the gallbladder with dilation of the common bile duct 1 cm as described. This is of uncertain etiology. Clinical correlation with laboratory values is recommended.  07/15/13 US Abdomen Complete  IMPRESSION: Gallbladder sludge with positive sonographic Murphy's sign. No calculi are observed. Acalculous cholecystitis not excluded. Extrahepatic biliary ductal dilatation is also present.  07/19/13 Colonoscopy Right colon mass with strictures, colonic polyp. Inability to pass scope through proximal colon malignant stricture. 1. Colon, biopsy, Ascending - ADENOCARCINOMA. 2. Colon, polyp(s), Transverse - SESSILE SERRATED ADENOMA. NO HIGH GRADE DYSPLASIA OR MALIGNANCY IDENTIFIED  07/20/13 Surgery: Open R-colectomy, Cholecystectomy, Liver Biopsy, Excision Masses (Abd wall, Omental) -pending pathology report  Saralyn Pilar, DO 07/21/2013, 11:12 PM PGY-1, [facility] FPTS Intern pager: [phone], text pages welcome

## 2013-07-21 NOTE — Evaluation (Signed)
Physical Therapy Evaluation Patient Details Name: Monica [name] MRN: [id number] DOB: 07-Jun-1961 Today's Date: 07/21/2013 Time: 2725-3664 PT Time Calculation (min): 25 min  PT Assessment / Plan / Recommendation History of Present Illness  patient is a 52 yo female with RIGHT COLON MASS, GALLSTONES, OMENTAL MASS, ABDOMINAL WALL MASS, LIVER MASS, EVIDENCE OF PELVIC DROP METASTASES.  Clinical Impression  Patient with severe pain, unable to tolerate much activity. Very limited ambulation across the room with max encouragement and significant time. Required assist for mobility and transfers. Pt very reluctant to participate in mobility and did not wish for continued therapy, explained to patient need for safety and mobility. Will continue to see and progress activity as tolerated. Rec HHPT and assist upon discharge.    PT Assessment  Patient needs continued PT services    Follow Up Recommendations  Home health PT;Supervision/Assistance - 24 hour    Does the patient have the potential to tolerate intense rehabilitation      Barriers to Discharge Decreased caregiver support will be alone for long periods of time    Equipment Recommendations  Rolling walker with 5" wheels;3in1 (PT)    Recommendations for Other Services OT consult   Frequency Min 4X/week    Precautions / Restrictions Restrictions Weight Bearing Restrictions: No   Pertinent Vitals/Pain 10/10      Mobility  Bed Mobility Bed Mobility: Sit to Supine Sit to Supine: 3: Mod assist Transfers Transfers: Sit to Stand;Stand to Sit Sit to Stand: 4: Min assist Stand to Sit: 4: Min assist Ambulation/Gait Ambulation/Gait Assistance: 4: Min Environmental consultant (Feet): 24 Feet Assistive device: Rolling walker Ambulation/Gait Assistance Details: VCs for upright posture and proper use of RW to not abandon rw during mobility, very slow gait, significant amount of time to move minimal distance secondary to pain Gait  Pattern: Step-to pattern;Decreased stride length;Trunk flexed Gait velocity: significantly decreased General Gait Details: limited by severe pain        PT Diagnosis: Difficulty walking;Acute pain  PT Problem List: Decreased strength;Decreased activity tolerance;Decreased mobility;Decreased knowledge of use of DME PT Treatment Interventions: DME instruction;Gait training;Stair training;Functional mobility training;Therapeutic activities;Therapeutic exercise;Balance training;Patient/family education     PT Goals(Current goals can be found in the care plan section) Acute Rehab PT Goals Patient Stated Goal: to not be in pain PT Goal Formulation: With patient Time For Goal Achievement: 08/04/13 Potential to Achieve Goals: Good  Visit Information  Last PT Received On: 07/21/13 Assistance Needed: +1 (+2 chair follow line management) History of Present Illness: patient is a 52 yo female with RIGHT COLON MASS, GALLSTONES, OMENTAL MASS, ABDOMINAL WALL MASS, LIVER MASS, EVIDENCE OF PELVIC DROP METASTASES.       Prior Functioning  Home Living Family/patient expects to be discharged to:: Private residence Living Arrangements: Alone Available Help at Discharge: Available PRN/intermittently Type of Home: House Home Access: Stairs to enter Secretary/administrator of Steps: 3 Entrance Stairs-Rails: Right Home Layout: Two level Home Equipment: None Prior Function Level of Independence: Independent    Cognition  Cognition Arousal/Alertness: Awake/alert Behavior During Therapy: Flat affect Overall Cognitive Status: No family/caregiver present to determine baseline cognitive functioning          End of Session PT - End of Session Equipment Utilized During Treatment: Gait belt Activity Tolerance: Patient limited by pain Patient left: in bed;with call bell/phone within reach Nurse Communication: Mobility status  GP     Fabio Asa 07/21/2013, 4:28 PM Charlotte Crumb, PT DPT   [phone]

## 2013-07-22 ENCOUNTER — Encounter (HOSPITAL_COMMUNITY): Payer: Self-pay | Admitting: Hematology and Oncology

## 2013-07-22 ENCOUNTER — Encounter: Payer: Self-pay | Admitting: Hematology and Oncology

## 2013-07-22 ENCOUNTER — Other Ambulatory Visit: Payer: Self-pay | Admitting: Hematology and Oncology

## 2013-07-22 DIAGNOSIS — C786 Secondary malignant neoplasm of retroperitoneum and peritoneum: Secondary | ICD-10-CM

## 2013-07-22 DIAGNOSIS — F17200 Nicotine dependence, unspecified, uncomplicated: Secondary | ICD-10-CM

## 2013-07-22 DIAGNOSIS — C182 Malignant neoplasm of ascending colon: Principal | ICD-10-CM

## 2013-07-22 DIAGNOSIS — C189 Malignant neoplasm of colon, unspecified: Secondary | ICD-10-CM

## 2013-07-22 DIAGNOSIS — D649 Anemia, unspecified: Secondary | ICD-10-CM

## 2013-07-22 LAB — BASIC METABOLIC PANEL
BUN: 7 mg/dL (ref 6–23)
Chloride: 105 mEq/L (ref 96–112)
Creatinine, Ser: 0.86 mg/dL (ref 0.50–1.10)
GFR calc Af Amer: 89 mL/min — ABNORMAL LOW (ref >90)
GFR calc non Af Amer: 77 mL/min — ABNORMAL LOW (ref >90)
Glucose, Bld: 201 mg/dL — ABNORMAL HIGH (ref 70–99)

## 2013-07-22 LAB — GLUCOSE, CAPILLARY
Glucose-Capillary: 363 mg/dL — ABNORMAL HIGH (ref 70–99)
Glucose-Capillary: 118 mg/dL — ABNORMAL HIGH (ref 70–99)

## 2013-07-22 MED ORDER — ACETAMINOPHEN 10 MG/ML IV SOLN
1000.0000 mg | Freq: Four times a day (QID) | INTRAVENOUS | Status: AC
  Administered 2013-07-22 – 2013-07-23 (×4): 1000 mg via INTRAVENOUS
  Filled 2013-07-22 (×4): qty 100

## 2013-07-22 MED ORDER — INSULIN ASPART 100 UNIT/ML ~~LOC~~ SOLN
0.0000 [IU] | SUBCUTANEOUS | Status: DC
  Administered 2013-07-22: 2 [IU] via SUBCUTANEOUS
  Administered 2013-07-23 (×2): 3 [IU] via SUBCUTANEOUS
  Administered 2013-07-24: 2 [IU] via SUBCUTANEOUS
  Administered 2013-07-24: 3 [IU] via SUBCUTANEOUS
  Administered 2013-07-24: 5 [IU] via SUBCUTANEOUS
  Administered 2013-07-25 (×2): 2 [IU] via SUBCUTANEOUS

## 2013-07-22 MED ORDER — HYDROMORPHONE HCL PF 1 MG/ML IJ SOLN
0.5000 mg | INTRAMUSCULAR | Status: DC | PRN
  Administered 2013-07-22 – 2013-07-24 (×11): 1 mg via INTRAVENOUS
  Filled 2013-07-22 (×11): qty 1

## 2013-07-22 NOTE — Progress Notes (Signed)
Orthopedic Tech Progress Note Patient Details:  Monica [name] Jan 04, 1961 [id number]  Ortho Devices Type of Ortho Device: Abdominal binder Ortho Device/Splint Interventions: Casandra Doffing 07/22/2013, 5:30 PM

## 2013-07-22 NOTE — Progress Notes (Signed)
Inpatient Diabetes Program Recommendations  AACE/ADA: New Consensus Statement on Inpatient Glycemic Control (2013)  Target Ranges:  Prepandial:   less than 140 mg/dL      Peak postprandial:   less than 180 mg/dL (1-2 hours)      Critically ill patients:  140 - 180 mg/dL   Results for KIM, OKI (MRN [id number]) as of 07/22/2013 11:43  Ref. Range 07/21/2013 07:38 07/21/2013 12:22 07/21/2013 16:03 07/21/2013 21:36 07/22/2013 07:58  Glucose-Capillary Latest Range: 70-99 mg/dL 782 (H) 956 (H) 213 (H) 187 (H) 363 (H)   Inpatient Diabetes Program Recommendations Insulin - Basal: Note increase of Lantus from 15 units to 18 units daily.   Correction (SSI): Please change frequency of CBGs and Novolog correction to Q4H.  Note: Patient has a history of diabetes Type 1 and according to the home medication list, she takes Lantus 20 units daily and Humalog 2-8 units correction daily as an outpatient for diabetes management.  Currently, patient is ordered to receive Lantus 18 units QD (ordered today) and Novolog 0-15 units AC for inpatient glycemic control.  Blood glucose ranged from 187-363 mg/dl over the past 24 hours and fasting blood glucose this morning was 363 mg/dl.  Also, noted that patient is NPO.  Please consider changing frequency of CBGs and Novolog correction to Q4H.   In reviewing the chart, note that patient is followed by [facility].  According to notes from Dr. Clinton Sawyer on 06/27/13, "Pt last A1C 6.5, but very labile control. I am have been concerned about her inability to correct for sugars that are too high or too low. She has refused referrals to diabetes education, nutrition, and pharmacy. Today she notes her sugars have been 30-400s."  It is noted on several office visits that patient's blood sugar fluctuates from very low to 400's and patient has presented to the clinic with severe lows which required treatment.  Inpatient glycemic control will need to be followed closely  while inpatient.  Thanks, Orlando Penner, RN, MSN, CCRN Diabetes Coordinator Inpatient Diabetes Program [phone] (Team Pager) [phone] ([facility] office) [phone] [facility] office)

## 2013-07-22 NOTE — Progress Notes (Signed)
Patient's care discussed with resident team, I agree with Dr Althea Charon' assessment and plan as per his note.  Paula Compton, MD

## 2013-07-22 NOTE — Progress Notes (Signed)
Physical Therapy Treatment Patient Details Name: Monica [name] MRN: [id number] DOB: 1960-10-07 Today's Date: 07/22/2013 Time: 4098-1191 PT Time Calculation (min): 24 min  PT Assessment / Plan / Recommendation  History of Present Illness patient is a 52 yo female with RIGHT COLON MASS, GALLSTONES, OMENTAL MASS, ABDOMINAL WALL MASS, LIVER MASS, EVIDENCE OF PELVIC DROP METASTASES.   PT Comments   Patient with improved mobility/gait today.  Pain continues to be limiting factor.  Follow Up Recommendations  Home health PT;Supervision/Assistance - 24 hour     Does the patient have the potential to tolerate intense rehabilitation     Barriers to Discharge        Equipment Recommendations  Rolling walker with 5" wheels;3in1 (PT)    Recommendations for Other Services OT consult  Frequency Min 4X/week   Progress towards PT Goals Progress towards PT goals: Progressing toward goals  Plan Current plan remains appropriate    Precautions / Restrictions Precautions Precautions: None Restrictions Weight Bearing Restrictions: No   Pertinent Vitals/Pain Pain 10/10 with ambulation    Mobility  Bed Mobility Bed Mobility: Sit to Supine Sit to Supine: 4: Min assist Details for Bed Mobility Assistance: Min assist to bring LE's onto bed. Transfers Transfers: Sit to Stand;Stand to Sit Sit to Stand: 4: Min guard;With upper extremity assist;With armrests;From chair/3-in-1 Stand to Sit: 4: Min guard;With upper extremity assist;To bed Details for Transfer Assistance: Verbal cues for hand placement.  Assist for balance/safety only. Ambulation/Gait Ambulation/Gait Assistance: 4: Min guard Ambulation Distance (Feet): 150 Feet Assistive device: Rolling walker Ambulation/Gait Assistance Details: Verbal cues for safe use of RW.  Cues to remain upright and look forward during gait.  Difficulty maneuvering RW during turns - able to complete with increased time.  Patient with slow, guarded gait. Gait  Pattern: Step-through pattern;Decreased stride length;Trunk flexed Gait velocity: significantly decreased General Gait Details: limited by severe pain      PT Goals (current goals can now be found in the care plan section)    Visit Information  Last PT Received On: 07/22/13 Assistance Needed: +1 History of Present Illness: patient is a 52 yo female with RIGHT COLON MASS, GALLSTONES, OMENTAL MASS, ABDOMINAL WALL MASS, LIVER MASS, EVIDENCE OF PELVIC DROP METASTASES.    Subjective Data  Subjective: My stomach hurts when I move.   Cognition  Cognition Arousal/Alertness: Awake/alert Behavior During Therapy: Flat affect Overall Cognitive Status: Within Functional Limits for tasks assessed    Balance     End of Session PT - End of Session Equipment Utilized During Treatment: Gait belt Activity Tolerance: Patient limited by pain;Patient limited by fatigue Patient left: in bed;with call bell/phone within reach Nurse Communication: Mobility status;Patient requests pain meds   GP     Vena Austria 07/22/2013, 3:25 PM Durenda Hurt. Renaldo Fiddler, [facility] Acute Rehab Services Pager [phone]

## 2013-07-22 NOTE — Consult Note (Signed)
[facility] CONSULT NOTE  Patient Care Team: Garnetta Buddy, MD as PCP - General (Family Medicine) Dyke Maes, MD as Consulting Physician (Nephrology) Knox Saliva Jamelle Haring, MD as Consulting Physician (Psychiatry)  CHIEF COMPLAINTS/PURPOSE OF CONSULTATION:  Newly diagnosed colon cancer with omental metastasis  HISTORY OF PRESENTING ILLNESS:  Johny Shears 52 y.o. female is here because of newly diagnosed colon cancer. The patient never had colonoscopy. She denies any change in bowel habits or rectal bleeding. Prior to the diagnosis, she was complaining of severe abdominal pain and was admitted to the hospital for evaluation. On 07/15/2013, CT scan of the abdomen was performed without contrast. Significant bowel dilatation was noted to have a suspicious for malignancy.ultrasound of her abdomen showed no evidence of liver lesion. On 07/19/2013, colonoscopy was performed and she was found to have ascending colon lesion with significant stricture and biopsy confirmed adenocarcinoma. Baseline CEA was negative. On 07/20/2013, she had right colectomy, cholecystectomy, liver biopsy, excision of abdomen wall mass and omental mass. Final pathology from her colon resection show large ascending colon cancer extending into the pericolonic tissue. Omental mass was positive for metastatic cancer. Biopsy from the liver was negative for metastatic disease.  She is currently postoperative day 2. The patient is quite sedated with pain medication and unable to give much history. She felt that her pain is currently under control. MEDICAL HISTORY:  Past Medical History  Diagnosis Date  . Diabetes mellitus   . Bipolar disorder   . Depression   . Hepatitis C   . Psychotic episode   . CORNEAL ABRASION, RIGHT 08/20/2010    Qualifier: Diagnosis of  By: Daphine Deutscher FNP, Zena Amos      SURGICAL HISTORY: Past Surgical History  Procedure Laterality Date  . Cesarean section    .  Colonoscopy N/A 07/19/2013    Procedure: COLONOSCOPY;  Surgeon: Theda Belfast, MD;  Location: [facility] ENDOSCOPY;  Service: Endoscopy;  Laterality: N/A;  . Partial colectomy Right 07/20/2013    Procedure: PARTIAL COLECTOMY;  Surgeon: Liz Malady, MD;  Location: [facility] OR;  Service: General;  Laterality: Right;  . Cholecystectomy N/A 07/20/2013    Procedure: CHOLECYSTECTOMY;  Surgeon: Liz Malady, MD;  Location: [facility] OR;  Service: General;  Laterality: N/A;    SOCIAL HISTORY: History   Social History  . Marital Status: Divorced    Spouse Name: N/A    Number of Children: N/A  . Years of Education: N/A   Occupational History  . Not on file.   Social History Main Topics  . Smoking status: Current Every Day Smoker -- 1.00 packs/day for 20 years    Types: Cigarettes  . Smokeless tobacco: Never Used  . Alcohol Use: No  . 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 Sexual Activity: No   Other Topics Concern  . Not on file   Social History Narrative  . No narrative on file    FAMILY HISTORY: History reviewed. No pertinent family history.  ALLERGIES:  has No Known Allergies.  MEDICATIONS:  Current Facility-Administered Medications  Medication Dose Route Frequency Provider Last Rate Last Dose  . acetaminophen (OFIRMEV) IV 1,000 mg  1,000 mg Intravenous Q6H Megan Dort, PA-C   1,000 mg at 07/22/13 1141  . atorvastatin (LIPITOR) tablet 40 mg  40 mg Oral q1800 Leighton Roach McDiarmid, MD   40 mg at 07/20/13 1904  . clonazePAM (KLONOPIN) tablet 0.5  mg  0.5 mg Oral TID PRN Leighton Roach McDiarmid, MD   0.5 mg at 07/22/13 1139  . dextrose 5 %-0.45 % sodium chloride infusion   Intravenous Continuous Anselm Lis, MD 75 mL/hr at 07/22/13 0300    . diazepam (VALIUM) tablet 10 mg  10 mg Oral TID PRN Garnetta Buddy, MD   10 mg at 07/22/13 1457  . enoxaparin (LOVENOX) injection 40 mg  40 mg Subcutaneous Q24H Megan Dort, PA-C   40 mg at 07/22/13 1109  .  gabapentin (NEURONTIN) capsule 800 mg  800 mg Oral BID Leighton Roach McDiarmid, MD   800 mg at 07/22/13 1108  . HYDROmorphone (DILAUDID) injection 0.5-1 mg  0.5-1 mg Intravenous Q2H PRN Megan Dort, PA-C   1 mg at 07/22/13 1457  . hydrOXYzine (ATARAX/VISTARIL) tablet 100 mg  100 mg Oral TID PRN Garnetta Buddy, MD   100 mg at 07/22/13 1146  . insulin aspart (novoLOG) injection 0-15 Units  0-15 Units Subcutaneous Q4H Beverely Low, MD      . insulin glargine (LANTUS) injection 15 Units  15 Units Subcutaneous Daily Garnetta Buddy, MD   15 Units at 07/22/13 1109  . lamoTRIgine (LAMICTAL) tablet 200 mg  200 mg Oral Daily Saralyn Pilar, DO   200 mg at 07/22/13 1110  . methocarbamol (ROBAXIN) 1,000 mg in dextrose 5 % 50 mL IVPB  1,000 mg Intravenous Q8H Megan Dort, PA-C   1,000 mg at 07/22/13 1545  . nicotine (NICODERM CQ - dosed in mg/24 hours) patch 21 mg  21 mg Transdermal Daily Beverely Low, MD   21 mg at 07/22/13 1100  . ondansetron (ZOFRAN) tablet 4 mg  4 mg Oral Q6H PRN Garnetta Buddy, MD       Or  . ondansetron [facility]) injection 4 mg  4 mg Intravenous Q6H PRN Garnetta Buddy, MD   4 mg at 07/21/13 2124  . pantoprazole (PROTONIX) EC tablet 40 mg  40 mg Oral Daily Garnetta Buddy, MD   40 mg at 07/22/13 1140  . promethazine (PHENERGAN) injection 12.5 mg  12.5 mg Intravenous Q6H PRN Almond Lint, MD   12.5 mg at 07/22/13 0754  . ramelteon (ROZEREM) tablet 8 mg  8 mg Oral QHS Garnetta Buddy, MD   8 mg at 07/21/13 2331  . sodium chloride 0.9 % injection 10-40 mL  10-40 mL Intracatheter Q12H Leighton Roach McDiarmid, MD   10 mL at 07/22/13 1140  . sodium chloride 0.9 % injection 10-40 mL  10-40 mL Intracatheter PRN Leighton Roach McDiarmid, MD   20 mL at 07/21/13 0946  . ziprasidone (GEODON) capsule 160 mg  160 mg Oral QHS Leighton Roach McDiarmid, MD   160 mg at 07/21/13 2122    REVIEW OF SYSTEMS:  Review of system is deemed unreliable due to the patient's state of sedation All other systems were  reviewed with the patient and are negative.  PHYSICAL EXAMINATION: ECOG PERFORMANCE STATUS: 1 - Symptomatic but completely ambulatory  Filed Vitals:   07/22/13 1410  BP: 105/64  Pulse: 59  Temp: 97.7 F (36.5 C)  Resp: 20   Filed Weights   07/15/13 1956  Weight: 163 lb (73.936 kg)    GENERAL:alert, no distress and comfortable SKIN: skin color, texture, turgor are normal, no rashes or significant lesions EYES: normal, conjunctiva are pink and non-injected, sclera clear OROPHARYNX:no exudate, no erythema and lips, buccal mucosa, and tongue normal  NECK: supple, thyroid normal size, non-tender, without nodularity  LYMPH:  no palpable lymphadenopathy in the cervical, axillary or inguinal LUNGS: clear to auscultation and percussion with normal breathing effort HEART: regular rate & rhythm and no murmurs and no lower extremity edema ABDOMEN:abdomen soft, non-tender and normal bowel sounds. well-healed surgical scar with mild distention Musculoskeletal:no cyanosis of digits and no clubbing  PSYCH: alert & oriented x 3 with fluent speech NEURO: no focal motor/sensory deficits  LABORATORY DATA:  I have reviewed the data as listed Lab Results  Component Value Date   WBC 11.6* 07/21/2013   HGB 11.5* 07/21/2013   HCT 32.6* 07/21/2013   MCV 82.1 07/21/2013   PLT 220 07/21/2013   RADIOGRAPHIC STUDIES: I have personally reviewed the radiological images as listed and agreed with the findings in the report. Ct Abdomen Pelvis Wo Contrast  07/15/2013   CLINICAL DATA:  Lower abdominal pain  EXAM: CT ABDOMEN AND PELVIS WITHOUT CONTRAST  TECHNIQUE: Multidetector CT imaging of the abdomen and pelvis was performed following the standard protocol without intravenous contrast.  COMPARISON:  None.  FINDINGS: The lung bases are free of acute infiltrate or sizable effusion.  The liver, spleen, adrenal glands and pancreas are all normal in their CT appearance. The gallbladder is distended and there is  prominence of the common bile duct to 1 cm. No definitive obstructive calculus is seen. Correlation with laboratory values is recommended. The kidneys are well visualized without renal calculi or urinary tract obstructive changes.  In the right lower quadrant there is circumferential colonic wall thickening suspicious for a colonic carcinoma. Proximal to this, the cecum is dilated with significant amount of fecal material as well as dilatation with fecal material of the distal terminal ileum. Fecal material is noted within the more distal colon  Bladder is well distended. No pelvic mass lesion is seen. A minimal amount of ascites is noted. No definitive peritoneal implantation is noted. The osseous structures show no acute abnormality.  IMPRESSION: Changes in the right lower quadrant consistent with a colon carcinoma with circumferential wall thickening and a degree of obstructive change with dilatation of the cecum and distal terminal ileum with significant to the fecal material. Further evaluation by means of colonoscopy is recommended. A mild amount of ascites is noted.  Dilatation of the gallbladder with dilation of the common bile duct 1 cm as described. This is of uncertain etiology. Clinical correlation with laboratory values is recommended.   Electronically Signed   By: Alcide Clever M.D.   On: 07/15/2013 19:53   ASSESSMENT:  Metastatic colon cancer to the omentum status post resection  PLAN:  #1 metastatic colon cancer T4, N2, M1 Discuss with the patient briefly that she would need adjuvant chemotherapy. It is unclear to me whether her liver is involved or not. According to the surgeon, the appearance of her liver was abnormal. This patient had background history of hepatitis C and biopsy only showed fibrosis. I will order blood work to recheck her hepatic function tomorrow. I recommend PET/CT scan staging to be done as an outpatient. She will need Infuse-a-Port placement but we can do that as an  outpatient. #2 mild anemia This is likely anemia of from blood loss from recent surgery.. The patient denies recent history of bleeding such as epistaxis, hematuria or hematochezia. She is asymptomatic from the anemia. We will observe for now.  She does not require transfusion now.  #3 history of bipolar disorder According to the patient, her mental status under control. She denies any severe depression or suicidal  ideation.  I will bring her back to my clinic in 2 weeks for further discussion. The patient will need minimum 4-5 weeks of healing time before we start her on systemic treatment. I will order a PET CT scan to be done as an outpatient. I will also get pathology department to add KRAS testing.  All questions were answered. The patient knows to call the clinic with any problems, questions or concerns.    Taziyah Iannuzzi, MD @T @ 4:40 PM

## 2013-07-22 NOTE — Progress Notes (Signed)
Up in chair. Doing better today. Going to walk in the hall. Patient examined and I agree with the assessment and plan  Violeta Gelinas, MD, MPH, FACS Pager: [phone]  07/22/2013 2:50 PM

## 2013-07-22 NOTE — Progress Notes (Signed)
2 Days Post-Op  Subjective: Pt more alert today, requesting to get rid of PCA.  Sad and talking about her cancer and chemotherapy.  Mobilized some yesterday with PT.  No flatus or BM yet, but heard stomach "rumbling".  NPO.  Objective: Vital signs in last 24 hours: Temp:  [98.1 F (36.7 C)-98.8 F (37.1 C)] 98.4 F (36.9 C) (12/19 0510) Pulse Rate:  [48-74] 64 (12/19 0545) Resp:  [16-28] 24 (12/19 0758) BP: (126-142)/(64-76) 126/76 mmHg (12/19 0510) SpO2:  [93 %-100 %] 93 % (12/19 0758) Last BM Date: 07/20/13  Intake/Output from previous day: 12/18 0701 - 12/19 0700 In: 2563 [I.V.:1983; IV Piggyback:580] Out: 800 [Urine:800] Intake/Output this shift:    PE: Gen:  Alert, NAD, pleasant Abd: Soft, moderate tenderness, ND, +BS, no HSM, incisions with honeycomb dressing in place with sanguinous drainage   Lab Results:   Recent Labs  07/20/13 0535 07/21/13 0945  WBC 4.1 11.6*  HGB 10.4* 11.5*  HCT 31.0* 32.6*  PLT 187 220   BMET  Recent Labs  07/20/13 0535 07/21/13 0945  NA 139 138  K 4.2 4.0  CL 110 106  CO2 22 23  GLUCOSE 177* 285*  BUN 3* 7  CREATININE 0.86 0.86  CALCIUM 8.1* 8.5   PT/INR No results found for this basename: LABPROT, INR,  in the last 72 hours CMP     Component Value Date/Time   NA 138 07/21/2013 0945   K 4.0 07/21/2013 0945   CL 106 07/21/2013 0945   CO2 23 07/21/2013 0945   GLUCOSE 285* 07/21/2013 0945   BUN 7 07/21/2013 0945   CREATININE 0.86 07/21/2013 0945   CREATININE 1.38* 05/26/2013 1703   CALCIUM 8.5 07/21/2013 0945   PROT 7.9 07/15/2013 1518   ALBUMIN 3.6 07/15/2013 1518   AST 18 07/15/2013 1518   ALT 15 07/15/2013 1518   ALKPHOS 131* 07/15/2013 1518   BILITOT 0.4 07/15/2013 1518   GFRNONAA 77* 07/21/2013 0945   GFRAA 89* 07/21/2013 0945   Lipase  No results found for this basename: lipase       Studies/Results: No results found.  Anti-infectives: Anti-infectives   Start     Dose/Rate Route Frequency  Ordered Stop   07/20/13 1200  [MAR Hold]  cefTRIAXone (ROCEPHIN) 2 g in dextrose 5 % 50 mL IVPB     (On MAR Hold since 07/20/13 1058)  Comments:  Send with patient to OR   2 g 100 mL/hr over 30 Minutes Intravenous  Once 07/20/13 1324 07/20/13 1225   07/20/13 1200  [MAR Hold]  metroNIDAZOLE (FLAGYL) IVPB 500 mg     (On MAR Hold since 07/20/13 1058)  Comments:  Send with patient to OR   500 mg 100 mL/hr over 60 Minutes Intravenous  Once 07/20/13 4010 07/20/13 1230       Assessment/Plan Right colonic mass Adenocarcinoma per biopsy site, pending surgical pathology  POD #2 s/p Right colectomy, cholecystectomy, liver biopsy, excision of abdominal wall mass, excision of omental mass  Evidence of metastasis to omentum, abdominal wall, liver, and pelvis (Stage 4 cancer)  Cholelithiasis & extrahepatic biliary ductal dilitation  Post-operative ileus  Bipolar disorder unspecified  DM unspecified - Hgb A1c 6.9  Depression  Hep C  Leukocytosis - likely reactive from surgery   Plan: 1. Expected to have a post-operative ileus, bowel function starting to return.  Would like nausea to be improved, and flatus before advancing diet.  Monitor BMET. 2. IVF, pain control - d/c PCA and  start PRN IV meds, cont robaxin & antiemetics  3. SCD's and lovenox  4. Ambulate and IS  5. POD #9 left knee for a fatty tumor, okay to take off dressing and leave open to air, leave steri-strips in place until they fall off  6. Dr. Janee Morn discussed diagnosis with the patient yesterday and she asked some very appropriate questions regarding chemotherapy. 7. Will need oncology involved and possibly palliative care consult at some point  8. PT ordered to help with mobilization/deconditioning 9. Remove honeycomb dressing tomorrow morning and change to dry dressing 10. D/c foley   LOS: 7 days    DORT, Aundra Millet 07/22/2013, 9:17 AM Pager: [phone]

## 2013-07-23 DIAGNOSIS — C189 Malignant neoplasm of colon, unspecified: Secondary | ICD-10-CM

## 2013-07-23 DIAGNOSIS — E876 Hypokalemia: Secondary | ICD-10-CM

## 2013-07-23 LAB — BASIC METABOLIC PANEL
BUN: 5 mg/dL — ABNORMAL LOW (ref 6–23)
Creatinine, Ser: 0.92 mg/dL (ref 0.50–1.10)
GFR calc Af Amer: 82 mL/min — ABNORMAL LOW (ref >90)
GFR calc non Af Amer: 71 mL/min — ABNORMAL LOW (ref >90)
Potassium: 3.2 mEq/L — ABNORMAL LOW (ref 3.5–5.1)
Sodium: 141 mEq/L (ref 135–145)

## 2013-07-23 LAB — HEPATIC FUNCTION PANEL
AST: 17 U/L (ref 0–37)
Bilirubin, Direct: <0.1 mg/dL (ref 0.0–0.3)

## 2013-07-23 LAB — GLUCOSE, CAPILLARY: Glucose-Capillary: 163 mg/dL — ABNORMAL HIGH (ref 70–99)

## 2013-07-23 MED ORDER — OXYCODONE-ACETAMINOPHEN 5-325 MG PO TABS
1.0000 | ORAL_TABLET | ORAL | Status: DC | PRN
  Administered 2013-07-23: 2 via ORAL
  Filled 2013-07-23: qty 2

## 2013-07-23 MED ORDER — POTASSIUM CHLORIDE CRYS ER 10 MEQ PO TBCR
10.0000 meq | EXTENDED_RELEASE_TABLET | Freq: Three times a day (TID) | ORAL | Status: AC
  Administered 2013-07-23 (×4): 10 meq via ORAL
  Filled 2013-07-23 (×3): qty 1

## 2013-07-23 NOTE — Progress Notes (Signed)
FMTS Attending Note PAtient seen and examined by me, discussed with resident team and I agree with Dr Valorie Roosevelt note for today. Paula Compton, MD

## 2013-07-23 NOTE — Progress Notes (Signed)
3 Days Post-Op  Subjective: Very anxious and apprehensive.  Did well with sips, complains of pain a great deal.  She got very anxious when I said we would try to cut back on IV pain medicines.  Very anxious looking at open wound for the first time.  Objective: Vital signs in last 24 hours: Temp:  [97.3 F (36.3 C)-99 F (37.2 C)] 99 F (37.2 C) (12/20 0610) Pulse Rate:  [50-67] 58 (12/20 0610) Resp:  [16-24] 16 (12/20 0610) BP: (91-107)/(54-67) 100/60 mmHg (12/20 0610) SpO2:  [93 %-97 %] 93 % (12/20 0610) Last BM Date: 07/22/13 360 PO, 2 BM's recorded Tm 99, VSS  SBP around 100 K+ is low, otherwise normal CMP Diet: Sips Intake/Output from previous day: 12/19 0701 - 12/20 0700 In: 2136.3 [P.O.:360; I.V.:1776.3] Out: 2900 [Urine:2900] Intake/Output this shift:    General appearance: alert, cooperative, no distress and very anxious Resp: clear to auscultation bilaterally GI: soft, no distension, wound looks good, +BS, +BM yesterday.  Lab Results:   Recent Labs  07/21/13 0945  WBC 11.6*  HGB 11.5*  HCT 32.6*  PLT 220    BMET  Recent Labs  07/22/13 1110 07/23/13 0500  NA 140 141  K 3.9 3.2*  CL 105 110  CO2 25 27  GLUCOSE 201* 107*  BUN 7 5*  CREATININE 0.86 0.92  CALCIUM 8.2* 7.9*   PT/INR No results found for this basename: LABPROT, INR,  in the last 72 hours   Recent Labs Lab 07/23/13 0500  AST 17  ALT 13  ALKPHOS 77  BILITOT 0.1*  PROT 4.7*  ALBUMIN 1.9*     Lipase  No results found for this basename: lipase     Studies/Results: No results found.  Medications: . atorvastatin  40 mg Oral q1800  . enoxaparin (LOVENOX) injection  40 mg Subcutaneous Q24H  . gabapentin  800 mg Oral BID  . insulin aspart  0-15 Units Subcutaneous Q4H  . insulin glargine  15 Units Subcutaneous Daily  . lamoTRIgine  200 mg Oral Daily  . methocarbamol (ROBAXIN) IV  1,000 mg Intravenous Q8H  . nicotine  21 mg Transdermal Daily  . pantoprazole  40 mg Oral  Daily  . ramelteon  8 mg Oral QHS  . sodium chloride  10-40 mL Intracatheter Q12H  . ziprasidone  160 mg Oral QHS   . dextrose 5 % and 0.45% NaCl 75 mL/hr at 07/23/13 0541   Prior to Admission medications   Medication Sig Start Date End Date Taking? Authorizing Provider  ACCU-CHEK AVIVA PLUS test strip USE AS DIRECTED 07/08/13  Yes Garnetta Buddy, MD  atorvastatin (LIPITOR) 40 MG tablet Take 1 tablet (40 mg total) by mouth daily. 05/26/13  Yes Garnetta Buddy, MD  clonazePAM (KLONOPIN) 1 MG tablet Take 1 mg by mouth 3 (three) times daily as needed for anxiety.    Yes Historical Provider, MD  furosemide (LASIX) 40 MG tablet Take 1 tablet (40 mg total) by mouth 2 (two) times daily. 09/28/12  Yes Garnetta Buddy, MD  gabapentin (NEURONTIN) 800 MG tablet Take 800 mg by mouth 2 (two) times daily. 12/16/12  Yes Garnetta Buddy, MD  hydrOXYzine (VISTARIL) 100 MG capsule Take 100 mg by mouth 3 (three) times daily as needed for anxiety.    Yes Historical Provider, MD  ibuprofen (ADVIL,MOTRIN) 800 MG tablet Take 1 tablet (800 mg total) by mouth 3 (three) times daily. 12/29/12  Yes Roxy Horseman, PA-C  insulin glargine (LANTUS)  100 UNIT/ML injection Inject 20 Units into the skin daily.   Yes Historical Provider, MD  insulin lispro (HUMALOG) 100 UNIT/ML injection Inject 2-8 Units into the skin daily. PT USES SLIDING SCALE 06/10/12  Yes Garnetta Buddy, MD  lamoTRIgine (LAMICTAL) 100 MG tablet Take 100 mg by mouth 2 (two) times daily.   Yes Historical Provider, MD  omeprazole (PRILOSEC) 20 MG capsule Take 20 mg by mouth daily.   Yes Historical Provider, MD  oxyCODONE-acetaminophen (PERCOCET/ROXICET) 5-325 MG per tablet Take 1 tablet by mouth every 4 (four) hours as needed for severe pain.   Yes Historical Provider, MD  polyethylene glycol (MIRALAX / GLYCOLAX) packet Take 17 g by mouth daily as needed.   Yes Historical Provider, MD  valACYclovir (VALTREX) 500 MG tablet Take 500 mg by mouth  daily.   Yes Historical Provider, MD  ziprasidone (GEODON) 80 MG capsule Take 80 mg by mouth at bedtime.    Yes Historical Provider, MD     Diagnosis 1. Gallbladder - CHRONIC CHOLECYSTITIS. - BENIGN REACTIVE LYMPH NODE. 2. Colon, segmental resection for tumor, Right, Ascending - COLONIC ADENOCARCINOMA EXTENDING INTO PERICOLONIC CONNECTIVE TISSUE AND FOCALLY TO SEROSAL SURFACE. 3. Omentum, biopsy, Omental Implants - METASTATIC ADENOCARCINOMA. 4. Liver, biopsy, Mass - FIBROTIC NODULE. - NO MALIGNANCY IDENTIFIED. 5. Soft tissue mass, simple excision, Abdominal wall mass - METASTATIC ADENOCARCINOMA Assessment/Plan 1.  RIGHT COLON Cancer [id number], GALLSTONES, OMENTAL MASS, ABDOMINAL WALL MASS, LIVER MASS, EVIDENCE OF PELVIC DROP METASTASES S/p RIGHT COLECTOMY, ChoLECYSTECTOMY, LIVER BIOPSY, EXCISION ABDOMINAL WALL MASS, EXCISION OMENTAL MASS, Liz Malady, MD 07/20/2013.   2.  Post-operative ileus  3.  Bipolar disorder/anxiety  4.  DM unspecified - Hgb A1c 6.9  5.  Depression  6.  Hep C  7.  Leukocytosis  8.  Tobacco use  PLan:  Clear liquids, she is already on her Anxiety/Bipolar medications, she needs to be doing IS, and she is Doing PT. LOVENOX for DVT. KCL for hypokalemia today, increase activity recheck labs in AM.       LOS: 8 days    Makih Stefanko 07/23/2013

## 2013-07-23 NOTE — Progress Notes (Signed)
Family Medicine Teaching Service Daily Progress Note Intern Pager: [phone]  Patient name: Monica [name] Medical record number: [id number] Date of birth: [birth year] Age: 52 y.o. Gender: female  Primary Care Provider: Mat Carne, MD Consultants: general surgery, GI, oncology (Dr. Bertis [name]) Code Status: full  Pt Overview and Major Events to Date: 12/12 - admitted with bowel obstruction due to colon mass and likely acalculous cholecystis 12/15 - continue bowel prep today, plan colonoscopy (12/16), tentative surgery (12/17) 12/16 - colonoscopy (R-colon mass with stricture, polyp --> pending Biopsy) 12/17 - Surgery right colectomy and cholecystectomy, mets to liver, omentum, abdominal wall found 12/19 - PT --> rec HH, c/s Oncology (Dr. Bertis [name])  Assessment and Plan: Monica [name] is a 52 y.o. female presenting with severe right sided abdominal pain with large bowel obstruction from a mass at the ileocecal junction as well as acalculous cholecystitis, with new diagnosis of metastatic colonic adenocarcinoma. PMH is significant for Type 1 DM, bipolar disorder, hypertension, hepatitis C, history IV drug use, and recent knee surgery.   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 GI performed colonoscopy (identified R-colon mass, malignant stricture, biopsied confirmed adenocarcinoma). CEA 2.2 (normal range) - General Surgery following, greatly appreciate all recommendations   - POD #3 s/p (07/20/13) R-colectomy, Cholecystectomy, Liver bx, Excision of multiple masses (abdominal wall, omental)   - surgical findings consistent with Stage 4 Colon CA, multiple suspected mets to liver, abdominal cavity   - Results discussed with patient   - clears per surgery, may transition to  full liquids tonight for dinner if does well with lunch - Pain Management: Transitioned off of Dilaudid PCA --> Dilaudid IV 0.5-1mg  q 2 hr PRN    - NPO    - pain control, anti-emetics, SCDs and lovenox    - CEA 2.2 (normal range) - Seen by Oncology (Dr. Bertis [name]) - planning for outpatient PET/CT scan, outpt placement of port, f/u in oncology clinic in 2 weeks. Needs 4-5 weeks healing before starting chemotherapy. Onc also to add on KRAS testing to pathology. Greatly appreciate oncology recommendations.  # Acalculous cholecystitis, sludge with extrahepatic biliary ductal dilatation Abdominal US with noted sludge and gall bladder wall thickening. - S/p cholecystectomy 12/17, path suggestive of chronic cholecystitis  # Type 1 DM History of very brittle diabetes. - CBGs q 4 hr (currently NPO), CBG's well controlled currently - Novolog moderate SSI (8u in past 24 hrs) - Lantus on 15u daily  # Left knee surgery Patient is s/p resection of fatty tumor from anterior left knee, no evidence of infection or post-operative complication  - Keep dressing in place with steri-strips, reportedly able to remove strips  # Bipolar Disorder/Anxiety  - Continue home geodon, clonazepam, atarax, and ramelteon  - lamictal to 200mg  in AM - continue home Diazepam 10mg  TID PRN  # Nicotine Abuse - smokes 1 ppd  - Nicotine patch to 21mg   # HLD  - home atorvastatin 40mg  daily  # Hypokalemia -repleted today by surgery team  # h/o Chronic LE Edema Home regimen of Lasix PO 40mg  BID for LE swelling. Held in hospital - noted mild inc b/l LE edema, decide against continuing Lasix for symptom control in setting of pt w/o CHF  FEN/GI: NS @ 100 ml/hr; clear liquid diet  Prophylaxis: lovenox, SCDs  Disposition: Admitted to FPTS, inpatient status,  pending complete work-up and surgical interventions with recovery and improved clinical course, expect patient to be discharged to home with [facility] after multiple day post-op  recovery. Expect tentative discharge Monday / Tuesday next week. Follow-up arrangements to be made with Oncology.  Subjective: C/o uncontrolled pain. Switching to clears for lunch then possibly full liquids for dinner. Met with oncologist yesterday and will f/u as outpatient with them.  Objective: Temp:  [97.3 F (36.3 C)-99 F (37.2 C)] 99 F (37.2 C) (12/20 0610) Pulse Rate:  [50-67] 58 (12/20 0610) Resp:  [16-20] 16 (12/20 0610) BP: (91-107)/(54-67) 100/60 mmHg (12/20 0610) SpO2:  [93 %-97 %] 93 % (12/20 0610) Physical Exam: General: middle aged female, pleasant, NAD Cardiovascular: RRR, no murmurs  Respiratory: CTAB Abdomen: soft, moderate appropriate tenderness, non-distended, incision clean/dry/intact with staples present, +hypoactive BS Extremities: calves NTTP, left knee s/p surgery, steri-strips Skin: no rashes  Neuro: alert, oriented, no focal deficits  Laboratory:  Recent Labs Lab 07/17/13 0930 07/20/13 0535 07/21/13 0945  WBC 4.9 4.1 11.6*  HGB 10.9* 10.4* 11.5*  HCT 31.3* 31.0* 32.6*  PLT 148* 187 220    Recent Labs Lab 07/21/13 0945 07/22/13 1110 07/23/13 0500  NA 138 140 141  K 4.0 3.9 3.2*  CL 106 105 110  CO2 23 25 27   BUN 7 7 5*  CREATININE 0.86 0.86 0.92  CALCIUM 8.5 8.2* 7.9*  PROT  --   --  4.7*  BILITOT  --   --  0.1*  ALKPHOS  --   --  77  ALT  --   --  13  AST  --   --  17  GLUCOSE 285* 201* 107*    CEA 2.2 (normal)  Imaging/Diagnostic Tests: 07/15/13 CT Abdomen IMPRESSION: Changes in the right lower quadrant consistent with a colon carcinoma with circumferential wall thickening and a degree of obstructive change with dilatation of the cecum and distal terminal ileum with significant to the fecal material. Further evaluation by means of colonoscopy is recommended. A mild amount of ascites is noted. Dilatation of the gallbladder with dilation of the common bile duct 1 cm as described. This is of uncertain etiology. Clinical  correlation with laboratory values is recommended.  07/15/13 US Abdomen Complete  IMPRESSION: Gallbladder sludge with positive sonographic Murphy's sign. No calculi are observed. Acalculous cholecystitis not excluded. Extrahepatic biliary ductal dilatation is also present.  07/19/13 Colonoscopy Right colon mass with strictures, colonic polyp. Inability to pass scope through proximal colon malignant stricture. 1. Colon, biopsy, Ascending - ADENOCARCINOMA. 2. Colon, polyp(s), Transverse - SESSILE SERRATED ADENOMA. NO HIGH GRADE DYSPLASIA OR MALIGNANCY IDENTIFIED  07/20/13 Surgery: Open R-colectomy, Cholecystectomy, Liver Biopsy, Excision Masses (Abd wall, Omental) Path: chronic cholecystitis, colonic adenocarcinoma metastatic to omentum & abdominal wall, liver bx is fibrotic nodules without evidence of malignancy  Latrelle Dodrill, MD 07/23/2013, 10:57 AM PGY-2, [place] [facility] FPTS Intern pager: [phone], text pages welcome

## 2013-07-23 NOTE — Progress Notes (Signed)
Family Medicine Teaching Service Daily Progress Note Intern Pager: [phone]  Patient name: Monica [name] Medical record number: [id number] Date of birth: 1960/09/14 Age: 52 y.o. Gender: female  Primary Care Provider: Mat Carne, MD Consultants: general surgery, GI, oncology (Dr. Bertis Ruddy) Code Status: full  Pt Overview and Major Events to Date: 12/12 - admitted with bowel obstruction due to colon mass and likely acalculous cholecystis 12/15 - continue bowel prep today, plan colonoscopy (12/16), tentative surgery (12/17) 12/16 - colonoscopy (R-colon mass with stricture, polyp --> pending Biopsy) 12/17 - Surgery right colectomy and cholecystectomy, mets to liver, omentum, abdominal wall found 12/19 - PT --> rec HH, c/s Oncology (Dr. Bertis Ruddy) 12/21 - POD #4, advanced diet to full, PO pain regimen, +BM  Assessment and Plan: Monica [name] is a 52 y.o. female presenting with severe right sided abdominal pain with large bowel obstruction from a mass at the ileocecal junction as well as acalculous cholecystitis, with new diagnosis of metastatic colonic adenocarcinoma. PMH is significant for Type 1 DM, bipolar disorder, hypertension, hepatitis C, history IV drug use, and recent knee surgery.   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 GI performed colonoscopy (identified R-colon mass, malignant stricture, biopsied confirmed adenocarcinoma). CEA 2.2 (normal range) - General Surgery following, greatly appreciate all recommendations   - POD #4 s/p (07/20/13) R-colectomy, Cholecystectomy, Liver bx, Excision of multiple masses (abdominal wall, omental)   - surgical findings consistent with Stage 4 Colon CA, multiple suspected mets to liver, abdominal cavity   - Results  discussed with patient   - start full liquids today - Pain Management: inc Oxy IR to 10-20mg  q 4 hr PRN / Dilaudid IV 0.5-1mg  q 4 hr PRN brkthrough    - NPO    - pain control, anti-emetics, SCDs and lovenox    - CEA 2.2 (normal range) - Seen by Oncology (Dr. Bertis Ruddy) - planning for outpatient PET/CT scan, outpt placement of port, f/u in oncology clinic in 2 weeks. Needs 4-5 weeks healing before starting chemotherapy. Onc also to add on KRAS testing to pathology. Greatly appreciate oncology recommendations.  # Acalculous cholecystitis, sludge with extrahepatic biliary ductal dilatation Abdominal US with noted sludge and gall bladder wall thickening. - S/p cholecystectomy 12/17, path suggestive of chronic cholecystitis  # Type 1 DM History of very brittle diabetes. - CBGs q 4 hr (currently NPO), CBG's well controlled currently - Novolog moderate SSI - Lantus on 15u daily  # Left knee surgery Patient is s/p resection of fatty tumor from anterior left knee, no evidence of infection or post-operative complication  - Keep dressing in place with steri-strips, reportedly able to remove strips  # Bipolar Disorder/Anxiety  - Continue home geodon, clonazepam, atarax, and ramelteon  - lamictal to 200mg  in AM - continue home Diazepam 10mg  TID PRN  # Nicotine Abuse - smokes 1 ppd  - Nicotine patch to 21mg   # HLD  - home atorvastatin 40mg  daily  # Hypokalemia - Resolved - K 3.7  # h/o Chronic LE Edema Home regimen of Lasix PO 40mg  BID for LE swelling. Held in hospital - noted mild inc b/l LE edema, decide against continuing Lasix for symptom control in setting of pt w/o CHF  FEN/GI: D5-1/2NS @ 50 ml/hr; full liquid diet  Prophylaxis: lovenox  Disposition: Admitted to FPTS, inpatient status, pending  complete work-up and surgical interventions with recovery and improved clinical course, expect patient to be discharged to [facility] with [facility] after multiple day post-op recovery. Expect tentative  discharge Monday / Tuesday next week. Follow-up arrangements to be made with Oncology.  Subjective: No acute events overnight. Complains of uncontrolled abdominal pain, minimal improvement with PO pain medication. Noted loose stools few x daily. Ambulating in room some more. Attempting full liquid diet for breakfast.   Objective: Temp:  [97.9 F (36.6 C)-98.3 F (36.8 C)] 97.9 F (36.6 C) (12/21 0449) Pulse Rate:  [53-58] 53 (12/21 0449) Resp:  [16] 16 (12/21 0449) BP: (108-119)/(60-66) 108/60 mmHg (12/21 0449) SpO2:  [97 %-98 %] 97 % (12/21 0449) Physical Exam: General: standing up at bedside cautiously moving around room, anxious but cooperative, NAD Cardiovascular: RRR, no murmurs  Respiratory: CTAB Abdomen: soft, moderate appropriate tenderness, non-distended, incision c/d/i with staples present (covered by larger dressing), +active BS Extremities: non-tender LE, left knee s/p surgery, steri-strips Skin: no rashes  Neuro: alert, oriented, no focal deficits  Laboratory:  Recent Labs Lab 07/20/13 0535 07/21/13 0945 07/24/13 0500  WBC 4.1 11.6* 5.9  HGB 10.4* 11.5* 10.5*  HCT 31.0* 32.6* 31.2*  PLT 187 220 211    Recent Labs Lab 07/22/13 1110 07/23/13 0500 07/24/13 0500  NA 140 141 143  K 3.9 3.2* 3.7  CL 105 110 112  CO2 25 27 24   BUN 7 5* 5*  CREATININE 0.86 0.92 0.95  CALCIUM 8.2* 7.9* 7.6*  PROT  --  4.7*  --   BILITOT  --  0.1*  --   ALKPHOS  --  77  --   ALT  --  13  --   AST  --  17  --   GLUCOSE 201* 107* 150*    CEA 2.2 (normal)  Imaging/Diagnostic Tests: 07/15/13 CT Abdomen IMPRESSION: Changes in the right lower quadrant consistent with a colon carcinoma with circumferential wall thickening and a degree of obstructive change with dilatation of the cecum and distal terminal ileum with significant to the fecal material. Further evaluation by means of colonoscopy is recommended. A mild amount of ascites is noted. Dilatation of the gallbladder with  dilation of the common bile duct 1 cm as described. This is of uncertain etiology. Clinical correlation with laboratory values is recommended.  07/15/13 US Abdomen Complete  IMPRESSION: Gallbladder sludge with positive sonographic Murphy's sign. No calculi are observed. Acalculous cholecystitis not excluded. Extrahepatic biliary ductal dilatation is also present.  07/19/13 Colonoscopy Right colon mass with strictures, colonic polyp. Inability to pass scope through proximal colon malignant stricture. 1. Colon, biopsy, Ascending - ADENOCARCINOMA. 2. Colon, polyp(s), Transverse - SESSILE SERRATED ADENOMA. NO HIGH GRADE DYSPLASIA OR MALIGNANCY IDENTIFIED  07/20/13 Surgery: Open R-colectomy, Cholecystectomy, Liver Biopsy, Excision Masses (Abd wall, Omental) Path: chronic cholecystitis, colonic adenocarcinoma metastatic to omentum & abdominal wall, liver bx is fibrotic nodules without evidence of malignancy  Saralyn Pilar, DO 07/24/2013, 12:18 PM PGY-1,  Family Medicine FPTS Intern pager: [phone], text pages welcome

## 2013-07-23 NOTE — Discharge Summary (Signed)
Family Medicine Teaching [facility] Discharge Summary  Patient name: Monica [name] Medical record number: [id number] Date of birth: 02/10/1961 Age: 52 y.o. Gender: female Date of Admission: 07/15/2013  Date of Discharge: 07/25/13 Admitting Physician: Leighton Roach McDiarmid, MD  Primary Care Provider: Mat Carne, MD Consultants: General Surgery, GI, Oncology  Indication for Hospitalization: Abdominal Pain, Bowel Obstruction due to mass  Discharge Diagnoses/Problem List:  Adenocarcinoma, Right-sided [id number] with abdominal mets - s/p partial R-colectomy and excisional biopsy mets Acalculous cholecystitis, s/p cholecystectomy Type 1 DM, moderately controlled Bipolar Disorder with Anxiety H/o Left knee surgery (resection of fatty tumor) H/o Opioid and polysubstance abuse Tobacco abuse HLD H/o Chronic LE Edema Hypokalemia - Resolved  Disposition: Home  Discharge Condition: Stable  Brief Hospital Course: Monica [name] is a 52 y.o. female presenting with severe right sided abdominal pain with large bowel obstruction from a mass at the ileocecal junction as well as acalculous cholecystitis, with new diagnosis of metastatic colonic adenocarcinoma. PMH is significant for Type 1 DM, bipolar disorder, hypertension, hepatitis C, history IV drug use, and recent knee surgery.  # Adenocarcinoma, R-sided [id number] with abdominal mets - s/p partial R-colectomy and excisional biopsy mets  Admitted with severe R-sided abdominal pain, initial work-up with CT showed R-sided ileocecal jxn mass, labs unremarkable (normal CMET, LFTs, CBC, normal WBC). Note no prior history of colonoscopy. GI performed colonoscopy (identified R-colon mass, malignant stricture, biopsied confirmed adenocarcinoma). CEA 2.2 (normal range). General Surgery closely managed patient and completed R-partial colectomy with additional excision of newly discovered multiple abdominal metastatic masses (abd wall and omentum),  results on biopsy confirmed Stage 4 Colon CA. Consulted Oncology, discussed future treatment plan for patient and agreed to proceed with outpatient work-up / treatment including PET/CT scan, placement of port for chemotherapy. Remainder of hospitalization involved post-op recovery, due to patient's history of opioid dependence, required significant amounts of pain medication with questionable control, diet advanced gradually, and surgical site healed well. Prior to discharge, patient was tolerating low fiber diet, having BMs, improved pain control, discharged on Oxycodone 10-20mg  q 4 hr PRN.  # Acalculous cholecystitis, s/p cholecystectomy Abdominal US with noted sludge and gall bladder wall thickening. S/p cholecystectomy 12/17, path suggestive of chronic cholecystitis.  # Type 1 DM, moderately controlled  History of very brittle diabetes. Frequent CBG monitoring due to NPO for surgeries. Controlled on Lantus 15u daily, Novolog moderate SSI.  # Bipolar Disorder with Anxiety  Continued home geodon, clonazepam, atarax, ramelteon, lamictal, diazepam.  # H/o Left knee surgery (resection of fatty tumor) Patient is s/p resection of fatty tumor from anterior left knee, no evidence of infection or post-operative complication. Surgery monitored incision site without any further concerns.  # H/o Opioid and polysubstance abuse Significant history of opioid and substance abuse. Noted to be on suboxone at home. Complicated post-op pain management. Discharged with Oxycodone 10-20mg  q 4 PRN (#90, 0 refills).  # Tobacco Abuse Current smoker 1ppd, placed on Nicotine patch 21mg .  # HLD Continued home atorvastatin 40mg  daily  # h/o Chronic LE Edema  Home regimen of Lasix PO 40mg  BID for LE swelling. Held in hospital as mostly for symptomatic control in patient without h/o of CHF. Minimal edema observed on exam  # Hypokalemia - Resolved   Issues for Follow Up:   1. Oncology Work-up / Treatment - Seen by  Dr. Bertis Ruddy in hospital, planning for outpatient PET/CT scan, outpt placement of port, f/u in oncology clinic in 2 weeks. Needs 4-5 weeks healing before  starting chemotherapy. Onc also to add on KRAS testing to pathology.  2. Post-op Pain Management - given rx Oxycodone 10-20mg  q 4 hr PRN (#90, 0 refills) on 07/25/13.  Significant Procedures: 07/19/13 - Colonoscopy with biopsies 07/20/13 - Right partial colectomy, colonic mass removal, excisional removal multiple abdominal mets  Significant Labs and Imaging:   Recent Labs Lab 07/20/13 0535 07/21/13 0945 07/24/13 0500  WBC 4.1 11.6* 5.9  HGB 10.4* 11.5* 10.5*  HCT 31.0* 32.6* 31.2*  PLT 187 220 211    Recent Labs Lab 07/20/13 0535 07/21/13 0945 07/22/13 1110 07/23/13 0500 07/24/13 0500  NA 139 138 140 141 143  K 4.2 4.0 3.9 3.2* 3.7  CL 110 106 105 110 112  CO2 22 23 25 27 24   GLUCOSE 177* 285* 201* 107* 150*  BUN 3* 7 7 5* 5*  CREATININE 0.86 0.86 0.86 0.92 0.95  CALCIUM 8.1* 8.5 8.2* 7.9* 7.6*  ALKPHOS  --   --   --  77  --   AST  --   --   --  17  --   ALT  --   --   --  13  --   ALBUMIN  --   --   --  1.9*  --    CEA 2.2 (normal)  Imaging/Diagnostic Tests:  07/15/13 CT Abdomen  IMPRESSION: Changes in the right lower quadrant consistent with a colon carcinoma with circumferential wall thickening and a degree of obstructive change with dilatation of the cecum and distal terminal ileum with significant to the fecal material. Further evaluation by means of colonoscopy is recommended. A mild amount of ascites is noted. Dilatation of the gallbladder with dilation of the common bile duct 1 cm as described. This is of uncertain etiology. Clinical correlation with laboratory values is recommended.  07/15/13 US Abdomen Complete  IMPRESSION: Gallbladder sludge with positive sonographic Murphy's sign. No calculi are observed. Acalculous cholecystitis not excluded. Extrahepatic biliary ductal dilatation is also  present.  07/19/13 Colonoscopy  Right colon mass with strictures, colonic polyp. Inability to pass scope through proximal colon malignant stricture. 1. Colon, biopsy, Ascending  - ADENOCARCINOMA.  2. Colon, polyp(s), Transverse  - SESSILE SERRATED ADENOMA. NO HIGH GRADE DYSPLASIA OR MALIGNANCY IDENTIFIED  07/20/13 Surgery: Open R-colectomy, Cholecystectomy, Liver Biopsy, Excision Masses (Abd wall, Omental)  Path: chronic cholecystitis, colonic adenocarcinoma metastatic to omentum & abdominal wall, liver bx is fibrotic nodules without evidence of malignancy  Results/Tests Pending at Time of Discharge: None  Discharge Medications:    Medication List         ACCU-CHEK AVIVA PLUS test strip  Generic drug:  glucose blood  USE AS DIRECTED     atorvastatin 40 MG tablet  Commonly known as:  LIPITOR  Take 1 tablet (40 mg total) by mouth daily.     clonazePAM 1 MG tablet  Commonly known as:  KLONOPIN  Take 1 mg by mouth 3 (three) times daily as needed for anxiety.     docusate sodium 100 MG capsule  Commonly known as:  COLACE  Take 1 capsule (100 mg total) by mouth 2 (two) times daily.     furosemide 40 MG tablet  Commonly known as:  LASIX  Take 1 tablet (40 mg total) by mouth 2 (two) times daily.     gabapentin 800 MG tablet  Commonly known as:  NEURONTIN  Take 800 mg by mouth 2 (two) times daily.     hydrOXYzine 100 MG capsule  Commonly known  as:  VISTARIL  Take 100 mg by mouth 3 (three) times daily as needed for anxiety.     ibuprofen 800 MG tablet  Commonly known as:  ADVIL,MOTRIN  Take 1 tablet (800 mg total) by mouth 3 (three) times daily.     insulin glargine 100 UNIT/ML injection  Commonly known as:  LANTUS  Inject 20 Units into the skin daily.     insulin lispro 100 UNIT/ML injection  Commonly known as:  HUMALOG  Inject 2-8 Units into the skin daily. PT USES SLIDING SCALE     lamoTRIgine 100 MG tablet  Commonly known as:  LAMICTAL  Take 100 mg by mouth 2  (two) times daily.     omeprazole 20 MG capsule  Commonly known as:  PRILOSEC  Take 20 mg by mouth daily.     ondansetron 4 MG tablet  Commonly known as:  ZOFRAN  Take 1 tablet (4 mg total) by mouth every 6 (six) hours as needed for nausea.     Oxycodone HCl 10 MG Tabs  Take 1-2 tablets (10-20 mg total) by mouth every 4 (four) hours as needed for moderate pain (you can give with tylenol 650 mg PO).     oxyCODONE-acetaminophen 5-325 MG per tablet  Commonly known as:  PERCOCET/ROXICET  Take 1 tablet by mouth every 4 (four) hours as needed for severe pain.     polyethylene glycol powder powder  Commonly known as:  GLYCOLAX/MIRALAX  Take 255 g (1 Container total) by mouth once.     polyethylene glycol packet  Commonly known as:  MIRALAX / GLYCOLAX  Take 17 g by mouth daily as needed.     valACYclovir 500 MG tablet  Commonly known as:  VALTREX  Take 500 mg by mouth daily.     ziprasidone 80 MG capsule  Commonly known as:  GEODON  Take 80 mg by mouth at bedtime.        Discharge Instructions: Please refer to Patient Instructions section of EMR for full details.  Patient was counseled important signs and symptoms that should prompt return to medical care, changes in medications, dietary instructions, activity restrictions, and follow up appointments.   Follow-Up Appointments: Follow-up Information   Follow up with Liz Malady, MD On 08/02/2013. (You will see the nurse to have your staples removed at 10:10 AM.  Be there about 30 minutes early for check-in.)    Specialty:  General Surgery   Contact information:   [address] [place] [address] [phone]       Follow up with Liz Malady, MD On 08/11/2013. (Your appointment with Dr. Janee Morn is at 3 PM, be there about 20 minutes early for check in.)    Specialty:  General Surgery   Contact information:   [address] [place] [address] [phone]       Follow up with  Mat Carne, MD On 08/10/2013. (3:15 PM )    Specialty:  Family Medicine   Contact information:   [address] [place] [address] [phone]       Follow up with [facility], NI, MD On 08/05/2013. (at 10:00am with Dr. Bertis Ruddy (new patient appointment))    Specialty:  Hematology and Oncology   Contact information:   [address] [place] [address] [phone]       Saralyn Pilar, DO 07/26/2013, 12:02 PM PGY-1, [facility]

## 2013-07-23 NOTE — Progress Notes (Signed)
Anxious about wound/incision. Tolerated clears.   Alert, nad, a little anxious Soft, nd. Incision c/d/i. Appropriate TTP.   Clears. If does well with clears for lunch can have fulls tonight Ambulate, IS Hypokalemia - potassium replaced  Mary Sella. Andrey Campanile, MD, FACS General, Bariatric, & Minimally Invasive Surgery [facility], [place]

## 2013-07-23 NOTE — Progress Notes (Signed)
Patients states she is tolerating her meals find just a lot of pain and she sis still getting pain med every two hours along with her Valium and Klonipin

## 2013-07-24 LAB — CBC
Hemoglobin: 10.5 g/dL — ABNORMAL LOW (ref 12.0–15.0)
MCH: 28.8 pg (ref 26.0–34.0)
MCHC: 33.7 g/dL (ref 30.0–36.0)
MCV: 85.7 fL (ref 78.0–100.0)
Platelets: 211 10*3/uL (ref 150–400)
RBC: 3.64 MIL/uL — ABNORMAL LOW (ref 3.87–5.11)

## 2013-07-24 LAB — GLUCOSE, CAPILLARY
Glucose-Capillary: 132 mg/dL — ABNORMAL HIGH (ref 70–99)
Glucose-Capillary: 144 mg/dL — ABNORMAL HIGH (ref 70–99)
Glucose-Capillary: 79 mg/dL (ref 70–99)
Glucose-Capillary: 82 mg/dL (ref 70–99)

## 2013-07-24 LAB — BASIC METABOLIC PANEL
CO2: 24 mEq/L (ref 19–32)
Calcium: 7.6 mg/dL — ABNORMAL LOW (ref 8.4–10.5)
Glucose, Bld: 150 mg/dL — ABNORMAL HIGH (ref 70–99)
Potassium: 3.7 mEq/L (ref 3.5–5.1)
Sodium: 143 mEq/L (ref 135–145)

## 2013-07-24 MED ORDER — METHOCARBAMOL 500 MG PO TABS
750.0000 mg | ORAL_TABLET | Freq: Three times a day (TID) | ORAL | Status: DC | PRN
  Administered 2013-07-24 – 2013-07-25 (×3): 750 mg via ORAL
  Filled 2013-07-24 (×3): qty 2

## 2013-07-24 MED ORDER — HYDROMORPHONE HCL PF 1 MG/ML IJ SOLN
0.5000 mg | INTRAMUSCULAR | Status: DC | PRN
  Administered 2013-07-24: 1 mg via INTRAVENOUS
  Filled 2013-07-24: qty 1

## 2013-07-24 MED ORDER — OXYCODONE HCL 5 MG PO TABS
5.0000 mg | ORAL_TABLET | ORAL | Status: DC | PRN
  Administered 2013-07-24: 10 mg via ORAL
  Filled 2013-07-24: qty 2

## 2013-07-24 MED ORDER — OXYCODONE HCL 5 MG PO TABS
10.0000 mg | ORAL_TABLET | ORAL | Status: DC | PRN
  Administered 2013-07-24 (×2): 15 mg via ORAL
  Administered 2013-07-24: 20 mg via ORAL
  Administered 2013-07-25: 15 mg via ORAL
  Administered 2013-07-25: 20 mg via ORAL
  Administered 2013-07-25: 15 mg via ORAL
  Administered 2013-07-25: 20 mg via ORAL
  Filled 2013-07-24: qty 3
  Filled 2013-07-24 (×2): qty 4
  Filled 2013-07-24: qty 3
  Filled 2013-07-24: qty 4
  Filled 2013-07-24 (×3): qty 3

## 2013-07-24 MED ORDER — ACETAMINOPHEN 325 MG PO TABS
650.0000 mg | ORAL_TABLET | Freq: Four times a day (QID) | ORAL | Status: DC | PRN
  Administered 2013-07-24 – 2013-07-25 (×3): 650 mg via ORAL
  Filled 2013-07-24 (×2): qty 2

## 2013-07-24 MED ORDER — HYDROMORPHONE HCL PF 1 MG/ML IJ SOLN
0.5000 mg | Freq: Four times a day (QID) | INTRAMUSCULAR | Status: DC | PRN
Start: 2013-07-24 — End: 2013-07-25
  Administered 2013-07-24 – 2013-07-25 (×4): 1 mg via INTRAVENOUS
  Filled 2013-07-24 (×4): qty 1

## 2013-07-24 NOTE — Progress Notes (Signed)
FMTS Attending Note Patient seen and examined by me, discussed with resident team and I agree with Dr Althea Charon' note for today. Plan to get control of patient's pain; advance diet as per Surgery team recommendation. Paula Compton, MD

## 2013-07-24 NOTE — Progress Notes (Signed)
C/o pain when getting up out of bed. No n/v. Tolerated diet. Multiple loose bms per pt.   Alert, nad Soft, nd, expected mild TTP. Incision c/d/i  fulls and adv diet as tolerated Explain pain medication regimen - explained getting up will be sore No anti-diarrheals yet.   Mary Sella. Andrey Campanile, MD, FACS General, Bariatric, & Minimally Invasive Surgery [facility], [place]

## 2013-07-24 NOTE — Progress Notes (Signed)
Pt states she is in pain "I am close to tears it hurts so bad".

## 2013-07-24 NOTE — Progress Notes (Signed)
4 Days Post-Op  Subjective: Still very apprehensive, doesn't want to move says she still hurts,  Tolerated clears, and +BM yesterday.  I have seen her walking.    Objective: Vital signs in last 24 hours: Temp:  [97.9 F (36.6 C)-98.3 F (36.8 C)] 97.9 F (36.6 C) (12/21 0449) Pulse Rate:  [53-58] 53 (12/21 0449) Resp:  [16] 16 (12/21 0449) BP: (108-119)/(60-66) 108/60 mmHg (12/21 0449) SpO2:  [97 %-98 %] 97 % (12/21 0449) Last BM Date: 07/23/13 120 PO recorded., no BM yesterday recorded Diet:  clears Afebrile, VSS Labs OK Intake/Output from previous day: 12/20 0701 - 12/21 0700 In: 1011.3 [P.O.:120; I.V.:831.3; IV Piggyback:60] Out: 200 [Urine:200] Intake/Output this shift:    General appearance: alert, cooperative and no distress Resp: clear to auscultation bilaterally Abdomen:  Soft, incision looks very good, few BS, no distension.  Lab Results:   Recent Labs  07/21/13 0945 07/24/13 0500  WBC 11.6* 5.9  HGB 11.5* 10.5*  HCT 32.6* 31.2*  PLT 220 211    BMET  Recent Labs  07/23/13 0500 07/24/13 0500  NA 141 143  K 3.2* 3.7  CL 110 112  CO2 27 24  GLUCOSE 107* 150*  BUN 5* 5*  CREATININE 0.92 0.95  CALCIUM 7.9* 7.6*   PT/INR No results found for this basename: LABPROT, INR,  in the last 72 hours   Recent Labs Lab 07/23/13 0500  AST 17  ALT 13  ALKPHOS 77  BILITOT 0.1*  PROT 4.7*  ALBUMIN 1.9*     Lipase  No results found for this basename: lipase     Studies/Results: No results found.  Medications: . atorvastatin  40 mg Oral q1800  . enoxaparin (LOVENOX) injection  40 mg Subcutaneous Q24H  . gabapentin  800 mg Oral BID  . insulin aspart  0-15 Units Subcutaneous Q4H  . insulin glargine  15 Units Subcutaneous Daily  . lamoTRIgine  200 mg Oral Daily  . methocarbamol (ROBAXIN) IV  1,000 mg Intravenous Q8H  . nicotine  21 mg Transdermal Daily  . pantoprazole  40 mg Oral Daily  . ramelteon  8 mg Oral QHS  . sodium chloride  10-40 mL  Intracatheter Q12H  . ziprasidone  160 mg Oral QHS   Path: 1. Gallbladder  - CHRONIC CHOLECYSTITIS.  - BENIGN REACTIVE LYMPH NODE.  2. Colon, segmental resection for tumor, Right, Ascending  - COLONIC ADENOCARCINOMA EXTENDING INTO PERICOLONIC CONNECTIVE TISSUE AND  FOCALLY TO SEROSAL SURFACE.  3. Omentum, biopsy, Omental Implants  - METASTATIC ADENOCARCINOMA.  4. Liver, biopsy, Mass  - FIBROTIC NODULE.  - NO MALIGNANCY IDENTIFIED.  5. Soft tissue mass, simple excision, Abdominal wall mass  - METASTATIC ADENOCARCINOMA  Assessment/Plan  1. RIGHT COLON Cancer T4N2M1, GALLSTONES, OMENTAL MASS, ABDOMINAL WALL MASS, LIVER MASS, EVIDENCE OF PELVIC DROP METASTASES  S/p RIGHT COLECTOMY, ChoLECYSTECTOMY, LIVER BIOPSY, EXCISION ABDOMINAL WALL MASS, EXCISION OMENTAL MASS, Liz Malady, MD  07/20/2013.  2. Post-operative ileus  3. Bipolar disorder/anxiety  4. DM unspecified - Hgb A1c 6.9  5. Depression  6. Hep C  7. Leukocytosis  8. Tobacco use  Plan:  Advance her to full liquids now and soft diet at supper if she does well.  Home soon.  I will ask case management to see and begin dialogue with family.  I will defer to medicine on home meds for anxiety/bipolar issues. I am changing PO pain med and making IV pain med for pain not relieved by PO.   LOS:  9 days    Monica [name] 07/24/2013

## 2013-07-25 DIAGNOSIS — E119 Type 2 diabetes mellitus without complications: Secondary | ICD-10-CM

## 2013-07-25 LAB — GLUCOSE, CAPILLARY
Glucose-Capillary: 89 mg/dL (ref 70–99)
Glucose-Capillary: 147 mg/dL — ABNORMAL HIGH (ref 70–99)
Glucose-Capillary: 144 mg/dL — ABNORMAL HIGH (ref 70–99)

## 2013-07-25 MED ORDER — POLYETHYLENE GLYCOL 3350 17 GM/SCOOP PO POWD: 1.0000 | Freq: Once | ORAL | Status: DC

## 2013-07-25 MED ORDER — DOCUSATE SODIUM 100 MG PO CAPS: 100.0000 mg | ORAL_CAPSULE | Freq: Two times a day (BID) | ORAL | Status: DC

## 2013-07-25 MED ORDER — SENNOSIDES-DOCUSATE SODIUM 8.6-50 MG PO TABS: 1.0000 | ORAL_TABLET | Freq: Two times a day (BID) | ORAL | Status: DC

## 2013-07-25 MED ORDER — ONDANSETRON HCL 4 MG PO TABS: 4.0000 mg | ORAL_TABLET | Freq: Four times a day (QID) | ORAL | Status: DC | PRN

## 2013-07-25 MED ORDER — OXYCODONE HCL 10 MG PO TABS: 10.0000 mg | ORAL_TABLET | ORAL | Status: DC | PRN

## 2013-07-25 NOTE — Care Management Note (Signed)
  Page 1 of 1   07/25/2013     9:50:46 AM   CARE MANAGEMENT NOTE 07/25/2013  Patient:  Monica [name], Monica [name]   Account Number:  [id number]  Date Initiated:  07/25/2013  Documentation initiated by:  Ronny Flurry  Subjective/Objective Assessment:     Action/Plan:   Anticipated DC Date:  07/25/2013   Anticipated DC Plan:  HOME/SELF CARE         Choice offered to / List presented to:     DME arranged  3-N-1  Levan Hurst      DME agency  [facility]        Status of service:  Completed, signed off Medicare Important Message given?   (If response is "NO", the following Medicare IM given date fields will be blank) Date Medicare IM given:   Date Additional Medicare IM given:    Discharge Disposition:    Per UR Regulation:    If discussed at Long Length of Stay Meetings, dates discussed:    Comments:  07-25-13 PT recommending no PT follow now , discussed with DR Sofie Hartigan . Patient does not have an gualifying DX for Medicaid to pay for HHPT.    DR Sofie Hartigan will discuss with on rounds .  Ronny Flurry RN BSN [phone]

## 2013-07-25 NOTE — Progress Notes (Signed)
Agree with PTA.    Addisynn Vassell, PT [phone]  

## 2013-07-25 NOTE — Progress Notes (Signed)
Physical Therapy Treatment Patient Details Name: Monica [name] MRN: [id number] DOB: 12-17-1960 Today's Date: 07/25/2013 Time: 0272-5366 PT Time Calculation (min): 17 min  PT Assessment / Plan / Recommendation  History of Present Illness patient is a 52 yo female with RIGHT COLON MASS, GALLSTONES, OMENTAL MASS, ABDOMINAL WALL MASS, LIVER MASS, EVIDENCE OF PELVIC DROP METASTASES.   PT Comments   Patient still in a lot of pain. Agreeable to ambulation. Moving very well. Does not have steps to enter. Encouraged to walk with staff. Will sign off from acute PT needs.   Follow Up Recommendations  No PT follow up     Does the patient have the potential to tolerate intense rehabilitation     Barriers to Discharge        Equipment Recommendations  Rolling walker with 5" wheels;3in1 (PT)    Recommendations for Other Services    Frequency     Progress towards PT Goals Progress towards PT goals: Goals met/education completed, patient discharged from PT  Plan Current plan remains appropriate    Precautions / Restrictions Precautions Precautions: None   Pertinent Vitals/Pain 10/10 surgical after ambulation. premedicated   Mobility  Bed Mobility Bed Mobility: Supine to Sit Supine to Sit: 6: Modified independent (Device/Increase time) Sit to Supine: 6: Modified independent (Device/Increase time) Transfers Sit to Stand: 6: Modified independent (Device/Increase time) Stand to Sit: 6: Modified independent (Device/Increase time) Ambulation/Gait Ambulation/Gait Assistance: 6: Modified independent (Device/Increase time) Ambulation Distance (Feet): 800 Feet Assistive device: None Gait Pattern: Step-through pattern;Decreased stride length;Trunk flexed    Exercises     PT Diagnosis:    PT Problem List:   PT Treatment Interventions:     PT Goals (current goals can now be found in the care plan section)    Visit Information  Last PT Received On: 07/25/13 Assistance Needed:  +1 History of Present Illness: patient is a 52 yo female with RIGHT COLON MASS, GALLSTONES, OMENTAL MASS, ABDOMINAL WALL MASS, LIVER MASS, EVIDENCE OF PELVIC DROP METASTASES.    Subjective Data      Cognition  Cognition Arousal/Alertness: Awake/alert Behavior During Therapy: WFL for tasks assessed/performed Overall Cognitive Status: Within Functional Limits for tasks assessed    Balance     End of Session PT - End of Session Activity Tolerance: Patient tolerated treatment well Patient left: in bed;with call bell/phone within reach Nurse Communication: Mobility status   GP     Fredrich Birks 07/25/2013, 8:36 AM 07/25/2013 Fredrich Birks PTA [phone] pager [phone] office

## 2013-07-25 NOTE — Progress Notes (Signed)
Patient fearful of going home.  Sound is fine.  Small amount of drainage on the wound.  No peritonitis or guarding.  She looks very comfortable.  She should be able to go home today.  Marta Lamas. Gae Bon, MD, FACS [phone] [facility]

## 2013-07-25 NOTE — Progress Notes (Signed)
5 Days Post-Op  Subjective: Her biggest complaint is pain, she says she has a high pain tolerance and was on multiple medicines prior to surgery.  She is tolerating PO's and having BM's. She says sometime she does well other times she just cries with pain. She currently has 10-20 mg of oxycodone q4h, and she is still taking other meds listed below.   Objective: Vital signs in last 24 hours: Temp:  [97.3 F (36.3 C)-98.3 F (36.8 C)] 97.6 F (36.4 C) (12/22 0503) Pulse Rate:  [59-62] 60 (12/22 0503) Resp:  [16] 16 (12/22 0503) BP: (109-112)/(54-67) 109/67 mmHg (12/22 0503) SpO2:  [95 %-97 %] 95 % (12/22 0503) Last BM Date: 07/24/13 I/O ??  140 PO recorded., 2 BM recorded Low fiber diet Afebrile, VSS NO labs     Meds taken yesterday: 20 mg Valium 1600 mg Neurontin 5 mg dilaudid                        Lamictal 200 mg Robaxin 1600 mg 55 mg oxycontin Rozerem 8 mg Geodon 160 mg Intake/Output from previous day: 12/21 0701 - 12/22 0700 In: 582.5 [P.O.:140; I.V.:442.5] Out: -  Intake/Output this shift:    General appearance: alert, cooperative and no distress Resp: clear to auscultation bilaterally GI: soft, no distension, wound looks good, tolerating low fiber diet and having BM.  Lab Results:   Recent Labs  07/24/13 0500  WBC 5.9  HGB 10.5*  HCT 31.2*  PLT 211    BMET  Recent Labs  07/23/13 0500 07/24/13 0500  NA 141 143  K 3.2* 3.7  CL 110 112  CO2 27 24  GLUCOSE 107* 150*  BUN 5* 5*  CREATININE 0.92 0.95  CALCIUM 7.9* 7.6*   PT/INR No results found for this basename: LABPROT, INR,  in the last 72 hours   Recent Labs Lab 07/23/13 0500  AST 17  ALT 13  ALKPHOS 77  BILITOT 0.1*  PROT 4.7*  ALBUMIN 1.9*     Lipase  No results found for this basename: lipase     Studies/Results: No results found.  Medications: . atorvastatin  40 mg Oral q1800  . enoxaparin (LOVENOX) injection  40 mg Subcutaneous Q24H  . gabapentin  800 mg Oral BID  .  insulin aspart  0-15 Units Subcutaneous Q4H  . insulin glargine  15 Units Subcutaneous Daily  . lamoTRIgine  200 mg Oral Daily  . nicotine  21 mg Transdermal Daily  . pantoprazole  40 mg Oral Daily  . ramelteon  8 mg Oral QHS  . sodium chloride  10-40 mL Intracatheter Q12H  . ziprasidone  160 mg Oral QHS     1. Gallbladder  - CHRONIC CHOLECYSTITIS.  - BENIGN REACTIVE LYMPH NODE.  2. Colon, segmental resection for tumor, Right, Ascending  - COLONIC ADENOCARCINOMA EXTENDING INTO PERICOLONIC CONNECTIVE TISSUE AND  FOCALLY TO SEROSAL SURFACE.  3. Omentum, biopsy, Omental Implants  - METASTATIC ADENOCARCINOMA.  4. Liver, biopsy, Mass  - FIBROTIC NODULE.  - NO MALIGNANCY IDENTIFIED.  5. Soft tissue mass, simple excision, Abdominal wall mass  - METASTATIC ADENOCARCINOMA  Assessment/Plan  1. RIGHT COLON Cancer T4N2M1, GALLSTONES, OMENTAL MASS, ABDOMINAL WALL MASS, LIVER MASS, EVIDENCE OF PELVIC DROP METASTASES  S/p RIGHT COLECTOMY, ChoLECYSTECTOMY, LIVER BIOPSY, EXCISION ABDOMINAL WALL MASS, EXCISION OMENTAL MASS, Liz Malady, MD  07/20/2013. POD 5 2. Post-operative ileus  3. Bipolar disorder/anxiety  4. DM unspecified - Hgb A1c 6.9  5.  Depression  6. Hep C  7. Leukocytosis  8. Tobacco use 9.Diabetic neuropathy 10. Left knee exploration with fatty tumor removal (07/13/13, I can't find exact date, from admit note info)  Plan:  From a surgical standpoint she is doing well and I would let her go home today.  The main issue is her pain and anxiety control.  I will discuss with Attending, and Dr. Lindie Spruce.  LOS: 10 days    Jay Haskew 07/25/2013

## 2013-07-25 NOTE — Progress Notes (Signed)
Patient given prescription and discharge instructions; all questions answered.  Awaiting patient's ride.

## 2013-07-25 NOTE — Progress Notes (Signed)
Family Medicine Teaching Service Daily Progress Note Intern Pager: [phone]  Patient name: Monica [name] Medical record number: [id number] Date of birth: May 07, 1961 Age: 52 y.o. Gender: female  Primary Care Provider: Mat Carne, MD Consultants: general surgery, GI, oncology (Dr. Bertis Ruddy) Code Status: full  Pt Overview and Major Events to Date: 12/12 - admitted with bowel obstruction due to colon mass and likely acalculous cholecystis 12/15 - continue bowel prep today, plan colonoscopy (12/16), tentative surgery (12/17) 12/16 - colonoscopy (R-colon Adenocarcinoma) 12/17 - Surgery right colectomy and cholecystectomy, mets to liver, omentum, abdominal wall found 12/19 - PT --> rec HH, c/s Oncology (Dr. Bertis Ruddy) --> to complete remaining Onc w/u as outpt 12/21 - advanced diet to full, PO pain regimen, +BM 12/22 - POD # 5, tolerating advanced diet, discharge today  Assessment and Plan: DECLYN OFFIELD is a 52 y.o. female presenting with severe right sided abdominal pain with large bowel obstruction from a mass at the ileocecal junction as well as acalculous cholecystitis, with new diagnosis of metastatic colonic adenocarcinoma. PMH is significant for Type 1 DM, bipolar disorder, hypertension, hepatitis C, history IV drug use, and recent knee surgery.   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 GI performed colonoscopy (identified R-colon mass, malignant stricture, biopsied confirmed adenocarcinoma). CEA 2.2 (normal range) - General Surgery following, greatly appreciate all recommendations   - POD #5 s/p (07/20/13) R-colectomy, Cholecystectomy, Liver bx, Excision of multiple masses (abdominal wall, omental)   - surgical findings consistent with Stage 4 Colon CA,  multiple suspected mets to liver, abdominal cavity   - Results discussed with patient   - tolerating low fiber diet - Pain Management: Oxy IR to 10-20mg  q 4 hr PRN / Dilaudid IV 0.5-1mg  q 4 hr PRN brkthrough - plan to discharge on Oxycodone PO 10-20mg  q 4 hr    - CEA 2.2 (normal range) - Seen by Oncology (Dr. Bertis Ruddy) - planning for outpatient PET/CT scan, outpt placement of port, f/u in oncology clinic in 2 weeks. Needs 4-5 weeks healing before starting chemotherapy. Onc also to add on KRAS testing to pathology. Greatly appreciate oncology recommendations.  # Acalculous cholecystitis, sludge with extrahepatic biliary ductal dilatation Abdominal US with noted sludge and gall bladder wall thickening. - S/p cholecystectomy 12/17, path suggestive of chronic cholecystitis  # Type 1 DM History of very brittle diabetes. - CBGs q 4 hr (currently NPO), CBG's well controlled currently - Novolog moderate SSI - Lantus on 15u daily  # Left knee surgery Patient is s/p resection of fatty tumor from anterior left knee, no evidence of infection or post-operative complication  - Keep dressing in place with steri-strips, reportedly able to remove strips  # Bipolar Disorder/Anxiety  - Continue home geodon, clonazepam, atarax, and ramelteon  - lamictal to 200mg  in AM - continue home Diazepam 10mg  TID PRN  # Nicotine Abuse - smokes 1 ppd  - Nicotine patch to 21mg   # HLD  - home atorvastatin 40mg  daily  # Hypokalemia - Resolved - K 3.7  # h/o Chronic LE Edema Home regimen of Lasix PO 40mg  BID for LE swelling. Held in hospital - noted mild inc b/l LE edema, decide against continuing Lasix for symptom control in setting of pt w/o CHF  FEN/GI: D5-1/2NS @ 50 ml/hr; full liquid diet  Prophylaxis: lovenox  Disposition: Admitted  to FPTS, inpatient status, pending complete work-up and surgical interventions with recovery and improved clinical course, expect patient to be discharged to home with [facility]  after multiple day post-op recovery. To be discharged to home today. Follow-up arrangements to be made with Oncology.  Subjective: No acute events overnight. Continues to complain of abdominal pain without significant relief from PO medications. Previously loose stools, now no BM x 24 hrs. Tolerating ambulation. Advanced to low fiber diet, tolerating well. Agreeable to discharge home today  Objective: Temp:  [97.5 F (36.4 C)-98.3 F (36.8 C)] 97.5 F (36.4 C) (12/22 1341) Pulse Rate:  [60-66] 66 (12/22 1341) Resp:  [16] 16 (12/22 1341) BP: (109-129)/(54-80) 129/80 mmHg (12/22 1341) SpO2:  [95 %-97 %] 96 % (12/22 1341) Physical Exam: General: laying in bed eating breakfast, anxious but cooperative, NAD Cardiovascular: RRR, no murmurs  Respiratory: CTAB Abdomen: soft, moderate appropriate tenderness, non-distended, incision c/d/i with staples present, +active BS Extremities: non-tender LE, left knee s/p surgery, steri-strips Skin: no rashes  Neuro: alert, oriented, no focal deficits  Laboratory:  Recent Labs Lab 07/20/13 0535 07/21/13 0945 07/24/13 0500  WBC 4.1 11.6* 5.9  HGB 10.4* 11.5* 10.5*  HCT 31.0* 32.6* 31.2*  PLT 187 220 211    Recent Labs Lab 07/22/13 1110 07/23/13 0500 07/24/13 0500  NA 140 141 143  K 3.9 3.2* 3.7  CL 105 110 112  CO2 25 27 24   BUN 7 5* 5*  CREATININE 0.86 0.92 0.95  CALCIUM 8.2* 7.9* 7.6*  PROT  --  4.7*  --   BILITOT  --  0.1*  --   ALKPHOS  --  77  --   ALT  --  13  --   AST  --  17  --   GLUCOSE 201* 107* 150*    CEA 2.2 (normal)  Imaging/Diagnostic Tests: 07/15/13 CT Abdomen IMPRESSION: Changes in the right lower quadrant consistent with a colon carcinoma with circumferential wall thickening and a degree of obstructive change with dilatation of the cecum and distal terminal ileum with significant to the fecal material. Further evaluation by means of colonoscopy is recommended. A mild amount of ascites is noted. Dilatation of  the gallbladder with dilation of the common bile duct 1 cm as described. This is of uncertain etiology. Clinical correlation with laboratory values is recommended.  07/15/13 US Abdomen Complete  IMPRESSION: Gallbladder sludge with positive sonographic Murphy's sign. No calculi are observed. Acalculous cholecystitis not excluded. Extrahepatic biliary ductal dilatation is also present.  07/19/13 Colonoscopy Right colon mass with strictures, colonic polyp. Inability to pass scope through proximal colon malignant stricture. 1. Colon, biopsy, Ascending - ADENOCARCINOMA. 2. Colon, polyp(s), Transverse - SESSILE SERRATED ADENOMA. NO HIGH GRADE DYSPLASIA OR MALIGNANCY IDENTIFIED  07/20/13 Surgery: Open R-colectomy, Cholecystectomy, Liver Biopsy, Excision Masses (Abd wall, Omental) Path: chronic cholecystitis, colonic adenocarcinoma metastatic to omentum & abdominal wall, liver bx is fibrotic nodules without evidence of malignancy  Saralyn Pilar, DO 07/25/2013, 5:22 PM PGY-1, [place] [facility] FPTS Intern pager: [phone], text pages welcome

## 2013-07-25 NOTE — Progress Notes (Signed)
FMTS Attending Note  I personally saw and evaluated the patient. The plan of care was discussed with the resident team. I agree with the assessment and plan as documented by the resident.   Agree with plan to dishcharge home on Oxycodone for pain control. Suspect component of neuropathic pain s/p abdominal surgery. Continue home Gabapentin. Oncology follow up as documented.   Donnella Sham MD

## 2013-07-26 ENCOUNTER — Telehealth: Payer: Self-pay | Admitting: Hematology and Oncology

## 2013-07-26 NOTE — Telephone Encounter (Signed)
C/D 07/26/13 for appt. 08/05/13

## 2013-07-27 NOTE — Discharge Summary (Signed)
I agree with the discharge summary as documented.   Nour Scalise MD  

## 2013-07-29 ENCOUNTER — Telehealth (INDEPENDENT_AMBULATORY_CARE_PROVIDER_SITE_OTHER): Payer: Self-pay | Admitting: *Deleted

## 2013-07-29 NOTE — Telephone Encounter (Signed)
Pt called in s/p partial colectomy stating that she has not had a BM since 07/24/13.  She is not having any abdominal discomfort, states she has been drinking liquid and moving around as well as taking her miralax and stool softerners as prescribed.  I instructed her to double her dose on the miralax and stool softener and to decrease her pain medication by taking ibuprofen if needed in between doses as well as the continue increasing the amount of water and fiber she is taking in.  I instructed her to call our office if she still has not had any results within 24-48 hours.  She is agreeable with this plan and will call with any other concerns.

## 2013-08-01 ENCOUNTER — Encounter (HOSPITAL_COMMUNITY): Payer: Self-pay

## 2013-08-01 ENCOUNTER — Other Ambulatory Visit (HOSPITAL_COMMUNITY): Payer: Self-pay | Admitting: Family Medicine

## 2013-08-02 ENCOUNTER — Encounter (INDEPENDENT_AMBULATORY_CARE_PROVIDER_SITE_OTHER): Payer: Self-pay

## 2013-08-02 ENCOUNTER — Ambulatory Visit (INDEPENDENT_AMBULATORY_CARE_PROVIDER_SITE_OTHER): Payer: Medicaid Other

## 2013-08-02 VITALS — BP 100/68 | HR 68 | Temp 97.2°F | Resp 16 | Ht 65.0 in | Wt 160.4 lb

## 2013-08-02 DIAGNOSIS — Z4802 Encounter for removal of sutures: Secondary | ICD-10-CM

## 2013-08-02 NOTE — Progress Notes (Signed)
Patient comes into office today for nurse visit for staple removal.  Patient s/p Colectomy on 07/20/13.  Incision appears to be healing well, without redness, swelling or drainage.  Proceeded with staple removal.  Incision intact.  Placed steri-strips over incision.  Patient tolerated well.  Patient scheduled for post op appointment and has been given date & time on 08/11/13 w/Dr. Janee Morn.

## 2013-08-05 ENCOUNTER — Other Ambulatory Visit (INDEPENDENT_AMBULATORY_CARE_PROVIDER_SITE_OTHER): Payer: Self-pay | Admitting: General Surgery

## 2013-08-05 ENCOUNTER — Ambulatory Visit (HOSPITAL_BASED_OUTPATIENT_CLINIC_OR_DEPARTMENT_OTHER): Payer: Medicaid Other | Admitting: Hematology and Oncology

## 2013-08-05 ENCOUNTER — Telehealth: Payer: Self-pay | Admitting: Hematology and Oncology

## 2013-08-05 ENCOUNTER — Telehealth (INDEPENDENT_AMBULATORY_CARE_PROVIDER_SITE_OTHER): Payer: Self-pay | Admitting: General Surgery

## 2013-08-05 ENCOUNTER — Ambulatory Visit: Payer: Medicaid Other

## 2013-08-05 VITALS — BP 127/77 | HR 63 | Temp 97.6°F | Resp 20 | Ht 65.0 in | Wt 161.8 lb

## 2013-08-05 DIAGNOSIS — F319 Bipolar disorder, unspecified: Secondary | ICD-10-CM

## 2013-08-05 DIAGNOSIS — C182 Malignant neoplasm of ascending colon: Secondary | ICD-10-CM

## 2013-08-05 DIAGNOSIS — C189 Malignant neoplasm of colon, unspecified: Secondary | ICD-10-CM

## 2013-08-05 DIAGNOSIS — C786 Secondary malignant neoplasm of retroperitoneum and peritoneum: Secondary | ICD-10-CM

## 2013-08-05 DIAGNOSIS — E119 Type 2 diabetes mellitus without complications: Secondary | ICD-10-CM

## 2013-08-05 DIAGNOSIS — F17200 Nicotine dependence, unspecified, uncomplicated: Secondary | ICD-10-CM

## 2013-08-05 DIAGNOSIS — G8929 Other chronic pain: Secondary | ICD-10-CM

## 2013-08-05 DIAGNOSIS — M25569 Pain in unspecified knee: Secondary | ICD-10-CM

## 2013-08-05 DIAGNOSIS — D649 Anemia, unspecified: Secondary | ICD-10-CM

## 2013-08-05 DIAGNOSIS — G609 Hereditary and idiopathic neuropathy, unspecified: Secondary | ICD-10-CM

## 2013-08-05 MED ORDER — LIDOCAINE-PRILOCAINE 2.5-2.5 % EX CREA: 1.0000 "application " | TOPICAL_CREAM | CUTANEOUS | Status: DC | PRN

## 2013-08-05 MED ORDER — DEXAMETHASONE 4 MG PO TABS: 8.0000 mg | ORAL_TABLET | Freq: Two times a day (BID) | ORAL | Status: DC

## 2013-08-05 MED ORDER — ONDANSETRON HCL 8 MG PO TABS
8.0000 mg | ORAL_TABLET | Freq: Three times a day (TID) | ORAL | Status: DC | PRN
Start: 2013-08-05 — End: 2014-04-20

## 2013-08-05 MED ORDER — PROCHLORPERAZINE MALEATE 10 MG PO TABS: 10.0000 mg | ORAL_TABLET | Freq: Four times a day (QID) | ORAL | Status: DC | PRN

## 2013-08-05 NOTE — Telephone Encounter (Signed)
gv adn pritned aptp sched and avs for pt for Jan ....gv pt bariumm///sed added tx

## 2013-08-05 NOTE — Progress Notes (Signed)
[place] OFFICE PROGRESS NOTE  Patient Care Team: Angelica Ran, MD as PCP - General (Family Medicine) Windy Kalata, MD as Consulting Physician (Nephrology) Leatrice Jewels Rayetta Pigg, MD as Consulting Physician (Psychiatry)  DIAGNOSIS: Stage IV metastatic colon cancer, KRAS positive  SUMMARY OF ONCOLOGIC HISTORY: Oncology History   Colon cancer, KRAS positive   Primary site: Colon and Rectum (Right)   Staging method: AJCC 7th Edition   Clinical: (T4b, N2b, M1) signed by Heath Lark, MD on 08/05/2013  9:52 AM   Pathologic: Stage IVB (T4b, N2b, M1b) signed by Heath Lark, MD on 08/05/2013 10:21 AM   Summary: Stage IVB (T4b, N2b, M1b)      Colon cancer   07/15/2013 - 07/25/2013 Hospital Admission The patient presented to the hospital with pain in her abdomen and underwent surgery for colon cancer   07/15/2013 Imaging CT scan of the abdomen show bowel dilatation suspicious for malignancy   07/17/2013 Tumor Marker Preoperative CEA was 2.2, normal   07/19/2013 Procedure Colonoscopy revealed ascending colon lesion with significant stricture and biopsy showed adenocarcinoma   07/20/2013 Surgery She underwent right hemicolectomy, cholecystectomy, liver biopsy, excision of abdomen no wall mass and omental mass. During surgery, she was also found to have drop metastasis    INTERVAL HISTORY: Monica [name] 53 y.o. female returns for further followup. She said her persistent pain along the incision wound She is eating well. Denies any nausea or vomiting. No change in bowel habits. I have reviewed the past medical history, past surgical history, social history and family history with the patient and they are unchanged from previous note.  ALLERGIES:  has No Known Allergies.  MEDICATIONS:  Current Outpatient Prescriptions  Medication Sig Dispense Refill  . ACCU-CHEK AVIVA PLUS test strip USE AS DIRECTED  100 each  10  . atorvastatin (LIPITOR) 40 MG tablet Take 1  tablet (40 mg total) by mouth daily.  90 tablet  3  . clonazePAM (KLONOPIN) 1 MG tablet Take 1 mg by mouth 3 (three) times daily as needed for anxiety.       . docusate sodium (COLACE) 100 MG capsule Take 1 capsule (100 mg total) by mouth 2 (two) times daily.  60 capsule  0  . furosemide (LASIX) 40 MG tablet Take 1 tablet (40 mg total) by mouth 2 (two) times daily.  60 tablet  5  . gabapentin (NEURONTIN) 800 MG tablet Take 800 mg by mouth 2 (two) times daily.      . hydrOXYzine (VISTARIL) 100 MG capsule Take 100 mg by mouth 3 (three) times daily as needed for anxiety.       Marland Kitchen ibuprofen (ADVIL,MOTRIN) 800 MG tablet Take 1 tablet (800 mg total) by mouth 3 (three) times daily.  21 tablet  0  . insulin glargine (LANTUS) 100 UNIT/ML injection Inject 20 Units into the skin daily.      . insulin lispro (HUMALOG) 100 UNIT/ML injection Inject 2-8 Units into the skin daily. PT USES SLIDING SCALE  10 mL  3  . lamoTRIgine (LAMICTAL) 100 MG tablet Take 100 mg by mouth 2 (two) times daily.      Marland Kitchen omeprazole (PRILOSEC) 20 MG capsule Take 20 mg by mouth daily.      . ondansetron (ZOFRAN) 4 MG tablet Take 1 tablet (4 mg total) by mouth every 6 (six) hours as needed for nausea.  20 tablet  0  . oxyCODONE 10 MG TABS Take 1-2 tablets (10-20 mg total) by mouth  every 4 (four) hours as needed for moderate pain (you can give with tylenol 650 mg PO).  90 tablet  0  . oxyCODONE-acetaminophen (PERCOCET/ROXICET) 5-325 MG per tablet Take 1 tablet by mouth every 4 (four) hours as needed for severe pain.      . polyethylene glycol (MIRALAX / GLYCOLAX) packet Take 17 g by mouth daily as needed.      . polyethylene glycol powder (GLYCOLAX/MIRALAX) powder USE 1 CONTAINER AS DIRECTED  255 g  3  . ramelteon (ROZEREM) 8 MG tablet Take 8 mg by mouth at bedtime.      . valACYclovir (VALTREX) 500 MG tablet Take 500 mg by mouth daily.      . ziprasidone (GEODON) 80 MG capsule Take 80 mg by mouth at bedtime.        No current  facility-administered medications for this visit.    REVIEW OF SYSTEMS:   Constitutional: Denies fevers, chills or abnormal weight loss Eyes: Denies blurriness of vision Ears, nose, mouth, throat, and face: Denies mucositis or sore throat Respiratory: Denies cough, dyspnea or wheezes Cardiovascular: Denies palpitation, chest discomfort or lower extremity swelling Skin: Denies abnormal skin rashes Lymphatics: Denies new lymphadenopathy or easy bruising Neurological:Denies numbness, tingling or new weaknesses Behavioral/Psych: Mood is stable, no new changes  All other systems were reviewed with the patient and are negative.  PHYSICAL EXAMINATION: ECOG PERFORMANCE STATUS: 1 - Symptomatic but completely ambulatory  Filed Vitals:   08/05/13 0941  BP: 127/77  Pulse: 63  Temp: 97.6 F (36.4 C)  Resp: 20   Filed Weights   08/05/13 0941  Weight: 161 lb 12.8 oz (73.392 kg)    GENERAL:alert, no distress and comfortable SKIN: skin color, texture, turgor are normal, no rashes or significant lesions EYES: normal, Conjunctiva are pink and non-injected, sclera clear OROPHARYNX:no exudate, no erythema and lips, buccal mucosa, and tongue normal  NECK: supple, thyroid normal size, non-tender, without nodularity LYMPH:  no palpable lymphadenopathy in the cervical, axillary or inguinal LUNGS: clear to auscultation and percussion with normal breathing effort HEART: regular rate & rhythm and no murmurs and no lower extremity edema ABDOMEN:abdomen soft, non-tender and normal bowel sounds. Well-healed surgical scar Musculoskeletal:no cyanosis of digits and no clubbing  NEURO: alert & oriented x 3 with fluent speech, no focal motor/sensory deficits  LABORATORY DATA:  I have reviewed the data as listed    Component Value Date/Time   NA 143 07/24/2013 0500   K 3.7 07/24/2013 0500   CL 112 07/24/2013 0500   CO2 24 07/24/2013 0500   GLUCOSE 150* 07/24/2013 0500   BUN 5* 07/24/2013 0500    CREATININE 0.95 07/24/2013 0500   CREATININE 1.38* 05/26/2013 1703   CALCIUM 7.6* 07/24/2013 0500   PROT 4.7* 07/23/2013 0500   ALBUMIN 1.9* 07/23/2013 0500   AST 17 07/23/2013 0500   ALT 13 07/23/2013 0500   ALKPHOS 77 07/23/2013 0500   BILITOT 0.1* 07/23/2013 0500   GFRNONAA 68* 07/24/2013 0500   GFRAA 79* 07/24/2013 0500    No results found for this basename: SPEP, UPEP,  kappa and lambda light chains    Lab Results  Component Value Date   WBC 5.9 07/24/2013   NEUTROABS 7.8* 07/15/2013   HGB 10.5* 07/24/2013   HCT 31.2* 07/24/2013   MCV 85.7 07/24/2013   PLT 211 07/24/2013      Chemistry      Component Value Date/Time   NA 143 07/24/2013 0500   K 3.7 07/24/2013 0500  CL 112 07/24/2013 0500   CO2 24 07/24/2013 0500   BUN 5* 07/24/2013 0500   CREATININE 0.95 07/24/2013 0500   CREATININE 1.38* 05/26/2013 1703      Component Value Date/Time   CALCIUM 7.6* 07/24/2013 0500   ALKPHOS 77 07/23/2013 0500   AST 17 07/23/2013 0500   ALT 13 07/23/2013 0500   BILITOT 0.1* 07/23/2013 0500       RADIOGRAPHIC STUDIES: I have personally reviewed the radiological images as listed and agreed with the findings in the report.   ASSESSMENT & PLAN:  #1 T4, N2, M1 metastatic colon cancer I suspect the patient may still have persistent disease. She underwent extensive resection but there were evidence of omental metastasis as well as drop metastasis I recommend repeat staging with CT scan of the chest, abdomen and pelvis. The patient understood she will require palliative chemotherapy. I will contact her surgeon for Infuse-a-Port placement. I would get the patient for chemotherapy education class. I would like to start her on chemotherapy, end of the month, or 6 weeks away from recent surgery. #2 knee pain The patient has surgery to her knee prior to her diagnosis of cancer. I will order bone scan to make sure she does not have any metastatic disease to the bone #3 chronic  pain The patient has a significant history of addiction and polysubstance abuse. She is currently managed in any addiction clinic. I suspect she may have persistent disease in her abdomen that could cause chronic pain. I will be willing to prescribe her pain medications in the future if needed but I like her to discuss with her counseling and addiction clinic first #4 anemia This is likely anemia of chronic disease. The patient denies recent history of bleeding such as epistaxis, hematuria or hematochezia. She is asymptomatic from the anemia. We will observe for now.  She does not require transfusion now.  #5 diabetes This might complicate her treatment in the future. She'll continue insulin management #6 peripheral neuropathy I will do some minor at dose adjustment with oxaliplatin in the future due to her baseline peripheral neuropathy #7 bipolar disorder Furthermore affect appear to be appropriate. She will continue on her current antipsychotic medications #8 tobacco use The patient is placed on nicotine patch  Orders Placed This Encounter  Procedures  . CT Chest W Contrast    Standing Status: Future     Number of Occurrences:      Standing Expiration Date: 10/05/2014    Order Specific Question:  Reason for Exam (SYMPTOM  OR DIAGNOSIS REQUIRED)    Answer:  staging colon cancer s/p resection    Order Specific Question:  Is the patient pregnant?    Answer:  No    Order Specific Question:  Preferred imaging location?    Answer:  [facility]  . CT Abdomen Pelvis W Contrast    Standing Status: Future     Number of Occurrences:      Standing Expiration Date: 11/05/2014    Order Specific Question:  Reason for Exam (SYMPTOM  OR DIAGNOSIS REQUIRED)    Answer:  staging colon cancer    Order Specific Question:  Is the patient pregnant?    Answer:  No    Order Specific Question:  Preferred imaging location?    Answer:  [facility]    Standing  Status: Future     Number of Occurrences:      Standing Expiration Date:  10/05/2014    Order Specific Question:  Reason for Exam (SYMPTOM  OR DIAGNOSIS REQUIRED)    Answer:  staging colon cancer, left knee pain    Order Specific Question:  Is the patient pregnant?    Answer:  No    Order Specific Question:  Preferred imaging location?    Answer:  [facility]  . Comprehensive metabolic panel    Standing Status: Standing     Number of Occurrences: 9     Standing Expiration Date: 08/05/2014  . CBC & Diff and Retic    Standing Status: Standing     Number of Occurrences: 9     Standing Expiration Date: 08/05/2014  . Ferritin    Standing Status: Future     Number of Occurrences:      Standing Expiration Date: 08/05/2014  . UA Protein, Dipstick - [facility]    Standing Status: Future     Number of Occurrences:      Standing Expiration Date: 08/05/2014   All questions were answered. The patient knows to call the clinic with any problems, questions or concerns. No barriers to learning was detected.   [place], [place], MD 08/05/2013 11:27 AM

## 2013-08-05 NOTE — Telephone Encounter (Signed)
Called patient and discussed port-a-cath placement including risks and benefits. She is agreeable. Will plan for next week. Georganna Skeans, MD, MPH, FACS Pager: [phone]

## 2013-08-05 NOTE — Patient Instructions (Signed)
Oxaliplatin Injection What is this medicine? OXALIPLATIN (ox AL i PLA tin) is a chemotherapy drug. It targets fast dividing cells, like cancer cells, and causes these cells to die. This medicine is used to treat cancers of the colon and rectum, and many other cancers. This medicine may be used for other purposes; ask your health care provider or pharmacist if you have questions. COMMON BRAND NAME(S): Eloxatin What should I tell my health care provider before I take this medicine? They need to know if you have any of these conditions: -kidney disease -an unusual or allergic reaction to oxaliplatin, other chemotherapy, other medicines, foods, dyes, or preservatives -pregnant or trying to get pregnant -breast-feeding How should I use this medicine? This drug is given as an infusion into a vein. It is administered in a hospital or clinic by a specially trained health care professional. Talk to your pediatrician regarding the use of this medicine in children. Special care may be needed. Overdosage: If you think you have taken too much of this medicine contact a poison control center or emergency room at once. NOTE: This medicine is only for you. Do not share this medicine with others. What if I miss a dose? It is important not to miss a dose. Call your doctor or health care professional if you are unable to keep an appointment. What may interact with this medicine? -medicines to increase blood counts like filgrastim, pegfilgrastim, sargramostim -probenecid -some antibiotics like amikacin, gentamicin, neomycin, polymyxin B, streptomycin, tobramycin -zalcitabine Talk to your doctor or health care professional before taking any of these medicines: -acetaminophen -aspirin -ibuprofen -ketoprofen -naproxen This list may not describe all possible interactions. Give your health care provider a list of all the medicines, herbs, non-prescription drugs, or dietary supplements you use. Also tell them if  you smoke, drink alcohol, or use illegal drugs. Some items may interact with your medicine. What should I watch for while using this medicine? Your condition will be monitored carefully while you are receiving this medicine. You will need important blood work done while you are taking this medicine. This medicine can make you more sensitive to cold. Do not drink cold drinks or use ice. Cover exposed skin before coming in contact with cold temperatures or cold objects. When out in cold weather wear warm clothing and cover your mouth and nose to warm the air that goes into your lungs. Tell your doctor if you get sensitive to the cold. This drug may make you feel generally unwell. This is not uncommon, as chemotherapy can affect healthy cells as well as cancer cells. Report any side effects. Continue your course of treatment even though you feel ill unless your doctor tells you to stop. In some cases, you may be given additional medicines to help with side effects. Follow all directions for their use. Call your doctor or health care professional for advice if you get a fever, chills or sore throat, or other symptoms of a cold or flu. Do not treat yourself. This drug decreases your body's ability to fight infections. Try to avoid being around people who are sick. This medicine may increase your risk to bruise or bleed. Call your doctor or health care professional if you notice any unusual bleeding. Be careful brushing and flossing your teeth or using a toothpick because you may get an infection or bleed more easily. If you have any dental work done, tell your dentist you are receiving this medicine. Avoid taking products that contain aspirin, acetaminophen, ibuprofen, naproxen,   or ketoprofen unless instructed by your doctor. These medicines may hide a fever. Do not become pregnant while taking this medicine. Women should inform their doctor if they wish to become pregnant or think they might be pregnant. There  is a potential for serious side effects to an unborn child. Talk to your health care professional or pharmacist for more information. Do not breast-feed an infant while taking this medicine. Call your doctor or health care professional if you get diarrhea. Do not treat yourself. What side effects may I notice from receiving this medicine? Side effects that you should report to your doctor or health care professional as soon as possible: -allergic reactions like skin rash, itching or hives, swelling of the face, lips, or tongue -low blood counts - This drug may decrease the number of white blood cells, red blood cells and platelets. You may be at increased risk for infections and bleeding. -signs of infection - fever or chills, cough, sore throat, pain or difficulty passing urine -signs of decreased platelets or bleeding - bruising, pinpoint red spots on the skin, black, tarry stools, nosebleeds -signs of decreased red blood cells - unusually weak or tired, fainting spells, lightheadedness -breathing problems -chest pain, pressure -cough -diarrhea -jaw tightness -mouth sores -nausea and vomiting -pain, swelling, redness or irritation at the injection site -pain, tingling, numbness in the hands or feet -problems with balance, talking, walking -redness, blistering, peeling or loosening of the skin, including inside the mouth -trouble passing urine or change in the amount of urine Side effects that usually do not require medical attention (report to your doctor or health care professional if they continue or are bothersome): -changes in vision -constipation -hair loss -loss of appetite -metallic taste in the mouth or changes in taste -stomach pain This list may not describe all possible side effects. Call your doctor for medical advice about side effects. You may report side effects to FDA at 1-800-FDA-1088. Where should I keep my medicine? This drug is given in a hospital or clinic and  will not be stored at home. NOTE: This sheet is a summary. It may not cover all possible information. If you have questions about this medicine, talk to your doctor, pharmacist, or health care provider.  2014, [employer] Standard. (2008-02-15 17:22:47) Fluorouracil, 5-FU injection What is this medicine? FLUOROURACIL, 5-FU (flure oh YOOR a sil) is a chemotherapy drug. It slows the growth of cancer cells. This medicine is used to treat many types of cancer like breast cancer, colon or rectal cancer, pancreatic cancer, and stomach cancer. This medicine may be used for other purposes; ask your health care provider or pharmacist if you have questions. COMMON BRAND NAME(S): Adrucil What should I tell my health care provider before I take this medicine? They need to know if you have any of these conditions: -blood disorders -dihydropyrimidine dehydrogenase (DPD) deficiency -infection (especially a virus infection such as chickenpox, cold sores, or herpes) -kidney disease -liver disease -malnourished, poor nutrition -recent or ongoing radiation therapy -an unusual or allergic reaction to fluorouracil, other chemotherapy, other medicines, foods, dyes, or preservatives -pregnant or trying to get pregnant -breast-feeding How should I use this medicine? This drug is given as an infusion or injection into a vein. It is administered in a hospital or clinic by a specially trained health care professional. Talk to your pediatrician regarding the use of this medicine in children. Special care may be needed. Overdosage: If you think you have taken too much of this medicine contact a  poison control center or emergency room at once. NOTE: This medicine is only for you. Do not share this medicine with others. What if I miss a dose? It is important not to miss your dose. Call your doctor or health care professional if you are unable to keep an appointment. What may interact with this  medicine? -allopurinol -cimetidine -dapsone -digoxin -hydroxyurea -leucovorin -levamisole -medicines for seizures like ethotoin, fosphenytoin, phenytoin -medicines to increase blood counts like filgrastim, pegfilgrastim, sargramostim -medicines that treat or prevent blood clots like warfarin, enoxaparin, and dalteparin -methotrexate -metronidazole -pyrimethamine -some other chemotherapy drugs like busulfan, cisplatin, estramustine, vinblastine -trimethoprim -trimetrexate -vaccines Talk to your doctor or health care professional before taking any of these medicines: -acetaminophen -aspirin -ibuprofen -ketoprofen -naproxen This list may not describe all possible interactions. Give your health care provider a list of all the medicines, herbs, non-prescription drugs, or dietary supplements you use. Also tell them if you smoke, drink alcohol, or use illegal drugs. Some items may interact with your medicine. What should I watch for while using this medicine? Visit your doctor for checks on your progress. This drug may make you feel generally unwell. This is not uncommon, as chemotherapy can affect healthy cells as well as cancer cells. Report any side effects. Continue your course of treatment even though you feel ill unless your doctor tells you to stop. In some cases, you may be given additional medicines to help with side effects. Follow all directions for their use. Call your doctor or health care professional for advice if you get a fever, chills or sore throat, or other symptoms of a cold or flu. Do not treat yourself. This drug decreases your body's ability to fight infections. Try to avoid being around people who are sick. This medicine may increase your risk to bruise or bleed. Call your doctor or health care professional if you notice any unusual bleeding. Be careful brushing and flossing your teeth or using a toothpick because you may get an infection or bleed more easily. If you  have any dental work done, tell your dentist you are receiving this medicine. Avoid taking products that contain aspirin, acetaminophen, ibuprofen, naproxen, or ketoprofen unless instructed by your doctor. These medicines may hide a fever. Do not become pregnant while taking this medicine. Women should inform their doctor if they wish to become pregnant or think they might be pregnant. There is a potential for serious side effects to an unborn child. Talk to your health care professional or pharmacist for more information. Do not breast-feed an infant while taking this medicine. Men should inform their doctor if they wish to father a child. This medicine may lower sperm counts. Do not treat diarrhea with over the counter products. Contact your doctor if you have diarrhea that lasts more than 2 days or if it is severe and watery. This medicine can make you more sensitive to the sun. Keep out of the sun. If you cannot avoid being in the sun, wear protective clothing and use sunscreen. Do not use sun lamps or tanning beds/booths. What side effects may I notice from receiving this medicine? Side effects that you should report to your doctor or health care professional as soon as possible: -allergic reactions like skin rash, itching or hives, swelling of the face, lips, or tongue -low blood counts - this medicine may decrease the number of white blood cells, red blood cells and platelets. You may be at increased risk for infections and bleeding. -signs of infection -  fever or chills, cough, sore throat, pain or difficulty passing urine -signs of decreased platelets or bleeding - bruising, pinpoint red spots on the skin, black, tarry stools, blood in the urine -signs of decreased red blood cells - unusually weak or tired, fainting spells, lightheadedness -breathing problems -changes in vision -chest pain -mouth sores -nausea and vomiting -pain, swelling, redness at site where injected -pain, tingling,  numbness in the hands or feet -redness, swelling, or sores on hands or feet -stomach pain -unusual bleeding Side effects that usually do not require medical attention (report to your doctor or health care professional if they continue or are bothersome): -changes in finger or toe nails -diarrhea -dry or itchy skin -hair loss -headache -loss of appetite -sensitivity of eyes to the light -stomach upset -unusually teary eyes This list may not describe all possible side effects. Call your doctor for medical advice about side effects. You may report side effects to FDA at 1-800-FDA-1088. Where should I keep my medicine? This drug is given in a hospital or clinic and will not be stored at home. NOTE: This sheet is a summary. It may not cover all possible information. If you have questions about this medicine, talk to your doctor, pharmacist, or health care provider.  2014, [employer] Standard. (2007-11-24 13:53:16) Leucovorin injection What is this medicine? LEUCOVORIN (loo koe VOR in) is used to prevent or treat the harmful effects of some medicines. This medicine is used to treat anemia caused by a low amount of folic acid in the body. It is also used with 5-fluorouracil (5-FU) to treat colon cancer. This medicine may be used for other purposes; ask your health care provider or pharmacist if you have questions. What should I tell my health care provider before I take this medicine? They need to know if you have any of these conditions: -anemia from low levels of vitamin B-12 in the blood -an unusual or allergic reaction to leucovorin, folic acid, other medicines, foods, dyes, or preservatives -pregnant or trying to get pregnant -breast-feeding How should I use this medicine? This medicine is for injection into a muscle or into a vein. It is given by a health care professional in a hospital or clinic setting. Talk to your pediatrician regarding the use of this medicine in children.  Special care may be needed. Overdosage: If you think you have taken too much of this medicine contact a poison control center or emergency room at once. NOTE: This medicine is only for you. Do not share this medicine with others. What if I miss a dose? This does not apply. What may interact with this medicine? -capecitabine -fluorouracil -phenobarbital -phenytoin -primidone -trimethoprim-sulfamethoxazole This list may not describe all possible interactions. Give your health care provider a list of all the medicines, herbs, non-prescription drugs, or dietary supplements you use. Also tell them if you smoke, drink alcohol, or use illegal drugs. Some items may interact with your medicine. What should I watch for while using this medicine? Your condition will be monitored carefully while you are receiving this medicine. This medicine may increase the side effects of 5-fluorouracil, 5-FU. Tell your doctor or health care professional if you have diarrhea or mouth sores that do not get better or that get worse. What side effects may I notice from receiving this medicine? Side effects that you should report to your doctor or health care professional as soon as possible: -allergic reactions like skin rash, itching or hives, swelling of the face, lips, or tongue -breathing problems -  fever, infection -mouth sores -unusual bleeding or bruising -unusually weak or tired Side effects that usually do not require medical attention (report to your doctor or health care professional if they continue or are bothersome): -constipation or diarrhea -loss of appetite -nausea, vomiting This list may not describe all possible side effects. Call your doctor for medical advice about side effects. You may report side effects to FDA at 1-800-FDA-1088. Where should I keep my medicine? This drug is given in a hospital or clinic and will not be stored at home. NOTE: This sheet is a summary. It may not cover all  possible information. If you have questions about this medicine, talk to your doctor, pharmacist, or health care provider.  2014, [employer] Standard. (2008-01-25 16:50:29) Bevacizumab injection What is this medicine? BEVACIZUMAB (be va SIZ yoo mab) is a chemotherapy drug. It targets a protein found in many cancer cell types, and halts cancer growth. This drug treats many cancers including non-small cell lung cancer, and colon or rectal cancer. It is usually given with other chemotherapy drugs. This medicine may be used for other purposes; ask your health care provider or pharmacist if you have questions. COMMON BRAND NAME(S): Avastin What should I tell my health care provider before I take this medicine? They need to know if you have any of these conditions: -blood clots -heart disease, including heart failure, heart attack, or chest pain (angina) -high blood pressure -infection (especially a virus infection such as chickenpox, cold sores, or herpes) -kidney disease -lung disease -prior chemotherapy with doxorubicin, daunorubicin, epirubicin, or other anthracycline type chemotherapy agents -recent or ongoing radiation therapy -recent surgery -stroke -an unusual or allergic reaction to bevacizumab, hamster proteins, mouse proteins, other medicines, foods, dyes, or preservatives -pregnant or trying to get pregnant -breast-feeding How should I use this medicine? This medicine is for infusion into a vein. It is given by a health care professional in a hospital or clinic setting. Talk to your pediatrician regarding the use of this medicine in children. Special care may be needed. Overdosage: If you think you have taken too much of this medicine contact a poison control center or emergency room at once. NOTE: This medicine is only for you. Do not share this medicine with others. What if I miss a dose? It is important not to miss your dose. Call your doctor or health care professional if  you are unable to keep an appointment. What may interact with this medicine? Interactions are not expected. This list may not describe all possible interactions. Give your health care provider a list of all the medicines, herbs, non-prescription drugs, or dietary supplements you use. Also tell them if you smoke, drink alcohol, or use illegal drugs. Some items may interact with your medicine. What should I watch for while using this medicine? Your condition will be monitored carefully while you are receiving this medicine. You will need important blood work and urine testing done while you are taking this medicine. During your treatment, let your health care professional know if you have any unusual symptoms, such as difficulty breathing. This medicine may rarely cause 'gastrointestinal perforation' (holes in the stomach, intestines or colon), a serious side effect requiring surgery to repair. This medicine should be started at least 28 days following major surgery and the site of the surgery should be totally healed. Check with your doctor before scheduling dental work or surgery while you are receiving this treatment. Talk to your doctor if you have recently had surgery or if you  have a wound that has not healed. Do not become pregnant while taking this medicine. Women should inform their doctor if they wish to become pregnant or think they might be pregnant. There is a potential for serious side effects to an unborn child. Talk to your health care professional or pharmacist for more information. Do not breast-feed an infant while taking this medicine. This medicine has caused ovarian failure in some women. This medicine may interfere with the ability to have a child. You should talk to your doctor or health care professional if you are concerned about your fertility. What side effects may I notice from receiving this medicine? Side effects that you should report to your doctor or health care  professional as soon as possible: -allergic reactions like skin rash, itching or hives, swelling of the face, lips, or tongue -signs of infection - fever or chills, cough, sore throat, pain or trouble passing urine -signs of decreased platelets or bleeding - bruising, pinpoint red spots on the skin, black, tarry stools, nosebleeds, blood in the urine -breathing problems -changes in vision -chest pain -confusion -jaw pain, especially after dental work -mouth sores -seizures -severe abdominal pain -severe headache -sudden numbness or weakness of the face, arm or leg -swelling of legs or ankles -symptoms of a stroke: change in mental awareness, inability to talk or move one side of the body (especially in patients with lung cancer) -trouble passing urine or change in the amount of urine -trouble speaking or understanding -trouble walking, dizziness, loss of balance or coordination Side effects that usually do not require medical attention (report to your doctor or health care professional if they continue or are bothersome): -constipation -diarrhea -dry skin -headache -loss of appetite -nausea, vomiting This list may not describe all possible side effects. Call your doctor for medical advice about side effects. You may report side effects to FDA at 1-800-FDA-1088. Where should I keep my medicine? This drug is given in a hospital or clinic and will not be stored at home. NOTE: This sheet is a summary. It may not cover all possible information. If you have questions about this medicine, talk to your doctor, pharmacist, or health care provider.  2014, [employer] Standard. (2010-06-21 16:25:37)

## 2013-08-08 ENCOUNTER — Telehealth (INDEPENDENT_AMBULATORY_CARE_PROVIDER_SITE_OTHER): Payer: Self-pay

## 2013-08-08 ENCOUNTER — Other Ambulatory Visit (INDEPENDENT_AMBULATORY_CARE_PROVIDER_SITE_OTHER): Payer: Self-pay | Admitting: General Surgery

## 2013-08-08 ENCOUNTER — Encounter (INDEPENDENT_AMBULATORY_CARE_PROVIDER_SITE_OTHER): Payer: Self-pay | Admitting: General Surgery

## 2013-08-08 MED ORDER — OXYCODONE-ACETAMINOPHEN 5-325 MG PO TABS: 1.0000 | ORAL_TABLET | ORAL | Status: DC | PRN

## 2013-08-08 NOTE — Telephone Encounter (Signed)
Called patient to let her know that she has a written Rx for Percocet 5/325 1 tab by month every 4 hours as needed #30 with no refill

## 2013-08-08 NOTE — Telephone Encounter (Signed)
Patient walked in this morning for refill on Percocet.  I advised Dr Grandville Silos is in the OR and he will be here this afternoon.  We can call her when the prescription is ready

## 2013-08-10 ENCOUNTER — Encounter (HOSPITAL_COMMUNITY): Payer: Self-pay | Admitting: Pharmacy Technician

## 2013-08-10 ENCOUNTER — Ambulatory Visit (INDEPENDENT_AMBULATORY_CARE_PROVIDER_SITE_OTHER): Payer: Medicaid Other | Admitting: Family Medicine

## 2013-08-10 ENCOUNTER — Encounter: Payer: Self-pay | Admitting: Family Medicine

## 2013-08-10 VITALS — BP 117/77 | HR 55 | Wt 162.0 lb

## 2013-08-10 DIAGNOSIS — R1013 Epigastric pain: Secondary | ICD-10-CM

## 2013-08-10 DIAGNOSIS — E109 Type 1 diabetes mellitus without complications: Secondary | ICD-10-CM

## 2013-08-10 MED ORDER — OXYCODONE-ACETAMINOPHEN 10-325 MG PO TABS: 1.0000 | ORAL_TABLET | Freq: Three times a day (TID) | ORAL | Status: DC | PRN

## 2013-08-10 NOTE — Patient Instructions (Signed)
Ms. Labate,   It was good to see you today. I am glad that you came in. Please read below.   1. Keep taking the miralax and stool softener as long are you are taking pain medications.   2. Pain Medications - You can take percocet as needed. However, it is more important for you to be on suboxone, because if you start using heroine again, then you will not be able to get your cancer treatment. Please let me know if you have problems with cravings. Also, please go see Dr. Brayton El.   3. Diabetes - Please continue the 25 units of Lantus each morning and sliding scale with meals. Do not snack at night. If you do, please take 2 units of insulin.   Sincerely,   Dr. Maricela Bo

## 2013-08-10 NOTE — Progress Notes (Signed)
   Subjective:    Patient ID: Monica [name], female    DOB: 16-Oct-1960, 53 y.o.   MRN: [id number]  HPI  53 year old F with a recent diagnosis of metastatic adenocarcinoma of the colon (T4b, N2b, M1) who presents for follow up. She is status post resection with bowel reanastomosis and has planned chemotherapy.   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; prior to diagnosis of colon cancer and resection she was on Suboxone therapy but stopped after surgery; she does not increased cravings for heroine use since stopping suboxone, but denies using; her next appointment with psychiatrist is next week and uncertain whether she will attend or wait until after chemo therapy has started; pt also notes severe pain in her left knee that is 3 weeks s/p resection of subcutaneous nodule   Diabetes - CBG > 300, this morning was 144, taking Lantus 25 U QHS and then taking sliding scale 3-4 units per meal,   Review of Systems See HPI    Objective:   Physical Exam BP 117/77  Pulse 55  Wt 162 lb (73.483 kg)  Gen: middle age female, much more well appearing than current diagnosis of metastatic colon cancer implies  Abd: well healing incision, mild distension, mild tenderness Left knee: tenderness but no edema or bruising, well healed incision       Assessment & Plan:   Pain and Opioid Dependency - pt counseled extensively on the need to balance pain control, sedation, and at all costs avoiding relapse into heroine use; pt confident that she will not use injection drugs; I encouraged her to restart suboxone next week after placement of port; Given Perocet prescription since continued pain and possible opioid withdrawal side effects

## 2013-08-11 ENCOUNTER — Encounter (INDEPENDENT_AMBULATORY_CARE_PROVIDER_SITE_OTHER): Payer: Self-pay | Admitting: General Surgery

## 2013-08-11 ENCOUNTER — Encounter (INDEPENDENT_AMBULATORY_CARE_PROVIDER_SITE_OTHER): Payer: Medicaid Other | Admitting: General Surgery

## 2013-08-11 ENCOUNTER — Encounter (HOSPITAL_COMMUNITY): Payer: Self-pay | Admitting: *Deleted

## 2013-08-11 ENCOUNTER — Ambulatory Visit (INDEPENDENT_AMBULATORY_CARE_PROVIDER_SITE_OTHER): Payer: Medicaid Other | Admitting: General Surgery

## 2013-08-11 VITALS — BP 110/78 | HR 78 | Temp 97.4°F | Ht 65.0 in | Wt 160.0 lb

## 2013-08-11 DIAGNOSIS — C189 Malignant neoplasm of colon, unspecified: Secondary | ICD-10-CM

## 2013-08-11 MED ORDER — CEFAZOLIN SODIUM-DEXTROSE 2-3 GM-% IV SOLR
2.0000 g | INTRAVENOUS | Status: AC
  Administered 2013-08-12: 2 g via INTRAVENOUS
  Filled 2013-08-11: qty 50

## 2013-08-11 NOTE — Progress Notes (Signed)
Subjective:     Patient ID: Monica [name], female   DOB: 12/09/1960, 52 y.o.   MRN: [id number]  HPI Patient status post Cholecystectomy,right colectomy, liver biopsy, omental mass resection, subcutaneous mass resection for stage IV colon cancer. She is scheduled for Port-A-Cath insertion tomorrow. She has been eating. She 1. Constipation resolved by eating prunes. Otherwise she is still taking oxycodone/acetaminophen for pain. Despite this she does complain of some withdrawal symptoms. 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  Review of Systems     Objective:   Physical Exam  Constitutional: She appears well-nourished. No distress.  Eyes: EOM are normal. Pupils are equal, round, and reactive to light.  Cardiovascular: Normal rate and normal heart sounds.   Pulmonary/Chest: Effort normal. No respiratory distress. She has no wheezes.  Abdominal: Soft. She exhibits no distension. There is no tenderness. There is no rebound and no guarding.  Midline incision well-healed, no hernia, no tenderness, no palpable masses  Musculoskeletal: Normal range of motion.  Neurological: She is alert.  Psychiatric:  Depressed mood       Assessment:     Stage IV colon cancer, Status post cholecystectomy, right colectomy, section of omental mass, liver biopsy, resection of subcutaneous mass    Plan:     Port-A-Cath insertion tomorrow. Procedure, risks, and benefits were discussed again with the patient. She is agreeable.      

## 2013-08-11 NOTE — Patient Instructions (Signed)
Report as instructed at 9 AM for Port-A-Cath insertion tomorrow

## 2013-08-12 ENCOUNTER — Ambulatory Visit (HOSPITAL_COMMUNITY): Payer: Medicaid Other

## 2013-08-12 ENCOUNTER — Ambulatory Visit (HOSPITAL_COMMUNITY): Payer: Medicaid Other | Admitting: Anesthesiology

## 2013-08-12 ENCOUNTER — Encounter (HOSPITAL_COMMUNITY): Payer: Medicaid Other | Admitting: Anesthesiology

## 2013-08-12 ENCOUNTER — Encounter (HOSPITAL_COMMUNITY)
Admission: RE | Disposition: A | Discharge status: Home / Self Care | Payer: Self-pay | Source: Ambulatory Visit | Attending: General Surgery

## 2013-08-12 ENCOUNTER — Ambulatory Visit (HOSPITAL_COMMUNITY)
Admission: RE | Admit: 2013-08-12 | Discharge: 2013-08-12 | Disposition: A | Discharge status: Home / Self Care | Payer: Medicaid Other | Source: Ambulatory Visit | Attending: General Surgery | Admitting: General Surgery

## 2013-08-12 ENCOUNTER — Encounter (HOSPITAL_COMMUNITY): Payer: Self-pay | Admitting: *Deleted

## 2013-08-12 DIAGNOSIS — N289 Disorder of kidney and ureter, unspecified: Secondary | ICD-10-CM | POA: Insufficient documentation

## 2013-08-12 DIAGNOSIS — I10 Essential (primary) hypertension: Secondary | ICD-10-CM | POA: Insufficient documentation

## 2013-08-12 DIAGNOSIS — F17200 Nicotine dependence, unspecified, uncomplicated: Secondary | ICD-10-CM | POA: Insufficient documentation

## 2013-08-12 DIAGNOSIS — F411 Generalized anxiety disorder: Secondary | ICD-10-CM | POA: Insufficient documentation

## 2013-08-12 DIAGNOSIS — F319 Bipolar disorder, unspecified: Secondary | ICD-10-CM | POA: Insufficient documentation

## 2013-08-12 DIAGNOSIS — Z9049 Acquired absence of other specified parts of digestive tract: Secondary | ICD-10-CM | POA: Insufficient documentation

## 2013-08-12 DIAGNOSIS — C189 Malignant neoplasm of colon, unspecified: Secondary | ICD-10-CM | POA: Insufficient documentation

## 2013-08-12 DIAGNOSIS — E119 Type 2 diabetes mellitus without complications: Secondary | ICD-10-CM | POA: Insufficient documentation

## 2013-08-12 DIAGNOSIS — Z794 Long term (current) use of insulin: Secondary | ICD-10-CM | POA: Insufficient documentation

## 2013-08-12 DIAGNOSIS — R1013 Epigastric pain: Secondary | ICD-10-CM

## 2013-08-12 HISTORY — DX: Other specified postprocedural states: Z98.890

## 2013-08-12 HISTORY — DX: Adverse effect of unspecified anesthetic, initial encounter: T41.45XA

## 2013-08-12 HISTORY — DX: Nausea with vomiting, unspecified: R11.2

## 2013-08-12 HISTORY — DX: Anxiety disorder, unspecified: F41.9

## 2013-08-12 HISTORY — DX: Irritable bowel syndrome, unspecified: K58.9

## 2013-08-12 HISTORY — PX: PORTACATH PLACEMENT: SHX2246

## 2013-08-12 HISTORY — DX: Other complications of anesthesia, initial encounter: T88.59XA

## 2013-08-12 LAB — CBC
HCT: 35.9 % — ABNORMAL LOW (ref 36.0–46.0)
Hemoglobin: 11.9 g/dL — ABNORMAL LOW (ref 12.0–15.0)
MCH: 28.2 pg (ref 26.0–34.0)
MCHC: 33.1 g/dL (ref 30.0–36.0)
MCV: 85.1 fL (ref 78.0–100.0)
Platelets: 202 10*3/uL (ref 150–400)
RBC: 4.22 MIL/uL (ref 3.87–5.11)
RDW: 14.4 % (ref 11.5–15.5)
WBC: 4.4 10*3/uL (ref 4.0–10.5)

## 2013-08-12 LAB — GLUCOSE, CAPILLARY
GLUCOSE-CAPILLARY: 109 mg/dL — AB (ref 70–99)
Glucose-Capillary: 75 mg/dL (ref 70–99)
Glucose-Capillary: 97 mg/dL (ref 70–99)
Glucose-Capillary: 240 mg/dL — ABNORMAL HIGH (ref 70–99)

## 2013-08-12 LAB — COMPREHENSIVE METABOLIC PANEL
ALT: 23 U/L (ref 0–35)
AST: 22 U/L (ref 0–37)
Albumin: 2.9 g/dL — ABNORMAL LOW (ref 3.5–5.2)
Alkaline Phosphatase: 199 U/L — ABNORMAL HIGH (ref 39–117)
BUN: 13 mg/dL (ref 6–23)
CALCIUM: 8.7 mg/dL (ref 8.4–10.5)
CO2: 27 meq/L (ref 19–32)
CREATININE: 1.14 mg/dL — AB (ref 0.50–1.10)
Chloride: 105 mEq/L (ref 96–112)
GFR, EST AFRICAN AMERICAN: 63 mL/min — AB (ref >90)
GFR, EST NON AFRICAN AMERICAN: 55 mL/min — AB (ref >90)
Glucose, Bld: 95 mg/dL (ref 70–99)
Potassium: 4.2 mEq/L (ref 3.7–5.3)
Sodium: 143 mEq/L (ref 137–147)
TOTAL PROTEIN: 6.4 g/dL (ref 6.0–8.3)
Total Bilirubin: 0.2 mg/dL — ABNORMAL LOW (ref 0.3–1.2)

## 2013-08-12 SURGERY — INSERTION, TUNNELED CENTRAL VENOUS DEVICE, WITH PORT
Anesthesia: General | Site: Chest | Laterality: Left

## 2013-08-12 MED ORDER — OXYCODONE-ACETAMINOPHEN 5-325 MG PO TABS
ORAL_TABLET | ORAL | Status: AC
  Filled 2013-08-12: qty 1

## 2013-08-12 MED ORDER — HEPARIN SOD (PORK) LOCK FLUSH 100 UNIT/ML IV SOLN
INTRAVENOUS | Status: DC | PRN
  Administered 2013-08-12: 500 [IU] via INTRAVENOUS

## 2013-08-12 MED ORDER — 0.9 % SODIUM CHLORIDE (POUR BTL) OPTIME
TOPICAL | Status: DC | PRN
  Administered 2013-08-12: 1000 mL

## 2013-08-12 MED ORDER — MIDAZOLAM HCL 5 MG/5ML IJ SOLN
INTRAMUSCULAR | Status: DC | PRN
  Administered 2013-08-12 (×4): 1 mg via INTRAVENOUS

## 2013-08-12 MED ORDER — LACTATED RINGERS IV SOLN
INTRAVENOUS | Status: DC
  Administered 2013-08-12: 10:00:00 via INTRAVENOUS

## 2013-08-12 MED ORDER — LIDOCAINE HCL 1 % IJ SOLN
INTRAMUSCULAR | Status: DC | PRN
  Administered 2013-08-12: 40 mg via INTRADERMAL

## 2013-08-12 MED ORDER — HEPARIN SODIUM (PORCINE) 5000 UNIT/ML IJ SOLN
INTRAMUSCULAR | Status: DC | PRN
  Administered 2013-08-12: 13:00:00

## 2013-08-12 MED ORDER — OXYCODONE-ACETAMINOPHEN 5-325 MG PO TABS
2.0000 | ORAL_TABLET | Freq: Once | ORAL | Status: AC
  Administered 2013-08-12: 2 via ORAL

## 2013-08-12 MED ORDER — SODIUM BICARBONATE 4 % IV SOLN
INTRAVENOUS | Status: AC
  Filled 2013-08-12: qty 5

## 2013-08-12 MED ORDER — FENTANYL CITRATE 0.05 MG/ML IJ SOLN: 25.0000 ug | INTRAMUSCULAR | Status: DC | PRN

## 2013-08-12 MED ORDER — BUPIVACAINE-EPINEPHRINE (PF) 0.25% -1:200000 IJ SOLN
INTRAMUSCULAR | Status: AC
  Filled 2013-08-12: qty 30

## 2013-08-12 MED ORDER — OXYCODONE HCL 5 MG PO TABS: 5.0000 mg | ORAL_TABLET | Freq: Once | ORAL | Status: DC | PRN

## 2013-08-12 MED ORDER — METOCLOPRAMIDE HCL 5 MG/ML IJ SOLN
10.0000 mg | Freq: Once | INTRAMUSCULAR | Status: DC | PRN
Start: 2013-08-12 — End: 2013-08-12

## 2013-08-12 MED ORDER — HEPARIN SOD (PORK) LOCK FLUSH 100 UNIT/ML IV SOLN
INTRAVENOUS | Status: AC
  Filled 2013-08-12: qty 5

## 2013-08-12 MED ORDER — BUPIVACAINE HCL (PF) 0.25 % IJ SOLN
INTRAMUSCULAR | Status: DC | PRN
  Administered 2013-08-12: 1 mL

## 2013-08-12 MED ORDER — DEXTROSE-NACL 5-0.9 % IV SOLN
INTRAVENOUS | Status: DC
  Administered 2013-08-12: 20 mL/h via INTRAVENOUS

## 2013-08-12 MED ORDER — SODIUM CHLORIDE 0.9 % IV SOLN
INTRAVENOUS | Status: DC | PRN
  Administered 2013-08-12: 13:00:00 via INTRAVENOUS

## 2013-08-12 MED ORDER — LIDOCAINE HCL (PF) 1 % IJ SOLN
INTRAMUSCULAR | Status: AC
  Filled 2013-08-12: qty 30

## 2013-08-12 MED ORDER — OXYCODONE HCL 5 MG/5ML PO SOLN: 5.0000 mg | Freq: Once | ORAL | Status: DC | PRN

## 2013-08-12 MED ORDER — ONDANSETRON HCL 4 MG/2ML IJ SOLN
INTRAMUSCULAR | Status: DC | PRN
  Administered 2013-08-12: 4 mg via INTRAVENOUS

## 2013-08-12 MED ORDER — ARTIFICIAL TEARS OP OINT
TOPICAL_OINTMENT | OPHTHALMIC | Status: DC | PRN
  Administered 2013-08-12: 1 via OPHTHALMIC

## 2013-08-12 MED ORDER — PROPOFOL 10 MG/ML IV BOLUS
INTRAVENOUS | Status: DC | PRN
  Administered 2013-08-12: 250 mg via INTRAVENOUS

## 2013-08-12 MED ORDER — CHLORHEXIDINE GLUCONATE 4 % EX LIQD: 1.0000 "application " | Freq: Once | CUTANEOUS | Status: DC

## 2013-08-12 MED ORDER — FENTANYL CITRATE 0.05 MG/ML IJ SOLN
INTRAMUSCULAR | Status: DC | PRN
  Administered 2013-08-12 (×5): 50 ug via INTRAVENOUS

## 2013-08-12 MED ORDER — OXYCODONE-ACETAMINOPHEN 10-325 MG PO TABS: 1.0000 | ORAL_TABLET | Freq: Three times a day (TID) | ORAL | Status: DC | PRN

## 2013-08-12 SURGICAL SUPPLY — 57 items
ADH SKN CLS APL DERMABOND .7 (GAUZE/BANDAGES/DRESSINGS) ×1
BAG DECANTER FOR FLEXI CONT (MISCELLANEOUS) ×3 IMPLANT
BLADE SURG 11 STRL SS (BLADE) ×3 IMPLANT
BLADE SURG 15 STRL LF DISP TIS (BLADE) ×1 IMPLANT
BLADE SURG 15 STRL SS (BLADE) ×3
BLADE SURG ROTATE 9660 (MISCELLANEOUS) IMPLANT
CHLORAPREP W/TINT 26ML (MISCELLANEOUS) ×3 IMPLANT
COVER SURGICAL LIGHT HANDLE (MISCELLANEOUS) ×3 IMPLANT
CRADLE DONUT ADULT HEAD (MISCELLANEOUS) ×3 IMPLANT
DECANTER SPIKE VIAL GLASS SM (MISCELLANEOUS) ×3 IMPLANT
DERMABOND ADVANCED (GAUZE/BANDAGES/DRESSINGS) ×2
DERMABOND ADVANCED .7 DNX12 (GAUZE/BANDAGES/DRESSINGS) ×1 IMPLANT
DRAPE C-ARM 42X72 X-RAY (DRAPES) ×5 IMPLANT
DRAPE LAPAROTOMY T 102X78X121 (DRAPES) ×1 IMPLANT
DRAPE UTILITY 15X26 W/TAPE STR (DRAPE) ×6 IMPLANT
ELECT CAUTERY BLADE 6.4 (BLADE) ×3 IMPLANT
ELECT REM PT RETURN 9FT ADLT (ELECTROSURGICAL) ×3
ELECTRODE REM PT RTRN 9FT ADLT (ELECTROSURGICAL) ×1 IMPLANT
GAUZE SPONGE 4X4 16PLY XRAY LF (GAUZE/BANDAGES/DRESSINGS) ×3 IMPLANT
GLOVE BIO SURGEON STRL SZ7.5 (GLOVE) ×2 IMPLANT
GLOVE BIO SURGEON STRL SZ8 (GLOVE) ×3 IMPLANT
GLOVE BIOGEL PI IND STRL 7.0 (GLOVE) IMPLANT
GLOVE BIOGEL PI IND STRL 7.5 (GLOVE) IMPLANT
GLOVE BIOGEL PI IND STRL 8 (GLOVE) ×1 IMPLANT
GLOVE BIOGEL PI INDICATOR 7.0 (GLOVE) ×2
GLOVE BIOGEL PI INDICATOR 7.5 (GLOVE) ×6
GLOVE BIOGEL PI INDICATOR 8 (GLOVE) ×2
GLOVE SURG SS PI 7.0 STRL IVOR (GLOVE) ×2 IMPLANT
GOWN STRL NON-REIN LRG LVL3 (GOWN DISPOSABLE) ×7 IMPLANT
GOWN STRL REIN XL XLG (GOWN DISPOSABLE) ×3 IMPLANT
INTRODUCER 13FR (MISCELLANEOUS) IMPLANT
INTRODUCER COOK 11FR (CATHETERS) IMPLANT
KIT BASIN OR (CUSTOM PROCEDURE TRAY) ×3 IMPLANT
KIT PORT POWER 8FR ISP CVUE (Catheter) ×2 IMPLANT
KIT PORT POWER 9.6FR MRI PREA (Catheter) IMPLANT
KIT PORT POWER ISP 8FR (Catheter) IMPLANT
KIT POWER CATH 8FR (Catheter) IMPLANT
KIT ROOM TURNOVER OR (KITS) ×3 IMPLANT
NEEDLE 22X1 1/2 (OR ONLY) (NEEDLE) ×3 IMPLANT
NS IRRIG 1000ML POUR BTL (IV SOLUTION) ×3 IMPLANT
PACK SURGICAL SETUP 50X90 (CUSTOM PROCEDURE TRAY) ×3 IMPLANT
PAD ARMBOARD 7.5X6 YLW CONV (MISCELLANEOUS) ×4 IMPLANT
PENCIL BUTTON HOLSTER BLD 10FT (ELECTRODE) ×3 IMPLANT
SET INTRODUCER 12FR PACEMAKER (SHEATH) IMPLANT
SET SHEATH INTRODUCER 10FR (MISCELLANEOUS) IMPLANT
SHEATH COOK PEEL AWAY SET 9F (SHEATH) IMPLANT
SPONGE LAP 18X18 X RAY DECT (DISPOSABLE) ×3 IMPLANT
SUT PROLENE 2 0 CT2 30 (SUTURE) ×3 IMPLANT
SUT VIC AB 3-0 SH 27 (SUTURE) ×6
SUT VIC AB 3-0 SH 27X BRD (SUTURE) ×2 IMPLANT
SUT VICRYL 4-0 PS2 18IN ABS (SUTURE) ×3 IMPLANT
SYR 20ML ECCENTRIC (SYRINGE) ×6 IMPLANT
SYR 5ML LUER SLIP (SYRINGE) ×3 IMPLANT
SYR CONTROL 10ML LL (SYRINGE) ×3 IMPLANT
TOWEL OR 17X24 6PK STRL BLUE (TOWEL DISPOSABLE) ×1 IMPLANT
TOWEL OR 17X26 10 PK STRL BLUE (TOWEL DISPOSABLE) ×3 IMPLANT
WATER STERILE IRR 1000ML POUR (IV SOLUTION) IMPLANT

## 2013-08-12 NOTE — Discharge Instructions (Signed)

## 2013-08-12 NOTE — Op Note (Signed)
08/12/2013  1:50 PM  PATIENT:  Monica [name]  53 y.o. female  PRE-OPERATIVE DIAGNOSIS:  COLON CANCER  POST-OPERATIVE DIAGNOSIS:  COLON CANCER  PROCEDURE:  Procedure(s): INSERTION PORT-A-CATH  SURGEON:  Surgeon(s): Zenovia Jarred, MD  PHYSICIAN ASSISTANT:   ASSISTANTS: none   ANESTHESIA:   local and general  EBL:  Total I/O In: 700 [I.V.:700] Out: -   BLOOD ADMINISTERED:none  DRAINS: none   SPECIMEN:  No Specimen  DISPOSITION OF SPECIMEN:  N/A  COUNTS:  YES  DICTATION: .Dragon Dictation Patient's recent diagnosis of stage IV colon cancer. She is status post right colectomy. She presents for Port-A-Cath placement so she may begin adjuvant chemotherapy. She was identified in the preop holding area. Informed consent was obtained. She received intravenous antibiotics. She was brought to the operating room and general anesthesia with laryngeal mask airway was administered by the anesthesia staff. Her upper chest in both necks were prepped and draped in sterile fashion. We did time out procedure. Local anesthetic was injected beneath the left clavicle.Left subclavian vein was entered with one stick without difficulty. Guidewire was passed and confirmed in this appeared vena cava by fluoroscopy. A pocket area on the left chest as well as a subcutaneous tunnel up towards the wire exit was anesthetized with local. Incision was made in the chest and a pocket was created with gentle dissection. Hemostasis was obtained with cautery. Time or was used to bring the catheter from the chest wall about next to the wire. The introducer sheath was inserted over the wire and placement was confirmed on fluoroscopy. Tear-away sheath was removed and the catheter was fed down into the superior vena cava. Catheter length was adjusted to just above the right atrium in the superior vena cava. Catheter was trimmed at the chest wall an attached to the port. Port was placed within the pocket and location  was again checked with fluoroscopy. Port appeared in good position and the tip of the catheter was in the superior vena cava. Port was flushed with dilute followed by concentrated heparin via Charisse March needle. It functioned well. Port was secured to subcutaneous tissues with 2-0 Prolene sutures. Area was irrigated and hemostasis was obtained. Subcutaneous tissues were closed with running 3-0 Vicryl. Skin was closed with running 4 Vicryl subcuticular at the chest wall site. Subcuticular 4-0 Vicryl closed the small wire access site. Dermabond was placed on both. All counts were correct. Patient tolerated the procedure well without apparent complication and was taken recovery room in stable condition. Chest x-ray is pending.  PATIENT DISPOSITION:  PACU - hemodynamically stable.   Delay start of Pharmacological VTE agent (>24hrs) due to surgical blood loss or risk of bleeding:  no  Georganna Skeans, MD, MPH, FACS Pager: [phone]  1/9/20151:50 PM

## 2013-08-12 NOTE — Interval H&P Note (Signed)
History and Physical Interval Note:please see recent H&P as well.  08/12/2013 12:05 PM  Monica [name]  has presented today for surgery, with the diagnosis of colon cancer   The various methods of treatment have been discussed with the patient and family. After consideration of risks, benefits and other options for treatment, the patient has consented to  Procedure(s): INSERTION PORT-A-CATH (N/A) as a surgical intervention .  The patient's history has been reviewed, patient re-examined, no change in status, stable for surgery.  I have reviewed the patient's chart and labs.  Questions were answered to the patient's satisfaction.     [name] Grewe E

## 2013-08-12 NOTE — Anesthesia Postprocedure Evaluation (Signed)
  Anesthesia Post-op Note  Patient: Monica [name]  Procedure(s) Performed: Procedure(s): INSERTION PORT-A-CATH (Left)  Patient Location: PACU  Anesthesia Type:General  Level of Consciousness: awake, alert  and oriented  Airway and Oxygen Therapy: Patient Spontanous Breathing  Post-op Pain: mild  Post-op Assessment: Post-op Vital signs reviewed, Patient's Cardiovascular Status Stable, Respiratory Function Stable, Patent Airway and No signs of Nausea or vomiting  Post-op Vital Signs: Reviewed and stable  Complications: No apparent anesthesia complications

## 2013-08-12 NOTE — Progress Notes (Signed)
Dr.Frederick notified of blood sugar 75.Orders received to hang D5 NS

## 2013-08-12 NOTE — Anesthesia Procedure Notes (Signed)
Procedure Name: LMA Insertion Date/Time: 08/12/2013 12:52 PM Performed by: Ned Grace Pre-anesthesia Checklist: Patient identified, Patient being monitored, Emergency Drugs available, Timeout performed and Suction available Patient Re-evaluated:Patient Re-evaluated prior to inductionOxygen Delivery Method: Circle system utilized Preoxygenation: Pre-oxygenation with 100% oxygen Intubation Type: IV induction Ventilation: Mask ventilation without difficulty LMA: LMA inserted LMA Size: 4.0 Number of attempts: 1 Placement Confirmation: breath sounds checked- equal and bilateral and positive ETCO2 Tube secured with: Tape Dental Injury: Teeth and Oropharynx as per pre-operative assessment  Comments: Smooth IV induction by Dr Albertina Parr; easy atraumatic LMA insertion by Bethann Humble CRNA.

## 2013-08-12 NOTE — Anesthesia Preprocedure Evaluation (Signed)
Anesthesia Evaluation  Patient identified by MRN, date of birth, ID band Patient awake    Reviewed: Allergy & Precautions, H&P , NPO status , Patient's Chart, lab work & pertinent test results, reviewed documented beta blocker date and time   History of Anesthesia Complications (+) PONV and history of anesthetic complications  Airway Mallampati: II TM Distance: >3 FB Neck ROM: full    Dental   Pulmonary Current Smoker,  breath sounds clear to auscultation        Cardiovascular hypertension, On Medications Rhythm:regular     Neuro/Psych PSYCHIATRIC DISORDERS Anxiety Depression Bipolar Disorder negative neurological ROS     GI/Hepatic negative GI ROS, Neg liver ROS, (+)     substance abuse   , Hepatitis -, C  Endo/Other  diabetes, Insulin Dependent  Renal/GU Renal InsufficiencyRenal disease  negative genitourinary   Musculoskeletal   Abdominal   Peds  Hematology  (+) Blood dyscrasia, anemia ,   Anesthesia Other Findings See surgeon's H&P   Reproductive/Obstetrics negative OB ROS                           Anesthesia Physical Anesthesia Plan  ASA: III  Anesthesia Plan: General   Post-op Pain Management:    Induction: Intravenous  Airway Management Planned: LMA  Additional Equipment:   Intra-op Plan:   Post-operative Plan:   Informed Consent: I have reviewed the patients History and Physical, chart, labs and discussed the procedure including the risks, benefits and alternatives for the proposed anesthesia with the patient or authorized representative who has indicated his/her understanding and acceptance.   Dental Advisory Given  Plan Discussed with: CRNA and Surgeon  Anesthesia Plan Comments:         Anesthesia Quick Evaluation

## 2013-08-12 NOTE — Transfer of Care (Signed)
Immediate Anesthesia Transfer of Care Note  Patient: Monica [name]  Procedure(s) Performed: Procedure(s): INSERTION PORT-A-CATH (Left)  Patient Location: PACU  Anesthesia Type:General  Level of Consciousness: awake, sedated and patient cooperative  Airway & Oxygen Therapy: Patient Spontanous Breathing  Post-op Assessment: Report given to PACU RN and Post -op Vital signs reviewed and stable  Post vital signs: Reviewed and stable  Complications: No apparent anesthesia complications

## 2013-08-12 NOTE — H&P (View-Only) (Signed)
Subjective:     Patient ID: Monica [name], female   DOB: [birth year], 53 y.o.   MRN: [id number]  HPI Patient status post Cholecystectomy,right colectomy, liver biopsy, omental mass resection, subcutaneous mass resection for stage IV colon cancer. She is scheduled for Port-A-Cath insertion tomorrow. She has been eating. She 1. Constipation resolved by eating prunes. Otherwise she is still taking oxycodone/acetaminophen for pain. Despite this she does complain of some withdrawal symptoms. 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  Review of Systems     Objective:   Physical Exam  Constitutional: She appears well-nourished. No distress.  Eyes: EOM are normal. Pupils are equal, round, and reactive to light.  Cardiovascular: Normal rate and normal heart sounds.   Pulmonary/Chest: Effort normal. No respiratory distress. She has no wheezes.  Abdominal: Soft. She exhibits no distension. There is no tenderness. There is no rebound and no guarding.  Midline incision well-healed, no hernia, no tenderness, no palpable masses  Musculoskeletal: Normal range of motion.  Neurological: She is alert.  Psychiatric:  Depressed mood       Assessment:     Stage IV colon cancer, Status post cholecystectomy, right colectomy, section of omental mass, liver biopsy, resection of subcutaneous mass    Plan:     Port-A-Cath insertion tomorrow. Procedure, risks, and benefits were discussed again with the patient. She is agreeable.

## 2013-08-13 ENCOUNTER — Encounter: Payer: Self-pay | Admitting: Family Medicine

## 2013-08-13 NOTE — Assessment & Plan Note (Signed)
A: pt always have difficult to control CBG's due to erratic eating patterns and likely worsened by chronic process P: cont Lantus 25 units q AM and sensitive sliding scale

## 2013-08-15 ENCOUNTER — Encounter (HOSPITAL_COMMUNITY): Payer: Self-pay | Admitting: General Surgery

## 2013-08-16 ENCOUNTER — Encounter: Payer: Self-pay | Admitting: *Deleted

## 2013-08-16 ENCOUNTER — Other Ambulatory Visit: Payer: Medicaid Other

## 2013-08-19 ENCOUNTER — Ambulatory Visit (HOSPITAL_COMMUNITY): Payer: Self-pay

## 2013-08-19 ENCOUNTER — Encounter (HOSPITAL_COMMUNITY): Payer: Self-pay

## 2013-08-23 ENCOUNTER — Other Ambulatory Visit (HOSPITAL_COMMUNITY): Payer: Self-pay | Admitting: Family Medicine

## 2013-08-24 ENCOUNTER — Other Ambulatory Visit: Payer: Self-pay | Admitting: Family Medicine

## 2013-08-29 ENCOUNTER — Ambulatory Visit (HOSPITAL_COMMUNITY)
Admission: RE | Admit: 2013-08-29 | Discharge: 2013-08-29 | Disposition: A | Discharge status: Home / Self Care | Payer: Medicaid Other | Source: Ambulatory Visit | Attending: Hematology and Oncology | Admitting: Hematology and Oncology

## 2013-08-29 ENCOUNTER — Encounter (HOSPITAL_COMMUNITY)
Admission: RE | Admit: 2013-08-29 | Discharge: 2013-08-29 | Disposition: A | Discharge status: Home / Self Care | Payer: Medicaid Other | Source: Ambulatory Visit | Attending: Hematology and Oncology | Admitting: Hematology and Oncology

## 2013-08-29 ENCOUNTER — Encounter (HOSPITAL_COMMUNITY): Payer: Self-pay

## 2013-08-29 ENCOUNTER — Encounter (HOSPITAL_COMMUNITY): Payer: Medicaid Other

## 2013-08-29 DIAGNOSIS — R091 Pleurisy: Secondary | ICD-10-CM | POA: Insufficient documentation

## 2013-08-29 DIAGNOSIS — C189 Malignant neoplasm of colon, unspecified: Secondary | ICD-10-CM

## 2013-08-29 DIAGNOSIS — M1710 Unilateral primary osteoarthritis, unspecified knee: Secondary | ICD-10-CM | POA: Insufficient documentation

## 2013-08-29 DIAGNOSIS — K7689 Other specified diseases of liver: Secondary | ICD-10-CM | POA: Insufficient documentation

## 2013-08-29 DIAGNOSIS — B1920 Unspecified viral hepatitis C without hepatic coma: Secondary | ICD-10-CM | POA: Insufficient documentation

## 2013-08-29 DIAGNOSIS — I517 Cardiomegaly: Secondary | ICD-10-CM | POA: Insufficient documentation

## 2013-08-29 DIAGNOSIS — M25569 Pain in unspecified knee: Secondary | ICD-10-CM | POA: Insufficient documentation

## 2013-08-29 DIAGNOSIS — K8689 Other specified diseases of pancreas: Secondary | ICD-10-CM | POA: Insufficient documentation

## 2013-08-29 DIAGNOSIS — N839 Noninflammatory disorder of ovary, fallopian tube and broad ligament, unspecified: Secondary | ICD-10-CM | POA: Insufficient documentation

## 2013-08-29 DIAGNOSIS — C786 Secondary malignant neoplasm of retroperitoneum and peritoneum: Secondary | ICD-10-CM | POA: Insufficient documentation

## 2013-08-29 DIAGNOSIS — Z85038 Personal history of other malignant neoplasm of large intestine: Secondary | ICD-10-CM | POA: Insufficient documentation

## 2013-08-29 DIAGNOSIS — Z9089 Acquired absence of other organs: Secondary | ICD-10-CM | POA: Insufficient documentation

## 2013-08-29 MED ORDER — IOHEXOL 300 MG/ML  SOLN
100.0000 mL | Freq: Once | INTRAMUSCULAR | Status: AC | PRN
  Administered 2013-08-29: 100 mL via INTRAVENOUS

## 2013-08-29 MED ORDER — TECHNETIUM TC 99M MEDRONATE IV KIT
27.5000 | PACK | Freq: Once | INTRAVENOUS | Status: AC | PRN
  Administered 2013-08-29: 27.5 via INTRAVENOUS

## 2013-08-30 ENCOUNTER — Other Ambulatory Visit (HOSPITAL_BASED_OUTPATIENT_CLINIC_OR_DEPARTMENT_OTHER): Payer: Medicaid Other

## 2013-08-30 ENCOUNTER — Encounter (HOSPITAL_COMMUNITY): Payer: Self-pay

## 2013-08-30 ENCOUNTER — Telehealth: Payer: Self-pay | Admitting: Hematology and Oncology

## 2013-08-30 ENCOUNTER — Ambulatory Visit (HOSPITAL_BASED_OUTPATIENT_CLINIC_OR_DEPARTMENT_OTHER): Payer: Medicaid Other | Admitting: Hematology and Oncology

## 2013-08-30 VITALS — BP 118/71 | HR 68 | Temp 97.4°F | Resp 20 | Ht 65.0 in | Wt 164.7 lb

## 2013-08-30 DIAGNOSIS — C189 Malignant neoplasm of colon, unspecified: Secondary | ICD-10-CM

## 2013-08-30 DIAGNOSIS — N183 Chronic kidney disease, stage 3 unspecified: Secondary | ICD-10-CM

## 2013-08-30 DIAGNOSIS — C182 Malignant neoplasm of ascending colon: Secondary | ICD-10-CM

## 2013-08-30 DIAGNOSIS — F17200 Nicotine dependence, unspecified, uncomplicated: Secondary | ICD-10-CM

## 2013-08-30 DIAGNOSIS — E109 Type 1 diabetes mellitus without complications: Secondary | ICD-10-CM

## 2013-08-30 LAB — COMPREHENSIVE METABOLIC PANEL (CC13)
ALK PHOS: 168 U/L — AB (ref 40–150)
ALT: 21 U/L (ref 0–55)
AST: 24 U/L (ref 5–34)
Albumin: 3.7 g/dL (ref 3.5–5.0)
Anion Gap: 11 mEq/L (ref 3–11)
BILIRUBIN TOTAL: 0.35 mg/dL (ref 0.20–1.20)
BUN: 12.9 mg/dL (ref 7.0–26.0)
CO2: 32 mEq/L — ABNORMAL HIGH (ref 22–29)
Calcium: 9.7 mg/dL (ref 8.4–10.4)
Chloride: 99 mEq/L (ref 98–109)
Creatinine: 1.3 mg/dL — ABNORMAL HIGH (ref 0.6–1.1)
Glucose: 69 mg/dl — ABNORMAL LOW (ref 70–140)
POTASSIUM: 3.4 meq/L — AB (ref 3.5–5.1)
SODIUM: 143 meq/L (ref 136–145)
Total Protein: 7.6 g/dL (ref 6.4–8.3)

## 2013-08-30 LAB — CBC & DIFF AND RETIC
BASO%: 0.6 % (ref 0.0–2.0)
Basophils Absolute: 0 10*3/uL (ref 0.0–0.1)
EOS ABS: 0.1 10*3/uL (ref 0.0–0.5)
EOS%: 3.2 % (ref 0.0–7.0)
HCT: 37 % (ref 34.8–46.6)
HGB: 12.3 g/dL (ref 11.6–15.9)
Immature Retic Fract: 1.3 % — ABNORMAL LOW (ref 1.60–10.00)
LYMPH%: 40.6 % (ref 14.0–49.7)
MCH: 27.8 pg (ref 25.1–34.0)
MCHC: 33.2 g/dL (ref 31.5–36.0)
MCV: 83.7 fL (ref 79.5–101.0)
MONO#: 0.3 10*3/uL (ref 0.1–0.9)
MONO%: 7.2 % (ref 0.0–14.0)
NEUT%: 48.4 % (ref 38.4–76.8)
NEUTROS ABS: 1.7 10*3/uL (ref 1.5–6.5)
Platelets: 155 10*3/uL (ref 145–400)
RBC: 4.42 10*6/uL (ref 3.70–5.45)
RDW: 13.7 % (ref 11.2–14.5)
RETIC %: 0.57 % — AB (ref 0.70–2.10)
Retic Ct Abs: 25.19 10*3/uL — ABNORMAL LOW (ref 33.70–90.70)
WBC: 3.5 10*3/uL — AB (ref 3.9–10.3)
lymph#: 1.4 10*3/uL (ref 0.9–3.3)

## 2013-08-30 LAB — FERRITIN CHCC: FERRITIN: 65 ng/mL (ref 9–269)

## 2013-08-30 LAB — UA PROTEIN, DIPSTICK - CHCC: Protein, ur: NEGATIVE mg/dL

## 2013-08-30 NOTE — Progress Notes (Signed)
[facility] OFFICE PROGRESS NOTE  Patient Care Team: Angelica Ran, MD as PCP - General (Family Medicine) Windy Kalata, MD as Consulting Physician (Nephrology) Leatrice Jewels Rayetta Pigg, MD as Consulting Physician (Psychiatry) Heath Lark, MD as Consulting Physician (Hematology and Oncology)  DIAGNOSIS: stage IV metastatic colon cancer  SUMMARY OF ONCOLOGIC HISTORY: Oncology History   Colon cancer, KRAS positive   Primary site: Colon and Rectum (Right)   Staging method: AJCC 7th Edition   Clinical: (T4b, N2b, M1) signed by Heath Lark, MD on 08/05/2013  9:52 AM   Pathologic: Stage IVB (T4b, N2b, M1b) signed by Heath Lark, MD on 08/05/2013 10:21 AM   Summary: Stage IVB (T4b, N2b, M1b)      Colon cancer   07/15/2013 - 07/25/2013 Hospital Admission The patient presented to the hospital with pain in her abdomen and underwent surgery for colon cancer   07/15/2013 Imaging CT scan of the abdomen show bowel dilatation suspicious for malignancy   07/17/2013 Tumor Marker Preoperative CEA was 2.2, normal   07/19/2013 Procedure Colonoscopy revealed ascending colon lesion with significant stricture and biopsy showed adenocarcinoma   07/20/2013 Surgery She underwent right hemicolectomy, cholecystectomy, liver biopsy, excision of abdomen no wall mass and omental mass. During surgery, she was also found to have drop metastasis    INTERVAL HISTORY: TAVI GAUGHRAN 53 y.o. female returns for further followup. Her incision wound has healed. She has poor appetite.   I have reviewed the past medical history, past surgical history, social history and family history with the patient and they are unchanged from previous note.  ALLERGIES:  has No Known Allergies.  MEDICATIONS:  Current Outpatient Prescriptions  Medication Sig Dispense Refill  . ACCU-CHEK AVIVA PLUS test strip USE AS DIRECTED  100 each  10  . atorvastatin (LIPITOR) 40 MG tablet Take 1 tablet (40 mg total) by  mouth daily.  90 tablet  3  . buprenorphine-naloxone (SUBOXONE) 8-2 MG SUBL SL tablet Place 1 tablet under the tongue 3 (three) times daily.      . clonazePAM (KLONOPIN) 1 MG tablet Take 1 mg by mouth 3 (three) times daily as needed for anxiety.       . diazepam (VALIUM) 10 MG tablet Take 10 mg by mouth 3 (three) times daily.      Marland Kitchen docusate sodium (COLACE) 100 MG capsule TAKE ONE CAPSULE BY MOUTH TWICE A DAY  60 capsule  5  . furosemide (LASIX) 40 MG tablet Take 1 tablet (40 mg total) by mouth 2 (two) times daily.  60 tablet  5  . gabapentin (NEURONTIN) 800 MG tablet Take 800 mg by mouth 2 (two) times daily.      Marland Kitchen HUMALOG 100 UNIT/ML injection INJECT 2-8 UNITS INTO THE SKIN DAILY. PT USES SLIDING SCALE  10 mL  3  . hydrOXYzine (ATARAX/VISTARIL) 25 MG tablet Take 50 mg by mouth 3 (three) times daily as needed for anxiety.      Marland Kitchen ibuprofen (ADVIL,MOTRIN) 800 MG tablet Take 800 mg by mouth every 8 (eight) hours as needed for fever, headache, mild pain or moderate pain.      Marland Kitchen insulin glargine (LANTUS) 100 UNIT/ML injection Inject 25 Units into the skin daily.       Marland Kitchen lamoTRIgine (LAMICTAL) 100 MG tablet Take 100 mg by mouth 2 (two) times daily.      Marland Kitchen lidocaine-prilocaine (EMLA) cream Apply 1 application topically as needed (for pain).      Marland Kitchen omeprazole (PRILOSEC)  20 MG capsule Take 20 mg by mouth daily.      . ondansetron (ZOFRAN) 8 MG tablet Take 1 tablet (8 mg total) by mouth every 8 (eight) hours as needed.  30 tablet  1  . OVER THE COUNTER MEDICATION Apply 1 application to eye daily.      . polyethylene glycol (MIRALAX / GLYCOLAX) packet Take 17 g by mouth 2 (two) times daily.       . prochlorperazine (COMPAZINE) 10 MG tablet Take 1 tablet (10 mg total) by mouth every 6 (six) hours as needed (Nausea or vomiting).  30 tablet  1  . ramelteon (ROZEREM) 8 MG tablet Take 8 mg by mouth at bedtime.      . ziprasidone (GEODON) 80 MG capsule Take 80 mg by mouth at bedtime.        No current  facility-administered medications for this visit.    REVIEW OF SYSTEMS:   Neurological:she has chronic neuropathy Behavioral/Psych: Mood is stable, no new changes  All other systems were reviewed with the patient and are negative.  PHYSICAL EXAMINATION: ECOG PERFORMANCE STATUS: 1 - Symptomatic but completely ambulatory  Filed Vitals:   08/30/13 1511  BP: 118/71  Pulse: 68  Temp: 97.4 F (36.3 C)  Resp: 20   Filed Weights   08/30/13 1511  Weight: 164 lb 11.2 oz (74.707 kg)    GENERAL:alert, no distress and comfortable Musculoskeletal:no cyanosis of digits and no clubbing  NEURO: alert & oriented x 3 with fluent speech, no focal motor/sensory deficits  LABORATORY DATA:  I have reviewed the data as listed    Component Value Date/Time   NA 143 08/30/2013 1444   NA 143 08/12/2013 1005   K 3.4* 08/30/2013 1444   K 4.2 08/12/2013 1005   CL 105 08/12/2013 1005   CO2 32* 08/30/2013 1444   CO2 27 08/12/2013 1005   GLUCOSE 69* 08/30/2013 1444   GLUCOSE 95 08/12/2013 1005   BUN 12.9 08/30/2013 1444   BUN 13 08/12/2013 1005   CREATININE 1.3* 08/30/2013 1444   CREATININE 1.14* 08/12/2013 1005   CREATININE 1.38* 05/26/2013 1703   CALCIUM 9.7 08/30/2013 1444   CALCIUM 8.7 08/12/2013 1005   PROT 7.6 08/30/2013 1444   PROT 6.4 08/12/2013 1005   ALBUMIN 3.7 08/30/2013 1444   ALBUMIN 2.9* 08/12/2013 1005   AST 24 08/30/2013 1444   AST 22 08/12/2013 1005   ALT 21 08/30/2013 1444   ALT 23 08/12/2013 1005   ALKPHOS 168* 08/30/2013 1444   ALKPHOS 199* 08/12/2013 1005   BILITOT 0.35 08/30/2013 1444   BILITOT 0.2* 08/12/2013 1005   GFRNONAA 55* 08/12/2013 1005   GFRAA 63* 08/12/2013 1005    No results found for this basename: SPEP, UPEP,  kappa and lambda light chains    Lab Results  Component Value Date   WBC 3.5* 08/30/2013   NEUTROABS 1.7 08/30/2013   HGB 12.3 08/30/2013   HCT 37.0 08/30/2013   MCV 83.7 08/30/2013   PLT 155 08/30/2013      Chemistry      Component Value Date/Time   NA 143 08/30/2013 1444   NA  143 08/12/2013 1005   K 3.4* 08/30/2013 1444   K 4.2 08/12/2013 1005   CL 105 08/12/2013 1005   CO2 32* 08/30/2013 1444   CO2 27 08/12/2013 1005   BUN 12.9 08/30/2013 1444   BUN 13 08/12/2013 1005   CREATININE 1.3* 08/30/2013 1444   CREATININE 1.14* 08/12/2013 1005   CREATININE  1.38* 05/26/2013 1703      Component Value Date/Time   CALCIUM 9.7 08/30/2013 1444   CALCIUM 8.7 08/12/2013 1005   ALKPHOS 168* 08/30/2013 1444   ALKPHOS 199* 08/12/2013 1005   AST 24 08/30/2013 1444   AST 22 08/12/2013 1005   ALT 21 08/30/2013 1444   ALT 23 08/12/2013 1005   BILITOT 0.35 08/30/2013 1444   BILITOT 0.2* 08/12/2013 1005       RADIOGRAPHIC STUDIES:I reviewed the Ct scan with the patient and her mother  I have personally reviewed the radiological images as listed and agreed with the findings in the report. Ct Chest W Contrast  08/29/2013   CLINICAL DATA:  Staging of colon cancer. Diagnosed 12/14. Chemotherapy to start. Colon resection. History of hepatitis-C, substance abuse. Constipation and diarrhea.  EXAM: CT CHEST, ABDOMEN, AND PELVIS WITH CONTRAST  TECHNIQUE: Multidetector CT imaging of the chest, abdomen and pelvis was performed following the standard protocol during bolus administration of intravenous contrast.  CONTRAST:  112m OMNIPAQUE IOHEXOL 300 MG/ML  SOLN  COMPARISON:  [place] dated 07/15/2013; CT ABD/PELV WO CM dated 07/15/2013; DG CHEST 1V PORT dated 08/12/2013  FINDINGS: CT CHEST FINDINGS  Lungs/Pleura: No pulmonary parenchymal nodules identified. No pleural fluid. Areas of right-sided pleural-based irregularity. Example along the right hemidiaphragm on sagittal image 34 and transverse image 42. 1.2 cm. Anterior right pleural irregularity at 8 mm on image 16/series 2.  Heart/Mediastinum: No supraclavicular adenopathy. A left-sided Port-A-Cath which terminates at the low SVC.  Mild cardiomegaly, without pericardial effusion. No central pulmonary embolism, on this non-dedicated study. No mediastinal or  hilar adenopathy.  CT ABDOMEN AND PELVIS FINDINGS  Abdomen/Pelvis: Central anterior segment right liver lobe 2.2 cm lesion on image 52/series 2. Not readily apparent on the prior degraded unenhanced CT. Normal spleen, stomach. Pancreatic atrophy. Interval cholecystectomy without biliary ductal dilatation.  Normal adrenal glands and kidneys. Multiple retroperitoneal nodes. Index left periaortic node measures 9 mm on image 66 versus 8 mm on the prior. Not pathologic by size criteria.  Colonic stool burden suggests constipation. Surgical changes in the region the ascending colon.  There is now a nodularity within the greater omentum, consistent with peritoneal disease. Example in the right side of the abdomen at 8 mm on image 67/series 2. Resolution of ascites. Soft tissue thickening along the hepatic dome on image 47 coronal is likely due to peritoneal disease  Possible adenopathy along the left common iliac artery on image 85/series 2. Poorly evaluated secondary to unopacified common iliac vein in this area.  Normal urinary bladder. Probable left-sided uterine fundal fibroid at 3.7 cm on image 99. Left ovarian size upper normal with indeterminate hypo attenuating lesions within. Example image 95. Total ovarian size of 4.7 cm. Trace free pelvic fluid is likely physiologic.  Bones/Musculoskeletal: No acute osseous abnormality. A disc bulge at L3-4.  IMPRESSION: CT CHEST IMPRESSION  1. Right-sided pleural-based irregularity, suspicious for metastatic disease. 2. No other areas of metastatic disease within the chest.  CT ABDOMEN AND PELVIS IMPRESSION  1. Central right liver lobe lesion, consistent with metastatic disease. This is likely new since the prior exam. Of note, that study was of low sensitivity secondary to artifact and lack of IV contrast. 2. Omental/peritoneal metastasis. 3. Interval resection of ascending colonic primary. 4.  Possible constipation. 5. Soft tissue fullness within the left ovary.  Indeterminate. Could be physiologic due to underlying complex cysts. Cannot exclude early ovarian metastasis. 6. Suspicion of adenopathy along the left common  iliac chain. This warrants followup attention.   Electronically Signed   By: Abigail Miyamoto M.D.   On: 08/29/2013 09:31   Nm Bone Scan Whole Body  08/29/2013   CLINICAL DATA:  Knee pain, a history of colon cancer  EXAM: NUCLEAR MEDICINE WHOLE BODY BONE SCAN  TECHNIQUE: Whole body anterior and posterior images were obtained approximately 3 hours after intravenous injection of radiopharmaceutical.  COMPARISON:  None  RADIOPHARMACEUTICALS:  27.5 Technetium-99 MDP  FINDINGS: No abnormal focus of increased or decreased radiotracer uptake is identified to suggest bone metastases. There is bilateral and symmetric patellofemoral osteoarthritis changes identified. Normal physiologic tracer activity is seen within the kidneys an urinary bladder.  IMPRESSION: 1. No evidence for bone metastasis. 2. Bilateral patellofemoral osteoarthritis.   Electronically Signed   By: Kerby Moors M.D.   On: 08/29/2013 14:17   Ct Abdomen Pelvis W Contrast  08/29/2013   CLINICAL DATA:  Staging of colon cancer. Diagnosed 12/14. Chemotherapy to start. Colon resection. History of hepatitis-C, substance abuse. Constipation and diarrhea.  EXAM: CT CHEST, ABDOMEN, AND PELVIS WITH CONTRAST  TECHNIQUE: Multidetector CT imaging of the chest, abdomen and pelvis was performed following the standard protocol during bolus administration of intravenous contrast.  CONTRAST:  163m OMNIPAQUE IOHEXOL 300 MG/ML  SOLN  COMPARISON:  [place] COMPLETE dated 07/15/2013; CT ABD/PELV WO CM dated 07/15/2013; DG CHEST 1V PORT dated 08/12/2013  FINDINGS: CT CHEST FINDINGS  Lungs/Pleura: No pulmonary parenchymal nodules identified. No pleural fluid. Areas of right-sided pleural-based irregularity. Example along the right hemidiaphragm on sagittal image 34 and transverse image 42. 1.2 cm. Anterior right pleural  irregularity at 8 mm on image 16/series 2.  Heart/Mediastinum: No supraclavicular adenopathy. A left-sided Port-A-Cath which terminates at the low SVC.  Mild cardiomegaly, without pericardial effusion. No central pulmonary embolism, on this non-dedicated study. No mediastinal or hilar adenopathy.  CT ABDOMEN AND PELVIS FINDINGS  Abdomen/Pelvis: Central anterior segment right liver lobe 2.2 cm lesion on image 52/series 2. Not readily apparent on the prior degraded unenhanced CT. Normal spleen, stomach. Pancreatic atrophy. Interval cholecystectomy without biliary ductal dilatation.  Normal adrenal glands and kidneys. Multiple retroperitoneal nodes. Index left periaortic node measures 9 mm on image 66 versus 8 mm on the prior. Not pathologic by size criteria.  Colonic stool burden suggests constipation. Surgical changes in the region the ascending colon.  There is now a nodularity within the greater omentum, consistent with peritoneal disease. Example in the right side of the abdomen at 8 mm on image 67/series 2. Resolution of ascites. Soft tissue thickening along the hepatic dome on image 47 coronal is likely due to peritoneal disease  Possible adenopathy along the left common iliac artery on image 85/series 2. Poorly evaluated secondary to unopacified common iliac vein in this area.  Normal urinary bladder. Probable left-sided uterine fundal fibroid at 3.7 cm on image 99. Left ovarian size upper normal with indeterminate hypo attenuating lesions within. Example image 95. Total ovarian size of 4.7 cm. Trace free pelvic fluid is likely physiologic.  Bones/Musculoskeletal: No acute osseous abnormality. A disc bulge at L3-4.  IMPRESSION: CT CHEST IMPRESSION  1. Right-sided pleural-based irregularity, suspicious for metastatic disease. 2. No other areas of metastatic disease within the chest.  CT ABDOMEN AND PELVIS IMPRESSION  1. Central right liver lobe lesion, consistent with metastatic disease. This is likely new since  the prior exam. Of note, that study was of low sensitivity secondary to artifact and lack of IV contrast. 2. Omental/peritoneal metastasis.  3. Interval resection of ascending colonic primary. 4.  Possible constipation. 5. Soft tissue fullness within the left ovary. Indeterminate. Could be physiologic due to underlying complex cysts. Cannot exclude early ovarian metastasis. 6. Suspicion of adenopathy along the left common iliac chain. This warrants followup attention.   Electronically Signed   By: Abigail Miyamoto M.D.   On: 08/29/2013 09:31      ASSESSMENT & PLAN:  #1 Stage IV colon cancer We discussed the role of chemotherapy. The intent is for palliative.  We discussed some of the risks, benefits, side-effects of 5FU. LV and oxaliplatin.   Some of the short term side-effects included, though not limited to, risk of severe allergic reaction, fatigue, weight loss, pancytopenia, life-threatening infections, need for transfusions of blood products, nausea, vomiting, change in bowel habits, admission to hospital for various reasons, and risks of death.   Long term side-effects are also discussed including risks of infertility, permanent damage to nerve function, chronic fatigue, and rare secondary malignancy including bone marrow disorders.   The patient is aware that the response rates discussed earlier is not guaranteed.    After a long discussion, patient made an informed decision to proceed with the prescribed plan of care and went ahead to sign the consent form today.   Patient education material was dispensed Due to history of neuropathy, I plan to reduce oxaliplatin by 20%. I will also omit 5FU bolus #2 diabetes She will continue her current insulin regimen #3 mild leukopenia Causes unknown. I will observe now #4 history of chronic pain Continue to observe. I will not prescribe narcotic prescription for her right now. #5 elevated alkaline phosphatase Causes unknown. Liver biopsy was  negative. This could be residual effect from history of hepatitis C #6 elevated creatinine Recommend she follow with her nephrologist and increased oral intake of fluids #7 tobacco abuse Continue to encourage her smoking cessation of effort. All questions were answered. The patient knows to call the clinic with any problems, questions or concerns. No barriers to learning was detected. I spent 40 minutes counseling the patient face to face. The total time spent in the appointment was 60 minutes and more than 50% was on counseling and review of test results     [facility], [place], MD 08/30/2013 8:23 PM

## 2013-08-30 NOTE — Telephone Encounter (Signed)
gv and printed appt sched and avs for pt for Feb....emailed MW to add tx.

## 2013-08-31 ENCOUNTER — Telehealth: Payer: Self-pay | Admitting: *Deleted

## 2013-08-31 ENCOUNTER — Telehealth: Payer: Self-pay | Admitting: Hematology and Oncology

## 2013-08-31 ENCOUNTER — Ambulatory Visit (HOSPITAL_BASED_OUTPATIENT_CLINIC_OR_DEPARTMENT_OTHER): Payer: Medicaid Other

## 2013-08-31 VITALS — BP 111/65 | HR 71 | Temp 97.8°F | Resp 18

## 2013-08-31 DIAGNOSIS — C189 Malignant neoplasm of colon, unspecified: Secondary | ICD-10-CM

## 2013-08-31 DIAGNOSIS — Z5111 Encounter for antineoplastic chemotherapy: Secondary | ICD-10-CM

## 2013-08-31 DIAGNOSIS — C182 Malignant neoplasm of ascending colon: Secondary | ICD-10-CM

## 2013-08-31 MED ORDER — DEXAMETHASONE SODIUM PHOSPHATE 10 MG/ML IJ SOLN
INTRAMUSCULAR | Status: AC
  Filled 2013-08-31: qty 1

## 2013-08-31 MED ORDER — DEXTROSE 5 % IV SOLN
400.0000 mg/m2 | Freq: Once | INTRAVENOUS | Status: AC
  Administered 2013-08-31: 732 mg via INTRAVENOUS
  Filled 2013-08-31: qty 36.6

## 2013-08-31 MED ORDER — OXALIPLATIN CHEMO INJECTION 100 MG/20ML
68.0000 mg/m2 | Freq: Once | INTRAVENOUS | Status: AC
  Administered 2013-08-31: 125 mg via INTRAVENOUS
  Filled 2013-08-31: qty 25

## 2013-08-31 MED ORDER — SODIUM CHLORIDE 0.9 % IV SOLN
2400.0000 mg/m2 | INTRAVENOUS | Status: DC
  Administered 2013-08-31: 4400 mg via INTRAVENOUS
  Filled 2013-08-31: qty 88

## 2013-08-31 MED ORDER — DEXTROSE 5 % IV SOLN
Freq: Once | INTRAVENOUS | Status: AC
  Administered 2013-08-31: 11:00:00 via INTRAVENOUS

## 2013-08-31 MED ORDER — ONDANSETRON 8 MG/NS 50 ML IVPB
INTRAVENOUS | Status: AC
  Filled 2013-08-31: qty 8

## 2013-08-31 MED ORDER — ONDANSETRON 8 MG/50ML IVPB (CHCC)
8.0000 mg | Freq: Once | INTRAVENOUS | Status: AC
  Administered 2013-08-31: 8 mg via INTRAVENOUS

## 2013-08-31 MED ORDER — DEXAMETHASONE SODIUM PHOSPHATE 10 MG/ML IJ SOLN
10.0000 mg | Freq: Once | INTRAMUSCULAR | Status: AC
  Administered 2013-08-31: 10 mg via INTRAVENOUS

## 2013-08-31 NOTE — Telephone Encounter (Signed)
Per staff message and POF I have scheduled appts. Advised scheduler first available on 2/11 is 1230pm. JMW

## 2013-08-31 NOTE — Telephone Encounter (Signed)
s,.w. pt and advised on 2.11.15 appt time change...pt ok and aware

## 2013-08-31 NOTE — Patient Instructions (Addendum)
[facility] Discharge Instructions for Patients Receiving Chemotherapy  Today you received the following chemotherapy agents: Oxaliplatin, Leucovorin, and 5FU   To help prevent nausea and vomiting after your treatment, we encourage you to take your nausea medication as prescribed. You received Zofran and Decadron in the infusion room prior to chemotherapy. You may have Zofran again at 7 PM if needed for nausea. You may take Compazine for nausea if needed starting now.   If you develop nausea and vomiting that is not controlled by your nausea medication, call the clinic.   BELOW ARE SYMPTOMS THAT SHOULD BE REPORTED IMMEDIATELY:  *FEVER GREATER THAN 100.5 F  *CHILLS WITH OR WITHOUT FEVER  NAUSEA AND VOMITING THAT IS NOT CONTROLLED WITH YOUR NAUSEA MEDICATION  *UNUSUAL SHORTNESS OF BREATH  *UNUSUAL BRUISING OR BLEEDING  TENDERNESS IN MOUTH AND THROAT WITH OR WITHOUT PRESENCE OF ULCERS  *URINARY PROBLEMS  *BOWEL PROBLEMS  UNUSUAL RASH Items with * indicate a potential emergency and should be followed up as soon as possible.  Feel free to call the clinic you have any questions or concerns. The clinic phone number is [phone].

## 2013-09-01 ENCOUNTER — Encounter: Payer: Self-pay | Admitting: *Deleted

## 2013-09-01 NOTE — Progress Notes (Signed)
[facility] Psychosocial Distress Screening Clinical Social Work  Clinical Social Work was referred by distress screening protocol.  The patient scored a 10 on the Psychosocial Distress Thermometer which indicates severe distress. Clinical Social Worker contacted patient by phone to assess for distress and other psychosocial needs. The patient states she completed her first chemotherapy appointment yesterday and has no complaints.  Ms. Trostle reports her level of distress has lowered slightly since her chemotherapy education class, but still has anxiety.  She reports largest cause of emotional distress is the fear of death and the unknown.  CSW validated patients feelings and provided brief support.  CSW encouraged patient to join GI cancer support group (patient may also be appropriate for Living With Cancer class/group, but CSW did not discuss at this time).  CSW also informed patient of free counseling offered at center.  Ms. Qazi states she may be open to services in the future and plans to follow up with CSW by phone.  CSW will await patient's contact.   Clinical Social Worker follow up needed: no  If yes, follow up plan:  Polo Riley, MSW, LCSW, OSW-C Clinical Social Worker [facility] [phone]

## 2013-09-02 ENCOUNTER — Ambulatory Visit (HOSPITAL_BASED_OUTPATIENT_CLINIC_OR_DEPARTMENT_OTHER): Payer: Medicaid Other

## 2013-09-02 VITALS — BP 116/70 | HR 81 | Temp 97.8°F

## 2013-09-02 DIAGNOSIS — C182 Malignant neoplasm of ascending colon: Secondary | ICD-10-CM

## 2013-09-02 DIAGNOSIS — Z452 Encounter for adjustment and management of vascular access device: Secondary | ICD-10-CM

## 2013-09-02 DIAGNOSIS — C189 Malignant neoplasm of colon, unspecified: Secondary | ICD-10-CM

## 2013-09-02 MED ORDER — SODIUM CHLORIDE 0.9 % IJ SOLN
10.0000 mL | INTRAMUSCULAR | Status: DC | PRN
  Administered 2013-09-02: 10 mL
  Filled 2013-09-02: qty 10

## 2013-09-02 MED ORDER — HEPARIN SOD (PORK) LOCK FLUSH 100 UNIT/ML IV SOLN
500.0000 [IU] | Freq: Once | INTRAVENOUS | Status: AC | PRN
  Administered 2013-09-02: 500 [IU]
  Filled 2013-09-02: qty 5

## 2013-09-02 NOTE — Patient Instructions (Signed)

## 2013-09-05 ENCOUNTER — Encounter: Payer: Self-pay | Admitting: Family Medicine

## 2013-09-05 ENCOUNTER — Ambulatory Visit (INDEPENDENT_AMBULATORY_CARE_PROVIDER_SITE_OTHER): Payer: Medicaid Other | Admitting: Family Medicine

## 2013-09-05 ENCOUNTER — Other Ambulatory Visit: Payer: Self-pay | Admitting: Hematology and Oncology

## 2013-09-05 ENCOUNTER — Telehealth: Payer: Self-pay | Admitting: *Deleted

## 2013-09-05 ENCOUNTER — Encounter: Payer: Self-pay | Admitting: Hematology and Oncology

## 2013-09-05 VITALS — BP 116/68 | HR 75 | Ht 65.0 in | Wt 164.2 lb

## 2013-09-05 DIAGNOSIS — N946 Dysmenorrhea, unspecified: Secondary | ICD-10-CM

## 2013-09-05 DIAGNOSIS — E109 Type 1 diabetes mellitus without complications: Secondary | ICD-10-CM

## 2013-09-05 HISTORY — DX: Dysmenorrhea, unspecified: N94.6

## 2013-09-05 NOTE — Telephone Encounter (Signed)
Message copied by Cathlean Cower on Mon Sep 05, 2013  4:15 PM ------      Message from: [facility], [employer]      Created: Mon Sep 05, 2013  3:55 PM       See my recent message      Please reassure pt, I will check hormone levels next visit      ----- Message -----         From: Cathlean Cower, RN         Sent: 09/05/2013   3:29 PM           To: Heath Lark, MD            Please advise       ----- Message -----         From: Tomasa Rand, RN         Sent: 09/05/2013   1:29 PM           To: Cathlean Cower, RN, Heath Lark, MD            Patient states she had chemo this past Thursday and started her period the same day.  She has not had a period in years.  Should she be concerned?  Her flow is light.             ------

## 2013-09-05 NOTE — Assessment & Plan Note (Signed)
A: hyperglycemia requirement 2/2 to chemotherapy P:  - Increase Lantus to 30 units with humalog 10-15 units with meals  - INSTRUCTED NEVER TO SKIP MEALS - F/u after next chemo session

## 2013-09-05 NOTE — Telephone Encounter (Signed)
Left Vm for pt to return nurse's call.

## 2013-09-05 NOTE — Progress Notes (Signed)
   Subjective:    Patient ID: Monica [name], female    DOB: 1961-05-15, 53 y.o.   MRN: [id number]  HPI  53 year old F with metastatic adenocarcinoma of the colon (T4b, N2b, M1) and type 1 DMwho presents for follow up.    Diabetes - Type 1, Last a1c 6.9 but VERY VERY labile; CBG's very high after starting palliative chemo for colon cancer on 08/31/12; recent insulin use has been 27 units of Lantus with 40 units throughout the day of  Humalog; CBGs ranged 46 (yesterday after skipping a meal) to 544; pt still skipping meals consistently    Review of Systems Denies nausea, vomiting, fever, chills, diarrhea     Objective:   Physical Exam BP 116/68  Pulse 75  Ht 5\' 5"  (1.651 m)  Wt 164 lb 3.2 oz (74.481 kg)  BMI 27.32 kg/m2  Gen: middle age female, remarkable well appearing at this time Abd: well healing incision, mild distension, mild tenderness        Assessment & Plan:

## 2013-09-05 NOTE — Patient Instructions (Signed)
Monica [name],  It was great to see you today. I am glad that you tolerated chemo so well. You should go up on your Lantus to 30 units on your chemo days and for 4 days afterwards. DO NOT SKIP MEALS. You can cover for the high blood sugar and the food.   Please come back in 3-5 weeks after you have chemo again.   Sincerely,   Dr. Maricela Bo

## 2013-09-06 ENCOUNTER — Telehealth: Payer: Self-pay | Admitting: *Deleted

## 2013-09-06 NOTE — Telephone Encounter (Signed)
Pt returned nurse's call.  States she continues to have light menstrual flow since chemo last week.  States not very heavy "just annoying."   Reassured pt it is nothing to be concerned about and Dr. Alvy Bimler plans on checking her hormone levels on next visit.  Confirmed her next appt on 2/11.  Pt states otherwise doing ok, denies any fevers, diarrhea, constipation, n/v or mouth sores.  Instructed her to call for any other concerns prior to next appt.. She verbalized understanding.

## 2013-09-09 ENCOUNTER — Telehealth: Payer: Self-pay | Admitting: Family Medicine

## 2013-09-09 DIAGNOSIS — E1140 Type 2 diabetes mellitus with diabetic neuropathy, unspecified: Secondary | ICD-10-CM

## 2013-09-09 NOTE — Telephone Encounter (Signed)
Insurance will not cover gabapentin. This began 08/04/13. She will need prior-auth or a replacement med.

## 2013-09-12 NOTE — Telephone Encounter (Signed)
Yes, patient did called.

## 2013-09-13 ENCOUNTER — Telehealth (INDEPENDENT_AMBULATORY_CARE_PROVIDER_SITE_OTHER): Payer: Self-pay | Admitting: *Deleted

## 2013-09-13 ENCOUNTER — Other Ambulatory Visit (INDEPENDENT_AMBULATORY_CARE_PROVIDER_SITE_OTHER): Payer: Self-pay | Admitting: *Deleted

## 2013-09-13 DIAGNOSIS — R109 Unspecified abdominal pain: Secondary | ICD-10-CM

## 2013-09-13 MED ORDER — GABAPENTIN 300 MG PO CAPS: 600.0000 mg | ORAL_CAPSULE | Freq: Two times a day (BID) | ORAL | Status: DC

## 2013-09-13 NOTE — Telephone Encounter (Signed)
Please send her for 2 view abdominal x-ray and also have her call Dr. Alvy Bimler from oncology. She is currently undergoing chemo for metastatic colon cancer. Thanks

## 2013-09-13 NOTE — Telephone Encounter (Signed)
Patient called to report that she is having continued abdominal pain.  Patient had a partial colectomy on 07/20/2013.  Explained that I would need to send a message to Dr. Grandville Silos to get his review of this then once a decision is made we will let her know.  Patient states understanding and agreeable at this time.

## 2013-09-13 NOTE — Addendum Note (Signed)
Addended by: Angelica Ran on: 09/13/2013 05:45 PM   Modules accepted: Orders, Medications

## 2013-09-13 NOTE — Telephone Encounter (Signed)
Pt contact. Will switch to 300 mg capsule which is preferred by [place] medicaid.

## 2013-09-13 NOTE — Telephone Encounter (Signed)
Order placed for XR.  Spoke to patient who is aware that she needs to go and have XR completed.  She also is aware that she needs to call Dr. Alvy Bimler to make her aware of what is going on.

## 2013-09-14 ENCOUNTER — Ambulatory Visit (HOSPITAL_BASED_OUTPATIENT_CLINIC_OR_DEPARTMENT_OTHER): Payer: Medicaid Other

## 2013-09-14 ENCOUNTER — Encounter: Payer: Self-pay | Admitting: Hematology and Oncology

## 2013-09-14 ENCOUNTER — Other Ambulatory Visit: Payer: Self-pay

## 2013-09-14 ENCOUNTER — Ambulatory Visit (HOSPITAL_BASED_OUTPATIENT_CLINIC_OR_DEPARTMENT_OTHER): Payer: Self-pay | Admitting: Hematology and Oncology

## 2013-09-14 ENCOUNTER — Other Ambulatory Visit (HOSPITAL_BASED_OUTPATIENT_CLINIC_OR_DEPARTMENT_OTHER): Payer: Medicaid Other

## 2013-09-14 ENCOUNTER — Telehealth: Payer: Self-pay | Admitting: Hematology and Oncology

## 2013-09-14 ENCOUNTER — Ambulatory Visit: Payer: Self-pay | Admitting: Hematology and Oncology

## 2013-09-14 VITALS — BP 99/56 | HR 65 | Temp 97.8°F | Resp 20 | Ht 65.0 in | Wt 169.5 lb

## 2013-09-14 DIAGNOSIS — C786 Secondary malignant neoplasm of retroperitoneum and peritoneum: Secondary | ICD-10-CM

## 2013-09-14 DIAGNOSIS — N946 Dysmenorrhea, unspecified: Secondary | ICD-10-CM

## 2013-09-14 DIAGNOSIS — C189 Malignant neoplasm of colon, unspecified: Secondary | ICD-10-CM

## 2013-09-14 DIAGNOSIS — C50919 Malignant neoplasm of unspecified site of unspecified female breast: Secondary | ICD-10-CM

## 2013-09-14 DIAGNOSIS — N189 Chronic kidney disease, unspecified: Secondary | ICD-10-CM

## 2013-09-14 DIAGNOSIS — C779 Secondary and unspecified malignant neoplasm of lymph node, unspecified: Secondary | ICD-10-CM

## 2013-09-14 DIAGNOSIS — I959 Hypotension, unspecified: Secondary | ICD-10-CM

## 2013-09-14 DIAGNOSIS — C182 Malignant neoplasm of ascending colon: Secondary | ICD-10-CM

## 2013-09-14 DIAGNOSIS — N183 Chronic kidney disease, stage 3 unspecified: Secondary | ICD-10-CM

## 2013-09-14 DIAGNOSIS — F17200 Nicotine dependence, unspecified, uncomplicated: Secondary | ICD-10-CM

## 2013-09-14 DIAGNOSIS — Z5111 Encounter for antineoplastic chemotherapy: Secondary | ICD-10-CM

## 2013-09-14 DIAGNOSIS — E109 Type 1 diabetes mellitus without complications: Secondary | ICD-10-CM

## 2013-09-14 DIAGNOSIS — D72819 Decreased white blood cell count, unspecified: Secondary | ICD-10-CM

## 2013-09-14 LAB — COMPREHENSIVE METABOLIC PANEL (CC13)
ALT: 29 U/L (ref 0–55)
AST: 29 U/L (ref 5–34)
Albumin: 3.4 g/dL — ABNORMAL LOW (ref 3.5–5.0)
Alkaline Phosphatase: 128 U/L (ref 40–150)
Anion Gap: 10 mEq/L (ref 3–11)
BILIRUBIN TOTAL: 0.24 mg/dL (ref 0.20–1.20)
BUN: 15.2 mg/dL (ref 7.0–26.0)
CHLORIDE: 104 meq/L (ref 98–109)
CO2: 30 meq/L — AB (ref 22–29)
Calcium: 9.3 mg/dL (ref 8.4–10.4)
Creatinine: 1.2 mg/dL — ABNORMAL HIGH (ref 0.6–1.1)
Glucose: 154 mg/dl — ABNORMAL HIGH (ref 70–140)
Potassium: 3.9 mEq/L (ref 3.5–5.1)
SODIUM: 143 meq/L (ref 136–145)
TOTAL PROTEIN: 6.9 g/dL (ref 6.4–8.3)

## 2013-09-14 LAB — LUTEINIZING HORMONE: LH: 23.3 m[IU]/mL

## 2013-09-14 LAB — CBC & DIFF AND RETIC
BASO%: 0.6 % (ref 0.0–2.0)
Basophils Absolute: 0 10*3/uL (ref 0.0–0.1)
EOS%: 2.1 % (ref 0.0–7.0)
Eosinophils Absolute: 0.1 10*3/uL (ref 0.0–0.5)
HCT: 35.4 % (ref 34.8–46.6)
HGB: 11.7 g/dL (ref 11.6–15.9)
Immature Retic Fract: 1.5 % — ABNORMAL LOW (ref 1.60–10.00)
LYMPH%: 28.6 % (ref 14.0–49.7)
MCH: 27.7 pg (ref 25.1–34.0)
MCHC: 33.1 g/dL (ref 31.5–36.0)
MCV: 83.7 fL (ref 79.5–101.0)
MONO#: 0.4 10*3/uL (ref 0.1–0.9)
MONO%: 12 % (ref 0.0–14.0)
NEUT#: 1.9 10*3/uL (ref 1.5–6.5)
NEUT%: 56.7 % (ref 38.4–76.8)
Platelets: 157 10*3/uL (ref 145–400)
RBC: 4.23 10*6/uL (ref 3.70–5.45)
RDW: 14.5 % (ref 11.2–14.5)
Retic %: 0.7 % (ref 0.70–2.10)
Retic Ct Abs: 29.61 10*3/uL — ABNORMAL LOW (ref 33.70–90.70)
WBC: 3.3 10*3/uL — ABNORMAL LOW (ref 3.9–10.3)
lymph#: 1 10*3/uL (ref 0.9–3.3)

## 2013-09-14 LAB — ESTRADIOL: Estradiol: <11.8 pg/mL

## 2013-09-14 LAB — FOLLICLE STIMULATING HORMONE: FSH: 82.5 m[IU]/mL

## 2013-09-14 MED ORDER — DEXAMETHASONE SODIUM PHOSPHATE 10 MG/ML IJ SOLN
INTRAMUSCULAR | Status: AC
  Filled 2013-09-14: qty 1

## 2013-09-14 MED ORDER — DEXAMETHASONE SODIUM PHOSPHATE 10 MG/ML IJ SOLN
10.0000 mg | Freq: Once | INTRAMUSCULAR | Status: AC
  Administered 2013-09-14: 10 mg via INTRAVENOUS

## 2013-09-14 MED ORDER — FLUOROURACIL CHEMO INJECTION 5 GM/100ML
2400.0000 mg/m2 | INTRAVENOUS | Status: DC
  Administered 2013-09-14: 4400 mg via INTRAVENOUS
  Filled 2013-09-14: qty 88

## 2013-09-14 MED ORDER — ONDANSETRON 8 MG/50ML IVPB (CHCC)
8.0000 mg | Freq: Once | INTRAVENOUS | Status: AC
Start: 2013-09-14 — End: 2013-09-14
  Administered 2013-09-14: 8 mg via INTRAVENOUS

## 2013-09-14 MED ORDER — LEUCOVORIN CALCIUM INJECTION 350 MG
400.0000 mg/m2 | Freq: Once | INTRAVENOUS | Status: AC
  Administered 2013-09-14: 732 mg via INTRAVENOUS
  Filled 2013-09-14: qty 36.6

## 2013-09-14 MED ORDER — DEXTROSE 5 % IV SOLN
Freq: Once | INTRAVENOUS | Status: AC
  Administered 2013-09-14: 13:00:00 via INTRAVENOUS

## 2013-09-14 MED ORDER — OXALIPLATIN CHEMO INJECTION 100 MG/20ML
68.0000 mg/m2 | Freq: Once | INTRAVENOUS | Status: AC
  Administered 2013-09-14: 125 mg via INTRAVENOUS
  Filled 2013-09-14: qty 25

## 2013-09-14 MED ORDER — ONDANSETRON 8 MG/NS 50 ML IVPB
INTRAVENOUS | Status: AC
  Filled 2013-09-14: qty 8

## 2013-09-14 NOTE — Patient Instructions (Signed)
[facility] Discharge Instructions for Patients Receiving Chemotherapy  Today you received the following chemotherapy agents: Oxaliplatin, Leucovorin, 5FU   To help prevent nausea and vomiting after your treatment, we encourage you to take your nausea medication as prescribed.    If you develop nausea and vomiting that is not controlled by your nausea medication, call the clinic.   BELOW ARE SYMPTOMS THAT SHOULD BE REPORTED IMMEDIATELY:  *FEVER GREATER THAN 100.5 F  *CHILLS WITH OR WITHOUT FEVER  NAUSEA AND VOMITING THAT IS NOT CONTROLLED WITH YOUR NAUSEA MEDICATION  *UNUSUAL SHORTNESS OF BREATH  *UNUSUAL BRUISING OR BLEEDING  TENDERNESS IN MOUTH AND THROAT WITH OR WITHOUT PRESENCE OF ULCERS  *URINARY PROBLEMS  *BOWEL PROBLEMS  UNUSUAL RASH Items with * indicate a potential emergency and should be followed up as soon as possible.  Feel free to call the clinic you have any questions or concerns. The clinic phone number is [phone].    

## 2013-09-14 NOTE — Progress Notes (Signed)
[facility] OFFICE PROGRESS NOTE  Patient Care Team: Angelica Ran, MD as PCP - General (Family Medicine) Windy Kalata, MD as Consulting Physician (Nephrology) Leatrice Jewels Rayetta Pigg, MD as Consulting Physician (Psychiatry) Heath Lark, MD as Consulting Physician (Hematology and Oncology)  DIAGNOSIS: Metastatic colon cancer to liver and omentum, ongoing palliative chemotherapy  SUMMARY OF ONCOLOGIC HISTORY: Oncology History   Colon cancer, KRAS positive   Primary site: Colon and Rectum (Right)   Staging method: AJCC 7th Edition   Clinical: (T4b, N2b, M1) signed by Heath Lark, MD on 08/05/2013  9:52 AM   Pathologic: Stage IVB (T4b, N2b, M1b) signed by Heath Lark, MD on 08/05/2013 10:21 AM   Summary: Stage IVB (T4b, N2b, M1b)      Colon cancer   07/15/2013 - 07/25/2013 Hospital Admission The patient presented to the hospital with pain in her abdomen and underwent surgery for colon cancer   07/15/2013 Imaging CT scan of the abdomen show bowel dilatation suspicious for malignancy   07/17/2013 Tumor Marker Preoperative CEA was 2.2, normal   07/19/2013 Procedure Colonoscopy revealed ascending colon lesion with significant stricture and biopsy showed adenocarcinoma   07/20/2013 Surgery She underwent right hemicolectomy, cholecystectomy, liver biopsy, excision of abdomen no wall mass and omental mass. During surgery, she was also found to have drop metastasis   08/29/2013 Imaging Repeat CT scan show persistent liver metastasis and new omental metastasis   08/31/2013 -  Chemotherapy The patient was started on palliative chemotherapy with FOLFOX, bolus 5-FU committed and oxaliplatin dose reduced due to  peripheral neuropathy    INTERVAL HISTORY: Monica [name] 53 y.o. female returns for further followup. She has chemotherapy 2 weeks ago and tolerated that well. She had mild nausea but no vomiting. Denies any worsening peripheral neuropathy. She has uncontrolled high  blood sugar with the chemotherapy and her primary care provider is adjusting the dosage. She denies any recent fever, chills, night sweats or abnormal weight loss   I have reviewed the past medical history, past surgical history, social history and family history with the patient and they are unchanged from previous note.  ALLERGIES:  has No Known Allergies.  MEDICATIONS:  Current Outpatient Prescriptions  Medication Sig Dispense Refill  . ACCU-CHEK AVIVA PLUS test strip USE AS DIRECTED  100 each  10  . atorvastatin (LIPITOR) 40 MG tablet Take 1 tablet (40 mg total) by mouth daily.  90 tablet  3  . buprenorphine-naloxone (SUBOXONE) 8-2 MG SUBL SL tablet Place 1 tablet under the tongue 3 (three) times daily.      . clonazePAM (KLONOPIN) 1 MG tablet Take 1 mg by mouth 3 (three) times daily as needed for anxiety.       . diazepam (VALIUM) 10 MG tablet Take 10 mg by mouth 3 (three) times daily.      Marland Kitchen docusate sodium (COLACE) 100 MG capsule TAKE ONE CAPSULE BY MOUTH TWICE A DAY  60 capsule  5  . furosemide (LASIX) 40 MG tablet Take 1 tablet (40 mg total) by mouth 2 (two) times daily.  60 tablet  5  . gabapentin (NEURONTIN) 300 MG capsule Take 2 capsules (600 mg total) by mouth 2 (two) times daily.  120 capsule  5  . HUMALOG 100 UNIT/ML injection INJECT 2-8 UNITS INTO THE SKIN DAILY. PT USES SLIDING SCALE  10 mL  3  . hydrOXYzine (ATARAX/VISTARIL) 25 MG tablet Take 50 mg by mouth 3 (three) times daily as needed for anxiety.      Marland Kitchen  ibuprofen (ADVIL,MOTRIN) 800 MG tablet Take 800 mg by mouth every 8 (eight) hours as needed for fever, headache, mild pain or moderate pain.      Marland Kitchen insulin glargine (LANTUS) 100 UNIT/ML injection Inject 25 Units into the skin daily.       Marland Kitchen lamoTRIgine (LAMICTAL) 100 MG tablet Take 100 mg by mouth 2 (two) times daily.      Marland Kitchen lidocaine-prilocaine (EMLA) cream Apply 1 application topically as needed (for pain).      Marland Kitchen omeprazole (PRILOSEC) 20 MG capsule Take 20 mg by  mouth daily.      . ondansetron (ZOFRAN) 8 MG tablet Take 1 tablet (8 mg total) by mouth every 8 (eight) hours as needed.  30 tablet  1  . OVER THE COUNTER MEDICATION Apply 1 application to eye daily.      . polyethylene glycol (MIRALAX / GLYCOLAX) packet Take 17 g by mouth 2 (two) times daily.       . prochlorperazine (COMPAZINE) 10 MG tablet Take 1 tablet (10 mg total) by mouth every 6 (six) hours as needed (Nausea or vomiting).  30 tablet  1  . ramelteon (ROZEREM) 8 MG tablet Take 8 mg by mouth at bedtime.      . ziprasidone (GEODON) 80 MG capsule Take 80 mg by mouth at bedtime.        No current facility-administered medications for this visit.    REVIEW OF SYSTEMS:   Constitutional: Denies fevers, chills or abnormal weight loss Eyes: Denies blurriness of vision Ears, nose, mouth, throat, and face: Denies mucositis or sore throat Respiratory: Denies cough, dyspnea or wheezes Cardiovascular: Denies palpitation, chest discomfort or lower extremity swelling Skin: Denies abnormal skin rashes Lymphatics: Denies new lymphadenopathy or easy bruising Neurological:Denies numbness, tingling or new weaknesses Behavioral/Psych: Mood is stable, no new changes  All other systems were reviewed with the patient and are negative.  PHYSICAL EXAMINATION: ECOG PERFORMANCE STATUS: 1 - Symptomatic but completely ambulatory  Filed Vitals:   09/14/13 1052  BP: 99/56  Pulse: 65  Temp: 97.8 F (36.6 C)  Resp: 20   Filed Weights   09/14/13 1052  Weight: 169 lb 8 oz (76.885 kg)    GENERAL:alert, no distress and comfortable. She looks thin SKIN: skin color, texture, turgor are normal, no rashes or significant lesions EYES: normal, Conjunctiva are pink and non-injected, sclera clear OROPHARYNX:no exudate, no erythema and lips, buccal mucosa, and tongue normal  NECK: supple, thyroid normal size, non-tender, without nodularity LYMPH:  no palpable lymphadenopathy in the cervical, axillary or  inguinal LUNGS: clear to auscultation and percussion with normal breathing effort HEART: regular rate & rhythm and no murmurs and no lower extremity edema ABDOMEN:abdomen soft, non-tender and normal bowel sounds Musculoskeletal:no cyanosis of digits and no clubbing  NEURO: alert & oriented x 3 with fluent speech, no focal motor/sensory deficits  LABORATORY DATA:  I have reviewed the data as listed    Component Value Date/Time   NA 143 09/14/2013 1037   NA 143 08/12/2013 1005   K 3.9 09/14/2013 1037   K 4.2 08/12/2013 1005   CL 105 08/12/2013 1005   CO2 30* 09/14/2013 1037   CO2 27 08/12/2013 1005   GLUCOSE 154* 09/14/2013 1037   GLUCOSE 95 08/12/2013 1005   BUN 15.2 09/14/2013 1037   BUN 13 08/12/2013 1005   CREATININE 1.2* 09/14/2013 1037   CREATININE 1.14* 08/12/2013 1005   CREATININE 1.38* 05/26/2013 1703   CALCIUM 9.3 09/14/2013 1037  CALCIUM 8.7 08/12/2013 1005   PROT 6.9 09/14/2013 1037   PROT 6.4 08/12/2013 1005   ALBUMIN 3.4* 09/14/2013 1037   ALBUMIN 2.9* 08/12/2013 1005   AST 29 09/14/2013 1037   AST 22 08/12/2013 1005   ALT 29 09/14/2013 1037   ALT 23 08/12/2013 1005   ALKPHOS 128 09/14/2013 1037   ALKPHOS 199* 08/12/2013 1005   BILITOT 0.24 09/14/2013 1037   BILITOT 0.2* 08/12/2013 1005   GFRNONAA 55* 08/12/2013 1005   GFRAA 63* 08/12/2013 1005    No results found for this basename: SPEP, UPEP,  kappa and lambda light chains    Lab Results  Component Value Date   WBC 3.3* 09/14/2013   NEUTROABS 1.9 09/14/2013   HGB 11.7 09/14/2013   HCT 35.4 09/14/2013   MCV 83.7 09/14/2013   PLT 157 09/14/2013      Chemistry      Component Value Date/Time   NA 143 09/14/2013 1037   NA 143 08/12/2013 1005   K 3.9 09/14/2013 1037   K 4.2 08/12/2013 1005   CL 105 08/12/2013 1005   CO2 30* 09/14/2013 1037   CO2 27 08/12/2013 1005   BUN 15.2 09/14/2013 1037   BUN 13 08/12/2013 1005   CREATININE 1.2* 09/14/2013 1037   CREATININE 1.14* 08/12/2013 1005   CREATININE 1.38* 05/26/2013 1703      Component Value Date/Time    CALCIUM 9.3 09/14/2013 1037   CALCIUM 8.7 08/12/2013 1005   ALKPHOS 128 09/14/2013 1037   ALKPHOS 199* 08/12/2013 1005   AST 29 09/14/2013 1037   AST 22 08/12/2013 1005   ALT 29 09/14/2013 1037   ALT 23 08/12/2013 1005   BILITOT 0.24 09/14/2013 1037   BILITOT 0.2* 08/12/2013 1005     ASSESSMENT & PLAN:  #1 metastatic colon cancer She tolerated treatment well. I will continue without dose adjustment. I plan to give her 3 months of treatment before repeating CT scan #2 leukopenia This is likely due to recent treatment. The patient denies recent history of fevers, cough, chills, diarrhea or dysuria. She is asymptomatic from the leukopenia. I will observe for now.  I will continue the chemotherapy at current dose without dosage adjustment.  If the leukopenia gets progressive worse in the future, I might have to delay her treatment or adjust the chemotherapy dose. #3 diabetes She will continue insulin adjustments as directed by her primary care provider #4 hypotension I suspect the patient is dehydrated. Her kidney function is stable. I recommended patient to reduce the dose of Lasix within the first few days of chemotherapy #5 chronic kidney disease This is stable and not worse #6 tobacco abuse The patient will continue her effort to stop smoking All questions were answered. The patient knows to call the clinic with any problems, questions or concerns. No barriers to learning was detected. I spent 25 minutes counseling the patient face to face. The total time spent in the appointment was 40 minutes and more than 50% was on counseling and review of test results     [facility], [place], MD 09/14/2013 11:33 AM

## 2013-09-14 NOTE — Telephone Encounter (Signed)
gv adn pritned appt sched and avs forpt for Feb and March.....sed added tx.

## 2013-09-16 ENCOUNTER — Ambulatory Visit (HOSPITAL_BASED_OUTPATIENT_CLINIC_OR_DEPARTMENT_OTHER): Payer: Medicaid Other

## 2013-09-16 VITALS — BP 102/65 | HR 63 | Temp 98.2°F

## 2013-09-16 DIAGNOSIS — C189 Malignant neoplasm of colon, unspecified: Secondary | ICD-10-CM

## 2013-09-16 DIAGNOSIS — C786 Secondary malignant neoplasm of retroperitoneum and peritoneum: Secondary | ICD-10-CM

## 2013-09-16 DIAGNOSIS — C182 Malignant neoplasm of ascending colon: Secondary | ICD-10-CM

## 2013-09-16 MED ORDER — HEPARIN SOD (PORK) LOCK FLUSH 100 UNIT/ML IV SOLN
500.0000 [IU] | Freq: Once | INTRAVENOUS | Status: AC | PRN
  Administered 2013-09-16: 500 [IU]
  Filled 2013-09-16: qty 5

## 2013-09-16 MED ORDER — SODIUM CHLORIDE 0.9 % IJ SOLN
10.0000 mL | INTRAMUSCULAR | Status: DC | PRN
  Administered 2013-09-16: 10 mL
  Filled 2013-09-16: qty 10

## 2013-09-19 ENCOUNTER — Other Ambulatory Visit (HOSPITAL_COMMUNITY): Payer: Self-pay | Admitting: Emergency Medicine

## 2013-09-28 ENCOUNTER — Ambulatory Visit: Payer: Medicaid Other

## 2013-09-28 ENCOUNTER — Telehealth: Payer: Self-pay | Admitting: Hematology and Oncology

## 2013-09-28 ENCOUNTER — Telehealth: Payer: Self-pay | Admitting: Internal Medicine

## 2013-09-28 ENCOUNTER — Ambulatory Visit (HOSPITAL_BASED_OUTPATIENT_CLINIC_OR_DEPARTMENT_OTHER): Payer: Medicaid Other | Admitting: Hematology and Oncology

## 2013-09-28 ENCOUNTER — Inpatient Hospital Stay: Payer: Self-pay

## 2013-09-28 ENCOUNTER — Encounter: Payer: Self-pay | Admitting: Hematology and Oncology

## 2013-09-28 ENCOUNTER — Ambulatory Visit (HOSPITAL_BASED_OUTPATIENT_CLINIC_OR_DEPARTMENT_OTHER): Payer: Medicaid Other

## 2013-09-28 VITALS — BP 112/67 | HR 64 | Temp 97.6°F | Resp 19 | Ht 65.0 in | Wt 171.1 lb

## 2013-09-28 DIAGNOSIS — D72819 Decreased white blood cell count, unspecified: Secondary | ICD-10-CM

## 2013-09-28 DIAGNOSIS — C182 Malignant neoplasm of ascending colon: Secondary | ICD-10-CM

## 2013-09-28 DIAGNOSIS — D6959 Other secondary thrombocytopenia: Secondary | ICD-10-CM | POA: Insufficient documentation

## 2013-09-28 DIAGNOSIS — N183 Chronic kidney disease, stage 3 unspecified: Secondary | ICD-10-CM

## 2013-09-28 DIAGNOSIS — C189 Malignant neoplasm of colon, unspecified: Secondary | ICD-10-CM

## 2013-09-28 DIAGNOSIS — T451X5A Adverse effect of antineoplastic and immunosuppressive drugs, initial encounter: Secondary | ICD-10-CM

## 2013-09-28 DIAGNOSIS — Z5111 Encounter for antineoplastic chemotherapy: Secondary | ICD-10-CM

## 2013-09-28 DIAGNOSIS — T50905A Adverse effect of unspecified drugs, medicaments and biological substances, initial encounter: Secondary | ICD-10-CM

## 2013-09-28 DIAGNOSIS — F17200 Nicotine dependence, unspecified, uncomplicated: Secondary | ICD-10-CM

## 2013-09-28 DIAGNOSIS — D701 Agranulocytosis secondary to cancer chemotherapy: Secondary | ICD-10-CM

## 2013-09-28 DIAGNOSIS — E109 Type 1 diabetes mellitus without complications: Secondary | ICD-10-CM

## 2013-09-28 HISTORY — DX: Adverse effect of antineoplastic and immunosuppressive drugs, initial encounter: T45.1X5A

## 2013-09-28 HISTORY — DX: Other secondary thrombocytopenia: D69.59

## 2013-09-28 HISTORY — DX: Adverse effect of antineoplastic and immunosuppressive drugs, initial encounter: D70.1

## 2013-09-28 LAB — CBC & DIFF AND RETIC
BASO%: 0.7 % (ref 0.0–2.0)
Basophils Absolute: 0 10*3/uL (ref 0.0–0.1)
EOS%: 3 % (ref 0.0–7.0)
Eosinophils Absolute: 0.1 10*3/uL (ref 0.0–0.5)
HCT: 35.3 % (ref 34.8–46.6)
HGB: 11.6 g/dL (ref 11.6–15.9)
IMMATURE RETIC FRACT: 1.1 % — AB (ref 1.60–10.00)
LYMPH#: 1.3 10*3/uL (ref 0.9–3.3)
LYMPH%: 43.4 % (ref 14.0–49.7)
MCH: 27.6 pg (ref 25.1–34.0)
MCHC: 32.9 g/dL (ref 31.5–36.0)
MCV: 84 fL (ref 79.5–101.0)
MONO#: 0.4 10*3/uL (ref 0.1–0.9)
MONO%: 12.9 % (ref 0.0–14.0)
NEUT#: 1.2 10*3/uL — ABNORMAL LOW (ref 1.5–6.5)
NEUT%: 40 % (ref 38.4–76.8)
Platelets: 125 10*3/uL — ABNORMAL LOW (ref 145–400)
RBC: 4.2 10*6/uL (ref 3.70–5.45)
RDW: 14.9 % — ABNORMAL HIGH (ref 11.2–14.5)
Retic %: 0.72 % (ref 0.70–2.10)
Retic Ct Abs: 30.24 10*3/uL — ABNORMAL LOW (ref 33.70–90.70)
WBC: 3 10*3/uL — ABNORMAL LOW (ref 3.9–10.3)

## 2013-09-28 LAB — COMPREHENSIVE METABOLIC PANEL (CC13)
ALT: 32 U/L (ref 0–55)
ANION GAP: 8 meq/L (ref 3–11)
AST: 32 U/L (ref 5–34)
Albumin: 3.5 g/dL (ref 3.5–5.0)
Alkaline Phosphatase: 117 U/L (ref 40–150)
BUN: 11.8 mg/dL (ref 7.0–26.0)
CALCIUM: 9.1 mg/dL (ref 8.4–10.4)
CHLORIDE: 103 meq/L (ref 98–109)
CO2: 31 meq/L — AB (ref 22–29)
Creatinine: 1.2 mg/dL — ABNORMAL HIGH (ref 0.6–1.1)
Glucose: 189 mg/dl — ABNORMAL HIGH (ref 70–140)
Potassium: 4.2 mEq/L (ref 3.5–5.1)
SODIUM: 143 meq/L (ref 136–145)
Total Bilirubin: 0.31 mg/dL (ref 0.20–1.20)
Total Protein: 6.7 g/dL (ref 6.4–8.3)

## 2013-09-28 MED ORDER — ONDANSETRON 8 MG/NS 50 ML IVPB
INTRAVENOUS | Status: AC
  Filled 2013-09-28: qty 8

## 2013-09-28 MED ORDER — SODIUM CHLORIDE 0.9 % IV SOLN
2400.0000 mg/m2 | INTRAVENOUS | Status: DC
  Administered 2013-09-28: 4150 mg via INTRAVENOUS
  Filled 2013-09-28: qty 83

## 2013-09-28 MED ORDER — DEXAMETHASONE SODIUM PHOSPHATE 10 MG/ML IJ SOLN
INTRAMUSCULAR | Status: AC
  Filled 2013-09-28: qty 1

## 2013-09-28 MED ORDER — OXALIPLATIN CHEMO INJECTION 100 MG/20ML
68.0000 mg/m2 | Freq: Once | INTRAVENOUS | Status: AC
  Administered 2013-09-28: 120 mg via INTRAVENOUS
  Filled 2013-09-28: qty 24

## 2013-09-28 MED ORDER — LEUCOVORIN CALCIUM INJECTION 350 MG
405.0000 mg/m2 | Freq: Once | INTRAVENOUS | Status: AC
  Administered 2013-09-28: 700 mg via INTRAVENOUS
  Filled 2013-09-28: qty 35

## 2013-09-28 MED ORDER — DEXAMETHASONE SODIUM PHOSPHATE 10 MG/ML IJ SOLN
10.0000 mg | Freq: Once | INTRAMUSCULAR | Status: AC
  Administered 2013-09-28: 10 mg via INTRAVENOUS

## 2013-09-28 MED ORDER — ONDANSETRON 8 MG/50ML IVPB (CHCC): 8.0000 mg | Freq: Once | INTRAVENOUS | Status: DC

## 2013-09-28 MED ORDER — SODIUM CHLORIDE 0.9 % IJ SOLN
10.0000 mL | INTRAMUSCULAR | Status: DC | PRN
  Filled 2013-09-28: qty 10

## 2013-09-28 MED ORDER — ALTEPLASE 2 MG IJ SOLR
2.0000 mg | Freq: Once | INTRAMUSCULAR | Status: DC | PRN
  Filled 2013-09-28: qty 2

## 2013-09-28 MED ORDER — HEPARIN SOD (PORK) LOCK FLUSH 100 UNIT/ML IV SOLN
250.0000 [IU] | Freq: Once | INTRAVENOUS | Status: DC | PRN
  Filled 2013-09-28: qty 5

## 2013-09-28 MED ORDER — SODIUM CHLORIDE 0.9 % IJ SOLN
3.0000 mL | INTRAMUSCULAR | Status: DC | PRN
  Filled 2013-09-28: qty 10

## 2013-09-28 MED ORDER — HEPARIN SOD (PORK) LOCK FLUSH 100 UNIT/ML IV SOLN
500.0000 [IU] | Freq: Once | INTRAVENOUS | Status: DC | PRN
  Filled 2013-09-28: qty 5

## 2013-09-28 MED ORDER — DEXTROSE 5 % IV SOLN: Freq: Once | INTRAVENOUS | Status: DC

## 2013-09-28 NOTE — Telephone Encounter (Signed)
pt went to chemo and will give her schedfule when she finis, emailed Shelby regarding  chemo adjustment

## 2013-09-28 NOTE — Progress Notes (Signed)
[facility] OFFICE PROGRESS NOTE  Patient Care Team: Angelica Ran, MD as PCP - General (Family Medicine) Windy Kalata, MD as Consulting Physician (Nephrology) Leatrice Jewels Rayetta Pigg, MD as Consulting Physician (Psychiatry) Heath Lark, MD as Consulting Physician (Hematology and Oncology)  DIAGNOSIS: Metastatic colon cancer, seen prior to cycle 3 of chemotherapy  SUMMARY OF ONCOLOGIC HISTORY: Oncology History   Colon cancer, KRAS positive   Primary site: Colon and Rectum (Right)   Staging method: AJCC 7th Edition   Clinical: (T4b, N2b, M1) signed by Heath Lark, MD on 08/05/2013  9:52 AM   Pathologic: Stage IVB (T4b, N2b, M1b) signed by Heath Lark, MD on 08/05/2013 10:21 AM   Summary: Stage IVB (T4b, N2b, M1b)      Colon cancer   07/15/2013 - 07/25/2013 Hospital Admission The patient presented to the hospital with pain in her abdomen and underwent surgery for colon cancer   07/15/2013 Imaging CT scan of the abdomen show bowel dilatation suspicious for malignancy   07/17/2013 Tumor Marker Preoperative CEA was 2.2, normal   07/19/2013 Procedure Colonoscopy revealed ascending colon lesion with significant stricture and biopsy showed adenocarcinoma   07/20/2013 Surgery She underwent right hemicolectomy, cholecystectomy, liver biopsy, excision of abdomen no wall mass and omental mass. During surgery, she was also found to have drop metastasis   08/29/2013 Imaging Repeat CT scan show persistent liver metastasis and new omental metastasis   08/31/2013 -  Chemotherapy The patient was started on palliative chemotherapy with FOLFOX, bolus 5-FU committed and oxaliplatin dose reduced due to  peripheral neuropathy    INTERVAL HISTORY: Monica [name] 53 y.o. female returns for further followup. She is concerned about excessive weight gain. She denies severe hyperglycemia recently. She has persistent peripheral neuropathy, slightly worse recently with the cold weather. She  has some mild nausea but no vomiting.  I have reviewed the past medical history, past surgical history, social history and family history with the patient and they are unchanged from previous note.  ALLERGIES:  has No Known Allergies.  MEDICATIONS:  Current Outpatient Prescriptions  Medication Sig Dispense Refill  . ACCU-CHEK AVIVA PLUS test strip USE AS DIRECTED  100 each  10  . atorvastatin (LIPITOR) 40 MG tablet Take 1 tablet (40 mg total) by mouth daily.  90 tablet  3  . buprenorphine-naloxone (SUBOXONE) 8-2 MG SUBL SL tablet Place 1 tablet under the tongue 3 (three) times daily.      . clonazePAM (KLONOPIN) 1 MG tablet Take 1 mg by mouth 3 (three) times daily as needed for anxiety.       . diazepam (VALIUM) 10 MG tablet Take 10 mg by mouth 3 (three) times daily.      Marland Kitchen docusate sodium (COLACE) 100 MG capsule TAKE ONE CAPSULE BY MOUTH TWICE A DAY  60 capsule  5  . furosemide (LASIX) 40 MG tablet Take 1 tablet (40 mg total) by mouth 2 (two) times daily.  60 tablet  5  . gabapentin (NEURONTIN) 300 MG capsule Take 2 capsules (600 mg total) by mouth 2 (two) times daily.  120 capsule  5  . HUMALOG 100 UNIT/ML injection INJECT 2-8 UNITS INTO THE SKIN DAILY. PT USES SLIDING SCALE  10 mL  3  . hydrOXYzine (ATARAX/VISTARIL) 25 MG tablet Take 50 mg by mouth 3 (three) times daily as needed for anxiety.      Marland Kitchen ibuprofen (ADVIL,MOTRIN) 800 MG tablet Take 800 mg by mouth every 8 (eight) hours as needed  for fever, headache, mild pain or moderate pain.      Marland Kitchen insulin glargine (LANTUS) 100 UNIT/ML injection Inject 25 Units into the skin daily.       Marland Kitchen lamoTRIgine (LAMICTAL) 100 MG tablet Take 100 mg by mouth 2 (two) times daily.      Marland Kitchen lidocaine-prilocaine (EMLA) cream Apply 1 application topically as needed (for pain).      Marland Kitchen omeprazole (PRILOSEC) 20 MG capsule Take 20 mg by mouth daily.      . ondansetron (ZOFRAN) 8 MG tablet Take 1 tablet (8 mg total) by mouth every 8 (eight) hours as needed.  30  tablet  1  . OVER THE COUNTER MEDICATION Apply 1 application to eye daily.      . polyethylene glycol powder (GLYCOLAX/MIRALAX) powder USE AS DIRECTED  255 g  3  . prochlorperazine (COMPAZINE) 10 MG tablet Take 1 tablet (10 mg total) by mouth every 6 (six) hours as needed (Nausea or vomiting).  30 tablet  1  . ramelteon (ROZEREM) 8 MG tablet Take 8 mg by mouth at bedtime.      . ziprasidone (GEODON) 80 MG capsule Take 80 mg by mouth at bedtime.        No current facility-administered medications for this visit.    REVIEW OF SYSTEMS:   Constitutional: Denies fevers, chills or abnormal weight loss Eyes: Denies blurriness of vision Ears, nose, mouth, throat, and face: Denies mucositis or sore throat Respiratory: Denies cough, dyspnea or wheezes Cardiovascular: Denies palpitation, chest discomfort or lower extremity swelling Gastrointestinal:  Denies nausea, heartburn or change in bowel habits Skin: Denies abnormal skin rashes Neurological:Denies numbness, tingling or new weaknesses Behavioral/Psych: Mood is stable, no new changes  All other systems were reviewed with the patient and are negative.  PHYSICAL EXAMINATION: ECOG PERFORMANCE STATUS: 1 - Symptomatic but completely ambulatory  Filed Vitals:   09/28/13 0956  BP: 112/67  Pulse: 64  Temp: 97.6 F (36.4 C)  Resp: 19   Filed Weights   09/28/13 0956  Weight: 171 lb 1.6 oz (77.61 kg)    GENERAL:alert, no distress and comfortable SKIN: skin color, texture, turgor are normal, no rashes or significant lesions EYES: normal, Conjunctiva are pink and non-injected, sclera clear OROPHARYNX:no exudate, no erythema and lips, buccal mucosa, and tongue normal  NECK: supple, thyroid normal size, non-tender, without nodularity LYMPH:  no palpable lymphadenopathy in the cervical, axillary or inguinal LUNGS: clear to auscultation and percussion with normal breathing effort HEART: regular rate & rhythm and no murmurs and no lower extremity  edema ABDOMEN:abdomen soft, non-tender and normal bowel sounds Musculoskeletal:no cyanosis of digits and no clubbing  NEURO: alert & oriented x 3 with fluent speech, no focal motor/sensory deficits  LABORATORY DATA:  I have reviewed the data as listed    Component Value Date/Time   NA 143 09/14/2013 1037   NA 143 08/12/2013 1005   K 3.9 09/14/2013 1037   K 4.2 08/12/2013 1005   CL 105 08/12/2013 1005   CO2 30* 09/14/2013 1037   CO2 27 08/12/2013 1005   GLUCOSE 154* 09/14/2013 1037   GLUCOSE 95 08/12/2013 1005   BUN 15.2 09/14/2013 1037   BUN 13 08/12/2013 1005   CREATININE 1.2* 09/14/2013 1037   CREATININE 1.14* 08/12/2013 1005   CREATININE 1.38* 05/26/2013 1703   CALCIUM 9.3 09/14/2013 1037   CALCIUM 8.7 08/12/2013 1005   PROT 6.9 09/14/2013 1037   PROT 6.4 08/12/2013 1005   ALBUMIN 3.4* 09/14/2013 1037  ALBUMIN 2.9* 08/12/2013 1005   AST 29 09/14/2013 1037   AST 22 08/12/2013 1005   ALT 29 09/14/2013 1037   ALT 23 08/12/2013 1005   ALKPHOS 128 09/14/2013 1037   ALKPHOS 199* 08/12/2013 1005   BILITOT 0.24 09/14/2013 1037   BILITOT 0.2* 08/12/2013 1005   GFRNONAA 55* 08/12/2013 1005   GFRAA 63* 08/12/2013 1005    No results found for this basename: SPEP,  UPEP,   kappa and lambda light chains    Lab Results  Component Value Date   WBC 3.0* 09/28/2013   NEUTROABS 1.2* 09/28/2013   HGB 11.6 09/28/2013   HCT 35.3 09/28/2013   MCV 84.0 09/28/2013   PLT 125* 09/28/2013      Chemistry      Component Value Date/Time   NA 143 09/14/2013 1037   NA 143 08/12/2013 1005   K 3.9 09/14/2013 1037   K 4.2 08/12/2013 1005   CL 105 08/12/2013 1005   CO2 30* 09/14/2013 1037   CO2 27 08/12/2013 1005   BUN 15.2 09/14/2013 1037   BUN 13 08/12/2013 1005   CREATININE 1.2* 09/14/2013 1037   CREATININE 1.14* 08/12/2013 1005   CREATININE 1.38* 05/26/2013 1703      Component Value Date/Time   CALCIUM 9.3 09/14/2013 1037   CALCIUM 8.7 08/12/2013 1005   ALKPHOS 128 09/14/2013 1037   ALKPHOS 199* 08/12/2013 1005   AST 29 09/14/2013 1037   AST  22 08/12/2013 1005   ALT 29 09/14/2013 1037   ALT 23 08/12/2013 1005   BILITOT 0.24 09/14/2013 1037   BILITOT 0.2* 08/12/2013 1005     ASSESSMENT & PLAN:  #1 metastatic colon cancer She tolerated treatment well. I will continue with slight dose adjustment due to extra weight gain. I plan to give her 3 months of treatment before repeating CT scan #2 leukopenia This is likely due to recent treatment. The patient denies recent history of fevers, cough, chills, diarrhea or dysuria. She is asymptomatic from the leukopenia. I will observe for now.  I will continue the chemotherapy at current dose without dosage adjustment.  If the leukopenia gets progressive worse in the future, I might have to delay her treatment or adjust the chemotherapy dose. #3 diabetes She will continue insulin adjustments as directed by her primary care provider #4 chronic kidney disease This is stable and not worse #5 tobacco abuse The patient will continue her effort to stop smoking #6 mild thrombocytopenia This is likely due to recent treatment. The patient denies recent history of bleeding such as epistaxis, hematuria or hematochezia. She is asymptomatic from the low platelet count. I will observe for now.  she does not require transfusion now. I will continue the chemotherapy at current dose without dosage adjustment.  If the thrombocytopenia gets progressive worse in the future, I might have to delay her treatment or adjust the chemotherapy dose.  All questions were answered. The patient knows to call the clinic with any problems, questions or concerns. No barriers to learning was detected.    [place], [place], MD 09/28/2013 10:21 AM

## 2013-09-28 NOTE — Telephone Encounter (Signed)
Called pt today and notified her that chemo dont need to rbr adjusted , she has all the appt for [facility] 2015

## 2013-09-28 NOTE — Telephone Encounter (Signed)
incomplete pt went to chemo

## 2013-09-30 ENCOUNTER — Ambulatory Visit (HOSPITAL_BASED_OUTPATIENT_CLINIC_OR_DEPARTMENT_OTHER): Payer: Medicaid Other

## 2013-09-30 VITALS — BP 112/65 | HR 74 | Temp 98.1°F

## 2013-09-30 DIAGNOSIS — C182 Malignant neoplasm of ascending colon: Secondary | ICD-10-CM

## 2013-09-30 DIAGNOSIS — C189 Malignant neoplasm of colon, unspecified: Secondary | ICD-10-CM

## 2013-09-30 MED ORDER — HEPARIN SOD (PORK) LOCK FLUSH 100 UNIT/ML IV SOLN
500.0000 [IU] | Freq: Once | INTRAVENOUS | Status: AC | PRN
  Administered 2013-09-30: 500 [IU]
  Filled 2013-09-30: qty 5

## 2013-09-30 MED ORDER — SODIUM CHLORIDE 0.9 % IJ SOLN
10.0000 mL | INTRAMUSCULAR | Status: DC | PRN
  Administered 2013-09-30: 10 mL
  Filled 2013-09-30: qty 10

## 2013-10-12 ENCOUNTER — Encounter: Payer: Self-pay | Admitting: Hematology and Oncology

## 2013-10-12 ENCOUNTER — Telehealth: Payer: Self-pay | Admitting: Hematology and Oncology

## 2013-10-12 ENCOUNTER — Other Ambulatory Visit: Payer: Self-pay | Admitting: *Deleted

## 2013-10-12 ENCOUNTER — Ambulatory Visit (HOSPITAL_BASED_OUTPATIENT_CLINIC_OR_DEPARTMENT_OTHER): Payer: Medicaid Other

## 2013-10-12 ENCOUNTER — Ambulatory Visit (HOSPITAL_BASED_OUTPATIENT_CLINIC_OR_DEPARTMENT_OTHER): Payer: Medicaid Other | Admitting: Hematology and Oncology

## 2013-10-12 ENCOUNTER — Other Ambulatory Visit (HOSPITAL_BASED_OUTPATIENT_CLINIC_OR_DEPARTMENT_OTHER): Payer: Medicaid Other

## 2013-10-12 VITALS — BP 123/73 | HR 65 | Temp 97.1°F | Resp 18 | Ht 65.0 in | Wt 168.7 lb

## 2013-10-12 DIAGNOSIS — Z5111 Encounter for antineoplastic chemotherapy: Secondary | ICD-10-CM | POA: Diagnosis not present

## 2013-10-12 DIAGNOSIS — C787 Secondary malignant neoplasm of liver and intrahepatic bile duct: Secondary | ICD-10-CM | POA: Diagnosis not present

## 2013-10-12 DIAGNOSIS — C182 Malignant neoplasm of ascending colon: Secondary | ICD-10-CM | POA: Diagnosis not present

## 2013-10-12 DIAGNOSIS — N183 Chronic kidney disease, stage 3 unspecified: Secondary | ICD-10-CM

## 2013-10-12 DIAGNOSIS — C786 Secondary malignant neoplasm of retroperitoneum and peritoneum: Secondary | ICD-10-CM | POA: Diagnosis not present

## 2013-10-12 DIAGNOSIS — C189 Malignant neoplasm of colon, unspecified: Secondary | ICD-10-CM

## 2013-10-12 DIAGNOSIS — N189 Chronic kidney disease, unspecified: Secondary | ICD-10-CM

## 2013-10-12 DIAGNOSIS — G609 Hereditary and idiopathic neuropathy, unspecified: Secondary | ICD-10-CM

## 2013-10-12 DIAGNOSIS — E1340 Other specified diabetes mellitus with diabetic neuropathy, unspecified: Secondary | ICD-10-CM

## 2013-10-12 DIAGNOSIS — E109 Type 1 diabetes mellitus without complications: Secondary | ICD-10-CM

## 2013-10-12 DIAGNOSIS — F17200 Nicotine dependence, unspecified, uncomplicated: Secondary | ICD-10-CM

## 2013-10-12 LAB — COMPREHENSIVE METABOLIC PANEL (CC13)
ALT: 33 U/L (ref 0–55)
AST: 41 U/L — ABNORMAL HIGH (ref 5–34)
Albumin: 3.6 g/dL (ref 3.5–5.0)
Alkaline Phosphatase: 118 U/L (ref 40–150)
Anion Gap: 13 mEq/L — ABNORMAL HIGH (ref 3–11)
BUN: 10.5 mg/dL (ref 7.0–26.0)
CALCIUM: 9.8 mg/dL (ref 8.4–10.4)
CHLORIDE: 106 meq/L (ref 98–109)
CO2: 30 meq/L — AB (ref 22–29)
Creatinine: 1.3 mg/dL — ABNORMAL HIGH (ref 0.6–1.1)
Potassium: 3.7 mEq/L (ref 3.5–5.1)
Sodium: 150 mEq/L — ABNORMAL HIGH (ref 136–145)
Total Bilirubin: 0.26 mg/dL (ref 0.20–1.20)
Total Protein: 6.9 g/dL (ref 6.4–8.3)

## 2013-10-12 LAB — CBC & DIFF AND RETIC
BASO%: 0.8 % (ref 0.0–2.0)
BASOS ABS: 0 10*3/uL (ref 0.0–0.1)
EOS ABS: 0.1 10*3/uL (ref 0.0–0.5)
EOS%: 3.1 % (ref 0.0–7.0)
HEMATOCRIT: 37.5 % (ref 34.8–46.6)
HGB: 12.5 g/dL (ref 11.6–15.9)
IMMATURE RETIC FRACT: 2.2 % (ref 1.60–10.00)
LYMPH#: 1.4 10*3/uL (ref 0.9–3.3)
LYMPH%: 36.5 % (ref 14.0–49.7)
MCH: 28 pg (ref 25.1–34.0)
MCHC: 33.3 g/dL (ref 31.5–36.0)
MCV: 84.1 fL (ref 79.5–101.0)
MONO#: 0.6 10*3/uL (ref 0.1–0.9)
MONO%: 14.9 % — ABNORMAL HIGH (ref 0.0–14.0)
NEUT%: 44.7 % (ref 38.4–76.8)
NEUTROS ABS: 1.7 10*3/uL (ref 1.5–6.5)
Platelets: 142 10*3/uL — ABNORMAL LOW (ref 145–400)
RBC: 4.46 10*6/uL (ref 3.70–5.45)
RDW: 15.3 % — AB (ref 11.2–14.5)
RETIC CT ABS: 45.49 10*3/uL (ref 33.70–90.70)
Retic %: 1.02 % (ref 0.70–2.10)
WBC: 3.9 10*3/uL (ref 3.9–10.3)

## 2013-10-12 MED ORDER — ONDANSETRON 8 MG/50ML IVPB (CHCC)
8.0000 mg | Freq: Once | INTRAVENOUS | Status: AC
  Administered 2013-10-12: 8 mg via INTRAVENOUS

## 2013-10-12 MED ORDER — DEXTROSE 5 % IV SOLN
68.0000 mg/m2 | Freq: Once | INTRAVENOUS | Status: AC
  Administered 2013-10-12: 120 mg via INTRAVENOUS
  Filled 2013-10-12: qty 24

## 2013-10-12 MED ORDER — SODIUM CHLORIDE 0.9 % IV SOLN
2220.0000 mg/m2 | INTRAVENOUS | Status: DC
  Administered 2013-10-12: 4200 mg via INTRAVENOUS
  Filled 2013-10-12: qty 84

## 2013-10-12 MED ORDER — DEXAMETHASONE SODIUM PHOSPHATE 10 MG/ML IJ SOLN
10.0000 mg | Freq: Once | INTRAMUSCULAR | Status: AC
  Administered 2013-10-12: 10 mg via INTRAVENOUS

## 2013-10-12 MED ORDER — DEXTROSE 5 % IV SOLN
Freq: Once | INTRAVENOUS | Status: AC
  Administered 2013-10-12: 11:00:00 via INTRAVENOUS

## 2013-10-12 MED ORDER — LEUCOVORIN CALCIUM INJECTION 350 MG
370.0000 mg/m2 | Freq: Once | INTRAVENOUS | Status: AC
  Administered 2013-10-12: 700 mg via INTRAVENOUS
  Filled 2013-10-12: qty 35

## 2013-10-12 MED ORDER — DEXAMETHASONE SODIUM PHOSPHATE 10 MG/ML IJ SOLN
INTRAMUSCULAR | Status: AC
  Filled 2013-10-12: qty 1

## 2013-10-12 MED ORDER — ONDANSETRON 8 MG/NS 50 ML IVPB
INTRAVENOUS | Status: AC
  Filled 2013-10-12: qty 8

## 2013-10-12 NOTE — Progress Notes (Signed)
[facility] OFFICE PROGRESS NOTE  Patient Care Team: Garnetta Buddy, MD as PCP - General (Family Medicine) Dyke Maes, MD as Consulting Physician (Nephrology) Knox Saliva Jamelle Haring, MD as Consulting Physician (Psychiatry) Artis Delay, MD as Consulting Physician (Hematology and Oncology)  DIAGNOSIS: Metastatic colon cancer, ongoing palliative chemotherapy  SUMMARY OF ONCOLOGIC HISTORY: Oncology History   Colon cancer, KRAS positive   Primary site: Colon and Rectum (Right)   Staging method: AJCC 7th Edition   Clinical: (T4b, N2b, M1) signed by Artis Delay, MD on 08/05/2013  9:52 AM   Pathologic: Stage IVB (T4b, N2b, M1b) signed by Artis Delay, MD on 08/05/2013 10:21 AM   Summary: Stage IVB (T4b, N2b, M1b)      Colon cancer   07/15/2013 - 07/25/2013 Hospital Admission The patient presented to the hospital with pain in her abdomen and underwent surgery for colon cancer   07/15/2013 Imaging CT scan of the abdomen show bowel dilatation suspicious for malignancy   07/17/2013 Tumor Marker Preoperative CEA was 2.2, normal   07/19/2013 Procedure Colonoscopy revealed ascending colon lesion with significant stricture and biopsy showed adenocarcinoma   07/20/2013 Surgery She underwent right hemicolectomy, cholecystectomy, liver biopsy, excision of abdomen no wall mass and omental mass. During surgery, she was also found to have drop metastasis   08/29/2013 Imaging Repeat CT scan show persistent liver metastasis and new omental metastasis   08/31/2013 -  Chemotherapy The patient was started on palliative chemotherapy with FOLFOX, bolus 5-FU committed and oxaliplatin dose reduced due to  peripheral neuropathy    INTERVAL HISTORY: Johny Shears 53 y.o. female returns for further followup. Her blood sugar is under excellent control. She denies worsening peripheral neuropathy. Denies any recent nausea or vomiting.  I have reviewed the past medical history, past surgical  history, social history and family history with the patient and they are unchanged from previous note.  ALLERGIES:  has No Known Allergies.  MEDICATIONS:  Current Outpatient Prescriptions  Medication Sig Dispense Refill  . ACCU-CHEK AVIVA PLUS test strip USE AS DIRECTED  100 each  10  . atorvastatin (LIPITOR) 40 MG tablet Take 1 tablet (40 mg total) by mouth daily.  90 tablet  3  . buprenorphine-naloxone (SUBOXONE) 8-2 MG SUBL SL tablet Place 1 tablet under the tongue 3 (three) times daily.      . clonazePAM (KLONOPIN) 1 MG tablet Take 1 mg by mouth 3 (three) times daily as needed for anxiety.       . diazepam (VALIUM) 10 MG tablet Take 10 mg by mouth 3 (three) times daily.      Marland Kitchen docusate sodium (COLACE) 100 MG capsule TAKE ONE CAPSULE BY MOUTH TWICE A DAY  60 capsule  5  . furosemide (LASIX) 40 MG tablet Take 1 tablet (40 mg total) by mouth 2 (two) times daily.  60 tablet  5  . gabapentin (NEURONTIN) 300 MG capsule Take 2 capsules (600 mg total) by mouth 2 (two) times daily.  120 capsule  5  . HUMALOG 100 UNIT/ML injection INJECT 2-8 UNITS INTO THE SKIN DAILY. PT USES SLIDING SCALE  10 mL  3  . hydrOXYzine (ATARAX/VISTARIL) 25 MG tablet Take 50 mg by mouth 3 (three) times daily as needed for anxiety.      Marland Kitchen ibuprofen (ADVIL,MOTRIN) 800 MG tablet Take 800 mg by mouth every 8 (eight) hours as needed for fever, headache, mild pain or moderate pain.      Marland Kitchen insulin glargine (LANTUS) 100  UNIT/ML injection Inject 25 Units into the skin daily.       Marland Kitchen lamoTRIgine (LAMICTAL) 100 MG tablet Take 100 mg by mouth 2 (two) times daily.      Marland Kitchen lidocaine-prilocaine (EMLA) cream Apply 1 application topically as needed (for pain).      Marland Kitchen omeprazole (PRILOSEC) 20 MG capsule Take 20 mg by mouth daily.      . ondansetron (ZOFRAN) 8 MG tablet Take 1 tablet (8 mg total) by mouth every 8 (eight) hours as needed.  30 tablet  1  . OVER THE COUNTER MEDICATION Apply 1 application to eye daily.      . polyethylene  glycol powder (GLYCOLAX/MIRALAX) powder USE AS DIRECTED  255 g  3  . prochlorperazine (COMPAZINE) 10 MG tablet Take 1 tablet (10 mg total) by mouth every 6 (six) hours as needed (Nausea or vomiting).  30 tablet  1  . ramelteon (ROZEREM) 8 MG tablet Take 8 mg by mouth at bedtime.      . ziprasidone (GEODON) 80 MG capsule Take 80 mg by mouth at bedtime.        No current facility-administered medications for this visit.    REVIEW OF SYSTEMS:   Constitutional: Denies fevers, chills or abnormal weight loss Eyes: Denies blurriness of vision Ears, nose, mouth, throat, and face: Denies mucositis or sore throat Respiratory: Denies cough, dyspnea or wheezes Cardiovascular: Denies palpitation, chest discomfort or lower extremity swelling Gastrointestinal:  Denies nausea, heartburn or change in bowel habits Skin: Denies abnormal skin rashes Lymphatics: Denies new lymphadenopathy or easy bruising Neurological:Denies numbness, tingling or new weaknesses Behavioral/Psych: Mood is stable, no new changes  All other systems were reviewed with the patient and are negative.  PHYSICAL EXAMINATION: ECOG PERFORMANCE STATUS: 0 - Asymptomatic  Filed Vitals:   10/12/13 0954  BP: 123/73  Pulse: 65  Temp: 97.1 F (36.2 C)  Resp: 18   Filed Weights   10/12/13 0954  Weight: 168 lb 11.2 oz (76.522 kg)    GENERAL:alert, no distress and comfortable SKIN: skin color, texture, turgor are normal, no rashes or significant lesions EYES: normal, Conjunctiva are pink and non-injected, sclera clear OROPHARYNX:no exudate, no erythema and lips, buccal mucosa, and tongue normal  NECK: supple, thyroid normal size, non-tender, without nodularity LYMPH:  no palpable lymphadenopathy in the cervical, axillary or inguinal LUNGS: clear to auscultation and percussion with normal breathing effort HEART: regular rate & rhythm and no murmurs and no lower extremity edema ABDOMEN:abdomen soft, non-tender and normal bowel  sounds Musculoskeletal:no cyanosis of digits and no clubbing  NEURO: alert & oriented x 3 with fluent speech, no focal motor/sensory deficits  LABORATORY DATA:  I have reviewed the data as listed    Component Value Date/Time   NA 143 09/28/2013 0939   NA 143 08/12/2013 1005   K 4.2 09/28/2013 0939   K 4.2 08/12/2013 1005   CL 105 08/12/2013 1005   CO2 31* 09/28/2013 0939   CO2 27 08/12/2013 1005   GLUCOSE 189* 09/28/2013 0939   GLUCOSE 95 08/12/2013 1005   BUN 11.8 09/28/2013 0939   BUN 13 08/12/2013 1005   CREATININE 1.2* 09/28/2013 0939   CREATININE 1.14* 08/12/2013 1005   CREATININE 1.38* 05/26/2013 1703   CALCIUM 9.1 09/28/2013 0939   CALCIUM 8.7 08/12/2013 1005   PROT 6.7 09/28/2013 0939   PROT 6.4 08/12/2013 1005   ALBUMIN 3.5 09/28/2013 0939   ALBUMIN 2.9* 08/12/2013 1005   AST 32 09/28/2013 0939   AST  22 08/12/2013 1005   ALT 32 09/28/2013 0939   ALT 23 08/12/2013 1005   ALKPHOS 117 09/28/2013 0939   ALKPHOS 199* 08/12/2013 1005   BILITOT 0.31 09/28/2013 0939   BILITOT 0.2* 08/12/2013 1005   GFRNONAA 55* 08/12/2013 1005   GFRAA 63* 08/12/2013 1005    No results found for this basename: SPEP, UPEP,  kappa and lambda light chains    Lab Results  Component Value Date   WBC 3.9 10/12/2013   NEUTROABS 1.7 10/12/2013   HGB 12.5 10/12/2013   HCT 37.5 10/12/2013   MCV 84.1 10/12/2013   PLT 142* 10/12/2013      Chemistry      Component Value Date/Time   NA 143 09/28/2013 0939   NA 143 08/12/2013 1005   K 4.2 09/28/2013 0939   K 4.2 08/12/2013 1005   CL 105 08/12/2013 1005   CO2 31* 09/28/2013 0939   CO2 27 08/12/2013 1005   BUN 11.8 09/28/2013 0939   BUN 13 08/12/2013 1005   CREATININE 1.2* 09/28/2013 0939   CREATININE 1.14* 08/12/2013 1005   CREATININE 1.38* 05/26/2013 1703      Component Value Date/Time   CALCIUM 9.1 09/28/2013 0939   CALCIUM 8.7 08/12/2013 1005   ALKPHOS 117 09/28/2013 0939   ALKPHOS 199* 08/12/2013 1005   AST 32 09/28/2013 0939   AST 22 08/12/2013 1005   ALT 32 09/28/2013 0939   ALT 23 08/12/2013  1005   BILITOT 0.31 09/28/2013 0939   BILITOT 0.2* 08/12/2013 1005      ASSESSMENT & PLAN:  #1 metastatic colon cancer She tolerated treatment well. I will continue with slight dose adjustment due to extra weight gain. I plan to give her 3 months of treatment before repeating CT scan #2 diabetes She will continue insulin adjustments as directed by her primary care provider #3 chronic kidney disease This is stable and not worse #4 tobacco abuse The patient will continue her effort to stop smoking #5 mild peripheral neuropathy The patient had diabetic neuropathy even before we start her on treatment. I recommend we continue current dosage adjustment for oxaliplatin.  All questions were answered. The patient knows to call the clinic with any problems, questions or concerns. No barriers to learning was detected. I spent 25 minutes counseling the patient face to face. The total time spent in the appointment was 40 minutes and more than 50% was on counseling and review of test results     [facility], [place], MD 10/12/2013 10:16 AM

## 2013-10-12 NOTE — Patient Instructions (Signed)
[facility] Discharge Instructions for Patients Receiving Chemotherapy  Today you received the following chemotherapy agents oxaliplatin/leucovorin/fluorouracil  To help prevent nausea and vomiting after your treatment, we encourage you to take your nausea medication as directed If you develop nausea and vomiting that is not controlled by your nausea medication, call the clinic.   BELOW ARE SYMPTOMS THAT SHOULD BE REPORTED IMMEDIATELY:  *FEVER GREATER THAN 100.5 F  *CHILLS WITH OR WITHOUT FEVER  NAUSEA AND VOMITING THAT IS NOT CONTROLLED WITH YOUR NAUSEA MEDICATION  *UNUSUAL SHORTNESS OF BREATH  *UNUSUAL BRUISING OR BLEEDING  TENDERNESS IN MOUTH AND THROAT WITH OR WITHOUT PRESENCE OF ULCERS  *URINARY PROBLEMS  *BOWEL PROBLEMS  UNUSUAL RASH Items with * indicate a potential emergency and should be followed up as soon as possible.  Feel free to call the clinic you have any questions or concerns. The clinic phone number is [phone].  

## 2013-10-12 NOTE — Telephone Encounter (Signed)
gv and pritned appt shced and avs for pt for March and April.....sed added tx.

## 2013-10-14 ENCOUNTER — Ambulatory Visit (HOSPITAL_BASED_OUTPATIENT_CLINIC_OR_DEPARTMENT_OTHER): Payer: Medicaid Other

## 2013-10-14 VITALS — BP 119/70 | HR 62 | Temp 98.7°F

## 2013-10-14 DIAGNOSIS — C182 Malignant neoplasm of ascending colon: Secondary | ICD-10-CM

## 2013-10-14 DIAGNOSIS — C189 Malignant neoplasm of colon, unspecified: Secondary | ICD-10-CM

## 2013-10-14 DIAGNOSIS — Z452 Encounter for adjustment and management of vascular access device: Secondary | ICD-10-CM

## 2013-10-14 MED ORDER — HEPARIN SOD (PORK) LOCK FLUSH 100 UNIT/ML IV SOLN
500.0000 [IU] | Freq: Once | INTRAVENOUS | Status: AC | PRN
Start: 2013-10-14 — End: 2013-10-14
  Administered 2013-10-14: 500 [IU]
  Filled 2013-10-14: qty 5

## 2013-10-14 MED ORDER — SODIUM CHLORIDE 0.9 % IJ SOLN
10.0000 mL | INTRAMUSCULAR | Status: DC | PRN
  Administered 2013-10-14: 10 mL
  Filled 2013-10-14: qty 10

## 2013-10-14 NOTE — Patient Instructions (Signed)
[facility] Discharge Instructions for Patients Receiving Chemotherapy  To help prevent nausea and vomiting after your treatment, we encourage you to take your nausea medications as directed   If you develop nausea and vomiting that is not controlled by your nausea medication, call the clinic.   BELOW ARE SYMPTOMS THAT SHOULD BE REPORTED IMMEDIATELY:  *FEVER GREATER THAN 100.5 F  *CHILLS WITH OR WITHOUT FEVER  NAUSEA AND VOMITING THAT IS NOT CONTROLLED WITH YOUR NAUSEA MEDICATION  *UNUSUAL SHORTNESS OF BREATH  *UNUSUAL BRUISING OR BLEEDING  TENDERNESS IN MOUTH AND THROAT WITH OR WITHOUT PRESENCE OF ULCERS  *URINARY PROBLEMS  *BOWEL PROBLEMS  UNUSUAL RASH Items with * indicate a potential emergency and should be followed up as soon as possible.   Implanted [facility] Guide An implanted port is a type of central line that is placed under the skin. Central lines are used to provide IV access when treatment or nutrition needs to be given through a person's veins. Implanted ports are used for long-term IV access. An implanted port may be placed because:  You need IV medicine that would be irritating to the small veins in your hands or arms.  You need long-term IV medicines, such as antibiotics.  You need IV nutrition for a long period.  You need frequent blood draws for lab tests.  You need dialysis.  Implanted ports are usually placed in the chest area, but they can also be placed in the upper arm, the abdomen, or the leg. An implanted port has two main parts:  Reservoir. The reservoir is round and will appear as a small, raised area under your skin. The reservoir is the part where a needle is inserted to give medicines or draw blood.  Catheter. The catheter is a thin, flexible tube that extends from the reservoir. The catheter is placed into a large vein. Medicine that is inserted into the reservoir goes into the catheter and then into the vein.  HOW WILL I  CARE FOR MY INCISION SITE? Do not get the incision site wet. Bathe or shower as directed by your health care provider.  HOW IS MY PORT ACCESSED? Special steps must be taken to access the port:  Before the port is accessed, a numbing cream can be placed on the skin. This helps numb the skin over the port site.  Your health care provider uses a sterile technique to access the port. Your health care provider must put on a mask and sterile gloves. The skin over your port is cleaned carefully with an antiseptic and allowed to dry. The port is gently pinched between sterile gloves, and a needle is inserted into the port. Only "non-coring" port needles should be used to access the port. Once the port is accessed, a blood return should be checked. This helps ensure that the port is in the vein and is not clogged.  If your port needs to remain accessed for a constant infusion, a clear (transparent) bandage will be placed over the needle site. The bandage and needle will need to be changed every week, or as directed by your health care provider.  Keep the bandage covering the needle clean and dry. Do not get it wet. Follow your health care provider's instructions on how to take a shower or bath while the port is accessed.  If your port does not need to stay accessed, no bandage is needed over the port.  WHAT IS FLUSHING? Flushing helps keep the port from getting  clogged. Follow your health care provider's instructions on how and when to flush the port. Ports are usually flushed with saline solution or a medicine called heparin. The need for flushing will depend on how the port is used.  If the port is used for intermittent medicines or blood draws, the port will need to be flushed:  After medicines have been given.  After blood has been drawn.  As part of routine maintenance.  If a constant infusion is running, the port may not need to be flushed.  HOW LONG WILL MY PORT STAY IMPLANTED? The port  can stay in for as long as your health care provider thinks it is needed. When it is time for the port to come out, surgery will be done to remove it. The procedure is similar to the one performed when the port was put in.  WHEN SHOULD I SEEK IMMEDIATE MEDICAL CARE? When you have an implanted port, you should seek immediate medical care if:  You notice a bad smell coming from the incision site.  You have swelling, redness, or drainage at the incision site.  You have more swelling or pain at the port site or the surrounding area.  You have a fever that is not controlled with medicine. Document Released: 07/21/2005 Document Revised: 05/11/2013 Document Reviewed: 03/28/2013 [facility] Patient Information 2014 [place].  you have any questions or concerns. The clinic phone number is [phone].

## 2013-10-26 ENCOUNTER — Other Ambulatory Visit (HOSPITAL_BASED_OUTPATIENT_CLINIC_OR_DEPARTMENT_OTHER): Payer: Medicaid Other

## 2013-10-26 ENCOUNTER — Ambulatory Visit (HOSPITAL_BASED_OUTPATIENT_CLINIC_OR_DEPARTMENT_OTHER): Payer: Medicaid Other

## 2013-10-26 VITALS — BP 108/65 | HR 54 | Temp 97.8°F | Resp 18

## 2013-10-26 DIAGNOSIS — C787 Secondary malignant neoplasm of liver and intrahepatic bile duct: Secondary | ICD-10-CM

## 2013-10-26 DIAGNOSIS — C189 Malignant neoplasm of colon, unspecified: Secondary | ICD-10-CM

## 2013-10-26 DIAGNOSIS — C182 Malignant neoplasm of ascending colon: Secondary | ICD-10-CM | POA: Diagnosis not present

## 2013-10-26 DIAGNOSIS — Z5111 Encounter for antineoplastic chemotherapy: Secondary | ICD-10-CM | POA: Diagnosis present

## 2013-10-26 LAB — CBC & DIFF AND RETIC
BASO%: 0.3 % (ref 0.0–2.0)
Basophils Absolute: 0 10*3/uL (ref 0.0–0.1)
EOS%: 0.5 % (ref 0.0–7.0)
Eosinophils Absolute: 0 10*3/uL (ref 0.0–0.5)
HCT: 37.3 % (ref 34.8–46.6)
HGB: 12.6 g/dL (ref 11.6–15.9)
Immature Retic Fract: 0.9 % — ABNORMAL LOW (ref 1.60–10.00)
LYMPH%: 26.6 % (ref 14.0–49.7)
MCH: 27.7 pg (ref 25.1–34.0)
MCHC: 33.8 g/dL (ref 31.5–36.0)
MCV: 82 fL (ref 79.5–101.0)
MONO#: 0.6 10*3/uL (ref 0.1–0.9)
MONO%: 8.4 % (ref 0.0–14.0)
NEUT#: 4.2 10*3/uL (ref 1.5–6.5)
NEUT%: 64.2 % (ref 38.4–76.8)
Platelets: 127 10*3/uL — ABNORMAL LOW (ref 145–400)
RBC: 4.55 10*6/uL (ref 3.70–5.45)
RDW: 15.4 % — AB (ref 11.2–14.5)
RETIC %: 1.07 % (ref 0.70–2.10)
Retic Ct Abs: 48.69 10*3/uL (ref 33.70–90.70)
WBC: 6.5 10*3/uL (ref 3.9–10.3)
lymph#: 1.7 10*3/uL (ref 0.9–3.3)

## 2013-10-26 LAB — COMPREHENSIVE METABOLIC PANEL (CC13)
ALT: 38 U/L (ref 0–55)
ANION GAP: 12 meq/L — AB (ref 3–11)
AST: 26 U/L (ref 5–34)
Albumin: 3.8 g/dL (ref 3.5–5.0)
Alkaline Phosphatase: 105 U/L (ref 40–150)
BILIRUBIN TOTAL: 0.38 mg/dL (ref 0.20–1.20)
BUN: 23 mg/dL (ref 7.0–26.0)
CO2: 24 meq/L (ref 22–29)
Calcium: 9.6 mg/dL (ref 8.4–10.4)
Chloride: 103 mEq/L (ref 98–109)
Creatinine: 1.4 mg/dL — ABNORMAL HIGH (ref 0.6–1.1)
GLUCOSE: 346 mg/dL — AB (ref 70–140)
POTASSIUM: 3.2 meq/L — AB (ref 3.5–5.1)
Sodium: 139 mEq/L (ref 136–145)
TOTAL PROTEIN: 7.1 g/dL (ref 6.4–8.3)

## 2013-10-26 MED ORDER — LEUCOVORIN CALCIUM INJECTION 350 MG
400.0000 mg/m2 | Freq: Once | INTRAVENOUS | Status: AC
  Administered 2013-10-26: 692 mg via INTRAVENOUS
  Filled 2013-10-26: qty 34.6

## 2013-10-26 MED ORDER — ONDANSETRON 8 MG/NS 50 ML IVPB
INTRAVENOUS | Status: AC
  Filled 2013-10-26: qty 8

## 2013-10-26 MED ORDER — DEXAMETHASONE SODIUM PHOSPHATE 10 MG/ML IJ SOLN
INTRAMUSCULAR | Status: AC
  Filled 2013-10-26: qty 1

## 2013-10-26 MED ORDER — OXALIPLATIN CHEMO INJECTION 100 MG/20ML
68.0000 mg/m2 | Freq: Once | INTRAVENOUS | Status: AC
  Administered 2013-10-26: 120 mg via INTRAVENOUS
  Filled 2013-10-26: qty 24

## 2013-10-26 MED ORDER — DEXTROSE 5 % IV SOLN
Freq: Once | INTRAVENOUS | Status: AC
  Administered 2013-10-26: 11:00:00 via INTRAVENOUS

## 2013-10-26 MED ORDER — ONDANSETRON 8 MG/50ML IVPB (CHCC)
8.0000 mg | Freq: Once | INTRAVENOUS | Status: AC
  Administered 2013-10-26: 8 mg via INTRAVENOUS

## 2013-10-26 MED ORDER — SODIUM CHLORIDE 0.9 % IV SOLN
2400.0000 mg/m2 | INTRAVENOUS | Status: DC
  Administered 2013-10-26: 4150 mg via INTRAVENOUS
  Filled 2013-10-26: qty 83

## 2013-10-26 MED ORDER — DEXAMETHASONE SODIUM PHOSPHATE 10 MG/ML IJ SOLN
10.0000 mg | Freq: Once | INTRAMUSCULAR | Status: AC
  Administered 2013-10-26: 10 mg via INTRAVENOUS

## 2013-10-26 NOTE — Patient Instructions (Signed)
[facility] Discharge Instructions for Patients Receiving Chemotherapy  Today you received the following chemotherapy agents Oxaliplatin, Leucovorin and 5FU.  To help prevent nausea and vomiting after your treatment, we encourage you to take your nausea medication.   If you develop nausea and vomiting that is not controlled by your nausea medication, call the clinic.   BELOW ARE SYMPTOMS THAT SHOULD BE REPORTED IMMEDIATELY:  *FEVER GREATER THAN 100.5 F  *CHILLS WITH OR WITHOUT FEVER  NAUSEA AND VOMITING THAT IS NOT CONTROLLED WITH YOUR NAUSEA MEDICATION  *UNUSUAL SHORTNESS OF BREATH  *UNUSUAL BRUISING OR BLEEDING  TENDERNESS IN MOUTH AND THROAT WITH OR WITHOUT PRESENCE OF ULCERS  *URINARY PROBLEMS  *BOWEL PROBLEMS  UNUSUAL RASH Items with * indicate a potential emergency and should be followed up as soon as possible.  Feel free to call the clinic you have any questions or concerns. The clinic phone number is [phone].    

## 2013-10-28 ENCOUNTER — Ambulatory Visit (HOSPITAL_BASED_OUTPATIENT_CLINIC_OR_DEPARTMENT_OTHER): Payer: Medicaid Other

## 2013-10-28 VITALS — BP 108/58 | HR 48 | Temp 98.1°F

## 2013-10-28 DIAGNOSIS — C189 Malignant neoplasm of colon, unspecified: Secondary | ICD-10-CM

## 2013-10-28 DIAGNOSIS — Z452 Encounter for adjustment and management of vascular access device: Secondary | ICD-10-CM

## 2013-10-28 DIAGNOSIS — C182 Malignant neoplasm of ascending colon: Secondary | ICD-10-CM

## 2013-10-28 MED ORDER — HEPARIN SOD (PORK) LOCK FLUSH 100 UNIT/ML IV SOLN
500.0000 [IU] | Freq: Once | INTRAVENOUS | Status: AC | PRN
  Administered 2013-10-28: 500 [IU]
  Filled 2013-10-28: qty 5

## 2013-10-28 MED ORDER — SODIUM CHLORIDE 0.9 % IJ SOLN
10.0000 mL | INTRAMUSCULAR | Status: DC | PRN
  Administered 2013-10-28: 10 mL
  Filled 2013-10-28: qty 10

## 2013-11-09 ENCOUNTER — Encounter: Payer: Self-pay | Admitting: Hematology and Oncology

## 2013-11-09 ENCOUNTER — Ambulatory Visit (HOSPITAL_BASED_OUTPATIENT_CLINIC_OR_DEPARTMENT_OTHER): Payer: Medicaid Other | Admitting: Hematology and Oncology

## 2013-11-09 ENCOUNTER — Telehealth: Payer: Self-pay | Admitting: Hematology and Oncology

## 2013-11-09 ENCOUNTER — Ambulatory Visit (HOSPITAL_BASED_OUTPATIENT_CLINIC_OR_DEPARTMENT_OTHER): Payer: Medicaid Other

## 2013-11-09 ENCOUNTER — Telehealth: Payer: Self-pay | Admitting: *Deleted

## 2013-11-09 ENCOUNTER — Other Ambulatory Visit (HOSPITAL_BASED_OUTPATIENT_CLINIC_OR_DEPARTMENT_OTHER): Payer: Medicaid Other

## 2013-11-09 VITALS — BP 121/68 | HR 61 | Temp 97.0°F | Resp 20 | Ht 65.0 in | Wt 166.6 lb

## 2013-11-09 DIAGNOSIS — D696 Thrombocytopenia, unspecified: Secondary | ICD-10-CM

## 2013-11-09 DIAGNOSIS — C787 Secondary malignant neoplasm of liver and intrahepatic bile duct: Secondary | ICD-10-CM

## 2013-11-09 DIAGNOSIS — G609 Hereditary and idiopathic neuropathy, unspecified: Secondary | ICD-10-CM

## 2013-11-09 DIAGNOSIS — E119 Type 2 diabetes mellitus without complications: Secondary | ICD-10-CM

## 2013-11-09 DIAGNOSIS — F17200 Nicotine dependence, unspecified, uncomplicated: Secondary | ICD-10-CM

## 2013-11-09 DIAGNOSIS — C182 Malignant neoplasm of ascending colon: Secondary | ICD-10-CM | POA: Diagnosis not present

## 2013-11-09 DIAGNOSIS — Z5111 Encounter for antineoplastic chemotherapy: Secondary | ICD-10-CM | POA: Diagnosis present

## 2013-11-09 DIAGNOSIS — N189 Chronic kidney disease, unspecified: Secondary | ICD-10-CM

## 2013-11-09 DIAGNOSIS — C189 Malignant neoplasm of colon, unspecified: Secondary | ICD-10-CM

## 2013-11-09 LAB — COMPREHENSIVE METABOLIC PANEL (CC13)
ALK PHOS: 102 U/L (ref 40–150)
ALT: 29 U/L (ref 0–55)
AST: 21 U/L (ref 5–34)
Albumin: 3.7 g/dL (ref 3.5–5.0)
Anion Gap: 11 mEq/L (ref 3–11)
BILIRUBIN TOTAL: 0.3 mg/dL (ref 0.20–1.20)
BUN: 22.1 mg/dL (ref 7.0–26.0)
CALCIUM: 9.7 mg/dL (ref 8.4–10.4)
CHLORIDE: 100 meq/L (ref 98–109)
CO2: 30 mEq/L — ABNORMAL HIGH (ref 22–29)
CREATININE: 1.4 mg/dL — AB (ref 0.6–1.1)
Glucose: 337 mg/dl — ABNORMAL HIGH (ref 70–140)
Potassium: 4 mEq/L (ref 3.5–5.1)
Sodium: 142 mEq/L (ref 136–145)
Total Protein: 7.3 g/dL (ref 6.4–8.3)

## 2013-11-09 LAB — CBC & DIFF AND RETIC
BASO%: 0.1 % (ref 0.0–2.0)
BASOS ABS: 0 10*3/uL (ref 0.0–0.1)
EOS ABS: 0 10*3/uL (ref 0.0–0.5)
EOS%: 0 % (ref 0.0–7.0)
HCT: 39 % (ref 34.8–46.6)
HEMOGLOBIN: 13.2 g/dL (ref 11.6–15.9)
Immature Retic Fract: 3 % (ref 1.60–10.00)
LYMPH%: 6.8 % — ABNORMAL LOW (ref 14.0–49.7)
MCH: 28 pg (ref 25.1–34.0)
MCHC: 33.8 g/dL (ref 31.5–36.0)
MCV: 82.8 fL (ref 79.5–101.0)
MONO#: 0.6 10*3/uL (ref 0.1–0.9)
MONO%: 6.1 % (ref 0.0–14.0)
NEUT%: 87 % — ABNORMAL HIGH (ref 38.4–76.8)
NEUTROS ABS: 9 10*3/uL — AB (ref 1.5–6.5)
Platelets: 126 10*3/uL — ABNORMAL LOW (ref 145–400)
RBC: 4.71 10*6/uL (ref 3.70–5.45)
RDW: 15.4 % — AB (ref 11.2–14.5)
RETIC CT ABS: 51.81 10*3/uL (ref 33.70–90.70)
Retic %: 1.1 % (ref 0.70–2.10)
WBC: 10.4 10*3/uL — ABNORMAL HIGH (ref 3.9–10.3)
lymph#: 0.7 10*3/uL — ABNORMAL LOW (ref 0.9–3.3)

## 2013-11-09 MED ORDER — SODIUM CHLORIDE 0.9 % IV SOLN
2400.0000 mg/m2 | INTRAVENOUS | Status: DC
  Administered 2013-11-09: 4150 mg via INTRAVENOUS
  Filled 2013-11-09: qty 83

## 2013-11-09 MED ORDER — DEXTROSE 5 % IV SOLN
Freq: Once | INTRAVENOUS | Status: AC
  Administered 2013-11-09: 10:00:00 via INTRAVENOUS

## 2013-11-09 MED ORDER — ONDANSETRON 8 MG/NS 50 ML IVPB
INTRAVENOUS | Status: AC
  Filled 2013-11-09: qty 8

## 2013-11-09 MED ORDER — DEXAMETHASONE SODIUM PHOSPHATE 10 MG/ML IJ SOLN
INTRAMUSCULAR | Status: AC
  Filled 2013-11-09: qty 1

## 2013-11-09 MED ORDER — DEXAMETHASONE SODIUM PHOSPHATE 10 MG/ML IJ SOLN
10.0000 mg | Freq: Once | INTRAMUSCULAR | Status: AC
  Administered 2013-11-09: 10 mg via INTRAVENOUS

## 2013-11-09 MED ORDER — LEUCOVORIN CALCIUM INJECTION 350 MG
405.0000 mg/m2 | Freq: Once | INTRAVENOUS | Status: AC
  Administered 2013-11-09: 700 mg via INTRAVENOUS
  Filled 2013-11-09: qty 35

## 2013-11-09 MED ORDER — ONDANSETRON 8 MG/50ML IVPB (CHCC)
8.0000 mg | Freq: Once | INTRAVENOUS | Status: AC
  Administered 2013-11-09: 8 mg via INTRAVENOUS

## 2013-11-09 MED ORDER — DEXTROSE 5 % IV SOLN
68.0000 mg/m2 | Freq: Once | INTRAVENOUS | Status: AC
  Administered 2013-11-09: 120 mg via INTRAVENOUS
  Filled 2013-11-09: qty 24

## 2013-11-09 NOTE — Patient Instructions (Signed)
[facility] Discharge Instructions for Patients Receiving Chemotherapy  Today you received the following chemotherapy agents: Oxaliplatin, Leucovorin, 5FU   To help prevent nausea and vomiting after your treatment, we encourage you to take your nausea medication as prescribed.    If you develop nausea and vomiting that is not controlled by your nausea medication, call the clinic.   BELOW ARE SYMPTOMS THAT SHOULD BE REPORTED IMMEDIATELY:  *FEVER GREATER THAN 100.5 F  *CHILLS WITH OR WITHOUT FEVER  NAUSEA AND VOMITING THAT IS NOT CONTROLLED WITH YOUR NAUSEA MEDICATION  *UNUSUAL SHORTNESS OF BREATH  *UNUSUAL BRUISING OR BLEEDING  TENDERNESS IN MOUTH AND THROAT WITH OR WITHOUT PRESENCE OF ULCERS  *URINARY PROBLEMS  *BOWEL PROBLEMS  UNUSUAL RASH Items with * indicate a potential emergency and should be followed up as soon as possible.  Feel free to call the clinic you have any questions or concerns. The clinic phone number is [phone].    

## 2013-11-09 NOTE — Progress Notes (Signed)
[facility] OFFICE PROGRESS NOTE  Patient Care Team: Angelica Ran, MD as PCP - General (Family Medicine) Windy Kalata, MD as Consulting Physician (Nephrology) Leatrice Jewels Rayetta Pigg, MD as Consulting Physician (Psychiatry) Heath Lark, MD as Consulting Physician (Hematology and Oncology)  DIAGNOSIS: Metastatic colon cancer, ongoing palliative therapy  SUMMARY OF ONCOLOGIC HISTORY: Oncology History   Colon cancer, KRAS positive   Primary site: Colon and Rectum (Right)   Staging method: AJCC 7th Edition   Clinical: (T4b, N2b, M1) signed by Heath Lark, MD on 08/05/2013  9:52 AM   Pathologic: Stage IVB (T4b, N2b, M1b) signed by Heath Lark, MD on 08/05/2013 10:21 AM   Summary: Stage IVB (T4b, N2b, M1b)      Colon cancer   07/15/2013 - 07/25/2013 Hospital Admission The patient presented to the hospital with pain in her abdomen and underwent surgery for colon cancer   07/15/2013 Imaging CT scan of the abdomen show bowel dilatation suspicious for malignancy   07/17/2013 Tumor Marker Preoperative CEA was 2.2, normal   07/19/2013 Procedure Colonoscopy revealed ascending colon lesion with significant stricture and biopsy showed adenocarcinoma   07/20/2013 Surgery She underwent right hemicolectomy, cholecystectomy, liver biopsy, excision of abdomen no wall mass and omental mass. During surgery, she was also found to have drop metastasis   08/29/2013 Imaging Repeat CT scan show persistent liver metastasis and new omental metastasis   08/31/2013 -  Chemotherapy The patient was started on palliative chemotherapy with FOLFOX, bolus 5-FU committed and oxaliplatin dose reduced due to  peripheral neuropathy    INTERVAL HISTORY: Monica [name] 53 y.o. female returns for further followup. She is concerned of occasional abdominal bloating. She stated that her blood sugar has been running high in the 300 range over the past 2 weeks. She denies any worsening peripheral  neuropathy. The patient denies any mouth sores, nausea, vomiting or change in bowel habits  I have reviewed the past medical history, past surgical history, social history and family history with the patient and they are unchanged from previous note.  ALLERGIES:  has No Known Allergies.  MEDICATIONS:  Current Outpatient Prescriptions  Medication Sig Dispense Refill  . ACCU-CHEK AVIVA PLUS test strip USE AS DIRECTED  100 each  10  . atorvastatin (LIPITOR) 40 MG tablet Take 1 tablet (40 mg total) by mouth daily.  90 tablet  3  . buprenorphine-naloxone (SUBOXONE) 8-2 MG SUBL SL tablet Place 1 tablet under the tongue 3 (three) times daily.      . clonazePAM (KLONOPIN) 1 MG tablet Take 1 mg by mouth 3 (three) times daily as needed for anxiety.       . diazepam (VALIUM) 10 MG tablet Take 10 mg by mouth 3 (three) times daily.      Marland Kitchen docusate sodium (COLACE) 100 MG capsule TAKE ONE CAPSULE BY MOUTH TWICE A DAY  60 capsule  5  . furosemide (LASIX) 40 MG tablet Take 1 tablet (40 mg total) by mouth 2 (two) times daily.  60 tablet  5  . gabapentin (NEURONTIN) 300 MG capsule Take 2 capsules (600 mg total) by mouth 2 (two) times daily.  120 capsule  5  . HUMALOG 100 UNIT/ML injection INJECT 2-8 UNITS INTO THE SKIN DAILY. PT USES SLIDING SCALE  10 mL  3  . hydrOXYzine (ATARAX/VISTARIL) 25 MG tablet Take 50 mg by mouth 3 (three) times daily as needed for anxiety.      Marland Kitchen ibuprofen (ADVIL,MOTRIN) 800 MG tablet Take 800  mg by mouth every 8 (eight) hours as needed for fever, headache, mild pain or moderate pain.      Marland Kitchen insulin glargine (LANTUS) 100 UNIT/ML injection Inject 25 Units into the skin daily.       Marland Kitchen lamoTRIgine (LAMICTAL) 100 MG tablet Take 100 mg by mouth 2 (two) times daily.      Marland Kitchen lidocaine-prilocaine (EMLA) cream Apply 1 application topically as needed (for pain).      Marland Kitchen omeprazole (PRILOSEC) 20 MG capsule Take 20 mg by mouth daily.      . ondansetron (ZOFRAN) 8 MG tablet Take 1 tablet (8 mg  total) by mouth every 8 (eight) hours as needed.  30 tablet  1  . OVER THE COUNTER MEDICATION Apply 1 application to eye daily.      . polyethylene glycol powder (GLYCOLAX/MIRALAX) powder USE AS DIRECTED  255 g  3  . prochlorperazine (COMPAZINE) 10 MG tablet Take 1 tablet (10 mg total) by mouth every 6 (six) hours as needed (Nausea or vomiting).  30 tablet  1  . ramelteon (ROZEREM) 8 MG tablet Take 8 mg by mouth at bedtime.      . ziprasidone (GEODON) 80 MG capsule Take 80 mg by mouth at bedtime.        No current facility-administered medications for this visit.    REVIEW OF SYSTEMS:   Constitutional: Denies fevers, chills or abnormal weight loss Eyes: Denies blurriness of vision Ears, nose, mouth, throat, and face: Denies mucositis or sore throat Respiratory: Denies cough, dyspnea or wheezes Cardiovascular: Denies palpitation, chest discomfort or lower extremity swelling Gastrointestinal:  Denies nausea, heartburn or change in bowel habits Skin: Denies abnormal skin rashes Lymphatics: Denies new lymphadenopathy or easy bruising Neurological:Denies numbness, tingling or new weaknesses Behavioral/Psych: Mood is stable, no new changes  All other systems were reviewed with the patient and are negative.  PHYSICAL EXAMINATION: ECOG PERFORMANCE STATUS: 0 - Asymptomatic  Filed Vitals:   11/09/13 0838  BP: 121/68  Pulse: 61  Temp: 97 F (36.1 C)  Resp: 20   Filed Weights   11/09/13 0838  Weight: 166 lb 9.6 oz (75.569 kg)    GENERAL:alert, no distress and comfortable SKIN: skin color, texture, turgor are normal, no rashes or significant lesions EYES: normal, Conjunctiva are pink and non-injected, sclera clear OROPHARYNX:no exudate, no erythema and lips, buccal mucosa, and tongue normal . No thrush NECK: supple, thyroid normal size, non-tender, without nodularity LYMPH:  no palpable lymphadenopathy in the cervical, axillary or inguinal LUNGS: clear to auscultation and percussion  with normal breathing effort HEART: regular rate & rhythm and no murmurs and no lower extremity edema ABDOMEN:abdomen soft, non-tender and normal bowel sounds. Mild abdominal distention. Musculoskeletal:no cyanosis of digits and no clubbing  NEURO: alert & oriented x 3 with fluent speech, no focal motor/sensory deficits  LABORATORY DATA:  I have reviewed the data as listed    Component Value Date/Time   NA 142 11/09/2013 0822   NA 143 08/12/2013 1005   K 4.0 11/09/2013 0822   K 4.2 08/12/2013 1005   CL 105 08/12/2013 1005   CO2 30* 11/09/2013 0822   CO2 27 08/12/2013 1005   GLUCOSE 337* 11/09/2013 0822   GLUCOSE 95 08/12/2013 1005   BUN 22.1 11/09/2013 0822   BUN 13 08/12/2013 1005   CREATININE 1.4* 11/09/2013 0822   CREATININE 1.14* 08/12/2013 1005   CREATININE 1.38* 05/26/2013 1703   CALCIUM 9.7 11/09/2013 0822   CALCIUM 8.7 08/12/2013 1005  PROT 7.3 11/09/2013 0822   PROT 6.4 08/12/2013 1005   ALBUMIN 3.7 11/09/2013 0822   ALBUMIN 2.9* 08/12/2013 1005   AST 21 11/09/2013 0822   AST 22 08/12/2013 1005   ALT 29 11/09/2013 0822   ALT 23 08/12/2013 1005   ALKPHOS 102 11/09/2013 0822   ALKPHOS 199* 08/12/2013 1005   BILITOT 0.30 11/09/2013 0822   BILITOT 0.2* 08/12/2013 1005   GFRNONAA 55* 08/12/2013 1005   GFRNONAA 44* 05/26/2013 1703   GFRAA 63* 08/12/2013 1005   GFRAA 51* 05/26/2013 1703    No results found for this basename: SPEP,  UPEP,   kappa and lambda light chains    Lab Results  Component Value Date   WBC 10.4* 11/09/2013   NEUTROABS 9.0* 11/09/2013   HGB 13.2 11/09/2013   HCT 39.0 11/09/2013   MCV 82.8 11/09/2013   PLT 126* 11/09/2013      Chemistry      Component Value Date/Time   NA 142 11/09/2013 0822   NA 143 08/12/2013 1005   K 4.0 11/09/2013 0822   K 4.2 08/12/2013 1005   CL 105 08/12/2013 1005   CO2 30* 11/09/2013 0822   CO2 27 08/12/2013 1005   BUN 22.1 11/09/2013 0822   BUN 13 08/12/2013 1005   CREATININE 1.4* 11/09/2013 0822   CREATININE 1.14* 08/12/2013 1005   CREATININE 1.38* 05/26/2013 1703      Component Value  Date/Time   CALCIUM 9.7 11/09/2013 0822   CALCIUM 8.7 08/12/2013 1005   ALKPHOS 102 11/09/2013 0822   ALKPHOS 199* 08/12/2013 1005   AST 21 11/09/2013 0822   AST 22 08/12/2013 1005   ALT 29 11/09/2013 0822   ALT 23 08/12/2013 1005   BILITOT 0.30 11/09/2013 0822   BILITOT 0.2* 08/12/2013 1005     ASSESSMENT & PLAN:  #1 metastatic colon cancer She tolerated treatment well. I will continue with slight dose adjustment due to extra weight gain. I plan to order CT scan before seen her back in 2 weeks to assess response to treatment. If she has good response to treatment, I would also consider adding Avastin for additional benefit. #2 poorly controlled diabetes She will continue insulin adjustments as directed by her primary care provider #3 chronic kidney disease This is stable and not worse #4 tobacco abuse The patient will continue her effort to stop smoking #5 mild peripheral neuropathy The patient had diabetic neuropathy even before we start her on treatment. I recommend we continue current dosage adjustment for oxaliplatin. #6 mild thrombocytopenia This is likely due to recent treatment. The patient denies recent history of bleeding such as epistaxis, hematuria or hematochezia. She is asymptomatic from the low platelet count. I will observe for now.  she does not require transfusion now. I will continue the chemotherapy at current dose without dosage adjustment.  If the thrombocytopenia gets progressive worse in the future, I might have to delay her treatment or adjust the chemotherapy dose.    Orders Placed This Encounter  Procedures  . CT Chest W Contrast    Standing Status: Future     Number of Occurrences:      Standing Expiration Date: 01/09/2015    Order Specific Question:  Reason for Exam (SYMPTOM  OR DIAGNOSIS REQUIRED)    Answer:  staging colon cancer, assess response to Rx    Order Specific Question:  Is the patient pregnant?    Answer:  No    Order Specific Question:  Preferred imaging  location?  Answer:  [facility]  . CT Abdomen Pelvis W Contrast    Standing Status: Future     Number of Occurrences:      Standing Expiration Date: 02/09/2015    Order Specific Question:  Reason for Exam (SYMPTOM  OR DIAGNOSIS REQUIRED)    Answer:  staging colon cancer, assess response to Rx    Order Specific Question:  Is the patient pregnant?    Answer:  No    Order Specific Question:  Preferred imaging location?    Answer:  [facility]  . UA Protein, Dipstick - [facility]    Standing Status: Standing     Number of Occurrences: 9     Standing Expiration Date: 11/10/2014   All questions were answered. The patient knows to call the clinic with any problems, questions or concerns. No barriers to learning was detected.    Heath Lark, MD 11/09/2013 9:10 AM

## 2013-11-09 NOTE — Telephone Encounter (Signed)
gave pt appt for lab and md , emailed Sharyn Lull regarding chemo appt for April

## 2013-11-09 NOTE — Telephone Encounter (Signed)
Per staff message and POF I have scheduled appts.  JMW  

## 2013-11-10 ENCOUNTER — Other Ambulatory Visit: Payer: Self-pay

## 2013-11-10 ENCOUNTER — Telehealth: Payer: Self-pay | Admitting: Hematology and Oncology

## 2013-11-10 NOTE — Telephone Encounter (Signed)
Talked to pt and she is aware of appt for APril 2015 lab,md and chemo

## 2013-11-11 ENCOUNTER — Ambulatory Visit (HOSPITAL_BASED_OUTPATIENT_CLINIC_OR_DEPARTMENT_OTHER): Payer: Medicaid Other

## 2013-11-11 ENCOUNTER — Telehealth: Payer: Self-pay | Admitting: *Deleted

## 2013-11-11 VITALS — BP 110/63 | HR 69 | Temp 98.1°F

## 2013-11-11 DIAGNOSIS — C182 Malignant neoplasm of ascending colon: Secondary | ICD-10-CM

## 2013-11-11 DIAGNOSIS — C787 Secondary malignant neoplasm of liver and intrahepatic bile duct: Secondary | ICD-10-CM

## 2013-11-11 DIAGNOSIS — C189 Malignant neoplasm of colon, unspecified: Secondary | ICD-10-CM

## 2013-11-11 MED ORDER — HEPARIN SOD (PORK) LOCK FLUSH 100 UNIT/ML IV SOLN
500.0000 [IU] | Freq: Once | INTRAVENOUS | Status: AC | PRN
  Administered 2013-11-11: 500 [IU]
  Filled 2013-11-11: qty 5

## 2013-11-11 MED ORDER — SODIUM CHLORIDE 0.9 % IJ SOLN
10.0000 mL | INTRAMUSCULAR | Status: DC | PRN
  Administered 2013-11-11: 10 mL
  Filled 2013-11-11: qty 10

## 2013-11-11 NOTE — Telephone Encounter (Signed)
Flush nurse reports pt c/o "tremors" during her pump d/c appt this morning.  Called pt and she reports she has felt shaky and has had tremors in her hands for about the past week.  Discussed w/ pt that generalized weakness and fatigue r/t chemotherapy and her cancer can cause some shakiness.  Also discussed uncontrolled blood sugars may be contributing to tremors.  Encouraged pt to keep tight control and check on her blood sugars,  Good nutrition and hydration.  Please call back if her tremors get worse. She verbalized understanding.

## 2013-11-17 ENCOUNTER — Ambulatory Visit: Payer: Self-pay | Admitting: Family Medicine

## 2013-11-21 ENCOUNTER — Telehealth: Payer: Self-pay | Admitting: Hematology and Oncology

## 2013-11-21 NOTE — Telephone Encounter (Signed)
pt aware of lab after ct

## 2013-11-22 ENCOUNTER — Other Ambulatory Visit (HOSPITAL_BASED_OUTPATIENT_CLINIC_OR_DEPARTMENT_OTHER): Payer: Medicaid Other

## 2013-11-22 ENCOUNTER — Ambulatory Visit (HOSPITAL_COMMUNITY)
Admission: RE | Admit: 2013-11-22 | Discharge: 2013-11-22 | Disposition: A | Discharge status: Home / Self Care | Payer: Medicaid Other | Source: Ambulatory Visit | Attending: Hematology and Oncology | Admitting: Hematology and Oncology

## 2013-11-22 ENCOUNTER — Ambulatory Visit (HOSPITAL_COMMUNITY): Payer: Medicaid Other

## 2013-11-22 ENCOUNTER — Encounter (HOSPITAL_COMMUNITY): Payer: Self-pay

## 2013-11-22 ENCOUNTER — Other Ambulatory Visit: Payer: Self-pay

## 2013-11-22 DIAGNOSIS — R599 Enlarged lymph nodes, unspecified: Secondary | ICD-10-CM | POA: Insufficient documentation

## 2013-11-22 DIAGNOSIS — C182 Malignant neoplasm of ascending colon: Secondary | ICD-10-CM

## 2013-11-22 DIAGNOSIS — K7689 Other specified diseases of liver: Secondary | ICD-10-CM | POA: Insufficient documentation

## 2013-11-22 DIAGNOSIS — B182 Chronic viral hepatitis C: Secondary | ICD-10-CM | POA: Insufficient documentation

## 2013-11-22 DIAGNOSIS — C189 Malignant neoplasm of colon, unspecified: Secondary | ICD-10-CM | POA: Insufficient documentation

## 2013-11-22 DIAGNOSIS — Z9221 Personal history of antineoplastic chemotherapy: Secondary | ICD-10-CM | POA: Insufficient documentation

## 2013-11-22 DIAGNOSIS — C787 Secondary malignant neoplasm of liver and intrahepatic bile duct: Secondary | ICD-10-CM

## 2013-11-22 LAB — COMPREHENSIVE METABOLIC PANEL (CC13)
ALK PHOS: 103 U/L (ref 40–150)
ALT: 43 U/L (ref 0–55)
AST: 45 U/L — AB (ref 5–34)
Albumin: 3.7 g/dL (ref 3.5–5.0)
Anion Gap: 9 mEq/L (ref 3–11)
BUN: 13.3 mg/dL (ref 7.0–26.0)
CALCIUM: 9.8 mg/dL (ref 8.4–10.4)
CHLORIDE: 101 meq/L (ref 98–109)
CO2: 30 mEq/L — ABNORMAL HIGH (ref 22–29)
CREATININE: 1.3 mg/dL — AB (ref 0.6–1.1)
Glucose: 216 mg/dl — ABNORMAL HIGH (ref 70–140)
Potassium: 4.3 mEq/L (ref 3.5–5.1)
Sodium: 140 mEq/L (ref 136–145)
Total Bilirubin: 0.37 mg/dL (ref 0.20–1.20)
Total Protein: 6.9 g/dL (ref 6.4–8.3)

## 2013-11-22 LAB — CBC & DIFF AND RETIC
BASO%: 0.3 % (ref 0.0–2.0)
Basophils Absolute: 0 10*3/uL (ref 0.0–0.1)
EOS%: 2.1 % (ref 0.0–7.0)
Eosinophils Absolute: 0.1 10*3/uL (ref 0.0–0.5)
HCT: 38.8 % (ref 34.8–46.6)
HGB: 12.8 g/dL (ref 11.6–15.9)
Immature Retic Fract: 2.2 % (ref 1.60–10.00)
LYMPH%: 35.9 % (ref 14.0–49.7)
MCH: 27.4 pg (ref 25.1–34.0)
MCHC: 33 g/dL (ref 31.5–36.0)
MCV: 83.1 fL (ref 79.5–101.0)
MONO#: 0.4 10*3/uL (ref 0.1–0.9)
MONO%: 10 % (ref 0.0–14.0)
NEUT#: 2 10*3/uL (ref 1.5–6.5)
NEUT%: 51.7 % (ref 38.4–76.8)
PLATELETS: 118 10*3/uL — AB (ref 145–400)
RBC: 4.67 10*6/uL (ref 3.70–5.45)
RDW: 15.9 % — ABNORMAL HIGH (ref 11.2–14.5)
Retic %: 1.25 % (ref 0.70–2.10)
Retic Ct Abs: 58.38 10*3/uL (ref 33.70–90.70)
WBC: 3.8 10*3/uL — ABNORMAL LOW (ref 3.9–10.3)
lymph#: 1.4 10*3/uL (ref 0.9–3.3)

## 2013-11-22 LAB — UA PROTEIN, DIPSTICK - CHCC: PROTEIN: NEGATIVE mg/dL

## 2013-11-22 MED ORDER — IOHEXOL 300 MG/ML  SOLN
100.0000 mL | Freq: Once | INTRAMUSCULAR | Status: AC | PRN
  Administered 2013-11-22: 100 mL via INTRAVENOUS

## 2013-11-23 ENCOUNTER — Telehealth: Payer: Self-pay | Admitting: Hematology and Oncology

## 2013-11-23 ENCOUNTER — Ambulatory Visit (HOSPITAL_BASED_OUTPATIENT_CLINIC_OR_DEPARTMENT_OTHER): Payer: Medicaid Other

## 2013-11-23 ENCOUNTER — Ambulatory Visit (HOSPITAL_BASED_OUTPATIENT_CLINIC_OR_DEPARTMENT_OTHER): Payer: Medicaid Other | Admitting: Hematology and Oncology

## 2013-11-23 VITALS — BP 162/90 | HR 87 | Temp 98.4°F | Resp 20 | Ht 65.0 in | Wt 169.4 lb

## 2013-11-23 VITALS — BP 119/66 | HR 64 | Temp 98.4°F | Resp 19

## 2013-11-23 DIAGNOSIS — C189 Malignant neoplasm of colon, unspecified: Secondary | ICD-10-CM

## 2013-11-23 DIAGNOSIS — Z5111 Encounter for antineoplastic chemotherapy: Secondary | ICD-10-CM

## 2013-11-23 DIAGNOSIS — N189 Chronic kidney disease, unspecified: Secondary | ICD-10-CM

## 2013-11-23 DIAGNOSIS — F17200 Nicotine dependence, unspecified, uncomplicated: Secondary | ICD-10-CM

## 2013-11-23 DIAGNOSIS — N183 Chronic kidney disease, stage 3 unspecified: Secondary | ICD-10-CM

## 2013-11-23 DIAGNOSIS — D696 Thrombocytopenia, unspecified: Secondary | ICD-10-CM

## 2013-11-23 DIAGNOSIS — C787 Secondary malignant neoplasm of liver and intrahepatic bile duct: Secondary | ICD-10-CM

## 2013-11-23 DIAGNOSIS — E1149 Type 2 diabetes mellitus with other diabetic neurological complication: Secondary | ICD-10-CM

## 2013-11-23 DIAGNOSIS — C182 Malignant neoplasm of ascending colon: Secondary | ICD-10-CM

## 2013-11-23 DIAGNOSIS — Z5112 Encounter for antineoplastic immunotherapy: Secondary | ICD-10-CM

## 2013-11-23 DIAGNOSIS — E1142 Type 2 diabetes mellitus with diabetic polyneuropathy: Secondary | ICD-10-CM

## 2013-11-23 DIAGNOSIS — T50905A Adverse effect of unspecified drugs, medicaments and biological substances, initial encounter: Secondary | ICD-10-CM

## 2013-11-23 DIAGNOSIS — E119 Type 2 diabetes mellitus without complications: Secondary | ICD-10-CM

## 2013-11-23 DIAGNOSIS — D72819 Decreased white blood cell count, unspecified: Secondary | ICD-10-CM

## 2013-11-23 DIAGNOSIS — T451X5A Adverse effect of antineoplastic and immunosuppressive drugs, initial encounter: Secondary | ICD-10-CM

## 2013-11-23 DIAGNOSIS — D6959 Other secondary thrombocytopenia: Secondary | ICD-10-CM

## 2013-11-23 DIAGNOSIS — D701 Agranulocytosis secondary to cancer chemotherapy: Secondary | ICD-10-CM

## 2013-11-23 DIAGNOSIS — E1340 Other specified diabetes mellitus with diabetic neuropathy, unspecified: Secondary | ICD-10-CM

## 2013-11-23 MED ORDER — SODIUM CHLORIDE 0.9 % IV SOLN
Freq: Once | INTRAVENOUS | Status: AC
  Administered 2013-11-23: 10:00:00 via INTRAVENOUS

## 2013-11-23 MED ORDER — ONDANSETRON 8 MG/50ML IVPB (CHCC)
8.0000 mg | Freq: Once | INTRAVENOUS | Status: AC
  Administered 2013-11-23: 8 mg via INTRAVENOUS

## 2013-11-23 MED ORDER — LEUCOVORIN CALCIUM INJECTION 350 MG
405.0000 mg/m2 | Freq: Once | INTRAVENOUS | Status: AC
  Administered 2013-11-23: 700 mg via INTRAVENOUS
  Filled 2013-11-23: qty 35

## 2013-11-23 MED ORDER — DEXAMETHASONE SODIUM PHOSPHATE 10 MG/ML IJ SOLN
10.0000 mg | Freq: Once | INTRAMUSCULAR | Status: AC
  Administered 2013-11-23: 10 mg via INTRAVENOUS

## 2013-11-23 MED ORDER — DEXAMETHASONE SODIUM PHOSPHATE 10 MG/ML IJ SOLN
INTRAMUSCULAR | Status: AC
  Filled 2013-11-23: qty 1

## 2013-11-23 MED ORDER — OXALIPLATIN CHEMO INJECTION 100 MG/20ML
68.0000 mg/m2 | Freq: Once | INTRAVENOUS | Status: AC
  Administered 2013-11-23: 120 mg via INTRAVENOUS
  Filled 2013-11-23: qty 24

## 2013-11-23 MED ORDER — DEXTROSE 5 % IV SOLN
Freq: Once | INTRAVENOUS | Status: AC
  Administered 2013-11-23: 11:00:00 via INTRAVENOUS

## 2013-11-23 MED ORDER — SODIUM CHLORIDE 0.9 % IV SOLN
5.0000 mg/kg | Freq: Once | INTRAVENOUS | Status: AC
  Administered 2013-11-23: 375 mg via INTRAVENOUS
  Filled 2013-11-23: qty 15

## 2013-11-23 MED ORDER — SODIUM CHLORIDE 0.9 % IV SOLN
2400.0000 mg/m2 | INTRAVENOUS | Status: DC
  Administered 2013-11-23: 4150 mg via INTRAVENOUS
  Filled 2013-11-23: qty 83

## 2013-11-23 MED ORDER — ONDANSETRON 8 MG/NS 50 ML IVPB
INTRAVENOUS | Status: AC
  Filled 2013-11-23: qty 8

## 2013-11-23 NOTE — Telephone Encounter (Signed)
gv adn pritned appt sched and avs for pt for April/May/June....Marland Kitchensed added tx.

## 2013-11-23 NOTE — Patient Instructions (Signed)
[facility] Discharge Instructions for Patients Receiving Chemotherapy  Today you received the following chemotherapy agents: Avastin, Leucovorin, Oxaliplatin, 5FU (Adrucil)  To help prevent nausea and vomiting after your treatment, we encourage you to take your nausea medication: Compazine 10 mg every 6 hrs as needed.    If you develop nausea and vomiting that is not controlled by your nausea medication, call the clinic.   BELOW ARE SYMPTOMS THAT SHOULD BE REPORTED IMMEDIATELY:  *FEVER GREATER THAN 100.5 F  *CHILLS WITH OR WITHOUT FEVER  NAUSEA AND VOMITING THAT IS NOT CONTROLLED WITH YOUR NAUSEA MEDICATION  *UNUSUAL SHORTNESS OF BREATH  *UNUSUAL BRUISING OR BLEEDING  TENDERNESS IN MOUTH AND THROAT WITH OR WITHOUT PRESENCE OF ULCERS  *URINARY PROBLEMS  *BOWEL PROBLEMS  UNUSUAL RASH Items with * indicate a potential emergency and should be followed up as soon as possible.  Feel free to call the clinic you have any questions or concerns. The clinic phone number is [phone].

## 2013-11-23 NOTE — Progress Notes (Signed)
Okay to tx with 11/22/13 labs per Dr. Alvy Bimler. BP WNL before and after Avastin.

## 2013-11-23 NOTE — Progress Notes (Signed)
[place] OFFICE PROGRESS NOTE  Patient Care Team: Angelica Ran, MD as PCP - General (Family Medicine) Windy Kalata, MD as Consulting Physician (Nephrology) Leatrice Jewels Rayetta Pigg, MD as Consulting Physician (Psychiatry) Heath Lark, MD as Consulting Physician (Hematology and Oncology)  DIAGNOSIS: Metastatic colon cancer, ongoing palliative chemotherapy  SUMMARY OF ONCOLOGIC HISTORY: Oncology History   Colon cancer, KRAS positive   Primary site: Colon and Rectum (Right)   Staging method: AJCC 7th Edition   Clinical: (T4b, N2b, M1) signed by Heath Lark, MD on 08/05/2013  9:52 AM   Pathologic: Stage IVB (T4b, N2b, M1b) signed by Heath Lark, MD on 08/05/2013 10:21 AM   Summary: Stage IVB (T4b, N2b, M1b)      Colon cancer   07/15/2013 - 07/25/2013 Hospital Admission The patient presented to the hospital with pain in her abdomen and underwent surgery for colon cancer   07/15/2013 Imaging CT scan of the abdomen show bowel dilatation suspicious for malignancy   07/17/2013 Tumor Marker Preoperative CEA was 2.2, normal   07/19/2013 Procedure Colonoscopy revealed ascending colon lesion with significant stricture and biopsy showed adenocarcinoma   07/20/2013 Surgery She underwent right hemicolectomy, cholecystectomy, liver biopsy, excision of abdomen no wall mass and omental mass. During surgery, she was also found to have drop metastasis   08/29/2013 Imaging Repeat CT scan show persistent liver metastasis and new omental metastasis   08/31/2013 -  Chemotherapy The patient was started on palliative chemotherapy with FOLFOX, bolus 5-FU committed and oxaliplatin dose reduced due to  peripheral neuropathy   11/22/2013 Imaging Repeat CT scan showed near complete response to treatment.   11/23/2013 -  Chemotherapy Avastin was added to her chemotherapy regimen    INTERVAL HISTORY: Monica [name] 53 y.o. female returns for further followup. She has significant anxiety today  and she was late for appointment. She denies worsening peripheral neuropathy. She denies severe hyperglycemia recently. Overall, she has very minimal side effects from treatment.  I have reviewed the past medical history, past surgical history, social history and family history with the patient and they are unchanged from previous note.  ALLERGIES:  has No Known Allergies.  MEDICATIONS:  Current Outpatient Prescriptions  Medication Sig Dispense Refill  . ACCU-CHEK AVIVA PLUS test strip USE AS DIRECTED  100 each  10  . atorvastatin (LIPITOR) 40 MG tablet Take 1 tablet (40 mg total) by mouth daily.  90 tablet  3  . buprenorphine-naloxone (SUBOXONE) 8-2 MG SUBL SL tablet Place 1 tablet under the tongue 3 (three) times daily.      . clonazePAM (KLONOPIN) 1 MG tablet Take 1 mg by mouth 3 (three) times daily as needed for anxiety.       . diazepam (VALIUM) 10 MG tablet Take 10 mg by mouth 3 (three) times daily.      Marland Kitchen docusate sodium (COLACE) 100 MG capsule TAKE ONE CAPSULE BY MOUTH TWICE A DAY  60 capsule  5  . furosemide (LASIX) 40 MG tablet Take 1 tablet (40 mg total) by mouth 2 (two) times daily.  60 tablet  5  . gabapentin (NEURONTIN) 300 MG capsule Take 2 capsules (600 mg total) by mouth 2 (two) times daily.  120 capsule  5  . HUMALOG 100 UNIT/ML injection INJECT 2-8 UNITS INTO THE SKIN DAILY. PT USES SLIDING SCALE  10 mL  3  . hydrOXYzine (ATARAX/VISTARIL) 25 MG tablet Take 50 mg by mouth 3 (three) times daily as needed for anxiety.      Marland Kitchen  ibuprofen (ADVIL,MOTRIN) 800 MG tablet Take 800 mg by mouth every 8 (eight) hours as needed for fever, headache, mild pain or moderate pain.      Marland Kitchen insulin glargine (LANTUS) 100 UNIT/ML injection Inject 25 Units into the skin daily.       Marland Kitchen lamoTRIgine (LAMICTAL) 100 MG tablet Take 100 mg by mouth 2 (two) times daily.      Marland Kitchen lidocaine-prilocaine (EMLA) cream Apply 1 application topically as needed (for pain).      Marland Kitchen omeprazole (PRILOSEC) 20 MG capsule  Take 20 mg by mouth daily.      . ondansetron (ZOFRAN) 8 MG tablet Take 1 tablet (8 mg total) by mouth every 8 (eight) hours as needed.  30 tablet  1  . OVER THE COUNTER MEDICATION Apply 1 application to eye daily.      . polyethylene glycol powder (GLYCOLAX/MIRALAX) powder USE AS DIRECTED  255 g  3  . Prenatal Vit-Fe Fumarate-FA (PRENATAL MULTIVITAMIN) TABS tablet Take 1 tablet by mouth daily at 12 noon.      . prochlorperazine (COMPAZINE) 10 MG tablet Take 1 tablet (10 mg total) by mouth every 6 (six) hours as needed (Nausea or vomiting).  30 tablet  1  . ramelteon (ROZEREM) 8 MG tablet Take 8 mg by mouth at bedtime.      . ziprasidone (GEODON) 80 MG capsule Take 80 mg by mouth at bedtime.        No current facility-administered medications for this visit.   Facility-Administered Medications Ordered in Other Visits  Medication Dose Route Frequency Provider Last Rate Last Dose  . bevacizumab (AVASTIN) 375 mg in sodium chloride 0.9 % 100 mL chemo infusion  5 mg/kg (Treatment Plan Actual) Intravenous Once Heath Lark, MD      . dextrose 5 % solution   Intravenous Once Heath Lark, MD      . fluorouracil (ADRUCIL) 4,150 mg in sodium chloride 0.9 % 150 mL chemo infusion  2,400 mg/m2 (Treatment Plan Adjusted) Intravenous 1 day or 1 dose Heath Lark, MD      . leucovorin 700 mg in dextrose 5 % 250 mL infusion  405 mg/m2 (Treatment Plan Adjusted) Intravenous Once Heath Lark, MD      . ondansetron (ZOFRAN) IVPB 8 mg  8 mg Intravenous Once Heath Lark, MD      . oxaliplatin (ELOXATIN) 120 mg in dextrose 5 % 500 mL chemo infusion  68 mg/m2 (Treatment Plan Adjusted) Intravenous Once Heath Lark, MD        REVIEW OF SYSTEMS:   Constitutional: Denies fevers, chills or abnormal weight loss Eyes: Denies blurriness of vision Ears, nose, mouth, throat, and face: Denies mucositis or sore throat Respiratory: Denies cough, dyspnea or wheezes Cardiovascular: Denies palpitation, chest discomfort or lower extremity  swelling Gastrointestinal:  Denies nausea, heartburn or change in bowel habits Skin: Denies abnormal skin rashes Lymphatics: Denies new lymphadenopathy or easy bruising Neurological:Denies numbness, tingling or new weaknesses Behavioral/Psych: Mood is stable, no new changes  All other systems were reviewed with the patient and are negative.  PHYSICAL EXAMINATION: ECOG PERFORMANCE STATUS: 0 - Asymptomatic  Filed Vitals:   11/23/13 0915  BP: 162/90  Pulse: 87  Temp: 98.4 F (36.9 C)  Resp: 20   Filed Weights   11/23/13 0915  Weight: 169 lb 6.4 oz (76.839 kg)    GENERAL:alert, no distress and comfortable SKIN: skin color, texture, turgor are normal, no rashes or significant lesions EYES: normal, Conjunctiva are pink and non-injected,  sclera clear OROPHARYNX:no exudate, no erythema and lips, buccal mucosa, and tongue normal  NECK: supple, thyroid normal size, non-tender, without nodularity LYMPH:  no palpable lymphadenopathy in the cervical, axillary or inguinal LUNGS: clear to auscultation and percussion with normal breathing effort HEART: regular rate & rhythm and no murmurs and no lower extremity edema ABDOMEN:abdomen soft, non-tender and normal bowel sounds Musculoskeletal:no cyanosis of digits and no clubbing  NEURO: alert & oriented x 3 with fluent speech, no focal motor/sensory deficits  LABORATORY DATA:  I have reviewed the data as listed    Component Value Date/Time   NA 140 11/22/2013 0859   NA 143 08/12/2013 1005   K 4.3 11/22/2013 0859   K 4.2 08/12/2013 1005   CL 105 08/12/2013 1005   CO2 30* 11/22/2013 0859   CO2 27 08/12/2013 1005   GLUCOSE 216* 11/22/2013 0859   GLUCOSE 95 08/12/2013 1005   BUN 13.3 11/22/2013 0859   BUN 13 08/12/2013 1005   CREATININE 1.3* 11/22/2013 0859   CREATININE 1.14* 08/12/2013 1005   CREATININE 1.38* 05/26/2013 1703   CALCIUM 9.8 11/22/2013 0859   CALCIUM 8.7 08/12/2013 1005   PROT 6.9 11/22/2013 0859   PROT 6.4 08/12/2013 1005   ALBUMIN 3.7  11/22/2013 0859   ALBUMIN 2.9* 08/12/2013 1005   AST 45* 11/22/2013 0859   AST 22 08/12/2013 1005   ALT 43 11/22/2013 0859   ALT 23 08/12/2013 1005   ALKPHOS 103 11/22/2013 0859   ALKPHOS 199* 08/12/2013 1005   BILITOT 0.37 11/22/2013 0859   BILITOT 0.2* 08/12/2013 1005   GFRNONAA 55* 08/12/2013 1005   GFRNONAA 44* 05/26/2013 1703   GFRAA 63* 08/12/2013 1005   GFRAA 51* 05/26/2013 1703    No results found for this basename: SPEP,  UPEP,   kappa and lambda light chains    Lab Results  Component Value Date   WBC 3.8* 11/22/2013   NEUTROABS 2.0 11/22/2013   HGB 12.8 11/22/2013   HCT 38.8 11/22/2013   MCV 83.1 11/22/2013   PLT 118* 11/22/2013      Chemistry      Component Value Date/Time   NA 140 11/22/2013 0859   NA 143 08/12/2013 1005   K 4.3 11/22/2013 0859   K 4.2 08/12/2013 1005   CL 105 08/12/2013 1005   CO2 30* 11/22/2013 0859   CO2 27 08/12/2013 1005   BUN 13.3 11/22/2013 0859   BUN 13 08/12/2013 1005   CREATININE 1.3* 11/22/2013 0859   CREATININE 1.14* 08/12/2013 1005   CREATININE 1.38* 05/26/2013 1703      Component Value Date/Time   CALCIUM 9.8 11/22/2013 0859   CALCIUM 8.7 08/12/2013 1005   ALKPHOS 103 11/22/2013 0859   ALKPHOS 199* 08/12/2013 1005   AST 45* 11/22/2013 0859   AST 22 08/12/2013 1005   ALT 43 11/22/2013 0859   ALT 23 08/12/2013 1005   BILITOT 0.37 11/22/2013 0859   BILITOT 0.2* 08/12/2013 1005       RADIOGRAPHIC STUDIES: I reviewed the imaging with the patient I have personally reviewed the radiological images as listed and agreed with the findings in the report. Ct Chest W Contrast  11/22/2013   CLINICAL DATA:  Followup metastatic colon carcinoma. Chemotherapy in progress. Restaging. Chronic hepatitis-C.  EXAM: CT CHEST, ABDOMEN, AND PELVIS WITH CONTRAST  TECHNIQUE: Multidetector CT imaging of the chest, abdomen and pelvis was performed following the standard protocol during bolus administration of intravenous contrast.  CONTRAST:  123m OMNIPAQUE IOHEXOL 300 MG/ML  SOLN  COMPARISON:   08/29/2013  FINDINGS: CT CHEST FINDINGS  Previously noted areas of nodularity seen along the right pleural surface are less conspicuous than on previous study. No discrete pleural masses are seen and there is no evidence of pleural effusion. Mild bibasilar scarring is noted, but no suspicious pulmonary nodules or masses are identified. There is no evidence of acute infiltrate or central endobronchial lesion.  Shotty small less than 1 cm lymph nodes in the mediastinum and both axillary regions are stable. No pathologically enlarged nodes or other masses visualized within the thorax. No suspicious bone lesions identified.  CT ABDOMEN AND PELVIS FINDINGS  Hypovascular mass in the central right hepatic lobe now measures 1.2 x 1.2 cm on image 53 of series 2 compared to2.2 x 2.2 cm previously. No other liver masses are identified. Surgical clips again seen from prior cholecystectomy. No evidence of biliary ductal dilatation.  The pancreas, spleen, adrenal glands, and kidneys are normal in appearance. No evidence hydronephrosis.  Shotty small less than 1 cm retroperitoneal lymph nodes remain stable. Decreased in lymphadenopathy is seen in the left common iliac chain, with largest lymph node now measuring 7 mm on image 86 compared to 1.7 cm previously. Decreased soft tissue density is also seen in both adnexal regions and adjacent to the uterine fundus also consistent with improving metastatic disease. Decreased adenopathy also noted in left external iliac chain, with index lymph node currently measuring 10 mm on image 102 compared to 1.7 cm on previous study.  Soft tissue nodularity previously seen scattered throughout the omental fat has nearly completely resolved, consistent with decreased peritoneal metastatic disease. No evidence of ascites.  No evidence of acute inflammatory process or abscess. No evidence of bowel obstruction. Large stool burden again noted. No suspicious bone lesions identified.  IMPRESSION: No new  or progressive disease identified within the chest, abdomen, or pelvis.  Near complete resolution of soft tissue nodularity along the right pleural surface.  Decreased size of central right hepatic lobe metastasis.  Decreased pelvic lymphadenopathy, as well as soft tissue densities in both adnexal regions and adjacent to the uterine fundus.  Near complete resolution of peritoneal metastatic disease involving omentum.   Electronically Signed   By: Earle Gell M.D.   On: 11/22/2013 09:41   Ct Abdomen Pelvis W Contrast  11/22/2013   CLINICAL DATA:  Followup metastatic colon carcinoma. Chemotherapy in progress. Restaging. Chronic hepatitis-C.  EXAM: CT CHEST, ABDOMEN, AND PELVIS WITH CONTRAST  TECHNIQUE: Multidetector CT imaging of the chest, abdomen and pelvis was performed following the standard protocol during bolus administration of intravenous contrast.  CONTRAST:  172m OMNIPAQUE IOHEXOL 300 MG/ML  SOLN  COMPARISON:  08/29/2013  FINDINGS: CT CHEST FINDINGS  Previously noted areas of nodularity seen along the right pleural surface are less conspicuous than on previous study. No discrete pleural masses are seen and there is no evidence of pleural effusion. Mild bibasilar scarring is noted, but no suspicious pulmonary nodules or masses are identified. There is no evidence of acute infiltrate or central endobronchial lesion.  Shotty small less than 1 cm lymph nodes in the mediastinum and both axillary regions are stable. No pathologically enlarged nodes or other masses visualized within the thorax. No suspicious bone lesions identified.  CT ABDOMEN AND PELVIS FINDINGS  Hypovascular mass in the central right hepatic lobe now measures 1.2 x 1.2 cm on image 53 of series 2 compared to2.2 x 2.2 cm previously. No other liver masses are identified. Surgical clips again  seen from prior cholecystectomy. No evidence of biliary ductal dilatation.  The pancreas, spleen, adrenal glands, and kidneys are normal in appearance. No  evidence hydronephrosis.  Shotty small less than 1 cm retroperitoneal lymph nodes remain stable. Decreased in lymphadenopathy is seen in the left common iliac chain, with largest lymph node now measuring 7 mm on image 86 compared to 1.7 cm previously. Decreased soft tissue density is also seen in both adnexal regions and adjacent to the uterine fundus also consistent with improving metastatic disease. Decreased adenopathy also noted in left external iliac chain, with index lymph node currently measuring 10 mm on image 102 compared to 1.7 cm on previous study.  Soft tissue nodularity previously seen scattered throughout the omental fat has nearly completely resolved, consistent with decreased peritoneal metastatic disease. No evidence of ascites.  No evidence of acute inflammatory process or abscess. No evidence of bowel obstruction. Large stool burden again noted. No suspicious bone lesions identified.  IMPRESSION: No new or progressive disease identified within the chest, abdomen, or pelvis.  Near complete resolution of soft tissue nodularity along the right pleural surface.  Decreased size of central right hepatic lobe metastasis.  Decreased pelvic lymphadenopathy, as well as soft tissue densities in both adnexal regions and adjacent to the uterine fundus.  Near complete resolution of peritoneal metastatic disease involving omentum.   Electronically Signed   By: Earle Gell M.D.   On: 11/22/2013 09:41      ASSESSMENT & PLAN:  #1 metastatic colon cancer She tolerated treatment well. I will continue with slight dose adjustment due to extra weight gain. The patient is pleased to see the excellent response to treatment. We discussed the role of addition of Avastin. The intent is palliative.  We discussed some of the risks, benefits, side-effects of Avastin.  Some of the short term side-effects included, though not limited to, risk of fatigue, pancytopenia, life-threatening infections, allergic reactions,  nausea, vomiting, sores in the mouth, changes in bowel habits especially diarrhea, admission to hospital for various reasons, and risks of death.   The patient is aware that the response rates discussed earlier is not guaranteed.    After a long discussion, patient made an informed decision to proceed with the prescribed plan of care and went ahead to sign the consent form today.  #2 poorly controlled diabetes She will continue insulin adjustments as directed by her primary care provider #3 chronic kidney disease This is stable and not worse. I encourage increase oral intake #4 tobacco abuse The patient will continue her effort to stop smoking #5 mild peripheral neuropathy The patient had diabetic neuropathy even before we start her on treatment. I recommend we continue current dosage adjustment for oxaliplatin. #6 mild thrombocytopenia This is likely due to recent treatment. The patient denies recent history of bleeding such as epistaxis, hematuria or hematochezia. She is asymptomatic from the low platelet count. I will observe for now.  she does not require transfusion now. I will continue the chemotherapy at current dose without dosage adjustment.  If the thrombocytopenia gets progressive worse in the future, I might have to delay her treatment or adjust the chemotherapy dose. #7 mild leukopenia This is related to treatment. I recommend observation only  All questions were answered. The patient knows to call the clinic with any problems, questions or concerns. No barriers to learning was detected. I spent 40 minutes counseling the patient face to face. The total time spent in the appointment was 55 minutes and more than  50% was on counseling and review of test results     Heath Lark, MD 11/23/2013 10:34 AM

## 2013-11-24 ENCOUNTER — Telehealth: Payer: Self-pay | Admitting: *Deleted

## 2013-11-24 NOTE — Telephone Encounter (Signed)
Spoke with pt today for post chemo follow up call.  Pt had new chemo Avastin yesterday.  Pt stated she was doing fine except little bit fatigue.  Stated good appetite and drinking fluids as tolerated.  Reinforced need to drink lots po fluids especially after chemo.  Denied nausea/vomiting; denied pain.  Stated bowel and bladder function fine.  Pt aware of return appt.

## 2013-11-25 ENCOUNTER — Ambulatory Visit (HOSPITAL_BASED_OUTPATIENT_CLINIC_OR_DEPARTMENT_OTHER): Payer: Medicaid Other

## 2013-11-25 VITALS — BP 135/96 | HR 58 | Temp 97.4°F | Resp 16

## 2013-11-25 DIAGNOSIS — C182 Malignant neoplasm of ascending colon: Secondary | ICD-10-CM

## 2013-11-25 DIAGNOSIS — C189 Malignant neoplasm of colon, unspecified: Secondary | ICD-10-CM

## 2013-11-25 MED ORDER — HEPARIN SOD (PORK) LOCK FLUSH 100 UNIT/ML IV SOLN
500.0000 [IU] | Freq: Once | INTRAVENOUS | Status: AC | PRN
  Administered 2013-11-25: 500 [IU]
  Filled 2013-11-25: qty 5

## 2013-11-25 MED ORDER — SODIUM CHLORIDE 0.9 % IJ SOLN
10.0000 mL | INTRAMUSCULAR | Status: DC | PRN
  Administered 2013-11-25: 10 mL
  Filled 2013-11-25: qty 10

## 2013-12-07 ENCOUNTER — Other Ambulatory Visit (HOSPITAL_BASED_OUTPATIENT_CLINIC_OR_DEPARTMENT_OTHER): Payer: Medicaid Other

## 2013-12-07 ENCOUNTER — Ambulatory Visit (HOSPITAL_BASED_OUTPATIENT_CLINIC_OR_DEPARTMENT_OTHER): Payer: Medicaid Other

## 2013-12-07 ENCOUNTER — Other Ambulatory Visit: Payer: Self-pay | Admitting: Family Medicine

## 2013-12-07 ENCOUNTER — Other Ambulatory Visit: Payer: Self-pay | Admitting: Hematology and Oncology

## 2013-12-07 VITALS — BP 120/82 | HR 76 | Temp 97.4°F | Resp 18

## 2013-12-07 DIAGNOSIS — C787 Secondary malignant neoplasm of liver and intrahepatic bile duct: Secondary | ICD-10-CM

## 2013-12-07 DIAGNOSIS — Z5111 Encounter for antineoplastic chemotherapy: Secondary | ICD-10-CM

## 2013-12-07 DIAGNOSIS — C189 Malignant neoplasm of colon, unspecified: Secondary | ICD-10-CM

## 2013-12-07 DIAGNOSIS — E119 Type 2 diabetes mellitus without complications: Secondary | ICD-10-CM

## 2013-12-07 DIAGNOSIS — C182 Malignant neoplasm of ascending colon: Secondary | ICD-10-CM

## 2013-12-07 DIAGNOSIS — Z5112 Encounter for antineoplastic immunotherapy: Secondary | ICD-10-CM

## 2013-12-07 LAB — COMPREHENSIVE METABOLIC PANEL (CC13)
ALBUMIN: 3.7 g/dL (ref 3.5–5.0)
ALT: 44 U/L (ref 0–55)
AST: 42 U/L — ABNORMAL HIGH (ref 5–34)
Alkaline Phosphatase: 134 U/L (ref 40–150)
Anion Gap: 13 mEq/L — ABNORMAL HIGH (ref 3–11)
BUN: 17.1 mg/dL (ref 7.0–26.0)
CALCIUM: 9.6 mg/dL (ref 8.4–10.4)
CHLORIDE: 106 meq/L (ref 98–109)
CO2: 27 mEq/L (ref 22–29)
Creatinine: 1.2 mg/dL — ABNORMAL HIGH (ref 0.6–1.1)
Glucose: 116 mg/dl (ref 70–140)
Potassium: 3.8 mEq/L (ref 3.5–5.1)
SODIUM: 146 meq/L — AB (ref 136–145)
TOTAL PROTEIN: 7.3 g/dL (ref 6.4–8.3)
Total Bilirubin: 0.25 mg/dL (ref 0.20–1.20)

## 2013-12-07 LAB — CBC & DIFF AND RETIC
BASO%: 0.3 % (ref 0.0–2.0)
Basophils Absolute: 0 10*3/uL (ref 0.0–0.1)
EOS%: 2.1 % (ref 0.0–7.0)
Eosinophils Absolute: 0.1 10*3/uL (ref 0.0–0.5)
HCT: 41.7 % (ref 34.8–46.6)
HGB: 14 g/dL (ref 11.6–15.9)
Immature Retic Fract: 3.9 % (ref 1.60–10.00)
LYMPH%: 39.8 % (ref 14.0–49.7)
MCH: 28.2 pg (ref 25.1–34.0)
MCHC: 33.6 g/dL (ref 31.5–36.0)
MCV: 83.9 fL (ref 79.5–101.0)
MONO#: 0.5 10*3/uL (ref 0.1–0.9)
MONO%: 11.9 % (ref 0.0–14.0)
NEUT%: 45.9 % (ref 38.4–76.8)
NEUTROS ABS: 1.8 10*3/uL (ref 1.5–6.5)
PLATELETS: 133 10*3/uL — AB (ref 145–400)
RBC: 4.97 10*6/uL (ref 3.70–5.45)
RDW: 16.4 % — ABNORMAL HIGH (ref 11.2–14.5)
Retic %: 1.28 % (ref 0.70–2.10)
Retic Ct Abs: 63.62 10*3/uL (ref 33.70–90.70)
WBC: 3.9 10*3/uL (ref 3.9–10.3)
lymph#: 1.5 10*3/uL (ref 0.9–3.3)
nRBC: 0 % (ref 0–0)

## 2013-12-07 LAB — UA PROTEIN, DIPSTICK - CHCC: PROTEIN: NEGATIVE mg/dL

## 2013-12-07 MED ORDER — OXALIPLATIN CHEMO INJECTION 100 MG/20ML
68.0000 mg/m2 | Freq: Once | INTRAVENOUS | Status: AC
  Administered 2013-12-07: 120 mg via INTRAVENOUS
  Filled 2013-12-07: qty 24

## 2013-12-07 MED ORDER — ONDANSETRON 8 MG/NS 50 ML IVPB
INTRAVENOUS | Status: AC
  Filled 2013-12-07: qty 8

## 2013-12-07 MED ORDER — SODIUM CHLORIDE 0.9 % IV SOLN
5.0000 mg/kg | Freq: Once | INTRAVENOUS | Status: AC
  Administered 2013-12-07: 375 mg via INTRAVENOUS
  Filled 2013-12-07: qty 15

## 2013-12-07 MED ORDER — DEXTROSE 5 % IV SOLN
Freq: Once | INTRAVENOUS | Status: AC
  Administered 2013-12-07: 11:00:00 via INTRAVENOUS

## 2013-12-07 MED ORDER — SODIUM CHLORIDE 0.9 % IV SOLN
Freq: Once | INTRAVENOUS | Status: AC
  Administered 2013-12-07: 10:00:00 via INTRAVENOUS

## 2013-12-07 MED ORDER — DEXAMETHASONE SODIUM PHOSPHATE 10 MG/ML IJ SOLN
INTRAMUSCULAR | Status: AC
  Filled 2013-12-07: qty 1

## 2013-12-07 MED ORDER — ONDANSETRON 8 MG/50ML IVPB (CHCC)
8.0000 mg | Freq: Once | INTRAVENOUS | Status: AC
  Administered 2013-12-07: 8 mg via INTRAVENOUS

## 2013-12-07 MED ORDER — SODIUM CHLORIDE 0.9 % IV SOLN
2400.0000 mg/m2 | INTRAVENOUS | Status: DC
  Administered 2013-12-07: 4150 mg via INTRAVENOUS
  Filled 2013-12-07: qty 83

## 2013-12-07 MED ORDER — LEUCOVORIN CALCIUM INJECTION 350 MG
405.0000 mg/m2 | Freq: Once | INTRAVENOUS | Status: AC
  Administered 2013-12-07: 700 mg via INTRAVENOUS
  Filled 2013-12-07: qty 35

## 2013-12-07 MED ORDER — DEXAMETHASONE SODIUM PHOSPHATE 10 MG/ML IJ SOLN
10.0000 mg | Freq: Once | INTRAMUSCULAR | Status: AC
  Administered 2013-12-07: 10 mg via INTRAVENOUS

## 2013-12-07 NOTE — Patient Instructions (Signed)
[facility] Discharge Instructions for Patients Receiving Chemotherapy  Today you received the following chemotherapy agents;  Avastin,  Fluorouracil, Oxaliplatin and Leucovorin.  Home on continuous Fluorouracil (5-FU) pump).    To help prevent nausea and vomiting after your treatment, we encourage you to take your nausea medication as directed.    If you develop nausea and vomiting that is not controlled by your nausea medication, call the clinic.   BELOW ARE SYMPTOMS THAT SHOULD BE REPORTED IMMEDIATELY:  *FEVER GREATER THAN 100.5 F  *CHILLS WITH OR WITHOUT FEVER  NAUSEA AND VOMITING THAT IS NOT CONTROLLED WITH YOUR NAUSEA MEDICATION  *UNUSUAL SHORTNESS OF BREATH  *UNUSUAL BRUISING OR BLEEDING  TENDERNESS IN MOUTH AND THROAT WITH OR WITHOUT PRESENCE OF ULCERS  *URINARY PROBLEMS  *BOWEL PROBLEMS  UNUSUAL RASH Items with * indicate a potential emergency and should be followed up as soon as possible.  Feel free to call the clinic you have any questions or concerns. The clinic phone number is [phone].

## 2013-12-09 ENCOUNTER — Ambulatory Visit (HOSPITAL_BASED_OUTPATIENT_CLINIC_OR_DEPARTMENT_OTHER): Payer: Medicaid Other

## 2013-12-09 VITALS — BP 134/77 | HR 70

## 2013-12-09 DIAGNOSIS — C189 Malignant neoplasm of colon, unspecified: Secondary | ICD-10-CM

## 2013-12-09 DIAGNOSIS — C182 Malignant neoplasm of ascending colon: Secondary | ICD-10-CM

## 2013-12-09 MED ORDER — SODIUM CHLORIDE 0.9 % IJ SOLN
10.0000 mL | INTRAMUSCULAR | Status: DC | PRN
  Administered 2013-12-09: 10 mL
  Filled 2013-12-09: qty 10

## 2013-12-09 MED ORDER — HEPARIN SOD (PORK) LOCK FLUSH 100 UNIT/ML IV SOLN
500.0000 [IU] | Freq: Once | INTRAVENOUS | Status: AC | PRN
  Administered 2013-12-09: 500 [IU]
  Filled 2013-12-09: qty 5

## 2013-12-21 ENCOUNTER — Ambulatory Visit (HOSPITAL_BASED_OUTPATIENT_CLINIC_OR_DEPARTMENT_OTHER): Payer: Medicaid Other | Admitting: Hematology and Oncology

## 2013-12-21 ENCOUNTER — Telehealth: Payer: Self-pay | Admitting: *Deleted

## 2013-12-21 ENCOUNTER — Other Ambulatory Visit (HOSPITAL_BASED_OUTPATIENT_CLINIC_OR_DEPARTMENT_OTHER): Payer: Medicaid Other

## 2013-12-21 ENCOUNTER — Other Ambulatory Visit: Payer: Self-pay | Admitting: *Deleted

## 2013-12-21 ENCOUNTER — Telehealth: Payer: Self-pay | Admitting: Hematology and Oncology

## 2013-12-21 ENCOUNTER — Ambulatory Visit (HOSPITAL_BASED_OUTPATIENT_CLINIC_OR_DEPARTMENT_OTHER): Payer: Medicaid Other

## 2013-12-21 VITALS — BP 124/68 | HR 68 | Temp 97.2°F | Resp 20 | Ht 65.0 in | Wt 173.6 lb

## 2013-12-21 DIAGNOSIS — Z5111 Encounter for antineoplastic chemotherapy: Secondary | ICD-10-CM

## 2013-12-21 DIAGNOSIS — C189 Malignant neoplasm of colon, unspecified: Secondary | ICD-10-CM

## 2013-12-21 DIAGNOSIS — D696 Thrombocytopenia, unspecified: Secondary | ICD-10-CM

## 2013-12-21 DIAGNOSIS — C182 Malignant neoplasm of ascending colon: Secondary | ICD-10-CM

## 2013-12-21 DIAGNOSIS — C787 Secondary malignant neoplasm of liver and intrahepatic bile duct: Secondary | ICD-10-CM

## 2013-12-21 DIAGNOSIS — E119 Type 2 diabetes mellitus without complications: Secondary | ICD-10-CM

## 2013-12-21 DIAGNOSIS — D72819 Decreased white blood cell count, unspecified: Secondary | ICD-10-CM

## 2013-12-21 DIAGNOSIS — G609 Hereditary and idiopathic neuropathy, unspecified: Secondary | ICD-10-CM

## 2013-12-21 DIAGNOSIS — Z5112 Encounter for antineoplastic immunotherapy: Secondary | ICD-10-CM

## 2013-12-21 DIAGNOSIS — N189 Chronic kidney disease, unspecified: Secondary | ICD-10-CM

## 2013-12-21 LAB — COMPREHENSIVE METABOLIC PANEL (CC13)
ALBUMIN: 3.2 g/dL — AB (ref 3.5–5.0)
ALT: 26 U/L (ref 0–55)
AST: 44 U/L — ABNORMAL HIGH (ref 5–34)
Alkaline Phosphatase: 101 U/L (ref 40–150)
Anion Gap: 15 mEq/L — ABNORMAL HIGH (ref 3–11)
BUN: 13.1 mg/dL (ref 7.0–26.0)
CHLORIDE: 104 meq/L (ref 98–109)
CO2: 27 mEq/L (ref 22–29)
Calcium: 9.2 mg/dL (ref 8.4–10.4)
Creatinine: 1.2 mg/dL — ABNORMAL HIGH (ref 0.6–1.1)
GLUCOSE: 157 mg/dL — AB (ref 70–140)
Potassium: 3.9 mEq/L (ref 3.5–5.1)
Sodium: 146 mEq/L — ABNORMAL HIGH (ref 136–145)
Total Bilirubin: 0.27 mg/dL (ref 0.20–1.20)
Total Protein: 6.5 g/dL (ref 6.4–8.3)

## 2013-12-21 LAB — CBC & DIFF AND RETIC
BASO%: 0.6 % (ref 0.0–2.0)
Basophils Absolute: 0 10*3/uL (ref 0.0–0.1)
EOS%: 2.7 % (ref 0.0–7.0)
Eosinophils Absolute: 0.1 10*3/uL (ref 0.0–0.5)
HCT: 36.4 % (ref 34.8–46.6)
HGB: 12.3 g/dL (ref 11.6–15.9)
Immature Retic Fract: 2.8 % (ref 1.60–10.00)
LYMPH%: 41.1 % (ref 14.0–49.7)
MCH: 28.5 pg (ref 25.1–34.0)
MCHC: 33.8 g/dL (ref 31.5–36.0)
MCV: 84.5 fL (ref 79.5–101.0)
MONO#: 0.5 10*3/uL (ref 0.1–0.9)
MONO%: 14.8 % — AB (ref 0.0–14.0)
NEUT%: 40.8 % (ref 38.4–76.8)
NEUTROS ABS: 1.4 10*3/uL — AB (ref 1.5–6.5)
NRBC: 0 % (ref 0–0)
Platelets: 114 10*3/uL — ABNORMAL LOW (ref 145–400)
RBC: 4.31 10*6/uL (ref 3.70–5.45)
RDW: 16 % — ABNORMAL HIGH (ref 11.2–14.5)
RETIC %: 0.88 % (ref 0.70–2.10)
Retic Ct Abs: 37.93 10*3/uL (ref 33.70–90.70)
WBC: 3.4 10*3/uL — ABNORMAL LOW (ref 3.9–10.3)
lymph#: 1.4 10*3/uL (ref 0.9–3.3)

## 2013-12-21 LAB — UA PROTEIN, DIPSTICK - CHCC: Protein, ur: NEGATIVE mg/dL

## 2013-12-21 MED ORDER — ONDANSETRON 8 MG/NS 50 ML IVPB
INTRAVENOUS | Status: AC
  Filled 2013-12-21: qty 8

## 2013-12-21 MED ORDER — DEXTROSE 5 % IV SOLN: Freq: Once | INTRAVENOUS | Status: DC

## 2013-12-21 MED ORDER — SODIUM CHLORIDE 0.9 % IV SOLN
Freq: Once | INTRAVENOUS | Status: AC
  Administered 2013-12-21: 10:00:00 via INTRAVENOUS

## 2013-12-21 MED ORDER — DEXAMETHASONE SODIUM PHOSPHATE 10 MG/ML IJ SOLN
INTRAMUSCULAR | Status: AC
  Filled 2013-12-21: qty 1

## 2013-12-21 MED ORDER — SODIUM CHLORIDE 0.9 % IV SOLN
5.0000 mg/kg | Freq: Once | INTRAVENOUS | Status: AC
  Administered 2013-12-21: 375 mg via INTRAVENOUS
  Filled 2013-12-21: qty 15

## 2013-12-21 MED ORDER — LEUCOVORIN CALCIUM INJECTION 350 MG
405.0000 mg/m2 | Freq: Once | INTRAVENOUS | Status: AC
  Administered 2013-12-21: 700 mg via INTRAVENOUS
  Filled 2013-12-21: qty 35

## 2013-12-21 MED ORDER — DEXAMETHASONE SODIUM PHOSPHATE 10 MG/ML IJ SOLN
10.0000 mg | Freq: Once | INTRAMUSCULAR | Status: AC
  Administered 2013-12-21: 10 mg via INTRAVENOUS

## 2013-12-21 MED ORDER — ONDANSETRON 8 MG/50ML IVPB (CHCC)
8.0000 mg | Freq: Once | INTRAVENOUS | Status: AC
  Administered 2013-12-21: 8 mg via INTRAVENOUS

## 2013-12-21 MED ORDER — SODIUM CHLORIDE 0.9 % IV SOLN
2400.0000 mg/m2 | INTRAVENOUS | Status: DC
  Administered 2013-12-21: 4150 mg via INTRAVENOUS
  Filled 2013-12-21: qty 83

## 2013-12-21 NOTE — Telephone Encounter (Signed)
gave pt appt for for lab,md chemo for May and June emailed [facility] regarding chemo

## 2013-12-21 NOTE — Telephone Encounter (Signed)
Explained to pt Monica [name] closed on 7/03 and her scheduled chemo on 7/01 would require pump d/c on 7/03.  Informed her that Dr. Alvy Bimler said we could move her chemo from Wed 7/01 to [facility] 7/02 and then adjust all subsequent chemo to Thursdays.  Other option is to delay treatment by one more week and start again the following Wed 7/08.  Pt states she would rather wait a week and keep her treatments on Wednesdays if this is ok w/ Dr. Alvy Bimler.   POF sent to delay by one week to 7/08.

## 2013-12-21 NOTE — Telephone Encounter (Signed)
Per staff message and POF I have scheduled appts.  JMW  

## 2013-12-21 NOTE — Patient Instructions (Addendum)
[facility] Discharge Instructions for Patients Receiving Chemotherapy  Today you received the following chemotherapy agents Leucovorin, 5 FU,  Avastin. To help prevent nausea and vomiting after your treatment, we encourage you to take your nausea medication as directed/prescribed.  If you develop nausea and vomiting that is not controlled by your nausea medication, call the clinic.   BELOW ARE SYMPTOMS THAT SHOULD BE REPORTED IMMEDIATELY:  *FEVER GREATER THAN 100.5 F  *CHILLS WITH OR WITHOUT FEVER  NAUSEA AND VOMITING THAT IS NOT CONTROLLED WITH YOUR NAUSEA MEDICATION  *UNUSUAL SHORTNESS OF BREATH  *UNUSUAL BRUISING OR BLEEDING  TENDERNESS IN MOUTH AND THROAT WITH OR WITHOUT PRESENCE OF ULCERS  *URINARY PROBLEMS  *BOWEL PROBLEMS  UNUSUAL RASH Items with * indicate a potential emergency and should be followed up as soon as possible.  Feel free to call the clinic you have any questions or concerns. The clinic phone number is [phone].

## 2013-12-21 NOTE — Progress Notes (Signed)
[facility] OFFICE PROGRESS NOTE  Patient Care Team: Angelica Ran, MD as PCP - General (Family Medicine) Windy Kalata, MD as Consulting Physician (Nephrology) Leatrice Jewels Rayetta Pigg, MD as Consulting Physician (Psychiatry) Heath Lark, MD as Consulting Physician (Hematology and Oncology)  DIAGNOSIS: Metastatic colon cancer, ongoing treatment  SUMMARY OF ONCOLOGIC HISTORY: Oncology History   Colon cancer, KRAS positive   Primary site: Colon and Rectum (Right)   Staging method: AJCC 7th Edition   Clinical: (T4b, N2b, M1) signed by Heath Lark, MD on 08/05/2013  9:52 AM   Pathologic: Stage IVB (T4b, N2b, M1b) signed by Heath Lark, MD on 08/05/2013 10:21 AM   Summary: Stage IVB (T4b, N2b, M1b)      Colon cancer   07/15/2013 - 07/25/2013 Hospital Admission The patient presented to the hospital with pain in her abdomen and underwent surgery for colon cancer   07/15/2013 Imaging CT scan of the abdomen show bowel dilatation suspicious for malignancy   07/17/2013 Tumor Marker Preoperative CEA was 2.2, normal   07/19/2013 Procedure Colonoscopy revealed ascending colon lesion with significant stricture and biopsy showed adenocarcinoma   07/20/2013 Surgery She underwent right hemicolectomy, cholecystectomy, liver biopsy, excision of abdomen no wall mass and omental mass. During surgery, she was also found to have drop metastasis   08/29/2013 Imaging Repeat CT scan show persistent liver metastasis and new omental metastasis   08/31/2013 -  Chemotherapy The patient was started on palliative chemotherapy with FOLFOX, bolus 5-FU committed and oxaliplatin dose reduced due to  peripheral neuropathy   11/22/2013 Imaging Repeat CT scan showed near complete response to treatment.   11/23/2013 -  Chemotherapy Avastin was added to her chemotherapy regimen   12/21/2013 Adverse Reaction Oxaliplatin was omitted due to worsening peripheral neuropathy.    INTERVAL HISTORY: Monica [name]  53 y.o. female returns for further followup. She complained of worsening peripheral neuropathy. She also complained of some knee pain. The patient denies any mouth sores, nausea, vomiting or change in bowel habits She denies any recent fever, chills, night sweats or abnormal weight loss  I have reviewed the past medical history, past surgical history, social history and family history with the patient and they are unchanged from previous note.  ALLERGIES:  has No Known Allergies.  MEDICATIONS:  Current Outpatient Prescriptions  Medication Sig Dispense Refill  . ACCU-CHEK AVIVA PLUS test strip USE AS DIRECTED  100 each  10  . atorvastatin (LIPITOR) 40 MG tablet Take 1 tablet (40 mg total) by mouth daily.  90 tablet  3  . buprenorphine-naloxone (SUBOXONE) 8-2 MG SUBL SL tablet Place 1 tablet under the tongue 3 (three) times daily.      . clonazePAM (KLONOPIN) 1 MG tablet Take 1 mg by mouth 3 (three) times daily as needed for anxiety.       . diazepam (VALIUM) 10 MG tablet Take 10 mg by mouth 3 (three) times daily.      Marland Kitchen docusate sodium (COLACE) 100 MG capsule TAKE ONE CAPSULE BY MOUTH TWICE A DAY  60 capsule  5  . furosemide (LASIX) 40 MG tablet Take 1 tablet (40 mg total) by mouth 2 (two) times daily.  60 tablet  5  . gabapentin (NEURONTIN) 300 MG capsule Take 2 capsules (600 mg total) by mouth 2 (two) times daily.  120 capsule  5  . HUMALOG 100 UNIT/ML injection INJECT 2-8 UNITS INTO THE SKIN DAILY. PT USES SLIDING SCALE  10 mL  3  .  hydrOXYzine (ATARAX/VISTARIL) 25 MG tablet Take 50 mg by mouth 3 (three) times daily as needed for anxiety.      Marland Kitchen ibuprofen (ADVIL,MOTRIN) 800 MG tablet Take 800 mg by mouth every 8 (eight) hours as needed for fever, headache, mild pain or moderate pain.      Marland Kitchen lamoTRIgine (LAMICTAL) 100 MG tablet Take 100 mg by mouth 2 (two) times daily.      Marland Kitchen LANTUS 100 UNIT/ML injection INJECT 25 UNITS INTO THE SKIN AT BEDTIME.  10 mL  5  . lidocaine-prilocaine (EMLA)  cream Apply 1 application topically as needed (for pain).      Marland Kitchen omeprazole (PRILOSEC) 20 MG capsule Take 20 mg by mouth daily.      . ondansetron (ZOFRAN) 8 MG tablet Take 1 tablet (8 mg total) by mouth every 8 (eight) hours as needed.  30 tablet  1  . OVER THE COUNTER MEDICATION Apply 1 application to eye daily.      . polyethylene glycol powder (GLYCOLAX/MIRALAX) powder USE AS DIRECTED  255 g  3  . Prenatal Vit-Fe Fumarate-FA (PRENATAL MULTIVITAMIN) TABS tablet Take 1 tablet by mouth daily at 12 noon.      . prochlorperazine (COMPAZINE) 10 MG tablet Take 1 tablet (10 mg total) by mouth every 6 (six) hours as needed (Nausea or vomiting).  30 tablet  1  . ramelteon (ROZEREM) 8 MG tablet Take 8 mg by mouth at bedtime.      . ziprasidone (GEODON) 80 MG capsule Take 80 mg by mouth at bedtime.        No current facility-administered medications for this visit.    REVIEW OF SYSTEMS:   Constitutional: Denies fevers, chills or abnormal weight loss Eyes: Denies blurriness of vision Ears, nose, mouth, throat, and face: Denies mucositis or sore throat Respiratory: Denies cough, dyspnea or wheezes Cardiovascular: Denies palpitation, chest discomfort or lower extremity swelling Gastrointestinal:  Denies nausea, heartburn or change in bowel habits Skin: Denies abnormal skin rashes Lymphatics: Denies new lymphadenopathy or easy bruising Behavioral/Psych: Mood is stable, no new changes  All other systems were reviewed with the patient and are negative.  PHYSICAL EXAMINATION: ECOG PERFORMANCE STATUS: 1 - Symptomatic but completely ambulatory  Filed Vitals:   12/21/13 0836  BP: 124/68  Pulse: 68  Temp: 97.2 F (36.2 C)  Resp: 20   Filed Weights   12/21/13 0836  Weight: 173 lb 9.6 oz (78.744 kg)    GENERAL:alert, no distress and comfortable SKIN: skin color, texture, turgor are normal, no rashes or significant lesions EYES: normal, Conjunctiva are pink and non-injected, sclera  clear OROPHARYNX:no exudate, no erythema and lips, buccal mucosa, and tongue normal  NECK: supple, thyroid normal size, non-tender, without nodularity LYMPH:  no palpable lymphadenopathy in the cervical, axillary or inguinal LUNGS: clear to auscultation and percussion with normal breathing effort HEART: regular rate & rhythm and no murmurs and no lower extremity edema ABDOMEN:abdomen soft, non-tender and normal bowel sounds Musculoskeletal:no cyanosis of digits and no clubbing  NEURO: alert & oriented x 3 with fluent speech, no focal motor/sensory deficits  LABORATORY DATA:  I have reviewed the data as listed    Component Value Date/Time   NA 146* 12/07/2013 0834   NA 143 08/12/2013 1005   K 3.8 12/07/2013 0834   K 4.2 08/12/2013 1005   CL 105 08/12/2013 1005   CO2 27 12/07/2013 0834   CO2 27 08/12/2013 1005   GLUCOSE 116 12/07/2013 0834   GLUCOSE 95 08/12/2013  1005   BUN 17.1 12/07/2013 0834   BUN 13 08/12/2013 1005   CREATININE 1.2* 12/07/2013 0834   CREATININE 1.14* 08/12/2013 1005   CREATININE 1.38* 05/26/2013 1703   CALCIUM 9.6 12/07/2013 0834   CALCIUM 8.7 08/12/2013 1005   PROT 7.3 12/07/2013 0834   PROT 6.4 08/12/2013 1005   ALBUMIN 3.7 12/07/2013 0834   ALBUMIN 2.9* 08/12/2013 1005   AST 42* 12/07/2013 0834   AST 22 08/12/2013 1005   ALT 44 12/07/2013 0834   ALT 23 08/12/2013 1005   ALKPHOS 134 12/07/2013 0834   ALKPHOS 199* 08/12/2013 1005   BILITOT 0.25 12/07/2013 0834   BILITOT 0.2* 08/12/2013 1005   GFRNONAA 55* 08/12/2013 1005   GFRNONAA 44* 05/26/2013 1703   GFRAA 63* 08/12/2013 1005   GFRAA 51* 05/26/2013 1703    No results found for this basename: SPEP,  UPEP,   kappa and lambda light chains    Lab Results  Component Value Date   WBC 3.4* 12/21/2013   NEUTROABS 1.4* 12/21/2013   HGB 12.3 12/21/2013   HCT 36.4 12/21/2013   MCV 84.5 12/21/2013   PLT 114* 12/21/2013      Chemistry      Component Value Date/Time   NA 146* 12/07/2013 0834   NA 143 08/12/2013 1005   K 3.8 12/07/2013 0834   K 4.2 08/12/2013  1005   CL 105 08/12/2013 1005   CO2 27 12/07/2013 0834   CO2 27 08/12/2013 1005   BUN 17.1 12/07/2013 0834   BUN 13 08/12/2013 1005   CREATININE 1.2* 12/07/2013 0834   CREATININE 1.14* 08/12/2013 1005   CREATININE 1.38* 05/26/2013 1703      Component Value Date/Time   CALCIUM 9.6 12/07/2013 0834   CALCIUM 8.7 08/12/2013 1005   ALKPHOS 134 12/07/2013 0834   ALKPHOS 199* 08/12/2013 1005   AST 42* 12/07/2013 0834   AST 22 08/12/2013 1005   ALT 44 12/07/2013 0834   ALT 23 08/12/2013 1005   BILITOT 0.25 12/07/2013 0834   BILITOT 0.2* 08/12/2013 1005     ASSESSMENT & PLAN:  #1 metastatic colon cancer She tolerated treatment well apart from worsening peripheral neuropathy. I will omit oxaliplatin from this cycle onwards. She will continue on 5FU, LV and Avastin #2 poorly controlled diabetes She will continue insulin adjustments as directed by her primary care provider #3 chronic kidney disease This is stable and not worse. I encourage increase oral intake #4 tobacco abuse The patient will continue her effort to stop smoking #5 worsening peripheral neuropathy The patient had diabetic neuropathy even before we start her on treatment. I recommend discontinuation of oxaliplatin. #6 mild thrombocytopenia This is likely due to recent treatment. The patient denies recent history of bleeding such as epistaxis, hematuria or hematochezia. She is asymptomatic from the low platelet count. I will observe for now.  she does not require transfusion now. I will continue the chemotherapy at current dose without dosage adjustment.  If the thrombocytopenia gets progressive worse in the future, I might have to delay her treatment or adjust the chemotherapy dose. #7 mild leukopenia This is related to treatment. I recommend observation only  Orders Placed This Encounter  Procedures  . CBC with Differential    Standing Status: Standing     Number of Occurrences: 22     Standing Expiration Date: 12/22/2014  . Comprehensive metabolic  panel    Standing Status: Standing     Number of Occurrences: 22     Standing  Expiration Date: 12/22/2014   All questions were answered. The patient knows to call the clinic with any problems, questions or concerns. No barriers to learning was detected.    Heath Lark, MD 12/21/2013 9:13 AM

## 2013-12-21 NOTE — Progress Notes (Signed)
OK to treat despite ANC 1.4 per [place], RN/ Dr. Alvy Bimler. Oxaliplatin omitted from treatment plan today.

## 2013-12-23 ENCOUNTER — Ambulatory Visit (HOSPITAL_BASED_OUTPATIENT_CLINIC_OR_DEPARTMENT_OTHER): Payer: Medicaid Other

## 2013-12-23 ENCOUNTER — Telehealth: Payer: Self-pay | Admitting: Hematology and Oncology

## 2013-12-23 VITALS — BP 116/90 | HR 58 | Temp 97.2°F

## 2013-12-23 DIAGNOSIS — C182 Malignant neoplasm of ascending colon: Secondary | ICD-10-CM

## 2013-12-23 DIAGNOSIS — C787 Secondary malignant neoplasm of liver and intrahepatic bile duct: Secondary | ICD-10-CM

## 2013-12-23 DIAGNOSIS — C189 Malignant neoplasm of colon, unspecified: Secondary | ICD-10-CM

## 2013-12-23 MED ORDER — SODIUM CHLORIDE 0.9 % IJ SOLN
10.0000 mL | INTRAMUSCULAR | Status: DC | PRN
  Administered 2013-12-23: 10 mL
  Filled 2013-12-23: qty 10

## 2013-12-23 MED ORDER — HEPARIN SOD (PORK) LOCK FLUSH 100 UNIT/ML IV SOLN
500.0000 [IU] | Freq: Once | INTRAVENOUS | Status: AC | PRN
  Administered 2013-12-23: 500 [IU]
  Filled 2013-12-23: qty 5

## 2013-12-23 NOTE — Telephone Encounter (Signed)
s.w. pt and advised on July changed.Marland KitchenMarland Kitchenpt will get new sched today

## 2013-12-23 NOTE — Telephone Encounter (Signed)
pt came in to r/s appt in July to earlier time...gv and printed pt new June and July sched

## 2013-12-28 ENCOUNTER — Other Ambulatory Visit: Payer: Self-pay | Admitting: Family Medicine

## 2014-01-02 ENCOUNTER — Telehealth: Payer: Self-pay | Admitting: *Deleted

## 2014-01-02 NOTE — Telephone Encounter (Signed)
Patient called this am experiencing new pain in "all her joints".  It is an aching pain that she rates a 5/10.  It started "a couple of days ago".   She has tried ibuprofen.  She has increased her gabapentin from 600mg  BID to 800mg  TID.  Neither of these medications gave her any relief.  She denies any fever.  She is eating and drinking ok.  Her last chemo was on 5/20  Cycle 9, Day 1 with avastin, 97fu and leucovorin minus the oxaliplatin, which was omitted due to neuropathy.  She is due for more chemo on 01/04/14.  Will consult with Dr. Alvy Bimler for symtom management.

## 2014-01-04 ENCOUNTER — Ambulatory Visit (HOSPITAL_BASED_OUTPATIENT_CLINIC_OR_DEPARTMENT_OTHER): Payer: Medicaid Other

## 2014-01-04 ENCOUNTER — Telehealth: Payer: Self-pay | Admitting: *Deleted

## 2014-01-04 ENCOUNTER — Other Ambulatory Visit (HOSPITAL_BASED_OUTPATIENT_CLINIC_OR_DEPARTMENT_OTHER): Payer: Medicaid Other

## 2014-01-04 ENCOUNTER — Other Ambulatory Visit: Payer: Self-pay | Admitting: Family Medicine

## 2014-01-04 ENCOUNTER — Other Ambulatory Visit: Payer: Self-pay | Admitting: Hematology and Oncology

## 2014-01-04 ENCOUNTER — Encounter: Payer: Self-pay | Admitting: *Deleted

## 2014-01-04 VITALS — BP 121/76 | HR 70 | Temp 97.2°F | Resp 18

## 2014-01-04 DIAGNOSIS — Z5112 Encounter for antineoplastic immunotherapy: Secondary | ICD-10-CM

## 2014-01-04 DIAGNOSIS — C182 Malignant neoplasm of ascending colon: Secondary | ICD-10-CM

## 2014-01-04 DIAGNOSIS — C189 Malignant neoplasm of colon, unspecified: Secondary | ICD-10-CM

## 2014-01-04 DIAGNOSIS — Z5111 Encounter for antineoplastic chemotherapy: Secondary | ICD-10-CM

## 2014-01-04 DIAGNOSIS — C787 Secondary malignant neoplasm of liver and intrahepatic bile duct: Secondary | ICD-10-CM

## 2014-01-04 DIAGNOSIS — M25559 Pain in unspecified hip: Secondary | ICD-10-CM

## 2014-01-04 LAB — COMPREHENSIVE METABOLIC PANEL (CC13)
ALT: 23 U/L (ref 0–55)
ANION GAP: 16 meq/L — AB (ref 3–11)
AST: 25 U/L (ref 5–34)
Albumin: 3.6 g/dL (ref 3.5–5.0)
Alkaline Phosphatase: 127 U/L (ref 40–150)
BUN: 21.7 mg/dL (ref 7.0–26.0)
CALCIUM: 9 mg/dL (ref 8.4–10.4)
CHLORIDE: 102 meq/L (ref 98–109)
CO2: 23 meq/L (ref 22–29)
CREATININE: 1.4 mg/dL — AB (ref 0.6–1.1)
Glucose: 299 mg/dl — ABNORMAL HIGH (ref 70–140)
Potassium: 4 mEq/L (ref 3.5–5.1)
Sodium: 141 mEq/L (ref 136–145)
Total Bilirubin: 0.22 mg/dL (ref 0.20–1.20)
Total Protein: 7.8 g/dL (ref 6.4–8.3)

## 2014-01-04 LAB — CBC WITH DIFFERENTIAL/PLATELET
BASO%: 1.1 % (ref 0.0–2.0)
Basophils Absolute: 0.1 10*3/uL (ref 0.0–0.1)
EOS%: 1.8 % (ref 0.0–7.0)
Eosinophils Absolute: 0.1 10*3/uL (ref 0.0–0.5)
HEMATOCRIT: 42.8 % (ref 34.8–46.6)
HGB: 14.5 g/dL (ref 11.6–15.9)
LYMPH#: 1.4 10*3/uL (ref 0.9–3.3)
LYMPH%: 27.9 % (ref 14.0–49.7)
MCH: 29.3 pg (ref 25.1–34.0)
MCHC: 33.9 g/dL (ref 31.5–36.0)
MCV: 86.6 fL (ref 79.5–101.0)
MONO#: 0.5 10*3/uL (ref 0.1–0.9)
MONO%: 9.9 % (ref 0.0–14.0)
NEUT%: 59.3 % (ref 38.4–76.8)
NEUTROS ABS: 2.9 10*3/uL (ref 1.5–6.5)
Platelets: 151 10*3/uL (ref 145–400)
RBC: 4.94 10*6/uL (ref 3.70–5.45)
RDW: 17.4 % — ABNORMAL HIGH (ref 11.2–14.5)
WBC: 4.9 10*3/uL (ref 3.9–10.3)

## 2014-01-04 LAB — UA PROTEIN, DIPSTICK - CHCC: PROTEIN: NEGATIVE mg/dL

## 2014-01-04 MED ORDER — DEXAMETHASONE SODIUM PHOSPHATE 10 MG/ML IJ SOLN
10.0000 mg | Freq: Once | INTRAMUSCULAR | Status: AC
  Administered 2014-01-04: 10 mg via INTRAVENOUS

## 2014-01-04 MED ORDER — TRAMADOL HCL 50 MG PO TABS: 100.0000 mg | ORAL_TABLET | Freq: Four times a day (QID) | ORAL | Status: DC | PRN

## 2014-01-04 MED ORDER — ONDANSETRON 8 MG/NS 50 ML IVPB
INTRAVENOUS | Status: AC
  Filled 2014-01-04: qty 8

## 2014-01-04 MED ORDER — SODIUM CHLORIDE 0.9 % IV SOLN
Freq: Once | INTRAVENOUS | Status: AC
  Administered 2014-01-04: 09:00:00 via INTRAVENOUS

## 2014-01-04 MED ORDER — DEXTROSE 5 % IV SOLN
Freq: Once | INTRAVENOUS | Status: AC
  Administered 2014-01-04: 10:00:00 via INTRAVENOUS

## 2014-01-04 MED ORDER — LEUCOVORIN CALCIUM INJECTION 350 MG
405.0000 mg/m2 | Freq: Once | INTRAMUSCULAR | Status: AC
  Administered 2014-01-04: 700 mg via INTRAVENOUS
  Filled 2014-01-04: qty 35

## 2014-01-04 MED ORDER — ONDANSETRON 8 MG/50ML IVPB (CHCC)
8.0000 mg | Freq: Once | INTRAVENOUS | Status: AC
  Administered 2014-01-04: 8 mg via INTRAVENOUS

## 2014-01-04 MED ORDER — FLUOROURACIL CHEMO INJECTION 5 GM/100ML
2400.0000 mg/m2 | INTRAVENOUS | Status: DC
  Administered 2014-01-04: 4150 mg via INTRAVENOUS
  Filled 2014-01-04: qty 83

## 2014-01-04 MED ORDER — BEVACIZUMAB CHEMO INJECTION 400 MG/16ML
5.0000 mg/kg | Freq: Once | INTRAVENOUS | Status: AC
  Administered 2014-01-04: 375 mg via INTRAVENOUS
  Filled 2014-01-04: qty 15

## 2014-01-04 MED ORDER — DEXAMETHASONE SODIUM PHOSPHATE 10 MG/ML IJ SOLN
INTRAMUSCULAR | Status: AC
  Filled 2014-01-04: qty 1

## 2014-01-04 NOTE — Telephone Encounter (Signed)
   Provider input needed: Joint pain, sore throat   Reason for call: Walk in form completed before today's lab and treatment.  Ears, nose, mouth, throat, and face: positive for sore throat Musculoskeletal:positive for stiff joints and and pain   ALLERGIES:  has No Known Allergies.  Patient last received chemotherapy/ treatment on 12-21-2013 avastin, 5FU, Leucovorin.  Oxaliplatin held due to neuropathy  Patient was last seen in the office on 12-21-2013  Next appt is 01-18-2014  Is patient having fevers greater than 100.5?  no, this morning temp = 97.2, denies fever at home.   Is patient having uncontrolled pain, or new pain? yes, pain to joints of fingers, knees and feet started over the weekend.   Is patient having new back pain that changes with position (worsens or eases when laying down?)  yes, reports back pain "when I am in bed that changes depending on movement of how I sit or lie down".   Is patient able to eat and drink? yes    Is patient able to pass stool without difficulty?   yes     Is patient having uncontrolled nausea?  no    patient calls 01/04/2014 with complaint of  Ears, nose, mouth, throat, and face: positive for sore throat and "I know I have a cold.  My dauughter had a cold and I have caught her cold.  I do not have a cough or any other cold symptoms yet.  Musculoskeletal:positive for stiff joints and "this is pain to my joints I've never felt before.  I took ibuprofen 800 mg on Sunday and it didn't help.  I do have neuropathy from diabetes but this hurts and is different."   Summary Based on the above information advised patient to  Repeat ibuprofen as needed.  This nurse will notify Dr. Alvy Bimler.  Patient in the infusion room awaiting treatment.   [name]  01/04/2014, 8:52 AM   Background Info  Monica [name]   DOB: [birth year]   MR#: [id number]   CSN#   [id number] 01/04/2014

## 2014-01-04 NOTE — Progress Notes (Signed)
Patient arrives today to infusion appt with complaints of joint pain in knees, hands, feet that is not relieved by ibuprofen or gabapentin. Pain is 6/10 bilaterally, making walking painful. She also reports a sore throat, beginning today and says "i have a cold." She reports her daughter had a cold with sore throat and "blowing snot."  Patient denies rhinorrhea, coughing, fever, or chills. Dr. Alvy Bimler notified of the above and MD reviewed all labs. OK to proceed with treatment today. Patient notified that her labs do not indicate an infection or require medication at this time for the sore throat. As for pain, Dr. Alvy Bimler wrote rx for tramadol, 100 mg Q6 prn for pain. Rx given to patient with the instruction to speak to her provider at pain clinic prior to filling the tramadol rx. Patient verbalizes understanding.

## 2014-01-04 NOTE — Patient Instructions (Signed)
[facility] Discharge Instructions for Patients Receiving Chemotherapy  Today you received the following chemotherapy agents Avastin, Leucovorin, 5FU  To help prevent nausea and vomiting after your treatment, we encourage you to take your nausea medication as prescribed.   If you develop nausea and vomiting that is not controlled by your nausea medication, call the clinic.   BELOW ARE SYMPTOMS THAT SHOULD BE REPORTED IMMEDIATELY:  *FEVER GREATER THAN 100.5 F  *CHILLS WITH OR WITHOUT FEVER  NAUSEA AND VOMITING THAT IS NOT CONTROLLED WITH YOUR NAUSEA MEDICATION  *UNUSUAL SHORTNESS OF BREATH  *UNUSUAL BRUISING OR BLEEDING  TENDERNESS IN MOUTH AND THROAT WITH OR WITHOUT PRESENCE OF ULCERS  *URINARY PROBLEMS  *BOWEL PROBLEMS  UNUSUAL RASH Items with * indicate a potential emergency and should be followed up as soon as possible.  Feel free to call the clinic you have any questions or concerns. The clinic phone number is [phone].    

## 2014-01-04 NOTE — Progress Notes (Signed)
Patient completed walk in form at 0800 am reporting joint pain and having a cold.  See Basic Triage assessment documentation.  Infusion nurse notifying Dr. Alvy Bimler.

## 2014-01-06 ENCOUNTER — Ambulatory Visit (HOSPITAL_BASED_OUTPATIENT_CLINIC_OR_DEPARTMENT_OTHER): Payer: Medicaid Other

## 2014-01-06 VITALS — BP 117/70 | HR 84

## 2014-01-06 DIAGNOSIS — Z452 Encounter for adjustment and management of vascular access device: Secondary | ICD-10-CM

## 2014-01-06 DIAGNOSIS — C182 Malignant neoplasm of ascending colon: Secondary | ICD-10-CM

## 2014-01-06 DIAGNOSIS — C189 Malignant neoplasm of colon, unspecified: Secondary | ICD-10-CM

## 2014-01-06 MED ORDER — SODIUM CHLORIDE 0.9 % IJ SOLN
10.0000 mL | INTRAMUSCULAR | Status: DC | PRN
  Administered 2014-01-06: 10 mL
  Filled 2014-01-06: qty 10

## 2014-01-06 MED ORDER — HEPARIN SOD (PORK) LOCK FLUSH 100 UNIT/ML IV SOLN
500.0000 [IU] | Freq: Once | INTRAVENOUS | Status: AC | PRN
  Administered 2014-01-06: 500 [IU]
  Filled 2014-01-06: qty 5

## 2014-01-11 ENCOUNTER — Encounter (HOSPITAL_COMMUNITY): Payer: Self-pay | Admitting: Emergency Medicine

## 2014-01-11 ENCOUNTER — Emergency Department (HOSPITAL_COMMUNITY): Payer: Medicaid Other

## 2014-01-11 ENCOUNTER — Inpatient Hospital Stay (HOSPITAL_COMMUNITY)
Admission: EM | Admit: 2014-01-11 | Discharge: 2014-01-13 | DRG: 639 | Disposition: A | Discharge status: Home / Self Care | Payer: Medicaid Other | Attending: Family Medicine | Admitting: Family Medicine

## 2014-01-11 DIAGNOSIS — E162 Hypoglycemia, unspecified: Secondary | ICD-10-CM | POA: Diagnosis present

## 2014-01-11 DIAGNOSIS — I129 Hypertensive chronic kidney disease with stage 1 through stage 4 chronic kidney disease, or unspecified chronic kidney disease: Secondary | ICD-10-CM | POA: Diagnosis present

## 2014-01-11 DIAGNOSIS — F319 Bipolar disorder, unspecified: Secondary | ICD-10-CM | POA: Diagnosis present

## 2014-01-11 DIAGNOSIS — C189 Malignant neoplasm of colon, unspecified: Secondary | ICD-10-CM

## 2014-01-11 DIAGNOSIS — E785 Hyperlipidemia, unspecified: Secondary | ICD-10-CM | POA: Diagnosis present

## 2014-01-11 DIAGNOSIS — Z79899 Other long term (current) drug therapy: Secondary | ICD-10-CM

## 2014-01-11 DIAGNOSIS — N183 Chronic kidney disease, stage 3 unspecified: Secondary | ICD-10-CM | POA: Diagnosis present

## 2014-01-11 DIAGNOSIS — F1410 Cocaine abuse, uncomplicated: Secondary | ICD-10-CM | POA: Diagnosis present

## 2014-01-11 DIAGNOSIS — E1142 Type 2 diabetes mellitus with diabetic polyneuropathy: Secondary | ICD-10-CM | POA: Diagnosis present

## 2014-01-11 DIAGNOSIS — C801 Malignant (primary) neoplasm, unspecified: Secondary | ICD-10-CM | POA: Diagnosis present

## 2014-01-11 DIAGNOSIS — E1049 Type 1 diabetes mellitus with other diabetic neurological complication: Secondary | ICD-10-CM | POA: Diagnosis present

## 2014-01-11 DIAGNOSIS — B1710 Acute hepatitis C without hepatic coma: Secondary | ICD-10-CM

## 2014-01-11 DIAGNOSIS — Z85038 Personal history of other malignant neoplasm of large intestine: Secondary | ICD-10-CM

## 2014-01-11 DIAGNOSIS — E109 Type 1 diabetes mellitus without complications: Secondary | ICD-10-CM

## 2014-01-11 DIAGNOSIS — B1920 Unspecified viral hepatitis C without hepatic coma: Secondary | ICD-10-CM | POA: Diagnosis present

## 2014-01-11 DIAGNOSIS — Z9221 Personal history of antineoplastic chemotherapy: Secondary | ICD-10-CM

## 2014-01-11 DIAGNOSIS — E1069 Type 1 diabetes mellitus with other specified complication: Principal | ICD-10-CM | POA: Diagnosis present

## 2014-01-11 DIAGNOSIS — F17200 Nicotine dependence, unspecified, uncomplicated: Secondary | ICD-10-CM | POA: Diagnosis present

## 2014-01-11 LAB — URINALYSIS, ROUTINE W REFLEX MICROSCOPIC
Bilirubin Urine: NEGATIVE
Glucose, UA: 100 mg/dL — AB
Hgb urine dipstick: NEGATIVE
Ketones, ur: NEGATIVE mg/dL
Leukocytes, UA: NEGATIVE
NITRITE: NEGATIVE
Protein, ur: NEGATIVE mg/dL
SPECIFIC GRAVITY, URINE: 1.011 (ref 1.005–1.030)
UROBILINOGEN UA: 1 mg/dL (ref 0.0–1.0)
pH: 6.5 (ref 5.0–8.0)

## 2014-01-11 LAB — COMPREHENSIVE METABOLIC PANEL
ALT: 28 U/L (ref 0–35)
AST: 31 U/L (ref 0–37)
Albumin: 3.4 g/dL — ABNORMAL LOW (ref 3.5–5.2)
Alkaline Phosphatase: 137 U/L — ABNORMAL HIGH (ref 39–117)
BUN: 12 mg/dL (ref 6–23)
CALCIUM: 9.5 mg/dL (ref 8.4–10.5)
CO2: 31 mEq/L (ref 19–32)
CREATININE: 1.08 mg/dL (ref 0.50–1.10)
Chloride: 100 mEq/L (ref 96–112)
GFR calc non Af Amer: 58 mL/min — ABNORMAL LOW (ref >90)
GFR, EST AFRICAN AMERICAN: 68 mL/min — AB (ref >90)
Glucose, Bld: 81 mg/dL (ref 70–99)
Potassium: 3.5 mEq/L — ABNORMAL LOW (ref 3.7–5.3)
SODIUM: 144 meq/L (ref 137–147)
Total Bilirubin: 0.3 mg/dL (ref 0.3–1.2)
Total Protein: 7.1 g/dL (ref 6.0–8.3)

## 2014-01-11 LAB — CBC
HCT: 35 % — ABNORMAL LOW (ref 36.0–46.0)
HEMOGLOBIN: 11.6 g/dL — AB (ref 12.0–15.0)
MCH: 28.3 pg (ref 26.0–34.0)
MCHC: 33.1 g/dL (ref 30.0–36.0)
MCV: 85.4 fL (ref 78.0–100.0)
PLATELETS: 141 10*3/uL — AB (ref 150–400)
RBC: 4.1 MIL/uL (ref 3.87–5.11)
RDW: 15.7 % — ABNORMAL HIGH (ref 11.5–15.5)
WBC: 5.5 10*3/uL (ref 4.0–10.5)

## 2014-01-11 LAB — CBG MONITORING, ED
GLUCOSE-CAPILLARY: 63 mg/dL — AB (ref 70–99)
GLUCOSE-CAPILLARY: 115 mg/dL — AB (ref 70–99)
GLUCOSE-CAPILLARY: 112 mg/dL — AB (ref 70–99)
Glucose-Capillary: 77 mg/dL (ref 70–99)

## 2014-01-11 MED ORDER — DEXTROSE 5 % IV SOLN
Freq: Once | INTRAVENOUS | Status: AC
  Administered 2014-01-11: 21:00:00 via INTRAVENOUS

## 2014-01-11 MED ORDER — DEXTROSE 50 % IV SOLN
50.0000 mL | Freq: Once | INTRAVENOUS | Status: AC
  Administered 2014-01-11: 50 mL via INTRAVENOUS
  Filled 2014-01-11: qty 50

## 2014-01-11 NOTE — ED Notes (Signed)
Pt arrives by EMS-states originally called out at 1700-IV started and pt given D50 for a blood sugar of 24-pt then declined to go to the ED for further evaluation.  Pt again called EMS at 1845, glucose 34/attempt x 1 for IV unsuccessful and pt given glucagon IM at 1900/oral glucose 25 grams.  Pt's blood sugar was then 67 ~10 minutes ago

## 2014-01-11 NOTE — ED Notes (Signed)
Pt. CBG checked per EDP, [place]. Pt. CBG 63. RN,[place] made aware.

## 2014-01-11 NOTE — ED Provider Notes (Signed)
CSN: [id number]     Arrival date & time 01/11/14  1924 History   First MD Initiated Contact with Patient 01/11/14 1956     Chief Complaint  Patient presents with  . Hypoglycemia     (Consider location/radiation/quality/duration/timing/severity/associated sxs/prior Treatment) HPI 53 year old female presents with multiple episodes of hypoglycemia. She's a type I diabetic and is on Lantus and Humalog. The patient had an episode of hypoglycemia, with her sugar in the 30s at 3 AM. She drank orange juice and had something to eat and her sugar improved and she went back to bed. This afternoon she's had multiple episodes of hypoglycemia. The first time EMS gave her D50. She also had orange juice and a half a doughnut. However less than an hour later they were called back because she's having hypoglycemia again. She was unable to get an IV placed because of agitation and so she was given glucagon IM. She was then brought in to the ER for evaluation. Patient's family relates that she's been having a "cold" over the past several days. Has not had any temperature above 100. She has been having cough and congestion. She's been taking a couple bites of hamburger since she arrived here but her family nurse she's been getting sleepier since being in the ER. Patient is too sleepy to provide history. She awakens to voice but states she thinks her glucose is getting low again and she's very tired from lack of sleep.  Past Medical History  Diagnosis Date  . Diabetes mellitus   . Bipolar disorder   . Depression   . Hepatitis C   . Psychotic episode   . CORNEAL ABRASION, RIGHT 08/20/2010    Qualifier: Diagnosis of  By: Hassell Done FNP, Tori Milks    . Colon cancer 07/22/2013  . Complication of anesthesia     Difficulty putting to sllep- done at [facility]  . Anxiety   . IBS (irritable bowel syndrome)   . PONV (postoperative nausea and vomiting)   . Subcutaneous nodule of anterior left knee 05/01/2013  . Dysmenorrhea 09/05/2013   . Leukopenia due to antineoplastic chemotherapy 09/28/2013  . Thrombocytopenia due to drugs 09/28/2013  . Neuropathy due to secondary diabetes 10/12/2013   Past Surgical History  Procedure Laterality Date  . Cesarean section    . Colonoscopy N/A 07/19/2013    Procedure: COLONOSCOPY;  Surgeon: Beryle Beams, MD;  Location: [place];  Service: Endoscopy;  Laterality: N/A;  . Partial colectomy Right 07/20/2013    Procedure: PARTIAL COLECTOMY;  Surgeon: Zenovia Jarred, MD;  Location: [place];  Service: General;  Laterality: Right;  . Cholecystectomy N/A 07/20/2013    Procedure: CHOLECYSTECTOMY;  Surgeon: Zenovia Jarred, MD;  Location: [place];  Service: General;  Laterality: N/A;  . Liver surgery      small amt os scar tissue removed with colon surgery.  . Portacath placement Left 08/12/2013    Procedure: INSERTION PORT-A-CATH;  Surgeon: Zenovia Jarred, MD;  Location: [place];  Service: General;  Laterality: Left;   No family history on file. History  Substance Use Topics  . Smoking status: Current Every Day Smoker -- 0.50 packs/day for 20 years    Types: Cigarettes  . Smokeless tobacco: Never Used     Comment: smokes 6 cig/daily. uses the patch  . Alcohol Use: No   OB History   Grav Para Term Preterm Abortions TAB SAB Ect Mult Living  Review of Systems  Constitutional: Negative for fever.  HENT: Positive for congestion.   Respiratory: Positive for cough. Negative for shortness of breath.   Cardiovascular: Negative for chest pain.  Gastrointestinal: Negative for vomiting.  Genitourinary: Negative for dysuria.  Psychiatric/Behavioral: Positive for confusion.  All other systems reviewed and are negative.     Allergies  Review of patient's allergies indicates no known allergies.  Home Medications   Prior to Admission medications   Medication Sig Start Date End Date Taking? Authorizing Provider  ACCU-CHEK AVIVA PLUS test strip USE AS DIRECTED 07/08/13    Angelica Ran, MD  atorvastatin (LIPITOR) 40 MG tablet TAKE 1 TABLET BY MOUTH EVERY DAY 12/28/13   Angelica Ran, MD  buprenorphine-naloxone (SUBOXONE) 8-2 MG SUBL SL tablet Place 1 tablet under the tongue 3 (three) times daily.    Dewain Penning, MD  clonazePAM (KLONOPIN) 1 MG tablet Take 1 mg by mouth 3 (three) times daily as needed for anxiety.     Historical Provider, MD  diazepam (VALIUM) 10 MG tablet Take 10 mg by mouth 3 (three) times daily.    Dewain Penning, MD  docusate sodium (COLACE) 100 MG capsule TAKE ONE CAPSULE BY MOUTH TWICE A DAY 08/23/13   Angelica Ran, MD  furosemide (LASIX) 40 MG tablet Take 1 tablet (40 mg total) by mouth 2 (two) times daily. 09/28/12   Angelica Ran, MD  gabapentin (NEURONTIN) 300 MG capsule Take 2 capsules (600 mg total) by mouth 2 (two) times daily. 09/13/13   Angelica Ran, MD  HUMALOG 100 UNIT/ML injection INJECT 2-8 UNITS INTO THE SKIN DAILY. PT USES SLIDING SCALE 08/24/13   Angelica Ran, MD  hydrOXYzine (ATARAX/VISTARIL) 25 MG tablet Take 50 mg by mouth 3 (three) times daily as needed for anxiety.    Historical Provider, MD  ibuprofen (ADVIL,MOTRIN) 800 MG tablet Take 800 mg by mouth every 8 (eight) hours as needed for fever, headache, mild pain or moderate pain. 12/29/12   Montine Circle, PA-C  lamoTRIgine (LAMICTAL) 100 MG tablet Take 100 mg by mouth 2 (two) times daily.    Historical Provider, MD  LANTUS 100 UNIT/ML injection INJECT 25 UNITS INTO THE SKIN AT BEDTIME.    Angelica Ran, MD  lidocaine-prilocaine (EMLA) cream Apply 1 application topically as needed (for pain). 08/05/13   Heath Lark, MD  omeprazole (PRILOSEC) 20 MG capsule TAKE ONE CAPSULE BY MOUTH EVERY MORNING    Angelica Ran, MD  ondansetron (ZOFRAN) 8 MG tablet Take 1 tablet (8 mg total) by mouth every 8 (eight) hours as needed. 08/05/13   Heath Lark, MD  OVER THE COUNTER MEDICATION Apply 1 application to eye daily.    Historical Provider, MD   polyethylene glycol powder (GLYCOLAX/MIRALAX) powder USE AS DIRECTED    Angelica Ran, MD  Prenatal Vit-Fe Fumarate-FA (PRENATAL MULTIVITAMIN) TABS tablet Take 1 tablet by mouth daily at 12 noon.    Historical Provider, MD  prochlorperazine (COMPAZINE) 10 MG tablet Take 1 tablet (10 mg total) by mouth every 6 (six) hours as needed (Nausea or vomiting). 08/05/13   Heath Lark, MD  ramelteon (ROZEREM) 8 MG tablet Take 8 mg by mouth at bedtime.    Historical Provider, MD  traMADol (ULTRAM) 50 MG tablet Take 2 tablets (100 mg total) by mouth every 6 (six) hours as needed. 01/04/14   Heath Lark, MD  ziprasidone (GEODON) 80 MG capsule Take 80 mg by mouth at bedtime.  Historical Provider, MD   BP 132/69  Temp(Src) 98.2 F (36.8 C) (Oral)  Resp 8  Ht 5\' 5"  (1.651 m)  Wt 165 lb (74.844 kg)  BMI 27.46 kg/m2  SpO2 90% Physical Exam  Nursing note and vitals reviewed. Constitutional: She is oriented to person, place, and time. She appears well-developed and well-nourished. She appears lethargic.  HENT:  Head: Normocephalic and atraumatic.  Right Ear: External ear normal.  Left Ear: External ear normal.  Nose: Nose normal.  Eyes: Right eye exhibits no discharge. Left eye exhibits no discharge.  Cardiovascular: Normal rate, regular rhythm and normal heart sounds.   Pulmonary/Chest: Effort normal and breath sounds normal.  Abdominal: Soft. There is no tenderness.  Neurological: She is oriented to person, place, and time. She appears lethargic. GCS eye subscore is 3. GCS verbal subscore is 5. GCS motor subscore is 6.  Skin: Skin is warm and dry.    ED Course  Procedures (including critical care time) Labs Review Labs Reviewed  CBC - Abnormal; Notable for the following:    Hemoglobin 11.6 (*)    HCT 35.0 (*)    RDW 15.7 (*)    Platelets 141 (*)    All other components within normal limits  COMPREHENSIVE METABOLIC PANEL - Abnormal; Notable for the following:    Potassium 3.5 (*)     Albumin 3.4 (*)    Alkaline Phosphatase 137 (*)    GFR calc non Af Amer 58 (*)    GFR calc Af Amer 68 (*)    All other components within normal limits  URINALYSIS, ROUTINE W REFLEX MICROSCOPIC - Abnormal; Notable for the following:    Glucose, UA 100 (*)    All other components within normal limits  CBG MONITORING, ED - Abnormal; Notable for the following:    Glucose-Capillary 63 (*)    All other components within normal limits  CBG MONITORING, ED - Abnormal; Notable for the following:    Glucose-Capillary 115 (*)    All other components within normal limits  CBG MONITORING, ED - Abnormal; Notable for the following:    Glucose-Capillary 112 (*)    All other components within normal limits  CBG MONITORING, ED    Imaging Review Dg Chest Portable 1 View  01/11/2014   CLINICAL DATA:  Cough  EXAM: PORTABLE CHEST - 1 VIEW  COMPARISON:  Chest CT November 22, 2013 and chest radiograph August 12, 2013  FINDINGS: There is mild bibasilar atelectatic change. There is no frank edema or consolidation. Heart size and pulmonary vascularity are normal. Port-A-Cath tip is in the superior vena cava. No pneumothorax. No adenopathy.  IMPRESSION: Mild bibasilar atelectatic change.  No edema or consolidation.   Electronically Signed   By: Lowella Grip M.D.   On: 01/11/2014 21:02     EKG Interpretation None      CRITICAL CARE Performed by: Sherwood Gambler T   Total critical care time: 30 minutes  Critical care time was exclusive of separately billable procedures and treating other patients.  Critical care was necessary to treat or prevent imminent or life-threatening deterioration.  Critical care was time spent personally by me on the following activities: development of treatment plan with patient and/or surrogate as well as nursing, discussions with consultants, evaluation of patient's response to treatment, examination of patient, obtaining history from patient or surrogate, ordering and  performing treatments and interventions, ordering and review of laboratory studies, ordering and review of radiographic studies, pulse oximetry and re-evaluation of patient's  condition.  MDM   Final diagnoses:  Hypoglycemia    Patient with recurrent hypoglycemia. She has very temporary normalization of her glucose after glucose given by EMS multiple times. However she is still dropping here despite eating a hamburger while in the ED. It is unclear of her Lantus is playing a role given that she has poor by mouth intake per the family. No signs of any bacterial infection causing this. She is having a cold but otherwise no other symptoms. She relates that she is intermittently on steroids, most recently one week ago. She gets it every other week for her chemotherapy. She was stabilized on a glucose drip after D50 in the ER. Patient will be admitted to family practice at [facility].    Ephraim Hamburger, MD 01/11/14 [phone]

## 2014-01-11 NOTE — H&P (Signed)
[facility] Admission History and Physical Service Pager: [phone]  Patient name: Monica [name] Medical record number: [id number] Date of birth: 1960-09-19 Age: 53 y.o. Gender: female  Primary Care Provider: Dewain Penning, MD Consultants: None Code Status: Full code, no futile procedures  Chief Complaint: Recurrent hypoglycemia  Assessment and Plan: Monica [name] is a 53 y.o. female presenting with frequent hypoglycemia . PMH is significant for Type 1 DM, Hepatitis C, colon cancer, hyperlipidemia and hypertension  # Recurrent hypoglycemia: patient with type 1 diabetes currently on Novolog and Lantus. States she took her usual dose of Lantus 25 units today. Patient reportedly does not eat a consistent diet with high carb snacks. Possible patient took more insulin than realized. Insulinoma also in differential, however would not suspect this late into patient's life. Adrenal insufficiency in differential but I don't believe patient is receiving steroids frequent enough.   Admit to telemetry, attending physician Dr. Andria Frames  D5 1/2NS @ 118ml/hr  CBGs q1hr with hypoglycemic protocol if patient becomes hypoglycemic  Low threshold for transfer to stepdown  Neuro checks q2  Given good CBGs overnight, D5 1/2NS turned down to 75cc/hr; if CBGs remain stable, would discontinue the D5  UDS positive for cocaine, this could be contributing to her hypoglycemia as well  # Lethargy: Probably related to polypharmacy as patient is on many different sedating medications. Could also be coming down from cocaine with positive UDS.  Follow-up mental status exam  NPO until mental status improves  # Diabetes mellitus, type 1: last Hgb A1C of 6.9  Hold insulin  CBGs q1hr  hgcb a1c  # Substance abuse/polypharmacy: history of heroin use in past per chart. Patient admits to being a user of cocaine with last use the day before admission.  Follow-up  UDS  Follow-up EtOH  Will hold most home medications since most are sedating  Continue gabapentin in AM since it is sedating  Continue Tramadol PRN  # CKD 3: baseline creatinine of 1.2-1.4.  Creatinine on admission of 1.08.  Follow-up bmet in AM  # Bipolar disorder: No active issues.  Continue lamotrigine and ziprasidone (although is sedating)  # Colon cancer: metastatic; followed by Dr. Alvy Bimler; currently undergoing chemotherapy.  FEN/GI: NPO until patient less somnolent, afterwards carb modified diet, D5 1/2NS @150ml /hr Prophylaxis: Protonix, heparin subq  Disposition: Admit to telemetry  History of Present Illness: Monica [name] is a 53 y.o. female presenting with frequent hypoglycemia. Patient lethargic at time of encounter and was not always cooperative with providing a history. Patient states this morning she took Lantus 25 units, initially with no problems. In the afternoon, she reports falling to the ground. She remained conscious the entire time. Her mom ended up calling EMS who found her to be hypoglycemic. She was given D50, orange juice and a donut but had another episode and EMS gave her glucagon and took her to the [facility] ED. She reports no nausea, no vomiting, no loss of consciousness. She reports a similar event occurred a few days ago at home but she did not seek medical attention. No chest pain, shortness of breath, abdominal pain, diarrhea or dysuria.  She is an active cocaine user with last use the day prior to admission.  Patient received two D50 boluses and continue D5 drip with blood sugars frequently dipping below 70.  Review Of Systems: Per HPI with the following additions: None Otherwise 12 point review of systems was performed and was unremarkable.  Patient Active Problem  List   Diagnosis Date Noted  . Hypoglycemia 01/11/2014  . Neuropathy due to secondary diabetes 10/12/2013  . Leukopenia due to antineoplastic chemotherapy 09/28/2013  .  Thrombocytopenia due to drugs 09/28/2013  . Dysmenorrhea 09/05/2013  . Colon cancer 07/22/2013  . Colonic mass 07/16/2013  . Constipation 05/26/2013  . Severe diabetic hypoglycemia 12/17/2012  . Polypharmacy 12/17/2012  . 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 Bilateral knee pain 09/09/2012  . Opioid dependence 09/21/2011  . Benzodiazepine abuse 09/21/2011  . PERIMENOPAUSAL STATUS 10/11/2010  . HYPERLIPIDEMIA 08/20/2010  . LYMPHADENOPATHY 08/20/2010  . Overweight (BMI 25.0-29.9) 06/22/2009  . CKD (chronic kidney disease), stage III 03/30/2009  . Edema 08/15/2008  . DIABETES MELLITUS, TYPE I, ADULT ONSET 04/07/2008  . 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 HEPATITIS C 04/04/2008  . ANEMIA, CHRONIC 04/04/2008  . BIPOLAR AFFECTIVE DISORDER 04/04/2008  . TOBACCO ABUSE 04/04/2008  . SUBSTANCE ABUSE, MULTIPLE 04/04/2008  . HYPERTENSION, BENIGN ESSENTIAL 04/04/2008   Past Medical History: Past Medical History  Diagnosis Date  . Diabetes mellitus   . Bipolar disorder   . Depression   . Hepatitis C   . Psychotic episode   . CORNEAL ABRASION, RIGHT 08/20/2010    Qualifier: Diagnosis of  By: Hassell Done FNP, Tori Milks    . Colon cancer 07/22/2013  . Complication of anesthesia     Difficulty putting to sllep- done at [facility]  . Anxiety   . IBS (irritable bowel syndrome)   . PONV (postoperative nausea and vomiting)   . Subcutaneous nodule of anterior left knee 05/01/2013  . Dysmenorrhea 09/05/2013  . Leukopenia due to antineoplastic chemotherapy 09/28/2013  . Thrombocytopenia due to drugs 09/28/2013  . Neuropathy due to secondary diabetes 10/12/2013   Past Surgical History: Past Surgical History  Procedure Laterality Date  . Cesarean section    . Colonoscopy N/A 07/19/2013    Procedure: COLONOSCOPY;  Surgeon: Beryle Beams, MD;  Location: [place];  Service: Endoscopy;  Laterality: N/A;  . Partial colectomy Right 07/20/2013    Procedure: PARTIAL COLECTOMY;  Surgeon: Zenovia Jarred, MD;  Location: [place];   Service: General;  Laterality: Right;  . Cholecystectomy N/A 07/20/2013    Procedure: CHOLECYSTECTOMY;  Surgeon: Zenovia Jarred, MD;  Location: [place];  Service: General;  Laterality: N/A;  . Liver surgery      small amt os scar tissue removed with colon surgery.  . Portacath placement Left 08/12/2013    Procedure: INSERTION PORT-A-CATH;  Surgeon: Zenovia Jarred, MD;  Location: [facility];  Service: General;  Laterality: Left;   Social History: History  Substance Use Topics  . Smoking status: Current Every Day Smoker -- 0.50 packs/day for 20 years    Types: Cigarettes  . Smokeless tobacco: Never Used     Comment: smokes 6 cig/daily. uses the patch  . Alcohol Use: No   Additional social history: Uses Cocaine, last used yesterday per patient  Please also refer to relevant sections of EMR.  Family History: No family history on file. Allergies and Medications: No Known Allergies No current facility-administered medications on file prior to encounter.   Current Outpatient Prescriptions on File Prior to Encounter  Medication Sig Dispense Refill  . buprenorphine-naloxone (SUBOXONE) 8-2 MG SUBL SL tablet Place 1 tablet under the tongue 3 (three) times daily.      . clonazePAM (KLONOPIN) 1 MG tablet Take 1 mg by mouth 3 (three) times daily as needed for anxiety.       . diazepam (VALIUM) 10 MG  tablet Take 10 mg by mouth 3 (three) times daily.      . furosemide (LASIX) 40 MG tablet Take 1 tablet (40 mg total) by mouth 2 (two) times daily.  60 tablet  5  . gabapentin (NEURONTIN) 300 MG capsule Take 2 capsules (600 mg total) by mouth 2 (two) times daily.  120 capsule  5  . hydrOXYzine (ATARAX/VISTARIL) 25 MG tablet Take 50 mg by mouth 3 (three) times daily as needed for anxiety.      Marland Kitchen ibuprofen (ADVIL,MOTRIN) 800 MG tablet Take 800 mg by mouth every 8 (eight) hours as needed for fever, headache, mild pain or moderate pain.      Marland Kitchen lamoTRIgine (LAMICTAL) 100 MG tablet Take 200 mg by mouth every  morning.       . lidocaine-prilocaine (EMLA) cream Apply 1 application topically as needed (for pain).      . ondansetron (ZOFRAN) 8 MG tablet Take 1 tablet (8 mg total) by mouth every 8 (eight) hours as needed.  30 tablet  1  . ramelteon (ROZEREM) 8 MG tablet Take 8 mg by mouth at bedtime.      . ziprasidone (GEODON) 80 MG capsule Take 160 mg by mouth at bedtime.       Marland Kitchen ACCU-CHEK AVIVA PLUS test strip USE AS DIRECTED  100 each  10  . prochlorperazine (COMPAZINE) 10 MG tablet Take 1 tablet (10 mg total) by mouth every 6 (six) hours as needed (Nausea or vomiting).  30 tablet  1  . traMADol (ULTRAM) 50 MG tablet Take 2 tablets (100 mg total) by mouth every 6 (six) hours as needed.  60 tablet  0    Objective: BP 111/76  Pulse 63  Temp(Src) 98.2 F (36.8 C) (Oral)  Resp 13  Ht 5\' 5"  (1.651 m)  Wt 165 lb (74.844 kg)  BMI 27.46 kg/m2  SpO2 93%  Exam: General: Laying in bed, lethargic but responds to voice commands, not cooperative, with history or exam HEENT: Moist mucous membranes Cardiovascular: Regular rate/rhythm Respiratory: Clear to auscultation bilaterally, no wheezing; scattered wheezes Abdomen: Soft, non-tender, non-distended Extremities: Moves spontaneously Skin: Warm, well perfused Neuro: Oriented x3  Labs and Imaging: CBC BMET   Recent Labs Lab 01/11/14 2005  WBC 5.5  HGB 11.6*  HCT 35.0*  PLT 141*    Recent Labs Lab 01/11/14 2005  NA 144  K 3.5*  CL 100  CO2 31  BUN 12  CREATININE 1.08  GLUCOSE 81  CALCIUM 9.5     Urinalysis    Component Value Date/Time   COLORURINE YELLOW 01/11/2014 2131   APPEARANCEUR CLEAR 01/11/2014 2131   LABSPEC 1.011 01/11/2014 2131   [place] 6.5 01/11/2014 2131   GLUCOSEU 100* 01/11/2014 2131   HGBUR NEGATIVE 01/11/2014 2131   HGBUR negative 06/20/2010 1107   [place] 01/11/2014 2131   [place] 01/11/2014 2131   PROTEINUR NEGATIVE 01/11/2014 2131   UROBILINOGEN 1.0 01/11/2014 2131   NITRITE NEGATIVE  01/11/2014 2131   LEUKOCYTESUR NEGATIVE 01/11/2014 2131      Cordelia Poche, MD 01/11/2014, 11:23 PM PGY-1, [place] Intern pager: [phone], text pages welcome  PGY-3 Addendum I have seen and examined this patient.  I agree with the above note with my edits in pink.  Valeen Borys 01/12/2014, 6:49 AM

## 2014-01-11 NOTE — ED Notes (Addendum)
Pt refusing monitor, and bed pan to use bathroom. Pt sts she needs to go but has been informed by RN that she cannot walk to the bathroom. Pt refuses bedpan and female urinal. Pt O2 sats continue to drop into the upper 80s and pt. Refuses nasal cannula.

## 2014-01-11 NOTE — ED Notes (Addendum)
Pt. Refused being put on the monitor. Pt. Eating big hamburger. RN,[place] made aware.

## 2014-01-12 ENCOUNTER — Encounter (HOSPITAL_COMMUNITY): Payer: Self-pay | Admitting: General Practice

## 2014-01-12 DIAGNOSIS — C189 Malignant neoplasm of colon, unspecified: Secondary | ICD-10-CM

## 2014-01-12 LAB — BASIC METABOLIC PANEL
BUN: 12 mg/dL (ref 6–23)
CALCIUM: 8.9 mg/dL (ref 8.4–10.5)
CO2: 27 mEq/L (ref 19–32)
Chloride: 97 mEq/L (ref 96–112)
Creatinine, Ser: 1.1 mg/dL (ref 0.50–1.10)
GFR calc non Af Amer: 57 mL/min — ABNORMAL LOW (ref >90)
GFR, EST AFRICAN AMERICAN: 66 mL/min — AB (ref >90)
GLUCOSE: 173 mg/dL — AB (ref 70–99)
POTASSIUM: 3.8 meq/L (ref 3.7–5.3)
SODIUM: 137 meq/L (ref 137–147)

## 2014-01-12 LAB — GLUCOSE, CAPILLARY
GLUCOSE-CAPILLARY: 116 mg/dL — AB (ref 70–99)
GLUCOSE-CAPILLARY: 176 mg/dL — AB (ref 70–99)
GLUCOSE-CAPILLARY: 150 mg/dL — AB (ref 70–99)
Glucose-Capillary: 165 mg/dL — ABNORMAL HIGH (ref 70–99)
Glucose-Capillary: 176 mg/dL — ABNORMAL HIGH (ref 70–99)
Glucose-Capillary: 178 mg/dL — ABNORMAL HIGH (ref 70–99)
Glucose-Capillary: 138 mg/dL — ABNORMAL HIGH (ref 70–99)
Glucose-Capillary: 135 mg/dL — ABNORMAL HIGH (ref 70–99)
Glucose-Capillary: 167 mg/dL — ABNORMAL HIGH (ref 70–99)

## 2014-01-12 LAB — HEMOGLOBIN A1C
HEMOGLOBIN A1C: 6.5 % — AB (ref <5.7)
MEAN PLASMA GLUCOSE: 140 mg/dL — AB (ref <117)

## 2014-01-12 LAB — CBC
HCT: 34 % — ABNORMAL LOW (ref 36.0–46.0)
HEMOGLOBIN: 11.8 g/dL — AB (ref 12.0–15.0)
MCH: 29.4 pg (ref 26.0–34.0)
MCHC: 34.7 g/dL (ref 30.0–36.0)
MCV: 84.8 fL (ref 78.0–100.0)
PLATELETS: 126 10*3/uL — AB (ref 150–400)
RBC: 4.01 MIL/uL (ref 3.87–5.11)
RDW: 16 % — ABNORMAL HIGH (ref 11.5–15.5)
WBC: 4.9 10*3/uL (ref 4.0–10.5)

## 2014-01-12 LAB — CBG MONITORING, ED
Glucose-Capillary: 137 mg/dL — ABNORMAL HIGH (ref 70–99)
Glucose-Capillary: 149 mg/dL — ABNORMAL HIGH (ref 70–99)
Glucose-Capillary: 69 mg/dL — ABNORMAL LOW (ref 70–99)

## 2014-01-12 LAB — RAPID URINE DRUG SCREEN, HOSP PERFORMED
Amphetamines: NOT DETECTED
BENZODIAZEPINES: POSITIVE — AB
Barbiturates: NOT DETECTED
Cocaine: POSITIVE — AB
OPIATES: NOT DETECTED
TETRAHYDROCANNABINOL: NOT DETECTED

## 2014-01-12 LAB — ETHANOL: Alcohol, Ethyl (B): <11 mg/dL (ref 0–11)

## 2014-01-12 MED ORDER — TRAMADOL HCL 50 MG PO TABS: 100.0000 mg | ORAL_TABLET | Freq: Four times a day (QID) | ORAL | Status: DC | PRN

## 2014-01-12 MED ORDER — ZOLPIDEM TARTRATE 5 MG PO TABS: 5.0000 mg | ORAL_TABLET | Freq: Every evening | ORAL | Status: DC | PRN

## 2014-01-12 MED ORDER — INSULIN ASPART 100 UNIT/ML ~~LOC~~ SOLN: 0.0000 [IU] | Freq: Every day | SUBCUTANEOUS | Status: DC

## 2014-01-12 MED ORDER — DEXTROSE 50 % IV SOLN
50.0000 mL | Freq: Once | INTRAVENOUS | Status: AC
  Administered 2014-01-12: 50 mL via INTRAVENOUS
  Filled 2014-01-12: qty 50

## 2014-01-12 MED ORDER — HEPARIN SODIUM (PORCINE) 5000 UNIT/ML IJ SOLN
5000.0000 [IU] | Freq: Three times a day (TID) | INTRAMUSCULAR | Status: DC
  Administered 2014-01-12 – 2014-01-13 (×3): 5000 [IU] via SUBCUTANEOUS
  Filled 2014-01-12 (×7): qty 1

## 2014-01-12 MED ORDER — DEXTROSE-NACL 5-0.45 % IV SOLN
INTRAVENOUS | Status: DC
  Administered 2014-01-12: 03:00:00 via INTRAVENOUS

## 2014-01-12 MED ORDER — LAMOTRIGINE 200 MG PO TABS
200.0000 mg | ORAL_TABLET | Freq: Every day | ORAL | Status: DC
  Administered 2014-01-12 – 2014-01-13 (×2): 200 mg via ORAL
  Filled 2014-01-12 (×2): qty 1

## 2014-01-12 MED ORDER — PANTOPRAZOLE SODIUM 40 MG PO TBEC
40.0000 mg | DELAYED_RELEASE_TABLET | Freq: Every day | ORAL | Status: DC
  Administered 2014-01-12 – 2014-01-13 (×2): 40 mg via ORAL
  Filled 2014-01-12 (×2): qty 1

## 2014-01-12 MED ORDER — ATORVASTATIN CALCIUM 40 MG PO TABS
40.0000 mg | ORAL_TABLET | Freq: Every day | ORAL | Status: DC
  Administered 2014-01-12 – 2014-01-13 (×2): 40 mg via ORAL
  Filled 2014-01-12 (×2): qty 1

## 2014-01-12 MED ORDER — ZIPRASIDONE HCL 80 MG PO CAPS
160.0000 mg | ORAL_CAPSULE | Freq: Every day | ORAL | Status: DC
  Administered 2014-01-12: 160 mg via ORAL
  Filled 2014-01-12 (×2): qty 2

## 2014-01-12 MED ORDER — INSULIN GLARGINE 100 UNIT/ML ~~LOC~~ SOLN
10.0000 [IU] | Freq: Every day | SUBCUTANEOUS | Status: DC
  Administered 2014-01-12 – 2014-01-13 (×2): 10 [IU] via SUBCUTANEOUS
  Filled 2014-01-12 (×2): qty 0.1

## 2014-01-12 MED ORDER — GABAPENTIN 300 MG PO CAPS
600.0000 mg | ORAL_CAPSULE | Freq: Two times a day (BID) | ORAL | Status: DC
  Filled 2014-01-12 (×2): qty 2

## 2014-01-12 MED ORDER — SODIUM CHLORIDE 0.9 % IJ SOLN
3.0000 mL | Freq: Two times a day (BID) | INTRAMUSCULAR | Status: DC
  Administered 2014-01-12: 3 mL via INTRAVENOUS

## 2014-01-12 MED ORDER — INSULIN ASPART 100 UNIT/ML ~~LOC~~ SOLN
0.0000 [IU] | Freq: Three times a day (TID) | SUBCUTANEOUS | Status: DC
  Administered 2014-01-12 (×2): 2 [IU] via SUBCUTANEOUS
  Administered 2014-01-13: 3 [IU] via SUBCUTANEOUS
  Administered 2014-01-13: 5 [IU] via SUBCUTANEOUS

## 2014-01-12 MED ORDER — FUROSEMIDE 20 MG PO TABS
20.0000 mg | ORAL_TABLET | Freq: Two times a day (BID) | ORAL | Status: DC
  Administered 2014-01-12 – 2014-01-13 (×3): 20 mg via ORAL
  Filled 2014-01-12 (×5): qty 1

## 2014-01-12 MED ORDER — GABAPENTIN 600 MG PO TABS
600.0000 mg | ORAL_TABLET | Freq: Two times a day (BID) | ORAL | Status: DC
  Administered 2014-01-12 – 2014-01-13 (×3): 600 mg via ORAL
  Filled 2014-01-12 (×4): qty 1

## 2014-01-12 MED ORDER — GUAIFENESIN 100 MG/5ML PO SYRP
200.0000 mg | ORAL_SOLUTION | ORAL | Status: DC | PRN
  Administered 2014-01-12: 200 mg via ORAL
  Filled 2014-01-12 (×2): qty 10

## 2014-01-12 MED ORDER — POLYETHYLENE GLYCOL 3350 17 G PO PACK
17.0000 g | PACK | Freq: Every day | ORAL | Status: DC | PRN
  Filled 2014-01-12: qty 1

## 2014-01-12 NOTE — ED Notes (Signed)
Attempted to call report and RN unavailable. Floor RN will call back.

## 2014-01-12 NOTE — H&P (Signed)
Seen and examined.  Discussed with Dr. Bridgett Larsson.  Agree with admit and her documentation and management.  Briefly, 53 yo female with refractory hypoglycemia.  Patient now states she inadvertantly took too much insulin - although I am not sure we can fully rely on that history.  Cocaine abuse also played a major role. 1. DM Type 1 with hypoglycemia.  I do not see a compelling reason to change her outpatient regimen.  Whether the hypoglycemia was a dosing mistake or cocaine induced, neither would warrant a decrease in her insulin. 2. Somnolence.  Presumed secondary to hypoglycemia. 3. Cocaine abuse.  She is a binge user.  She tells me this morning that this hospitalization is a wakeup call and that she intends to quit using.  I hope that will turn out to be the truth.

## 2014-01-12 NOTE — ED Notes (Signed)
[facility] Called 

## 2014-01-12 NOTE — ED Notes (Signed)
CBG 69. MD made aware, Pt given 1 amp d50 and rate changed to 150 mL/hr on d5 drip.

## 2014-01-12 NOTE — Progress Notes (Signed)
Pt is refusing telemetry as well as using bed pan or BSC even though very lethargic. Pt unable to complete sentence without falling asleep. Will continue to educate fall safety and monitor pt.   Prescilla Sours, Therapist, sports

## 2014-01-12 NOTE — ED Notes (Signed)
Attempted to call report to RN, RN is going to check with charge RN to see if pt. Is appropriate for that floor. Floor RN to call back.

## 2014-01-12 NOTE — ED Notes (Signed)
Pt. Oxygen level decreased to 89%. Pt. Refused to be put on oxygen. RN,[place] made aware.

## 2014-01-12 NOTE — Progress Notes (Signed)
Family Medicine Teaching Service Daily Progress Note Intern Pager: [phone]  Patient name: Monica [name] Medical record number: [id number] Date of birth: 06-Aug-1960 Age: 53 y.o. Gender: female  Primary Care Provider: Dewain Penning, MD Consultants: None Code Status: Full code; no futile procedures  Pt Overview and Major Events to Date:  6/10: patient admitted  Assessment and Plan: Monica [name] is a 53 y.o. female presenting with frequent hypoglycemia . PMH is significant for Type 1 DM, Hepatitis C, colon cancer, hyperlipidemia and hypertension   # Recurrent hypoglycemia: CBG values have stabilized on D5 fluids. Rate decreased. Unknown what really cause patient to have these episodes since she has been a poor historian.  D5 1/2NS @ 38ml/hr; discontinue when diet order placed CBGs q2hr with hypoglycemic protocol if patient becomes hypoglycemic  Sensitive sliding scale insulin Neuro checks q4  # Lethargy: Probably related to polypharmacy as patient is on many different sedating medications. Could also be coming down from cocaine with positive UDS.  Follow-up mental status exam  NPO until mental status improves Continue to hold most of her sedating medications  # Diabetes mellitus, type 1: last Hgb A1C of 6.9. 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 Patient admits to being a user of cocaine with last use the day before admission.  Will hold most home medications since most are sedating as stated in above problem Continue gabapentin in AM Continue Tramadol PRN  # CKD 3: baseline creatinine of 1.2-1.4. Creatinine on admission of 1.08. Currently 1.10 Follow-up bmet in AM  # Bipolar disorder: No active issues.  Continue lamotrigine and ziprasidone (although is sedating)  # Colon cancer: metastatic; followed by Dr. Alvy Bimler; currently undergoing  chemotherapy.   FEN/GI: NPO until patient less somnolent, afterwards carb modified diet, D5 1/2NS @75ml /hr  Prophylaxis: Protonix, heparin subq  Disposition: Discharge home pending clinical improvement  Subjective:  Patient in bed, no complaints of chest pain, shortness of breath or nausea. Is still feeling tired. Has some knee pain, which is chronic.  Objective: Temp:  [98.2 F (36.8 C)-98.5 F (36.9 C)] 98.5 F (36.9 C) (06/11 0755) Pulse Rate:  [56-66] 58 (06/11 0755) Resp:  [8-18] 15 (06/11 0755) BP: (105-132)/(60-76) 119/66 mmHg (06/11 0755) SpO2:  [90 %-95 %] 90 % (06/11 0755) Weight:  [165 lb (74.844 kg)-167 lb (75.751 kg)] 167 lb (75.751 kg) (06/11 0239)  Physical Exam: General: Laying in bed, sleeping but responsive to voice and commands Cardiovascular: Regular rate/rhythm, no murmur Respiratory: Clear to auscultation bilaterally, no wheezing Abdomen: Soft, non-tender, non-distended Extremities: No calf tenderness no swelling Neuro: Arousable, oriented x3, no focal deficit  Laboratory:  Recent Labs Lab 01/11/14 2005 01/12/14 0527  WBC 5.5 4.9  HGB 11.6* 11.8*  HCT 35.0* 34.0*  PLT 141* 126*    Recent Labs Lab 01/11/14 2005 01/12/14 0527  NA 144 137  K 3.5* 3.8  CL 100 97  CO2 31 27  BUN 12 12  CREATININE 1.08 1.10  CALCIUM 9.5 8.9  PROT 7.1  --   BILITOT 0.3  --   ALKPHOS 137*  --   ALT 28  --   AST 31  --   GLUCOSE 81 173*   CBG (last 3)   Recent Labs  01/12/14 0431 01/12/14 0526 01/12/14 0627  GLUCAP 165* 176* 176*   Drugs of Abuse     Component Value Date/Time  LABOPIA NONE DETECTED 01/12/2014 0538   LABOPIA NEG 04/04/2008 2337   COCAINSCRNUR POSITIVE* 01/12/2014 0538   COCAINSCRNUR NEG 04/04/2008 2337   LABBENZ POSITIVE* 01/12/2014 0538   LABBENZ NEG 04/04/2008 2337   AMPHETMU NONE DETECTED 01/12/2014 0538   AMPHETMU NEG 04/04/2008 2337   THCU NONE DETECTED 01/12/2014 0538   LABBARB NONE DETECTED 01/12/2014 0538    Alcohol Level     Component Value Date/Time   [facility] <11 01/12/2014 0527   Imaging/Diagnostic Tests:  Dg Chest Portable 1 View  01/11/2014   CLINICAL DATA:  Cough  EXAM: PORTABLE CHEST - 1 VIEW  COMPARISON:  Chest CT November 22, 2013 and chest radiograph August 12, 2013  FINDINGS: There is mild bibasilar atelectatic change. There is no frank edema or consolidation. Heart size and pulmonary vascularity are normal. Port-A-Cath tip is in the superior vena cava. No pneumothorax. No adenopathy.  IMPRESSION: Mild bibasilar atelectatic change.  No edema or consolidation.   Electronically Signed   By: Lowella Grip M.D.   On: 01/11/2014 21:02    Cordelia Poche, MD 01/12/2014, 8:27 AM PGY-1, [place] Intern pager: [phone], text pages welcome

## 2014-01-12 NOTE — Progress Notes (Signed)
Seen and examined.  Discussed with Dr. Lonny Prude.  Agree with his documentation and management.  Please see my cosign of the H&PE for my thoughts for today

## 2014-01-12 NOTE — Progress Notes (Signed)
Occupational Therapy Evaluation Patient Details Name: Monica [name] MRN: [id number] DOB: Jul 12, 1961 Today's Date: 01/12/2014    History of Present Illness Monica [name] is a 53 y.o. female presenting with frequent hypoglycemia . PMH is significant for Type 1 DM, Hepatitis C, colon cancer, hyperlipidemia and hypertension . Per chart -polypharmacy as patient is on many different sedating medications. Per MD -Could be coming down from cocaine.    Clinical Impression   PTA, pt lived with 62 yo daughter and Mom and was independent with ADL and mobility. Currently undergoing chemo treatments every other week.  Pt lethargic during eval. Ambulated with min A due to lethargy. Pt requires set up/S with ADL. Pt will need 24/7 S initially after D/C. Will defer need for RW to PT. Balance and mobility will most likely improve once pt is "less sedated". Pt will not need further OT, but recommend [facility] nursing to follow up with medication/diabetes mgnt after D/C. OT signing off.     Follow Up Recommendations  No OT follow up;Supervision/Assistance - 24 hour    Equipment Recommendations  None recommended by OT    Recommendations for Other Services       Precautions / Restrictions Precautions Precautions: Fall      Mobility Bed Mobility Overal bed mobility: Modified Independent                Transfers Overall transfer level: Needs assistance   Transfers: Sit to/from Stand Sit to Stand: Min guard         General transfer comment: LOB x 1 walking back from bathroom, requiring physical assist to prevent fall    Balance Overall balance assessment: Needs assistance   Sitting balance-Leahy Scale: Fair     Standing balance support: During functional activity Standing balance-Leahy Scale: Poor                              ADL Overall ADL's : Needs assistance/impaired     Grooming: Supervision/safety   Upper Body Bathing: Supervision/ safety;Set up   Lower  Body Bathing: Min guard   Upper Body Dressing : Supervision/safety;Set up   Lower Body Dressing: Min guard   Toilet Transfer: Minimal assistance;Ambulation;Comfort height toilet   Toileting- Clothing Manipulation and Hygiene: Supervision/safety       Functional mobility during ADLs: Minimal assistance General ADL Comments: impaired due to "unsteady"     Vision                     Perception     Praxis      Pertinent Vitals/Pain VSS; no c/o pain     Hand Dominance Right   Extremity/Trunk Assessment Upper Extremity Assessment Upper Extremity Assessment: Overall WFL for tasks assessed (c/o not being able to move L middle finger - holding it up while flexing other fingers. Once passively ranged, pt using without difficulty)   Lower Extremity Assessment Lower Extremity Assessment: Defer to PT evaluation; Pt c/o B foot numbness that "gets better when up and walking"   Cervical / Trunk Assessment Cervical / Trunk Assessment: Normal   Communication Communication Communication: No difficulties   Cognition Arousal/Alertness: Lethargic Behavior During Therapy: Flat affect Overall Cognitive Status: Impaired/Different from baseline Area of Impairment: Attention;Safety/judgement;Awareness   Current Attention Level: Selective     Safety/Judgement: Decreased awareness of safety;Decreased awareness of deficits Awareness: Emergent   General Comments: Thinks she can go home tonight   General Comments  Exercises       Shoulder Instructions      Home Living   Living Arrangements: Children;Parent (Has 79 yo daughter)                                      Prior Functioning/Environment Level of Independence: Independent        Comments: on disability    OT Diagnosis:     OT Problem List:     OT Treatment/Interventions:      OT Goals(Current goals can be found in the care plan section) Acute Rehab OT Goals Patient Stated Goal: to  go to daughter's graduation ceremony tonight  OT Frequency:     Barriers to D/C:            Co-evaluation              End of Session Nurse Communication: Mobility status  Activity Tolerance: Patient limited by lethargy Patient left: in chair;with call bell/phone within reach;with chair alarm set   Time: 1411-1430 OT Time Calculation (min): 19 min Charges:  OT General Charges $OT Visit: 1 Procedure OT Evaluation $Initial OT Evaluation Tier I: 1 Procedure OT Treatments $Self Care/Home Management : 8-22 mins G-Codes:    Dilon Lank,HILLARY 01-28-14, 3:41 PM   [facility], OTR/L  [phone] Jan 28, 2014

## 2014-01-13 DIAGNOSIS — B1710 Acute hepatitis C without hepatic coma: Secondary | ICD-10-CM

## 2014-01-13 LAB — GLUCOSE, CAPILLARY
GLUCOSE-CAPILLARY: 197 mg/dL — AB (ref 70–99)
Glucose-Capillary: 180 mg/dL — ABNORMAL HIGH (ref 70–99)
Glucose-Capillary: 245 mg/dL — ABNORMAL HIGH (ref 70–99)

## 2014-01-13 MED ORDER — INSULIN GLARGINE 100 UNIT/ML ~~LOC~~ SOLN: 15.0000 [IU] | Freq: Every day | SUBCUTANEOUS | Status: DC

## 2014-01-13 MED ORDER — ALBUTEROL SULFATE (2.5 MG/3ML) 0.083% IN NEBU: 2.5000 mg | INHALATION_SOLUTION | Freq: Once | RESPIRATORY_TRACT | Status: DC

## 2014-01-13 MED ORDER — HEPARIN SOD (PORK) LOCK FLUSH 100 UNIT/ML IV SOLN
500.0000 [IU] | INTRAVENOUS | Status: AC | PRN
  Administered 2014-01-13: 500 [IU]

## 2014-01-13 MED ORDER — ALBUTEROL SULFATE (2.5 MG/3ML) 0.083% IN NEBU: 2.5000 mg | INHALATION_SOLUTION | RESPIRATORY_TRACT | Status: DC | PRN

## 2014-01-13 NOTE — Progress Notes (Signed)
Reviewed discharge instructions with patient and she stated her understanding.  Patient verbally able to state how she needs to take her medications.  Discharged home via wheelchair with mother.  Sanda Linger

## 2014-01-13 NOTE — Progress Notes (Signed)
Family Medicine Teaching Service Daily Progress Note Intern Pager: [phone]  Patient name: Monica [name] Medical record number: [id number] Date of birth: 1960-12-28 Age: 53 y.o. Gender: female  Primary Care Provider: Dewain Penning, MD Consultants: None Code Status: Full code; no futile procedures  Pt Overview and Major Events to Date:  6/10: patient admitted  Assessment and Plan: JODEE WAGENAAR is a 53 y.o. female presenting with frequent hypoglycemia . PMH is significant for Type 1 DM, Hepatitis C, colon cancer, hyperlipidemia and hypertension   # Recurrent hypoglycemia: CBG values have stabilized on D5 fluids. Currently back on insulin and tolerating a diet Sensitive sliding scale insulin Lantus 15u for tomorrow  # Lethargy: Probably related to polypharmacy as patient is on many different sedating medications. Could also be coming down from cocaine with positive UDS. Improved greatly today. Follow-up mental status exam  Continue to hold most of her sedating medications Plan to start benzodiazepine today so patient does not withdraw  # Diabetes mellitus, type 1: last Hgb A1C of 6.9. 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 Patient admits to being a user of cocaine with last use the day before admission.  Will hold most home medications since most are sedating as stated in above problem Continue gabapentin in AM Continue Tramadol PRN  # CKD 3: baseline creatinine of 1.2-1.4. Creatinine on admission of 1.08. Last creatinine of 1.10  # Bipolar disorder: No active issues.  Continue lamotrigine and ziprasidone (although is sedating)  # Colon cancer: metastatic; followed by Dr. Alvy Bimler; currently undergoing chemotherapy.   FEN/GI: carb modified diet  Prophylaxis: Protonix, heparin subq  Disposition: Discharge home today  Subjective:  Patient  states no issues overnight. She does not remember much of the last two days.  Objective: Temp:  [98.4 F (36.9 C)-98.9 F (37.2 C)] 98.7 F (37.1 C) (06/12 0729) Pulse Rate:  [53-65] 63 (06/12 0729) Resp:  [16-18] 16 (06/12 0729) BP: (109-141)/(62-81) 116/62 mmHg (06/12 0729) SpO2:  [90 %-93 %] 92 % (06/12 0729) Weight:  [174 lb (78.926 kg)] 174 lb (78.926 kg) (06/12 0406)  Physical Exam: General: Laying in bed, in no acute distress Cardiovascular: Regular rate/rhythm, no murmur Respiratory: Clear to auscultation bilaterally, no wheezing Abdomen: Soft, non-tender, non-distended Extremities: No calf tenderness no swelling Neuro: Alert and oriented x3  Laboratory:  Recent Labs Lab 01/11/14 2005 01/12/14 0527  WBC 5.5 4.9  HGB 11.6* 11.8*  HCT 35.0* 34.0*  PLT 141* 126*    Recent Labs Lab 01/11/14 2005 01/12/14 0527  NA 144 137  K 3.5* 3.8  CL 100 97  CO2 31 27  BUN 12 12  CREATININE 1.08 1.10  CALCIUM 9.5 8.9  PROT 7.1  --   BILITOT 0.3  --   ALKPHOS 137*  --   ALT 28  --   AST 31  --   GLUCOSE 81 173*   CBG (last 3)   Recent Labs  01/12/14 2051 01/13/14 0340 01/13/14 0732  GLUCAP 167* 180* 245*   Hemoglobin A1C  Date Value Ref Range Status  01/12/2014 6.5* <5.7 % Final    Imaging/Diagnostic Tests:  Dg Chest Portable 1 View  01/11/2014   CLINICAL DATA:  Cough  EXAM: PORTABLE CHEST - 1 VIEW  COMPARISON:  Chest CT November 22, 2013 and chest radiograph August 12, 2013  FINDINGS: There is mild bibasilar atelectatic change. There is  no frank edema or consolidation. Heart size and pulmonary vascularity are normal. Port-A-Cath tip is in the superior vena cava. No pneumothorax. No adenopathy.  IMPRESSION: Mild bibasilar atelectatic change.  No edema or consolidation.   Electronically Signed   By: Lowella Grip M.D.   On: 01/11/2014 21:02    Cordelia Poche, MD 01/13/2014, 9:56 AM PGY-1, [place] Intern pager: [phone], text pages  welcome

## 2014-01-13 NOTE — Discharge Instructions (Signed)
Ms. Bolding, we have decreased your Lantus dose to 15 units daily. Please continue using this dose until you see Dr. Bridgett Larsson next week Wednesday (01/13/2014).  Low Blood Sugar Low blood sugar (hypoglycemia) means that the level of sugar in your blood is lower than it should be. Signs of low blood sugar include:  Getting sweaty.  Feeling hungry.  Feeling dizzy or weak.  Feeling sleepier than normal.  Feeling nervous.  Headaches.  Having a fast heartbeat. Low blood sugar can happen fast and can be an emergency. Your doctor can do tests to check your blood sugar level. You can have low blood sugar and not have diabetes. HOME CARE  Check your blood sugar as told by your doctor. If it is less than 70 mg/dl or as told by your doctor, take 1 of the following:  3 to 4 glucose tablets.   cup clear juice.   cup soda pop, not diet.  1 cup milk.  5 to 6 hard candies.  Recheck blood sugar after 15 minutes. Repeat until it is at the right level.  Eat a snack if it is more than 1 hour until the next meal.  Only take medicine as told by your doctor.  Do not skip meals. Eat on time.  Do not drink alcohol except with meals.  Check your blood glucose before driving.  Check your blood glucose before and after exercise.  Always carry treatment with you, such as glucose pills.  Always wear a medical alert bracelet if you have diabetes. GET HELP RIGHT AWAY IF:   Your blood glucose goes below 70 mg/dl or as told by your doctor, and you:  Are confused.  Are not able to swallow.  Pass out (faint).  You cannot treat yourself. You may need someone to help you.  You have low blood sugar problems often.  You have problems from your medicines.  You are not feeling better after 3 to 4 days.  You have vision changes. MAKE SURE YOU:   Understand these instructions.  Will watch this condition.  Will get help right away if you are not doing well or get worse. Document  Released: 10/15/2009 Document Revised: 10/13/2011 Document Reviewed: 10/15/2009 [facility] Patient Information 2014 [place], [place].

## 2014-01-13 NOTE — Progress Notes (Addendum)
Inpatient Diabetes Program Recommendations  AACE/ADA: New Consensus Statement on Inpatient Glycemic Control (2013)  Target Ranges:  Prepandial:   less than 140 mg/dL      Peak postprandial:   less than 180 mg/dL (1-2 hours)      Critically ill patients:  140 - 180 mg/dL  Results for Monica [name], Monica [name] (MRN [id number]) as of 01/13/2014 12:20  Ref. Range 01/12/2014 11:26 01/12/2014 16:50 01/12/2014 20:51 01/13/2014 03:40 01/13/2014 07:32  Glucose-Capillary Latest Range: 70-99 mg/dL 150 (H) 135 (H) 167 (H) 180 (H) 245 (H)   Inpatient Diabetes Program Recommendations Insulin - Meal Coverage: consider adding Novolog 3 units TID with meals per Glycemic Control Order-set Will likely need more basal insulin with fasting CBG >200.  Thank you  Raoul Pitch BSN, RN,CDE Inpatient Diabetes Coordinator [phone] (team pager)

## 2014-01-13 NOTE — Clinical Social Work Note (Signed)
CSW received consult for substance abuse. Visited with patient and offered substance abuse resources. Patient declined resources and  reported that she has used [facility] ([facility]) in the past. CSW signing off - patient declined resources and is discharging home today.  Monica [name], MSW, LCSW [phone]

## 2014-01-13 NOTE — Evaluation (Signed)
Physical Therapy Evaluation Patient Details Name: Monica [name] MRN: [id number] DOB: June 22, 1961 Today's Date: 01/13/2014   History of Present Illness  Monica [name] is a 53 y.o. female presenting with frequent hypoglycemia . PMH is significant for Type 1 DM, Hepatitis C, colon cancer, hyperlipidemia and hypertension . polypharmacy as patient is on many different sedating medications. Per MD -Could be coming down from cocaine.   Clinical Impression  Pt pleasant with flat affect and decreased awareness of balance deficits. Pt eager to return home and not fully receptive to recommendation of further therapy. Pt will benefit from acute therapy to address below deficits in order to maximize balance, gait and independence to return pt to PLOF.     Follow Up Recommendations Home health PT;Supervision for mobility/OOB    Equipment Recommendations  Rolling walker with 5" wheels    Recommendations for Other Services       Precautions / Restrictions Precautions Precautions: Fall Restrictions Weight Bearing Restrictions: No      Mobility  Bed Mobility               General bed mobility comments: pt EOB on arrival  Transfers       Sit to Stand: Min guard         General transfer comment: cues for safety  Ambulation/Gait Ambulation/Gait assistance: Min guard Ambulation Distance (Feet): 200 Feet Assistive device: None;Rolling walker (2 wheeled) Gait Pattern/deviations: Step-through pattern;Decreased stride length   Gait velocity interpretation: Below normal speed for age/gender General Gait Details: pt ambulated circle without AD with increased sway and guarding for safety. With use of RW for last 20' improved stability with cues for use  Stairs Stairs: Yes Stairs assistance: Min guard Stair Management: Step to pattern;Forwards;One rail Left Number of Stairs: 8 General stair comments: cues for sequence up with left and down with right worked best for  pt  Wheelchair Mobility    Modified Rankin (Stroke Patients Only)       Balance Overall balance assessment: Needs assistance   Sitting balance-Leahy Scale: Fair       Standing balance-Leahy Scale: Fair                               Pertinent Vitals/Pain No pain HR 84 sats 93% on Ra Pain 5/10 bil feet dorsal and plantar surfaces    Home Living Family/patient expects to be discharged to:: Private residence Living Arrangements: Children;Parent (Has 33 yo daughter) Available Help at Discharge: Available PRN/intermittently Type of Home: Apartment Home Access: Level entry     Home Layout: Two level Home Equipment: None      Prior Function Level of Independence: Independent         Comments: on disability     Hand Dominance   Dominant Hand: Right    Extremity/Trunk Assessment   Upper Extremity Assessment: Defer to OT evaluation           Lower Extremity Assessment: Generalized weakness;RLE deficits/detail;LLE deficits/detail RLE Deficits / Details: hip flexion 4/5, quads 3/5, dorsiflexion 3/5, hamstring 3+/5 LLE Deficits / Details: hip flexion 4/5, quads 3/5, dorsiflexion 3/5, hamstring 3+/5  Cervical / Trunk Assessment: Normal  Communication   Communication: No difficulties  Cognition Arousal/Alertness: Awake/alert Behavior During Therapy: Flat affect Overall Cognitive Status: Impaired/Different from baseline Area of Impairment: Attention;Safety/judgement;Awareness   Current Attention Level: Alternating     Safety/Judgement: Decreased awareness of safety;Decreased awareness of deficits  General Comments      Exercises        Assessment/Plan    PT Assessment Patient needs continued PT services  PT Diagnosis Difficulty walking;Generalized weakness   PT Problem List Decreased strength;Decreased balance;Decreased activity tolerance;Decreased knowledge of use of DME  PT Treatment Interventions Gait training;DME  instruction;Stair training;Functional mobility training;Therapeutic activities;Therapeutic exercise;Patient/family education;Balance training   PT Goals (Current goals can be found in the Care Plan section) Acute Rehab PT Goals Patient Stated Goal: return home and go to the pool PT Goal Formulation: With patient Time For Goal Achievement: 01/27/14 Potential to Achieve Goals: Good    Frequency Min 3X/week   Barriers to discharge Decreased caregiver support      Co-evaluation               End of Session Equipment Utilized During Treatment: Gait belt Activity Tolerance: Patient tolerated treatment well Patient left: in chair;with call bell/phone within reach;with chair alarm set           Time: 3202-3343 PT Time Calculation (min): 16 min   Charges:   PT Evaluation $Initial PT Evaluation Tier I: 1 Procedure PT Treatments $Gait Training: 8-22 mins   PT G CodesMelford Aase 01/13/2014, 10:39 AM Elwyn Reach, [place]

## 2014-01-13 NOTE — Care Management Note (Signed)
    Page 1 of 1   01/13/2014     2:38:57 PM CARE MANAGEMENT NOTE 01/13/2014  Patient:  Monica [name], Monica [name]   Account Number:  [id number]  Date Initiated:  01/13/2014  Documentation initiated by:  GRAVES-BIGELOW,Jester Klingberg  Subjective/Objective Assessment:   Pt admitted for hypoglycemia. Pt states she is from home with mother. CM did call CSW to provide resources to pt for substance abuse.     Action/Plan:   Pt not able to receive HHPT due to not having a qualifying dx for therapy. Pt refused [place] services at this time. MD aware and pt to f/u in the clinic.   Anticipated DC Date:  01/13/2014   Anticipated DC Plan:  [place]  CM consult      Choice offered to / List presented to:     DME arranged  Vassie Moselle      DME agency  [facility].        Status of service:  Completed, signed off Medicare Important Message given?  NO (If response is "NO", the following Medicare IM given date fields will be blank) Date Medicare IM given:   Date Additional Medicare IM given:    Discharge Disposition:  HOME/SELF CARE  Per UR Regulation:  Reviewed for med. necessity/level of care/duration of stay  If discussed at [place] of Stay Meetings, dates discussed:    Comments:

## 2014-01-13 NOTE — Progress Notes (Signed)
Seen and examined.  Discussed with in rounds.  Likely DC today.  I hope she is sincere when she tells me she plans to quit smoking and stop using cocaine.  Await PT evaluation for safety of ambulation prior to DC.

## 2014-01-14 NOTE — Progress Notes (Signed)
Seen and examined on 6/12.  Agree with Dr. Lisbeth Ply management and documentation

## 2014-01-15 NOTE — Discharge Summary (Signed)
[facility] Discharge Summary  Patient name: Monica [name] Medical record number: [id number] Date of birth: Sep 19, 1960 Age: 53 y.o. Gender: female Date of Admission: 01/11/2014  Date of Discharge: 01/13/2014 Admitting Physician: Zigmund Gottron, MD  Primary Care Provider: Dewain Penning, MD Consultants: None  Indication for Hospitalization: Recurrent hypoglycemia  Discharge Diagnoses/Problem List:  Recurrent hypoglycemia Lethargy Diabetes mellitus, type 1 Substance abuse/polypharmacy/chronic pain   Disposition: Discharge home  Discharge Condition: Good  Discharge Exam: General: Laying in bed, in no acute distress  Cardiovascular: Regular rate/rhythm, no murmur  Respiratory: Clear to auscultation bilaterally, no wheezing  Abdomen: Soft, non-tender, non-distended  Extremities: No calf tenderness no swelling  Neuro: Alert and oriented x3  Brief Hospital Course:   HPI: Monica [name] is a 53 y.o. female presenting with frequent hypoglycemia. Patient lethargic at time of encounter and was not always cooperative with providing a history. Patient states this morning she took Lantus 25 units, initially with no problems. In the afternoon, she reports falling to the ground. She remained conscious the entire time. Her mom ended up calling EMS who found her to be hypoglycemic. She was given D50, orange juice and a donut but had another episode and EMS gave her glucagon and took her to the [facility] ED. She reports no nausea, no vomiting, no loss of consciousness. She reports a similar event occurred a few days ago at home but she did not seek medical attention. No chest pain, shortness of breath, abdominal pain, diarrhea or dysuria. She is an active cocaine user with last use the day prior to admission.  Course: Initial UDS positive for cocaine. Blood sugar on admission was patient initially in 67s. Patient on D5 drip when admitted. Blood sugars  continued to hover around 80-110s and eventually improved to 150s. We started patient on Lanuts 10u and sliding scale for meals. Patient's mental status was also somewhat altered as she was very lethargic. We did not continue her home medications that caused sedation. As her lethargy improved, we started her on a diet and discontinued D5 fluids. Patient's blood sugars continued to remain stable and her Lantus was increased to 15 units before discharge.  Issues for Follow Up:  1. Smoking cessation 2. Cocaine use cessation. Seems to have direct effect on patient's ability to competently administer medication and risks patient overdosing on insulin. 3. Diabetes education  Significant Procedures: None  Significant Labs and Imaging:   Recent Labs Lab 01/11/14 2005 01/12/14 0527  WBC 5.5 4.9  HGB 11.6* 11.8*  HCT 35.0* 34.0*  PLT 141* 126*    Recent Labs Lab 01/11/14 2005 01/12/14 0527  NA 144 137  K 3.5* 3.8  CL 100 97  CO2 31 27  GLUCOSE 81 173*  BUN 12 12  CREATININE 1.08 1.10  CALCIUM 9.5 8.9  ALKPHOS 137*  --   AST 31  --   ALT 28  --   ALBUMIN 3.4*  --    CBG (last 3)   Recent Labs  01/13/14 0340 01/13/14 0732 01/13/14 1118  GLUCAP 180* 245* 197*   Dg Chest Portable 1 View  01/11/2014   CLINICAL DATA:  Cough  EXAM: PORTABLE CHEST - 1 VIEW  COMPARISON:  Chest CT November 22, 2013 and chest radiograph August 12, 2013  FINDINGS: There is mild bibasilar atelectatic change. There is no frank edema or consolidation. Heart size and pulmonary vascularity are normal. Port-A-Cath tip is in the superior vena cava. No pneumothorax. No  adenopathy.  IMPRESSION: Mild bibasilar atelectatic change.  No edema or consolidation.   Electronically Signed   By: Lowella Grip M.D.   On: 01/11/2014 21:02   Results/Tests Pending at Time of Discharge: None  Discharge Medications:    Medication List    STOP taking these medications       hydrOXYzine 25 MG tablet  Commonly known as:   ATARAX/VISTARIL     ibuprofen 800 MG tablet  Commonly known as:  ADVIL,MOTRIN      TAKE these medications       ACCU-CHEK AVIVA PLUS test strip  Generic drug:  glucose blood  USE AS DIRECTED     atorvastatin 40 MG tablet  Commonly known as:  LIPITOR  Take 40 mg by mouth daily.     buprenorphine-naloxone 8-2 MG Subl SL tablet  Commonly known as:  SUBOXONE  Place 1 tablet under the tongue 3 (three) times daily.     clonazePAM 1 MG tablet  Commonly known as:  KLONOPIN  Take 1 mg by mouth 3 (three) times daily as needed for anxiety.     diazepam 10 MG tablet  Commonly known as:  VALIUM  Take 10 mg by mouth 3 (three) times daily.     furosemide 40 MG tablet  Commonly known as:  LASIX  Take 1 tablet (40 mg total) by mouth 2 (two) times daily.     gabapentin 300 MG capsule  Commonly known as:  NEURONTIN  Take 2 capsules (600 mg total) by mouth 2 (two) times daily.     insulin glargine 100 UNIT/ML injection  Commonly known as:  LANTUS  Inject 0.15 mLs (15 Units total) into the skin daily.     insulin lispro 100 UNIT/ML injection  Commonly known as:  HUMALOG  Inject 2-8 Units into the skin See admin instructions. Three times per day per sliding scale     lamoTRIgine 100 MG tablet  Commonly known as:  LAMICTAL  Take 200 mg by mouth every morning.     lidocaine-prilocaine cream  Commonly known as:  EMLA  Apply 1 application topically as needed (for pain).     omeprazole 20 MG capsule  Commonly known as:  PRILOSEC  Take 20 mg by mouth every morning.     ondansetron 8 MG tablet  Commonly known as:  ZOFRAN  Take 1 tablet (8 mg total) by mouth every 8 (eight) hours as needed.     polyethylene glycol packet  Commonly known as:  MIRALAX / GLYCOLAX  Take 17 g by mouth daily as needed for mild constipation.     prochlorperazine 10 MG tablet  Commonly known as:  COMPAZINE  Take 1 tablet (10 mg total) by mouth every 6 (six) hours as needed (Nausea or vomiting).      ramelteon 8 MG tablet  Commonly known as:  ROZEREM  Take 8 mg by mouth at bedtime.     traMADol 50 MG tablet  Commonly known as:  ULTRAM  Take 2 tablets (100 mg total) by mouth every 6 (six) hours as needed.     ziprasidone 80 MG capsule  Commonly known as:  GEODON  Take 160 mg by mouth at bedtime.        Discharge Instructions: Please refer to Patient Instructions section of EMR for full details.  Patient was counseled important signs and symptoms that should prompt return to medical care, changes in medications, dietary instructions, activity restrictions, and follow up appointments.   Follow-Up  Appointments:     Follow-up Information   Follow up with Maryruth Eve, MD On 01/17/2014. (3:15PMAM, For hospital follow-up)    Specialty:  Family Medicine   Contact information:   [place] [id number] [phone]       Cordelia Poche, MD 01/15/2014, 1:48 PM PGY-1, [place]

## 2014-01-15 NOTE — Discharge Summary (Signed)
Seen and examined on the day of DC.  Agree with Dr. Nettey's documentation and management. 

## 2014-01-17 ENCOUNTER — Inpatient Hospital Stay: Payer: Self-pay | Admitting: Family Medicine

## 2014-01-18 ENCOUNTER — Encounter: Payer: Self-pay | Admitting: Hematology and Oncology

## 2014-01-18 ENCOUNTER — Other Ambulatory Visit (HOSPITAL_BASED_OUTPATIENT_CLINIC_OR_DEPARTMENT_OTHER): Payer: Medicaid Other

## 2014-01-18 ENCOUNTER — Telehealth: Payer: Self-pay | Admitting: Hematology and Oncology

## 2014-01-18 ENCOUNTER — Ambulatory Visit (HOSPITAL_BASED_OUTPATIENT_CLINIC_OR_DEPARTMENT_OTHER): Payer: Medicaid Other

## 2014-01-18 ENCOUNTER — Ambulatory Visit (HOSPITAL_BASED_OUTPATIENT_CLINIC_OR_DEPARTMENT_OTHER): Payer: Medicaid Other | Admitting: Hematology and Oncology

## 2014-01-18 VITALS — BP 118/74 | HR 79 | Temp 97.4°F | Resp 18 | Ht 65.0 in | Wt 162.3 lb

## 2014-01-18 DIAGNOSIS — N189 Chronic kidney disease, unspecified: Secondary | ICD-10-CM

## 2014-01-18 DIAGNOSIS — F1410 Cocaine abuse, uncomplicated: Secondary | ICD-10-CM

## 2014-01-18 DIAGNOSIS — E1349 Other specified diabetes mellitus with other diabetic neurological complication: Secondary | ICD-10-CM

## 2014-01-18 DIAGNOSIS — M25562 Pain in left knee: Secondary | ICD-10-CM

## 2014-01-18 DIAGNOSIS — E1340 Other specified diabetes mellitus with diabetic neuropathy, unspecified: Secondary | ICD-10-CM

## 2014-01-18 DIAGNOSIS — E11649 Type 2 diabetes mellitus with hypoglycemia without coma: Secondary | ICD-10-CM

## 2014-01-18 DIAGNOSIS — M25569 Pain in unspecified knee: Secondary | ICD-10-CM

## 2014-01-18 DIAGNOSIS — E1169 Type 2 diabetes mellitus with other specified complication: Secondary | ICD-10-CM

## 2014-01-18 DIAGNOSIS — C182 Malignant neoplasm of ascending colon: Secondary | ICD-10-CM

## 2014-01-18 DIAGNOSIS — C787 Secondary malignant neoplasm of liver and intrahepatic bile duct: Secondary | ICD-10-CM

## 2014-01-18 DIAGNOSIS — F1910 Other psychoactive substance abuse, uncomplicated: Secondary | ICD-10-CM

## 2014-01-18 DIAGNOSIS — Z5111 Encounter for antineoplastic chemotherapy: Secondary | ICD-10-CM

## 2014-01-18 DIAGNOSIS — C189 Malignant neoplasm of colon, unspecified: Secondary | ICD-10-CM

## 2014-01-18 DIAGNOSIS — M25561 Pain in right knee: Secondary | ICD-10-CM

## 2014-01-18 DIAGNOSIS — R109 Unspecified abdominal pain: Secondary | ICD-10-CM

## 2014-01-18 DIAGNOSIS — F17200 Nicotine dependence, unspecified, uncomplicated: Secondary | ICD-10-CM

## 2014-01-18 DIAGNOSIS — Z5112 Encounter for antineoplastic immunotherapy: Secondary | ICD-10-CM

## 2014-01-18 LAB — CBC WITH DIFFERENTIAL/PLATELET
BASO%: 1 % (ref 0.0–2.0)
Basophils Absolute: 0 10*3/uL (ref 0.0–0.1)
EOS%: 3.3 % (ref 0.0–7.0)
Eosinophils Absolute: 0.1 10*3/uL (ref 0.0–0.5)
HCT: 43.5 % (ref 34.8–46.6)
HGB: 14.6 g/dL (ref 11.6–15.9)
LYMPH%: 24.7 % (ref 14.0–49.7)
MCH: 29 pg (ref 25.1–34.0)
MCHC: 33.5 g/dL (ref 31.5–36.0)
MCV: 86.6 fL (ref 79.5–101.0)
MONO#: 0.5 10*3/uL (ref 0.1–0.9)
MONO%: 10.6 % (ref 0.0–14.0)
NEUT#: 2.6 10*3/uL (ref 1.5–6.5)
NEUT%: 60.4 % (ref 38.4–76.8)
Platelets: 160 10*3/uL (ref 145–400)
RBC: 5.02 10*6/uL (ref 3.70–5.45)
RDW: 17 % — ABNORMAL HIGH (ref 11.2–14.5)
WBC: 4.3 10*3/uL (ref 3.9–10.3)
lymph#: 1.1 10*3/uL (ref 0.9–3.3)

## 2014-01-18 LAB — COMPREHENSIVE METABOLIC PANEL (CC13)
ALT: 25 U/L (ref 0–55)
AST: 33 U/L (ref 5–34)
Albumin: 3.4 g/dL — ABNORMAL LOW (ref 3.5–5.0)
Alkaline Phosphatase: 148 U/L (ref 40–150)
Anion Gap: 11 mEq/L (ref 3–11)
BILIRUBIN TOTAL: 0.51 mg/dL (ref 0.20–1.20)
BUN: 10.6 mg/dL (ref 7.0–26.0)
CO2: 31 meq/L — AB (ref 22–29)
CREATININE: 1.5 mg/dL — AB (ref 0.6–1.1)
Calcium: 9.8 mg/dL (ref 8.4–10.4)
Chloride: 100 mEq/L (ref 98–109)
Glucose: 238 mg/dl — ABNORMAL HIGH (ref 70–140)
Potassium: 3.9 mEq/L (ref 3.5–5.1)
SODIUM: 142 meq/L (ref 136–145)
Total Protein: 7.5 g/dL (ref 6.4–8.3)

## 2014-01-18 LAB — UA PROTEIN, DIPSTICK - CHCC: Protein, ur: NEGATIVE mg/dL

## 2014-01-18 MED ORDER — ONDANSETRON 8 MG/50ML IVPB (CHCC)
8.0000 mg | Freq: Once | INTRAVENOUS | Status: AC
  Administered 2014-01-18: 8 mg via INTRAVENOUS

## 2014-01-18 MED ORDER — ONDANSETRON 8 MG/NS 50 ML IVPB
INTRAVENOUS | Status: AC
  Filled 2014-01-18: qty 8

## 2014-01-18 MED ORDER — DEXAMETHASONE SODIUM PHOSPHATE 10 MG/ML IJ SOLN
INTRAMUSCULAR | Status: AC
  Filled 2014-01-18: qty 1

## 2014-01-18 MED ORDER — SODIUM CHLORIDE 0.9 % IV SOLN
5.0000 mg/kg | Freq: Once | INTRAVENOUS | Status: AC
  Administered 2014-01-18: 375 mg via INTRAVENOUS
  Filled 2014-01-18: qty 15

## 2014-01-18 MED ORDER — DEXAMETHASONE SODIUM PHOSPHATE 10 MG/ML IJ SOLN
10.0000 mg | Freq: Once | INTRAMUSCULAR | Status: AC
  Administered 2014-01-18: 10 mg via INTRAVENOUS

## 2014-01-18 MED ORDER — SODIUM CHLORIDE 0.9 % IV SOLN
Freq: Once | INTRAVENOUS | Status: AC
  Administered 2014-01-18: 10:00:00 via INTRAVENOUS

## 2014-01-18 MED ORDER — SODIUM CHLORIDE 0.9 % IV SOLN
2400.0000 mg/m2 | INTRAVENOUS | Status: DC
  Administered 2014-01-18: 4150 mg via INTRAVENOUS
  Filled 2014-01-18: qty 83

## 2014-01-18 MED ORDER — LEUCOVORIN CALCIUM INJECTION 350 MG
405.0000 mg/m2 | Freq: Once | INTRAVENOUS | Status: AC
  Administered 2014-01-18: 700 mg via INTRAVENOUS
  Filled 2014-01-18: qty 35

## 2014-01-18 NOTE — Assessment & Plan Note (Signed)
Clinically, she has no side effects from treatment apart from neuropathy. Oxaliplatin was discontinued recently. I recommend we continue on 5-FU, leucovorin and Avastin. Before she returns next month, I will order repeat imaging study to assess response to treatment. Due to upcoming holiday, I plan to delay treatment in the first week of July to the second week.

## 2014-01-18 NOTE — Assessment & Plan Note (Signed)
Hopefully, this will continue to improve with discontinuation of oxaliplatin.

## 2014-01-18 NOTE — Assessment & Plan Note (Signed)
I spent some time counseling the patient the importance of tobacco cessation. she is currently attempting to quit on her own 

## 2014-01-18 NOTE — Assessment & Plan Note (Signed)
She recently overdose on insulin. I would defer to primary care for management of her diabetes.

## 2014-01-18 NOTE — Assessment & Plan Note (Signed)
She was admitted to the hospital recently and tested positive for cocaine. I would defer to primary care physician for management.

## 2014-01-18 NOTE — Telephone Encounter (Signed)
gv and printed appt sched and avs for pt for June adn July....sed added tx. °

## 2014-01-18 NOTE — Progress Notes (Signed)
[place] OFFICE PROGRESS NOTE  Patient Care Team: Angelica Ran, MD as PCP - General (Family Medicine) Windy Kalata, MD as Consulting Physician (Nephrology) Leatrice Jewels Rayetta Pigg, MD as Consulting Physician (Psychiatry) Heath Lark, MD as Consulting Physician (Hematology and Oncology)  SUMMARY OF ONCOLOGIC HISTORY: Oncology History   Colon cancer, KRAS positive   Primary site: Colon and Rectum (Right)   Staging method: AJCC 7th Edition   Clinical: (T4b, N2b, M1) signed by Heath Lark, MD on 08/05/2013  9:52 AM   Pathologic: Stage IVB (T4b, N2b, M1b) signed by Heath Lark, MD on 08/05/2013 10:21 AM   Summary: Stage IVB (T4b, N2b, M1b)      Colon cancer   07/15/2013 - 07/25/2013 Hospital Admission The patient presented to the hospital with pain in her abdomen and underwent surgery for colon cancer   07/15/2013 Imaging CT scan of the abdomen show bowel dilatation suspicious for malignancy   07/17/2013 Tumor Marker Preoperative CEA was 2.2, normal   07/19/2013 Procedure Colonoscopy revealed ascending colon lesion with significant stricture and biopsy showed adenocarcinoma   07/20/2013 Surgery She underwent right hemicolectomy, cholecystectomy, liver biopsy, excision of abdominal wall mass and omental mass. During surgery, she was also found to have drop metastasis   08/29/2013 Imaging Repeat CT scan show persistent liver metastasis and new omental metastasis   08/31/2013 -  Chemotherapy The patient was started on palliative chemotherapy with FOLFOX, bolus 5-FU committed and oxaliplatin dose reduced due to  peripheral neuropathy   11/22/2013 Imaging Repeat CT scan showed near complete response to treatment.   11/23/2013 -  Chemotherapy Avastin was added to her chemotherapy regimen   12/21/2013 Adverse Reaction Oxaliplatin was omitted due to worsening peripheral neuropathy.    INTERVAL HISTORY: Please see below for problem oriented charting. She is seen prior to cycle  11 of chemotherapy. The patient was recently hospitalized for hypoglycemia. She admitted to accidental overdose on insulin. She complained to bilateral knee pain.  REVIEW OF SYSTEMS:   Constitutional: Denies fevers, chills or abnormal weight loss Eyes: Denies blurriness of vision Ears, nose, mouth, throat, and face: Denies mucositis or sore throat Respiratory: Denies cough, dyspnea or wheezes Cardiovascular: Denies palpitation, chest discomfort or lower extremity swelling Gastrointestinal:  Denies nausea, heartburn or change in bowel habits Skin: Denies abnormal skin rashes Lymphatics: Denies new lymphadenopathy or easy bruising Neurological:Denies numbness, tingling or new weaknesses. She has chronic neuropathy. Behavioral/Psych: Mood is stable, no new changes  All other systems were reviewed with the patient and are negative.  I have reviewed the past medical history, past surgical history, social history and family history with the patient and they are unchanged from previous note.  ALLERGIES:  has No Known Allergies.  MEDICATIONS:  Current Outpatient Prescriptions  Medication Sig Dispense Refill  . ACCU-CHEK AVIVA PLUS test strip USE AS DIRECTED  100 each  10  . atorvastatin (LIPITOR) 40 MG tablet Take 40 mg by mouth daily.      . buprenorphine-naloxone (SUBOXONE) 8-2 MG SUBL SL tablet Place 1 tablet under the tongue 3 (three) times daily.      . clonazePAM (KLONOPIN) 1 MG tablet Take 1 mg by mouth 3 (three) times daily as needed for anxiety.       . diazepam (VALIUM) 10 MG tablet Take 10 mg by mouth 3 (three) times daily.      . furosemide (LASIX) 40 MG tablet Take 1 tablet (40 mg total) by mouth 2 (two) times daily.  [facility]  tablet  5  . gabapentin (NEURONTIN) 300 MG capsule Take 2 capsules (600 mg total) by mouth 2 (two) times daily.  120 capsule  5  . insulin glargine (LANTUS) 100 UNIT/ML injection Inject 0.15 mLs (15 Units total) into the skin daily.  10 mL  0  . insulin lispro  (HUMALOG) 100 UNIT/ML injection Inject 2-8 Units into the skin See admin instructions. Three times per day per sliding scale      . lamoTRIgine (LAMICTAL) 100 MG tablet Take 200 mg by mouth every morning.       . lidocaine-prilocaine (EMLA) cream Apply 1 application topically as needed (for pain).      Marland Kitchen omeprazole (PRILOSEC) 20 MG capsule Take 20 mg by mouth every morning.      . ondansetron (ZOFRAN) 8 MG tablet Take 1 tablet (8 mg total) by mouth every 8 (eight) hours as needed.  30 tablet  1  . polyethylene glycol (MIRALAX / GLYCOLAX) packet Take 17 g by mouth daily as needed for mild constipation.      . prochlorperazine (COMPAZINE) 10 MG tablet Take 1 tablet (10 mg total) by mouth every 6 (six) hours as needed (Nausea or vomiting).  30 tablet  1  . ramelteon (ROZEREM) 8 MG tablet Take 8 mg by mouth at bedtime.      . traMADol (ULTRAM) 50 MG tablet Take 2 tablets (100 mg total) by mouth every 6 (six) hours as needed.  60 tablet  0  . ziprasidone (GEODON) 80 MG capsule Take 160 mg by mouth at bedtime.        No current facility-administered medications for this visit.    PHYSICAL EXAMINATION: ECOG PERFORMANCE STATUS: 1 - Symptomatic but completely ambulatory  Filed Vitals:   01/18/14 0932  BP: 118/74  Pulse: 79  Temp: 97.4 F (36.3 C)  Resp: 18   Filed Weights   01/18/14 0932  Weight: 162 lb 4.8 oz (73.619 kg)    GENERAL:alert, no distress and comfortable SKIN: skin color, texture, turgor are normal, no rashes or significant lesions EYES: normal, Conjunctiva are pink and non-injected, sclera clear OROPHARYNX:no exudate, no erythema and lips, buccal mucosa, and tongue normal . Poor dentition is noted NECK: supple, thyroid normal size, non-tender, without nodularity LYMPH:  no palpable lymphadenopathy in the cervical, axillary or inguinal LUNGS: clear to auscultation and percussion with normal breathing effort HEART: regular rate & rhythm and no murmurs and no lower extremity  edema ABDOMEN:abdomen soft, non-tender and normal bowel sounds Musculoskeletal:no cyanosis of digits and no clubbing  NEURO: alert & oriented x 3 with fluent speech, no focal motor/sensory deficits  LABORATORY DATA:  I have reviewed the data as listed    Component Value Date/Time   NA 137 01/12/2014 0527   NA 141 01/04/2014 0813   K 3.8 01/12/2014 0527   K 4.0 01/04/2014 0813   CL 97 01/12/2014 0527   CO2 27 01/12/2014 0527   CO2 23 01/04/2014 0813   GLUCOSE 173* 01/12/2014 0527   GLUCOSE 299* 01/04/2014 0813   BUN 12 01/12/2014 0527   BUN 21.7 01/04/2014 0813   CREATININE 1.10 01/12/2014 0527   CREATININE 1.4* 01/04/2014 0813   CREATININE 1.38* 05/26/2013 1703   CALCIUM 8.9 01/12/2014 0527   CALCIUM 9.0 01/04/2014 0813   PROT 7.1 01/11/2014 2005   PROT 7.8 01/04/2014 0813   ALBUMIN 3.4* 01/11/2014 2005   ALBUMIN 3.6 01/04/2014 0813   AST 31 01/11/2014 2005   AST 25 01/04/2014 0813  ALT 28 01/11/2014 2005   ALT 23 01/04/2014 0813   ALKPHOS 137* 01/11/2014 2005   ALKPHOS 127 01/04/2014 0813   BILITOT 0.3 01/11/2014 2005   BILITOT 0.22 01/04/2014 0813   GFRNONAA 57* 01/12/2014 0527   GFRNONAA 44* 05/26/2013 1703   GFRAA 66* 01/12/2014 0527   GFRAA 51* 05/26/2013 1703    No results found for this basename: SPEP, UPEP,  kappa and lambda light chains    Lab Results  Component Value Date   WBC 4.3 01/18/2014   NEUTROABS 2.6 01/18/2014   HGB 14.6 01/18/2014   HCT 43.5 01/18/2014   MCV 86.6 01/18/2014   PLT 160 01/18/2014      Chemistry      Component Value Date/Time   NA 137 01/12/2014 0527   NA 141 01/04/2014 0813   K 3.8 01/12/2014 0527   K 4.0 01/04/2014 0813   CL 97 01/12/2014 0527   CO2 27 01/12/2014 0527   CO2 23 01/04/2014 0813   BUN 12 01/12/2014 0527   BUN 21.7 01/04/2014 0813   CREATININE 1.10 01/12/2014 0527   CREATININE 1.4* 01/04/2014 0813   CREATININE 1.38* 05/26/2013 1703      Component Value Date/Time   CALCIUM 8.9 01/12/2014 0527   CALCIUM 9.0 01/04/2014 0813   ALKPHOS 137* 01/11/2014 2005    ALKPHOS 127 01/04/2014 0813   AST 31 01/11/2014 2005   AST 25 01/04/2014 0813   ALT 28 01/11/2014 2005   ALT 23 01/04/2014 0813   BILITOT 0.3 01/11/2014 2005   BILITOT 0.22 01/04/2014 0813     ASSESSMENT & PLAN:  Colon cancer Clinically, she has no side effects from treatment apart from neuropathy. Oxaliplatin was discontinued recently. I recommend we continue on 5-FU, leucovorin and Avastin. Before she returns next month, I will order repeat imaging study to assess response to treatment. Due to upcoming holiday, I plan to delay treatment in the first week of July to the second week.  TOBACCO ABUSE I spent some time counseling the patient the importance of tobacco cessation. she is currently attempting to quit on her own   SUBSTANCE ABUSE, MULTIPLE She was admitted to the hospital recently and tested positive for cocaine. I would defer to primary care physician for management.  Severe diabetic hypoglycemia She recently overdose on insulin. I would defer to primary care for management of her diabetes.  Neuropathy due to secondary diabetes Hopefully, this will continue to improve with discontinuation of oxaliplatin.  Bilateral knee pain This is not due to metastatic disease. Due to history of polysubstance abuse, I will not prescribe narcotic pain medication for her and recommend over-the-counter analgesics only.   Orders Placed This Encounter  Procedures  . CT Chest W Contrast    Standing Status: Future     Number of Occurrences:      Standing Expiration Date: 03/20/2015    Order Specific Question:  Reason for Exam (SYMPTOM  OR DIAGNOSIS REQUIRED)    Answer:  assess response toRx, colon ca    Order Specific Question:  Is the patient pregnant?    Answer:  No    Order Specific Question:  Preferred imaging location?    Answer:  [facility]  . CT Abdomen Pelvis W Contrast    Standing Status: Future     Number of Occurrences:      Standing Expiration Date: 04/20/2015     Order Specific Question:  Reason for Exam (SYMPTOM  OR DIAGNOSIS REQUIRED)    Answer:  colon ca,assess reponse to Rx    Order Specific Question:  Is the patient pregnant?    Answer:  No    Order Specific Question:  Preferred imaging location?    Answer:  [facility]   All questions were answered. The patient knows to call the clinic with any problems, questions or concerns. No barriers to learning was detected.   [place], [place], MD 01/18/2014 9:33 AM

## 2014-01-18 NOTE — Patient Instructions (Addendum)
[place] Discharge Instructions for Patients Receiving Chemotherapy  Today you received the following chemotherapy agents avastin/leucovorin/flourouracil.    To help prevent nausea and vomiting after your treatment, we encourage you to take your nausea medication as directed.     If you develop nausea and vomiting that is not controlled by your nausea medication, call the clinic.   BELOW ARE SYMPTOMS THAT SHOULD BE REPORTED IMMEDIATELY:  *FEVER GREATER THAN 100.5 F  *CHILLS WITH OR WITHOUT FEVER  NAUSEA AND VOMITING THAT IS NOT CONTROLLED WITH YOUR NAUSEA MEDICATION  *UNUSUAL SHORTNESS OF BREATH  *UNUSUAL BRUISING OR BLEEDING  TENDERNESS IN MOUTH AND THROAT WITH OR WITHOUT PRESENCE OF ULCERS  *URINARY PROBLEMS  *BOWEL PROBLEMS  UNUSUAL RASH Items with * indicate a potential emergency and should be followed up as soon as possible.  Feel free to call the clinic you have any questions or concerns. The clinic phone number is [phone].

## 2014-01-18 NOTE — Assessment & Plan Note (Signed)
This is not due to metastatic disease. Due to history of polysubstance abuse, I will not prescribe narcotic pain medication for her and recommend over-the-counter analgesics only.

## 2014-01-19 ENCOUNTER — Encounter: Payer: Self-pay | Admitting: *Deleted

## 2014-01-19 NOTE — Progress Notes (Signed)
[place] Social Work  Clinical Social Work was referred by Therapist, sports to review and complete healthcare advance directives.  Clinical Social Worker met with patient in infusion room.  The patient designated mother Jeannetta Ellis as their primary healthcare agent and father Riley Nearing as their secondary agent.  Patient also completed healthcare living will.    Clinical Social Worker notarized documents and made copies for patient/family. Clinical Social Worker will send documents to medical records to be scanned into patient's chart.   Clinical Social Worker encouraged patient/family to contact with any additional questions or concerns.  Polo Riley, MSW, [place] Worker [facility] [phone]

## 2014-01-20 ENCOUNTER — Ambulatory Visit (HOSPITAL_BASED_OUTPATIENT_CLINIC_OR_DEPARTMENT_OTHER): Payer: Medicaid Other

## 2014-01-20 VITALS — BP 126/77 | HR 68 | Temp 98.1°F | Resp 15

## 2014-01-20 DIAGNOSIS — C189 Malignant neoplasm of colon, unspecified: Secondary | ICD-10-CM

## 2014-01-20 DIAGNOSIS — C182 Malignant neoplasm of ascending colon: Secondary | ICD-10-CM

## 2014-01-20 DIAGNOSIS — Z452 Encounter for adjustment and management of vascular access device: Secondary | ICD-10-CM

## 2014-01-20 MED ORDER — HEPARIN SOD (PORK) LOCK FLUSH 100 UNIT/ML IV SOLN
500.0000 [IU] | Freq: Once | INTRAVENOUS | Status: AC | PRN
  Administered 2014-01-20: 500 [IU]
  Filled 2014-01-20: qty 5

## 2014-01-20 MED ORDER — SODIUM CHLORIDE 0.9 % IJ SOLN
10.0000 mL | INTRAMUSCULAR | Status: DC | PRN
  Administered 2014-01-20: 10 mL
  Filled 2014-01-20: qty 10

## 2014-02-01 ENCOUNTER — Ambulatory Visit: Payer: Self-pay

## 2014-02-01 ENCOUNTER — Other Ambulatory Visit: Payer: Self-pay

## 2014-02-07 ENCOUNTER — Other Ambulatory Visit (HOSPITAL_COMMUNITY): Payer: Self-pay | Admitting: Family Medicine

## 2014-02-07 ENCOUNTER — Encounter: Payer: Self-pay | Admitting: *Deleted

## 2014-02-07 NOTE — Telephone Encounter (Signed)
Left voice message for pt to call and schedule an appt for an office visit to meet new PCP.  Derl Barrow, RN

## 2014-02-07 NOTE — Telephone Encounter (Signed)
Hi! Could you ask this patient to make a f/u w/ me as her convenience? I filled the script, but she's a cancer patient so I'd like to establish a relationship sooner rather than later (early August would be 21mths since last PCP appt)  Thanks!!!

## 2014-02-08 ENCOUNTER — Other Ambulatory Visit: Payer: Self-pay

## 2014-02-08 ENCOUNTER — Other Ambulatory Visit (HOSPITAL_BASED_OUTPATIENT_CLINIC_OR_DEPARTMENT_OTHER): Payer: Medicaid Other

## 2014-02-08 ENCOUNTER — Ambulatory Visit: Payer: Self-pay

## 2014-02-08 ENCOUNTER — Other Ambulatory Visit: Payer: Self-pay | Admitting: Hematology and Oncology

## 2014-02-08 ENCOUNTER — Ambulatory Visit (HOSPITAL_BASED_OUTPATIENT_CLINIC_OR_DEPARTMENT_OTHER): Payer: Medicaid Other

## 2014-02-08 VITALS — BP 125/75 | HR 66 | Temp 98.0°F | Resp 18

## 2014-02-08 DIAGNOSIS — C189 Malignant neoplasm of colon, unspecified: Secondary | ICD-10-CM

## 2014-02-08 DIAGNOSIS — C787 Secondary malignant neoplasm of liver and intrahepatic bile duct: Secondary | ICD-10-CM

## 2014-02-08 DIAGNOSIS — C182 Malignant neoplasm of ascending colon: Secondary | ICD-10-CM

## 2014-02-08 DIAGNOSIS — Z5112 Encounter for antineoplastic immunotherapy: Secondary | ICD-10-CM

## 2014-02-08 LAB — CBC WITH DIFFERENTIAL/PLATELET
BASO%: 1.3 % (ref 0.0–2.0)
BASOS ABS: 0 10*3/uL (ref 0.0–0.1)
EOS ABS: 0.1 10*3/uL (ref 0.0–0.5)
EOS%: 4.2 % (ref 0.0–7.0)
HEMATOCRIT: 41.3 % (ref 34.8–46.6)
HEMOGLOBIN: 13.6 g/dL (ref 11.6–15.9)
LYMPH%: 42.5 % (ref 14.0–49.7)
MCH: 29.2 pg (ref 25.1–34.0)
MCHC: 32.8 g/dL (ref 31.5–36.0)
MCV: 89 fL (ref 79.5–101.0)
MONO#: 0.3 10*3/uL (ref 0.1–0.9)
MONO%: 10.2 % (ref 0.0–14.0)
NEUT%: 41.8 % (ref 38.4–76.8)
NEUTROS ABS: 1.1 10*3/uL — AB (ref 1.5–6.5)
PLATELETS: 173 10*3/uL (ref 145–400)
RBC: 4.65 10*6/uL (ref 3.70–5.45)
RDW: 16.6 % — ABNORMAL HIGH (ref 11.2–14.5)
WBC: 2.7 10*3/uL — ABNORMAL LOW (ref 3.9–10.3)
lymph#: 1.1 10*3/uL (ref 0.9–3.3)

## 2014-02-08 LAB — COMPREHENSIVE METABOLIC PANEL (CC13)
ALT: 23 U/L (ref 0–55)
ANION GAP: 11 meq/L (ref 3–11)
AST: 24 U/L (ref 5–34)
Albumin: 3.3 g/dL — ABNORMAL LOW (ref 3.5–5.0)
Alkaline Phosphatase: 133 U/L (ref 40–150)
BUN: 16.2 mg/dL (ref 7.0–26.0)
CALCIUM: 9.4 mg/dL (ref 8.4–10.4)
CO2: 27 meq/L (ref 22–29)
CREATININE: 1.4 mg/dL — AB (ref 0.6–1.1)
Chloride: 100 mEq/L (ref 98–109)
Glucose: 444 mg/dl — ABNORMAL HIGH (ref 70–140)
Potassium: 3.8 mEq/L (ref 3.5–5.1)
Sodium: 137 mEq/L (ref 136–145)
Total Bilirubin: 0.45 mg/dL (ref 0.20–1.20)
Total Protein: 6.9 g/dL (ref 6.4–8.3)

## 2014-02-08 LAB — UA PROTEIN, DIPSTICK - CHCC: Protein, ur: NEGATIVE mg/dL

## 2014-02-08 MED ORDER — DEXAMETHASONE SODIUM PHOSPHATE 10 MG/ML IJ SOLN
10.0000 mg | Freq: Once | INTRAMUSCULAR | Status: AC
  Administered 2014-02-08: 10 mg via INTRAVENOUS

## 2014-02-08 MED ORDER — DEXAMETHASONE SODIUM PHOSPHATE 10 MG/ML IJ SOLN
INTRAMUSCULAR | Status: AC
  Filled 2014-02-08: qty 1

## 2014-02-08 MED ORDER — SODIUM CHLORIDE 0.9 % IV SOLN
Freq: Once | INTRAVENOUS | Status: AC
  Administered 2014-02-08: 09:00:00 via INTRAVENOUS

## 2014-02-08 MED ORDER — SODIUM CHLORIDE 0.9 % IV SOLN
2400.0000 mg/m2 | INTRAVENOUS | Status: DC
  Administered 2014-02-08: 4150 mg via INTRAVENOUS
  Filled 2014-02-08: qty 83

## 2014-02-08 MED ORDER — SODIUM CHLORIDE 0.9 % IV SOLN
5.0000 mg/kg | Freq: Once | INTRAVENOUS | Status: AC
  Administered 2014-02-08: 375 mg via INTRAVENOUS
  Filled 2014-02-08: qty 15

## 2014-02-08 MED ORDER — LEUCOVORIN CALCIUM INJECTION 350 MG
405.0000 mg/m2 | Freq: Once | INTRAMUSCULAR | Status: AC
  Administered 2014-02-08: 700 mg via INTRAVENOUS
  Filled 2014-02-08: qty 35

## 2014-02-08 MED ORDER — ONDANSETRON 8 MG/NS 50 ML IVPB
INTRAVENOUS | Status: AC
  Filled 2014-02-08: qty 8

## 2014-02-08 MED ORDER — ONDANSETRON 8 MG/50ML IVPB (CHCC)
8.0000 mg | Freq: Once | INTRAVENOUS | Status: AC
  Administered 2014-02-08: 8 mg via INTRAVENOUS

## 2014-02-08 NOTE — Progress Notes (Signed)
At discharge, patient checked glucose, glucose was 40. Patient encouraged to drink orange juice with nourishments but initially refused. Eventually took orange juice and [place] sandwich with chips. This RN sat with patient waiting on ride. Mother instructed to ensure patient finishes her sandwich and chips. Both mother and patient voice understanding and to recheck glucose after eating.

## 2014-02-08 NOTE — Patient Instructions (Signed)
[facility] Discharge Instructions for Patients Receiving Chemotherapy  Today you received the following chemotherapy agents Avastin, Leucovorin and 5FU.  To help prevent nausea and vomiting after your treatment, we encourage you to take your nausea medication.   If you develop nausea and vomiting that is not controlled by your nausea medication, call the clinic.   BELOW ARE SYMPTOMS THAT SHOULD BE REPORTED IMMEDIATELY:  *FEVER GREATER THAN 100.5 F  *CHILLS WITH OR WITHOUT FEVER  NAUSEA AND VOMITING THAT IS NOT CONTROLLED WITH YOUR NAUSEA MEDICATION  *UNUSUAL SHORTNESS OF BREATH  *UNUSUAL BRUISING OR BLEEDING  TENDERNESS IN MOUTH AND THROAT WITH OR WITHOUT PRESENCE OF ULCERS  *URINARY PROBLEMS  *BOWEL PROBLEMS  UNUSUAL RASH Items with * indicate a potential emergency and should be followed up as soon as possible.  Feel free to call the clinic you have any questions or concerns. The clinic phone number is [phone].    

## 2014-02-08 NOTE — Progress Notes (Signed)
0900- Patient complains of bilateral lower extremity swelling yesterday. Patient states swelling is improved now and that she has been increasing her Lasix to 80mg  3 times a day for several days.   0905- Patient states she covered her GBS with 6 units regular insulin this am prior to arrival. Since her GBS is 444 upon arrival to the cancer center she covered herself with another 8 units.   1200- During infusion patient slept and did not have any food. Patient did get up once to use the restroom without difficulty.   1210- Upon discharge patient reports her CBG is 40---patient given beverage, sandwich and was eating candy. Patient was discharged home with her mother; instructed patient to eat and recheck her CBG upon arrival to her home. Patient and her mother verbalized understanding.

## 2014-02-08 NOTE — Progress Notes (Signed)
Ok to treat with ANC 1.1 per Dr. Gorsuch  

## 2014-02-08 NOTE — Progress Notes (Signed)
Post Avastin BP stable.

## 2014-02-10 ENCOUNTER — Ambulatory Visit (HOSPITAL_BASED_OUTPATIENT_CLINIC_OR_DEPARTMENT_OTHER): Payer: Medicaid Other

## 2014-02-10 VITALS — BP 120/75 | HR 74 | Temp 96.9°F

## 2014-02-10 DIAGNOSIS — C182 Malignant neoplasm of ascending colon: Secondary | ICD-10-CM

## 2014-02-10 DIAGNOSIS — Z452 Encounter for adjustment and management of vascular access device: Secondary | ICD-10-CM

## 2014-02-10 DIAGNOSIS — C189 Malignant neoplasm of colon, unspecified: Secondary | ICD-10-CM

## 2014-02-10 MED ORDER — HEPARIN SOD (PORK) LOCK FLUSH 100 UNIT/ML IV SOLN
500.0000 [IU] | Freq: Once | INTRAVENOUS | Status: AC | PRN
  Administered 2014-02-10: 500 [IU]
  Filled 2014-02-10: qty 5

## 2014-02-10 MED ORDER — SODIUM CHLORIDE 0.9 % IJ SOLN
10.0000 mL | INTRAMUSCULAR | Status: DC | PRN
  Administered 2014-02-10: 10 mL
  Filled 2014-02-10: qty 10

## 2014-02-20 ENCOUNTER — Encounter (HOSPITAL_COMMUNITY): Payer: Self-pay

## 2014-02-20 ENCOUNTER — Ambulatory Visit (HOSPITAL_COMMUNITY)
Admission: RE | Admit: 2014-02-20 | Discharge: 2014-02-20 | Disposition: A | Discharge status: Home / Self Care | Payer: Medicaid Other | Source: Ambulatory Visit | Attending: Hematology and Oncology | Admitting: Hematology and Oncology

## 2014-02-20 DIAGNOSIS — I700 Atherosclerosis of aorta: Secondary | ICD-10-CM | POA: Insufficient documentation

## 2014-02-20 DIAGNOSIS — R1013 Epigastric pain: Secondary | ICD-10-CM

## 2014-02-20 DIAGNOSIS — K3189 Other diseases of stomach and duodenum: Secondary | ICD-10-CM | POA: Insufficient documentation

## 2014-02-20 DIAGNOSIS — Z9049 Acquired absence of other specified parts of digestive tract: Secondary | ICD-10-CM | POA: Diagnosis not present

## 2014-02-20 DIAGNOSIS — K8689 Other specified diseases of pancreas: Secondary | ICD-10-CM | POA: Diagnosis not present

## 2014-02-20 DIAGNOSIS — C189 Malignant neoplasm of colon, unspecified: Secondary | ICD-10-CM | POA: Diagnosis present

## 2014-02-20 DIAGNOSIS — K429 Umbilical hernia without obstruction or gangrene: Secondary | ICD-10-CM | POA: Insufficient documentation

## 2014-02-20 DIAGNOSIS — C787 Secondary malignant neoplasm of liver and intrahepatic bile duct: Secondary | ICD-10-CM | POA: Diagnosis not present

## 2014-02-20 MED ORDER — IOHEXOL 300 MG/ML  SOLN
100.0000 mL | Freq: Once | INTRAMUSCULAR | Status: AC | PRN
  Administered 2014-02-20: 100 mL via INTRAVENOUS

## 2014-02-22 ENCOUNTER — Telehealth: Payer: Self-pay | Admitting: Hematology and Oncology

## 2014-02-22 ENCOUNTER — Ambulatory Visit (HOSPITAL_BASED_OUTPATIENT_CLINIC_OR_DEPARTMENT_OTHER): Payer: Medicaid Other | Admitting: Hematology and Oncology

## 2014-02-22 ENCOUNTER — Ambulatory Visit: Payer: Self-pay

## 2014-02-22 ENCOUNTER — Other Ambulatory Visit (HOSPITAL_BASED_OUTPATIENT_CLINIC_OR_DEPARTMENT_OTHER): Payer: Medicaid Other

## 2014-02-22 ENCOUNTER — Encounter: Payer: Self-pay | Admitting: Hematology and Oncology

## 2014-02-22 VITALS — BP 123/74 | HR 70 | Temp 98.2°F | Ht 65.0 in | Wt 170.6 lb

## 2014-02-22 DIAGNOSIS — D6959 Other secondary thrombocytopenia: Secondary | ICD-10-CM

## 2014-02-22 DIAGNOSIS — N183 Chronic kidney disease, stage 3 unspecified: Secondary | ICD-10-CM

## 2014-02-22 DIAGNOSIS — C182 Malignant neoplasm of ascending colon: Secondary | ICD-10-CM

## 2014-02-22 DIAGNOSIS — F17200 Nicotine dependence, unspecified, uncomplicated: Secondary | ICD-10-CM

## 2014-02-22 DIAGNOSIS — T50905A Adverse effect of unspecified drugs, medicaments and biological substances, initial encounter: Secondary | ICD-10-CM

## 2014-02-22 DIAGNOSIS — E1340 Other specified diabetes mellitus with diabetic neuropathy, unspecified: Secondary | ICD-10-CM

## 2014-02-22 DIAGNOSIS — E1349 Other specified diabetes mellitus with other diabetic neurological complication: Secondary | ICD-10-CM

## 2014-02-22 DIAGNOSIS — E1142 Type 2 diabetes mellitus with diabetic polyneuropathy: Secondary | ICD-10-CM

## 2014-02-22 DIAGNOSIS — C787 Secondary malignant neoplasm of liver and intrahepatic bile duct: Secondary | ICD-10-CM

## 2014-02-22 DIAGNOSIS — C189 Malignant neoplasm of colon, unspecified: Secondary | ICD-10-CM

## 2014-02-22 DIAGNOSIS — E109 Type 1 diabetes mellitus without complications: Secondary | ICD-10-CM

## 2014-02-22 LAB — CBC WITH DIFFERENTIAL/PLATELET
BASO%: 0.6 % (ref 0.0–2.0)
Basophils Absolute: 0 10*3/uL (ref 0.0–0.1)
EOS ABS: 0.1 10*3/uL (ref 0.0–0.5)
EOS%: 3.2 % (ref 0.0–7.0)
HCT: 41.7 % (ref 34.8–46.6)
HGB: 13.6 g/dL (ref 11.6–15.9)
LYMPH%: 33.5 % (ref 14.0–49.7)
MCH: 28.8 pg (ref 25.1–34.0)
MCHC: 32.6 g/dL (ref 31.5–36.0)
MCV: 88.3 fL (ref 79.5–101.0)
MONO#: 0.3 10*3/uL (ref 0.1–0.9)
MONO%: 7.7 % (ref 0.0–14.0)
NEUT#: 1.9 10*3/uL (ref 1.5–6.5)
NEUT%: 55 % (ref 38.4–76.8)
Platelets: 110 10*3/uL — ABNORMAL LOW (ref 145–400)
RBC: 4.72 10*6/uL (ref 3.70–5.45)
RDW: 14.8 % — AB (ref 11.2–14.5)
WBC: 3.5 10*3/uL — ABNORMAL LOW (ref 3.9–10.3)
lymph#: 1.2 10*3/uL (ref 0.9–3.3)

## 2014-02-22 LAB — COMPREHENSIVE METABOLIC PANEL (CC13)
ALT: 31 U/L (ref 0–55)
AST: 30 U/L (ref 5–34)
Albumin: 3.4 g/dL — ABNORMAL LOW (ref 3.5–5.0)
Alkaline Phosphatase: 159 U/L — ABNORMAL HIGH (ref 40–150)
Anion Gap: 12 mEq/L — ABNORMAL HIGH (ref 3–11)
BILIRUBIN TOTAL: 0.48 mg/dL (ref 0.20–1.20)
BUN: 16.3 mg/dL (ref 7.0–26.0)
CO2: 25 mEq/L (ref 22–29)
Calcium: 9.6 mg/dL (ref 8.4–10.4)
Chloride: 100 mEq/L (ref 98–109)
Creatinine: 1.5 mg/dL — ABNORMAL HIGH (ref 0.6–1.1)
GLUCOSE: 455 mg/dL — AB (ref 70–140)
POTASSIUM: 4.1 meq/L (ref 3.5–5.1)
SODIUM: 137 meq/L (ref 136–145)
Total Protein: 7.1 g/dL (ref 6.4–8.3)

## 2014-02-22 NOTE — Assessment & Plan Note (Signed)
I spent some time counseling the patient the importance of tobacco cessation. she is currently attempting to quit on her own 

## 2014-02-22 NOTE — Assessment & Plan Note (Signed)
We discussed about the risk and benefit of maintenance chemotherapy. The patient has a lot of comorbidities and I do not think it is unreasonable to give her a treatment break. She agreed. I will see her back in 3 months with history, physical examination, blood work and imaging study. I will order a port flush every 6 weeks to maintain port patency.

## 2014-02-22 NOTE — Progress Notes (Signed)
[facility] OFFICE PROGRESS NOTE  Patient Care Team: Elberta Leatherwood, MD as PCP - General Windy Kalata, MD as Consulting Physician (Nephrology) Leatrice Jewels Rayetta Pigg, MD as Consulting Physician (Psychiatry) Heath Lark, MD as Consulting Physician (Hematology and Oncology)  SUMMARY OF ONCOLOGIC HISTORY: Oncology History   Colon cancer, KRAS positive   Primary site: Colon and Rectum (Right)   Staging method: AJCC 7th Edition   Clinical: (T4b, N2b, M1) signed by Heath Lark, MD on 08/05/2013  9:52 AM   Pathologic: Stage IVB (T4b, N2b, M1b) signed by Heath Lark, MD on 08/05/2013 10:21 AM   Summary: Stage IVB (T4b, N2b, M1b)      Colon cancer   07/15/2013 - 07/25/2013 Hospital Admission The patient presented to the hospital with pain in her abdomen and underwent surgery for colon cancer   07/15/2013 Imaging CT scan of the abdomen show bowel dilatation suspicious for malignancy   07/17/2013 Tumor Marker Preoperative CEA was 2.2, normal   07/19/2013 Procedure Colonoscopy revealed ascending colon lesion with significant stricture and biopsy showed adenocarcinoma   07/20/2013 Surgery She underwent right hemicolectomy, cholecystectomy, liver biopsy, excision of abdominal wall mass and omental mass. During surgery, she was also found to have drop metastasis   08/29/2013 Imaging Repeat CT scan show persistent liver metastasis and new omental metastasis   08/31/2013 - 02/10/2014 Chemotherapy The patient was started on palliative chemotherapy with FOLFOX, bolus 5-FU committed and oxaliplatin dose reduced due to  peripheral neuropathy. She has completed 12 cycles of treatment.   11/22/2013 Imaging Repeat CT scan showed near complete response to treatment.   11/23/2013 - 02/10/2014 Chemotherapy Avastin was added to her chemotherapy regimen   12/21/2013 Adverse Reaction Oxaliplatin was omitted due to worsening peripheral neuropathy.   02/20/2014 Imaging Repeat CT scan showed near complete  resolution of all disease.    INTERVAL HISTORY: Please see below for problem oriented charting. She is seen today prior to cycle 13 of chemotherapy. The patient felt overall energy level is slightly improved with reduction of dose of oxaliplatin. However, her neuropathy is a little worse. She is having difficulties holding objects in her hand. Her blood sugar is poorly controlled. She continues to smoke.  REVIEW OF SYSTEMS:   Constitutional: Denies fevers, chills or abnormal weight loss Eyes: Denies blurriness of vision Ears, nose, mouth, throat, and face: Denies mucositis or sore throat Respiratory: Denies cough, dyspnea or wheezes Cardiovascular: Denies palpitation, chest discomfort or lower extremity swelling Gastrointestinal:  Denies nausea, heartburn or change in bowel habits Skin: Denies abnormal skin rashes Lymphatics: Denies new lymphadenopathy or easy bruising Behavioral/Psych: Mood is stable, no new changes  All other systems were reviewed with the patient and are negative.  I have reviewed the past medical history, past surgical history, social history and family history with the patient and they are unchanged from previous note.  ALLERGIES:  has No Known Allergies.  MEDICATIONS:  Current Outpatient Prescriptions  Medication Sig Dispense Refill  . ACCU-CHEK AVIVA PLUS test strip USE AS DIRECTED  100 each  10  . atorvastatin (LIPITOR) 40 MG tablet Take 40 mg by mouth daily.      . buprenorphine-naloxone (SUBOXONE) 8-2 MG SUBL SL tablet Place 1 tablet under the tongue 3 (three) times daily.      . clonazePAM (KLONOPIN) 1 MG tablet Take 1 mg by mouth 3 (three) times daily as needed for anxiety.       . diazepam (VALIUM) 10 MG tablet Take 10 mg  by mouth 3 (three) times daily.      . furosemide (LASIX) 40 MG tablet Take 1 tablet (40 mg total) by mouth 2 (two) times daily.  60 tablet  5  . gabapentin (NEURONTIN) 300 MG capsule Take 2 capsules (600 mg total) by mouth 2 (two)  times daily.  120 capsule  5  . insulin glargine (LANTUS) 100 UNIT/ML injection Inject 0.15 mLs (15 Units total) into the skin daily.  10 mL  0  . insulin lispro (HUMALOG) 100 UNIT/ML injection Inject 2-8 Units into the skin See admin instructions. Three times per day per sliding scale      . lamoTRIgine (LAMICTAL) 100 MG tablet Take 200 mg by mouth every morning.       . lidocaine-prilocaine (EMLA) cream Apply 1 application topically as needed (for pain).      Marland Kitchen omeprazole (PRILOSEC) 20 MG capsule Take 20 mg by mouth every morning.      . ondansetron (ZOFRAN) 8 MG tablet Take 1 tablet (8 mg total) by mouth every 8 (eight) hours as needed.  30 tablet  1  . polyethylene glycol (MIRALAX / GLYCOLAX) packet Take 17 g by mouth daily as needed for mild constipation.      . polyethylene glycol powder (GLYCOLAX/MIRALAX) powder USE AS DIRECTED  255 g  3  . prochlorperazine (COMPAZINE) 10 MG tablet Take 1 tablet (10 mg total) by mouth every 6 (six) hours as needed (Nausea or vomiting).  30 tablet  1  . ramelteon (ROZEREM) 8 MG tablet Take 8 mg by mouth at bedtime.      . traMADol (ULTRAM) 50 MG tablet Take 2 tablets (100 mg total) by mouth every 6 (six) hours as needed.  60 tablet  0  . ziprasidone (GEODON) 80 MG capsule Take 160 mg by mouth at bedtime.        No current facility-administered medications for this visit.    PHYSICAL EXAMINATION: ECOG PERFORMANCE STATUS: 1 - Symptomatic but completely ambulatory  Filed Vitals:   02/22/14 0906  BP: 123/74  Pulse: 70  Temp: 98.2 F (36.8 C)   Filed Weights   02/22/14 0906  Weight: 170 lb 9.6 oz (77.384 kg)    GENERAL:alert, no distress and comfortable SKIN: skin color, texture, turgor are normal, no rashes or significant lesions EYES: normal, Conjunctiva are pink and non-injected, sclera clear OROPHARYNX:no exudate, no erythema and lips, buccal mucosa, and tongue normal  Musculoskeletal:no cyanosis of digits and no clubbing  NEURO: alert &  oriented x 3 with fluent speech, no focal motor/sensory deficits  LABORATORY DATA:  I have reviewed the data as listed    Component Value Date/Time   NA 137 02/22/2014 0848   NA 137 01/12/2014 0527   K 4.1 02/22/2014 0848   K 3.8 01/12/2014 0527   CL 97 01/12/2014 0527   CO2 25 02/22/2014 0848   CO2 27 01/12/2014 0527   GLUCOSE 455* 02/22/2014 0848   GLUCOSE 173* 01/12/2014 0527   BUN 16.3 02/22/2014 0848   BUN 12 01/12/2014 0527   CREATININE 1.5* 02/22/2014 0848   CREATININE 1.10 01/12/2014 0527   CREATININE 1.38* 05/26/2013 1703   CALCIUM 9.6 02/22/2014 0848   CALCIUM 8.9 01/12/2014 0527   PROT 7.1 02/22/2014 0848   PROT 7.1 01/11/2014 2005   ALBUMIN 3.4* 02/22/2014 0848   ALBUMIN 3.4* 01/11/2014 2005   AST 30 02/22/2014 0848   AST 31 01/11/2014 2005   ALT 31 02/22/2014 0848   ALT 28  01/11/2014 2005   ALKPHOS 159* 02/22/2014 0848   ALKPHOS 137* 01/11/2014 2005   BILITOT 0.48 02/22/2014 0848   BILITOT 0.3 01/11/2014 2005   GFRNONAA 57* 01/12/2014 0527   GFRNONAA 44* 05/26/2013 1703   GFRAA 66* 01/12/2014 0527   GFRAA 51* 05/26/2013 1703    No results found for this basename: SPEP, UPEP,  kappa and lambda light chains    Lab Results  Component Value Date   WBC 3.5* 02/22/2014   NEUTROABS 1.9 02/22/2014   HGB 13.6 02/22/2014   HCT 41.7 02/22/2014   MCV 88.3 02/22/2014   PLT 110* 02/22/2014      Chemistry      Component Value Date/Time   NA 137 02/22/2014 0848   NA 137 01/12/2014 0527   K 4.1 02/22/2014 0848   K 3.8 01/12/2014 0527   CL 97 01/12/2014 0527   CO2 25 02/22/2014 0848   CO2 27 01/12/2014 0527   BUN 16.3 02/22/2014 0848   BUN 12 01/12/2014 0527   CREATININE 1.5* 02/22/2014 0848   CREATININE 1.10 01/12/2014 0527   CREATININE 1.38* 05/26/2013 1703      Component Value Date/Time   CALCIUM 9.6 02/22/2014 0848   CALCIUM 8.9 01/12/2014 0527   ALKPHOS 159* 02/22/2014 0848   ALKPHOS 137* 01/11/2014 2005   AST 30 02/22/2014 0848   AST 31 01/11/2014 2005   ALT 31 02/22/2014 0848   ALT 28  01/11/2014 2005   BILITOT 0.48 02/22/2014 0848   BILITOT 0.3 01/11/2014 2005       RADIOGRAPHIC STUDIES:  I have personally reviewed the radiological images as listed and agreed with the findings in the report. Ct Chest W Contrast  02/20/2014   CLINICAL DATA:  Colon cancer.  EXAM: CT CHEST, ABDOMEN, AND PELVIS WITH CONTRAST  TECHNIQUE: Multidetector CT imaging of the chest, abdomen and pelvis was performed following the standard protocol during bolus administration of intravenous contrast.  CONTRAST:  16m OMNIPAQUE IOHEXOL 300 MG/ML  SOLN  COMPARISON:  11/22/2013 and 08/29/2013.  FINDINGS: CT CHEST FINDINGS  Soft tissue / Mediastinum: The tip of the left-sided Port-A-Cath is positioned at the junction of the SVC and RA. No axillary, mediastinal, or hilar lymphadenopathy. Heart size is normal. No pericardial or pleural effusion.  Lungs / Pleura: No pulmonary parenchymal nodule or mass. No focal airspace consolidation or pulmonary edema. No pleural effusion.  Bones: Bone windows reveal no worrisome lytic or sclerotic osseous lesions.  CT ABDOMEN AND PELVIS FINDINGS  Liver: Previously measured right hepatic metastatic lesion has decreased in is almost imperceptible on today's study. The abnormal area measures 7 x 6 mm today compared to 12 x 12 mm previously.  Spleen: No splenomegaly. No focal mass lesion.  Stomach: Mild gastric distension without evidence for gastric wall thickening.  Pancreas: Pancreas is atrophic without dilatation of the main duct. No focal mass lesion.  Gallbladder/Biliary: Gallbladder is surgically absent. Trace prominence of the intrahepatic bile ducts is unchanged.  Kidneys/Adrenals: No adrenal nodule or mass. No enhancing renal mass. No hydronephrosis.  Bowel Loops: No small bowel obstruction. Patient is status post right hemicolectomy. Large stool volume noted throughout the length of the colon.  Nodes: No lymphadenopathy in the abdomen. No pelvic sidewall lymphadenopathy.   Vasculature: Atherosclerotic calcification is noted in the wall of the abdominal aorta without aneurysm.  Pelvic Genitourinary: Bladder is unremarkable. Probable fibroid change in the uterine fundus. No adnexal mass.  Bones/Musculoskeletal: Bone windows reveal no worrisome lytic or sclerotic osseous lesions.  Body Wall: Tiny umbilical hernia contains only fat  Other: No intraperitoneal free fluid.  IMPRESSION: Continued further decrease in the right hepatic lesion which is almost imperceptible on today's study. No evidence for omental or peritoneal disease on the current exam. The metastatic disease seen in the pelvis on the 08/29/2013 exam shows no residual abnormality currently.   Electronically Signed   By: Misty Stanley M.D.   On: 02/20/2014 12:56   Ct Abdomen Pelvis W Contrast  02/20/2014   CLINICAL DATA:  Colon cancer.  EXAM: CT CHEST, ABDOMEN, AND PELVIS WITH CONTRAST  TECHNIQUE: Multidetector CT imaging of the chest, abdomen and pelvis was performed following the standard protocol during bolus administration of intravenous contrast.  CONTRAST:  145m OMNIPAQUE IOHEXOL 300 MG/ML  SOLN  COMPARISON:  11/22/2013 and 08/29/2013.  FINDINGS: CT CHEST FINDINGS  Soft tissue / Mediastinum: The tip of the left-sided Port-A-Cath is positioned at the junction of the SVC and RA. No axillary, mediastinal, or hilar lymphadenopathy. Heart size is normal. No pericardial or pleural effusion.  Lungs / Pleura: No pulmonary parenchymal nodule or mass. No focal airspace consolidation or pulmonary edema. No pleural effusion.  Bones: Bone windows reveal no worrisome lytic or sclerotic osseous lesions.  CT ABDOMEN AND PELVIS FINDINGS  Liver: Previously measured right hepatic metastatic lesion has decreased in is almost imperceptible on today's study. The abnormal area measures 7 x 6 mm today compared to 12 x 12 mm previously.  Spleen: No splenomegaly. No focal mass lesion.  Stomach: Mild gastric distension without evidence for  gastric wall thickening.  Pancreas: Pancreas is atrophic without dilatation of the main duct. No focal mass lesion.  Gallbladder/Biliary: Gallbladder is surgically absent. Trace prominence of the intrahepatic bile ducts is unchanged.  Kidneys/Adrenals: No adrenal nodule or mass. No enhancing renal mass. No hydronephrosis.  Bowel Loops: No small bowel obstruction. Patient is status post right hemicolectomy. Large stool volume noted throughout the length of the colon.  Nodes: No lymphadenopathy in the abdomen. No pelvic sidewall lymphadenopathy.  Vasculature: Atherosclerotic calcification is noted in the wall of the abdominal aorta without aneurysm.  Pelvic Genitourinary: Bladder is unremarkable. Probable fibroid change in the uterine fundus. No adnexal mass.  Bones/Musculoskeletal: Bone windows reveal no worrisome lytic or sclerotic osseous lesions.  Body Wall: Tiny umbilical hernia contains only fat  Other: No intraperitoneal free fluid.  IMPRESSION: Continued further decrease in the right hepatic lesion which is almost imperceptible on today's study. No evidence for omental or peritoneal disease on the current exam. The metastatic disease seen in the pelvis on the 08/29/2013 exam shows no residual abnormality currently.   Electronically Signed   By: EMisty StanleyM.D.   On: 02/20/2014 12:56     ASSESSMENT & PLAN:  Colon cancer We discussed about the risk and benefit of maintenance chemotherapy. The patient has a lot of comorbidities and I do not think it is unreasonable to give her a treatment break. She agreed. I will see her back in 3 months with history, physical examination, blood work and imaging study. I will order a port flush every 6 weeks to maintain port patency.  TOBACCO ABUSE I spent some time counseling the patient the importance of tobacco cessation. she is currently attempting to quit on her own  Thrombocytopenia due to drugs This is likely due to recent treatment. The patient denies  recent history of bleeding such as epistaxis, hematuria or hematochezia. She is asymptomatic from the low platelet count. I will observe for  now.   Neuropathy due to secondary diabetes This is improving since discontinuation of oxaliplatin. Hopefully will continue to improve in the future.  DIABETES MELLITUS, TYPE I, ADULT ONSET I would defer to her primary care physician for management of diabetes.  CKD (chronic kidney disease), stage III Her kidney function tests is unaffected by recent chemotherapy. Continue close observation.    Orders Placed This Encounter  Procedures  . CT Abdomen Pelvis W Contrast    Standing Status: Future     Number of Occurrences:      Standing Expiration Date: 05/25/2015    Order Specific Question:  Reason for Exam (SYMPTOM  OR DIAGNOSIS REQUIRED)    Answer:  staging colon cancer to liver    Order Specific Question:  Is the patient pregnant?    Answer:  No    Order Specific Question:  Preferred imaging location?    Answer:  [facility]   All questions were answered. The patient knows to call the clinic with any problems, questions or concerns. No barriers to learning was detected. I spent 25 minutes counseling the patient face to face. The total time spent in the appointment was 30 minutes and more than 50% was on counseling and review of test results     [facility], [place], MD 02/22/2014 9:37 AM

## 2014-02-22 NOTE — Assessment & Plan Note (Signed)
I would defer to her primary care physician for management of diabetes.

## 2014-02-22 NOTE — Assessment & Plan Note (Signed)
This is improving since discontinuation of oxaliplatin. Hopefully will continue to improve in the future.

## 2014-02-22 NOTE — Assessment & Plan Note (Signed)
This is likely due to recent treatment. The patient denies recent history of bleeding such as epistaxis, hematuria or hematochezia. She is asymptomatic from the low platelet count. I will observe for now.  

## 2014-02-22 NOTE — Assessment & Plan Note (Signed)
Her kidney function tests is unaffected by recent chemotherapy. Continue close observation.   

## 2014-02-22 NOTE — Telephone Encounter (Signed)
gv and printed appts ched and avs for pt for July/Aug and OCT....gv pt barium

## 2014-03-21 ENCOUNTER — Telehealth: Payer: Self-pay | Admitting: *Deleted

## 2014-03-21 ENCOUNTER — Other Ambulatory Visit: Payer: Self-pay | Admitting: Hematology and Oncology

## 2014-03-21 DIAGNOSIS — C189 Malignant neoplasm of colon, unspecified: Secondary | ICD-10-CM

## 2014-03-21 NOTE — Telephone Encounter (Signed)
Pt states she just had a BM and thinks she will be ok now.  Instructed her to continue daily Miralax and take stool softener twice daily.  Add senokot if no BM more than 2 days.  She verbalized understanding.

## 2014-03-21 NOTE — Telephone Encounter (Signed)
Recommend add sennokot 2 tabs TID and dulcolax suppository daily. I will order plain XRAY today

## 2014-03-21 NOTE — Telephone Encounter (Signed)
Pt reports Constipation x 1 week.  She has been taking miralax daily and has added one stool softener daily for the past 3 days.  Pt worries she is obstructed.  She denies n/v and states she is passing gas.

## 2014-03-22 ENCOUNTER — Other Ambulatory Visit: Payer: Self-pay | Admitting: Hematology and Oncology

## 2014-03-24 ENCOUNTER — Encounter: Payer: Self-pay | Admitting: Family Medicine

## 2014-04-03 ENCOUNTER — Ambulatory Visit (HOSPITAL_BASED_OUTPATIENT_CLINIC_OR_DEPARTMENT_OTHER): Payer: Medicaid Other

## 2014-04-03 VITALS — BP 131/60 | HR 70

## 2014-04-03 DIAGNOSIS — C787 Secondary malignant neoplasm of liver and intrahepatic bile duct: Secondary | ICD-10-CM

## 2014-04-03 DIAGNOSIS — Z95828 Presence of other vascular implants and grafts: Secondary | ICD-10-CM

## 2014-04-03 DIAGNOSIS — Z452 Encounter for adjustment and management of vascular access device: Secondary | ICD-10-CM

## 2014-04-03 DIAGNOSIS — C182 Malignant neoplasm of ascending colon: Secondary | ICD-10-CM

## 2014-04-03 MED ORDER — HEPARIN SOD (PORK) LOCK FLUSH 100 UNIT/ML IV SOLN
500.0000 [IU] | Freq: Once | INTRAVENOUS | Status: AC
  Administered 2014-04-03: 500 [IU] via INTRAVENOUS
  Filled 2014-04-03: qty 5

## 2014-04-03 MED ORDER — SODIUM CHLORIDE 0.9 % IJ SOLN
10.0000 mL | INTRAMUSCULAR | Status: DC | PRN
  Administered 2014-04-03: 10 mL via INTRAVENOUS
  Filled 2014-04-03: qty 10

## 2014-04-03 NOTE — Patient Instructions (Signed)

## 2014-04-05 ENCOUNTER — Encounter: Payer: Self-pay | Admitting: Family Medicine

## 2014-04-05 ENCOUNTER — Ambulatory Visit (INDEPENDENT_AMBULATORY_CARE_PROVIDER_SITE_OTHER): Payer: Medicaid Other | Admitting: Family Medicine

## 2014-04-05 VITALS — BP 129/91 | HR 86 | Temp 97.9°F | Ht 65.0 in | Wt 167.3 lb

## 2014-04-05 DIAGNOSIS — E109 Type 1 diabetes mellitus without complications: Secondary | ICD-10-CM

## 2014-04-05 DIAGNOSIS — E1349 Other specified diabetes mellitus with other diabetic neurological complication: Secondary | ICD-10-CM

## 2014-04-05 DIAGNOSIS — E1340 Other specified diabetes mellitus with diabetic neuropathy, unspecified: Secondary | ICD-10-CM

## 2014-04-05 DIAGNOSIS — E1142 Type 2 diabetes mellitus with diabetic polyneuropathy: Secondary | ICD-10-CM

## 2014-04-05 DIAGNOSIS — E1149 Type 2 diabetes mellitus with other diabetic neurological complication: Secondary | ICD-10-CM

## 2014-04-05 DIAGNOSIS — K5900 Constipation, unspecified: Secondary | ICD-10-CM

## 2014-04-05 LAB — POCT GLYCOSYLATED HEMOGLOBIN (HGB A1C): HEMOGLOBIN A1C: 7.5

## 2014-04-05 MED ORDER — POLYETHYLENE GLYCOL 3350 17 GM/SCOOP PO POWD: 17.0000 g | Freq: Two times a day (BID) | ORAL | Status: AC | PRN

## 2014-04-05 NOTE — Assessment & Plan Note (Signed)
Currently well-controlled on Neurontin 600 mg twice a day. No new or developing complaints about this issue.

## 2014-04-05 NOTE — Assessment & Plan Note (Addendum)
Patient states that she's not entirely aware of how her diabetes has been. She believes that his been okay though. - A1c was obtained in the office today and was 7.5. - No change to current insulin regimen was made at this time. - I discussed with patient that I would like to see her back in 3-4 months in order to further go over her current health status and medication regimen.

## 2014-04-05 NOTE — Patient Instructions (Signed)
It was a please seeing you today in our clinic. Today we discussed your diabetes, current chemotherapy, depression. Here is the treatment plan we have discussed and agreed upon together:  - I have ordered an A1c to be taken. I would like to ensure that your diabetes is currently well managed and under control. - We discussed some of the issues he been having with your chemotherapy and a lack of energy you've had recently. - Regarding the constipation you've been experiencing I recommend continuing your current laxative/stool softener regimen. - Do your best to see the positive parts of each day. Not every day as great, but every day has something to be thankful for. - I like to see you back in 3-4 months. At which time we may get a Pap smear.

## 2014-04-05 NOTE — Assessment & Plan Note (Signed)
Reorder prescription for MiraLAX. Patient has been experiencing significant episodes of constipation. She also states that she has experienced some upset stomach/nausea. When asked whether or not she thought this nausea was secondary to constipation she was unsure however she thought this could be the case. I encouraged her to continue current stool softening/laxative regimen. She was okay with this recommendation and she stated understanding that she has to contact us if any symptoms worsen.

## 2014-04-05 NOTE — Progress Notes (Signed)
Patient ID: Monica [name], female   DOB: 04/04/1961, 53 y.o.   MRN: [id number]  Georges Lynch, MD, MS Phone: [phone]  Subjective:  Chief complaint: Meeting new PCP  Pt Here for meeting new PCP. Patient is very pleasant 53 year old female with a history significant for colon cancer with metastasis to the liver. Patient states that she has been feeling well. She is currently on a vacation from chemotherapy. She is being followed by oncology and nephropathy for her colon cancer treatment and chronic kidney disease respectively. She states she has been experiencing some constipation recently. No frank bloody or black stools. She has been treating this well with laxatives and stool softeners. No additional issues at this time.  We discussed her current glycemic control. She states that she believes that she is been fairly well controlled but was not sure. I believe that patient has not been keeping track of her glucose levels. Because of this I have ordered an A1c to be taken in the office (7.5).  Review of Systems  Constitutional: Positive for malaise/fatigue. Negative for fever and chills.  Eyes: Negative for blurred vision.  Respiratory: Negative for cough and shortness of breath.   Cardiovascular: Negative for chest pain, palpitations and leg swelling.  Gastrointestinal: Positive for nausea and constipation. Negative for vomiting, diarrhea, blood in stool and melena.  Musculoskeletal: Negative for back pain.  Neurological: Negative for headaches.     Past Medical History Patient Active Problem List   Diagnosis Date Noted  . Metastasis to liver 01/18/2014  . Hypoglycemia 01/11/2014  . Neuropathy due to secondary diabetes 10/12/2013  . Leukopenia due to antineoplastic chemotherapy 09/28/2013  . Thrombocytopenia due to drugs 09/28/2013  . Dysmenorrhea 09/05/2013  . Colon cancer 07/22/2013  . Colonic mass 07/16/2013  . Constipation 05/26/2013  . Severe diabetic hypoglycemia  12/17/2012  . Polypharmacy 12/17/2012  . 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 Bilateral knee pain 09/09/2012  . Opioid dependence 09/21/2011  . Benzodiazepine abuse 09/21/2011  . PERIMENOPAUSAL STATUS 10/11/2010  . HYPERLIPIDEMIA 08/20/2010  . LYMPHADENOPATHY 08/20/2010  . Overweight (BMI 25.0-29.9) 06/22/2009  . CKD (chronic kidney disease), stage III 03/30/2009  . Edema 08/15/2008  . DIABETES MELLITUS, TYPE I, ADULT ONSET 04/07/2008  . 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 HEPATITIS C 04/04/2008  . ANEMIA, CHRONIC 04/04/2008  . BIPOLAR AFFECTIVE DISORDER 04/04/2008  . TOBACCO ABUSE 04/04/2008  . SUBSTANCE ABUSE, MULTIPLE 04/04/2008  . HYPERTENSION, BENIGN ESSENTIAL 04/04/2008    Medications- reviewed and updated Current Outpatient Prescriptions  Medication Sig Dispense Refill  . ACCU-CHEK AVIVA PLUS test strip USE AS DIRECTED  100 each  10  . atorvastatin (LIPITOR) 40 MG tablet Take 40 mg by mouth daily.      . buprenorphine-naloxone (SUBOXONE) 8-2 MG SUBL SL tablet Place 1 tablet under the tongue 3 (three) times daily.      . clonazePAM (KLONOPIN) 1 MG tablet Take 1 mg by mouth 3 (three) times daily as needed for anxiety.       . diazepam (VALIUM) 10 MG tablet Take 10 mg by mouth 3 (three) times daily.      . furosemide (LASIX) 40 MG tablet Take 1 tablet (40 mg total) by mouth 2 (two) times daily.  60 tablet  5  . gabapentin (NEURONTIN) 300 MG capsule Take 2 capsules (600 mg total) by mouth 2 (two) times daily.  120 capsule  5  . insulin glargine (LANTUS) 100 UNIT/ML injection Inject 0.15 mLs (15 Units total) into the skin daily.  10 mL  0  . insulin lispro (HUMALOG) 100 UNIT/ML injection Inject 2-8 Units into the skin See admin instructions. Three times per day per sliding scale      . lamoTRIgine (LAMICTAL) 100 MG tablet Take 200 mg by mouth every morning.       . lidocaine-prilocaine (EMLA) cream Apply 1 application topically as needed (for pain).      Marland Kitchen omeprazole (PRILOSEC) 20 MG capsule  Take 20 mg by mouth every morning.      . ondansetron (ZOFRAN) 8 MG tablet Take 1 tablet (8 mg total) by mouth every 8 (eight) hours as needed.  30 tablet  1  . polyethylene glycol powder (GLYCOLAX/MIRALAX) powder Take 17 g by mouth 2 (two) times daily as needed.  3350 g  3  . prochlorperazine (COMPAZINE) 10 MG tablet Take 1 tablet (10 mg total) by mouth every 6 (six) hours as needed (Nausea or vomiting).  30 tablet  1  . ramelteon (ROZEREM) 8 MG tablet Take 8 mg by mouth at bedtime.      . traMADol (ULTRAM) 50 MG tablet Take 2 tablets (100 mg total) by mouth every 6 (six) hours as needed.  60 tablet  0  . ziprasidone (GEODON) 80 MG capsule Take 160 mg by mouth at bedtime.        No current facility-administered medications for this visit.    Objective: BP 129/91  Pulse 86  Temp(Src) 97.9 F (36.6 C) (Oral)  Ht 5\' 5"  (1.651 m)  Wt 167 lb 4.8 oz (75.887 kg)  BMI 27.84 kg/m2 Gen: NAD, alert, cooperative with exam; relatively flat affect HEENT: NCAT, EOMI, PERRL CV: RRR, no murmur Resp: CTABL, no wheezes Abd: Soft, Non Tender, Non Distended, BS present Ext: No edema, warm Neuro: Alert and oriented, No gross deficits   Assessment/Plan:  DIABETES MELLITUS, TYPE I, ADULT ONSET Patient states that she's not entirely aware of how her diabetes has been. She believes that his been okay though. - A1c was obtained in the office today. - No change to current insulin regimen was made at this time. - I discussed with patient that I would like to see her back in 3-4 months in order to further go over her current health status and medication regimen.  Neuropathy due to secondary diabetes Currently well-controlled on Neurontin 600 mg twice a day. No new or developing complaints about this issue.  Constipation Reorder prescription for MiraLAX. Patient has been experiencing significant episodes of constipation. She also states that she has experienced some upset stomach/nausea. When asked whether  or not she thought this nausea was secondary to constipation she was unsure however she thought this could be the case. I encouraged her to continue current stool softening/laxative regimen. She was okay with this recommendation and she stated understanding that she has to contact us if any symptoms worsen.   Orders Placed This Encounter  Procedures  . POCT A1C    Meds ordered this encounter  Medications  . polyethylene glycol powder (GLYCOLAX/MIRALAX) powder    Sig: Take 17 g by mouth 2 (two) times daily as needed.    Dispense:  3350 g    Refill:  3    Please dispense 26.9oz (or larger) bottle for patient, if possible.

## 2014-04-18 ENCOUNTER — Other Ambulatory Visit: Payer: Self-pay | Admitting: *Deleted

## 2014-04-18 MED ORDER — INSULIN LISPRO 100 UNIT/ML ~~LOC~~ SOLN: 2.0000 [IU] | SUBCUTANEOUS | Status: DC

## 2014-04-18 MED ORDER — GABAPENTIN 300 MG PO CAPS: 600.0000 mg | ORAL_CAPSULE | Freq: Two times a day (BID) | ORAL | Status: DC

## 2014-04-19 ENCOUNTER — Other Ambulatory Visit: Payer: Self-pay | Admitting: *Deleted

## 2014-04-19 NOTE — Telephone Encounter (Signed)
I refilled this script yesterday. Please inform patient this should be waiting for her.

## 2014-04-20 ENCOUNTER — Encounter: Payer: Self-pay | Admitting: Hematology and Oncology

## 2014-04-20 ENCOUNTER — Telehealth: Payer: Self-pay | Admitting: *Deleted

## 2014-04-20 ENCOUNTER — Ambulatory Visit (HOSPITAL_COMMUNITY)
Admission: RE | Admit: 2014-04-20 | Discharge: 2014-04-20 | Disposition: A | Discharge status: Home / Self Care | Payer: Medicaid Other | Source: Ambulatory Visit | Attending: Hematology and Oncology | Admitting: Hematology and Oncology

## 2014-04-20 ENCOUNTER — Ambulatory Visit (HOSPITAL_BASED_OUTPATIENT_CLINIC_OR_DEPARTMENT_OTHER): Payer: Medicaid Other

## 2014-04-20 ENCOUNTER — Ambulatory Visit (HOSPITAL_BASED_OUTPATIENT_CLINIC_OR_DEPARTMENT_OTHER): Payer: Medicaid Other | Admitting: Hematology and Oncology

## 2014-04-20 ENCOUNTER — Other Ambulatory Visit: Payer: Self-pay | Admitting: Hematology and Oncology

## 2014-04-20 VITALS — BP 111/86 | HR 81 | Temp 98.5°F | Resp 17 | Ht 65.0 in | Wt 165.2 lb

## 2014-04-20 DIAGNOSIS — C189 Malignant neoplasm of colon, unspecified: Secondary | ICD-10-CM | POA: Insufficient documentation

## 2014-04-20 DIAGNOSIS — R109 Unspecified abdominal pain: Secondary | ICD-10-CM | POA: Diagnosis present

## 2014-04-20 DIAGNOSIS — K5909 Other constipation: Secondary | ICD-10-CM | POA: Insufficient documentation

## 2014-04-20 DIAGNOSIS — Z85038 Personal history of other malignant neoplasm of large intestine: Secondary | ICD-10-CM

## 2014-04-20 DIAGNOSIS — K5900 Constipation, unspecified: Secondary | ICD-10-CM | POA: Insufficient documentation

## 2014-04-20 LAB — CBC WITH DIFFERENTIAL/PLATELET
BASO%: 0.5 % (ref 0.0–2.0)
BASOS ABS: 0 10*3/uL (ref 0.0–0.1)
EOS ABS: 0.1 10*3/uL (ref 0.0–0.5)
EOS%: 2 % (ref 0.0–7.0)
HEMATOCRIT: 43.6 % (ref 34.8–46.6)
HGB: 14.5 g/dL (ref 11.6–15.9)
LYMPH%: 25.3 % (ref 14.0–49.7)
MCH: 28.9 pg (ref 25.1–34.0)
MCHC: 33.3 g/dL (ref 31.5–36.0)
MCV: 87 fL (ref 79.5–101.0)
MONO#: 0.4 10*3/uL (ref 0.1–0.9)
MONO%: 6.8 % (ref 0.0–14.0)
NEUT%: 65.4 % (ref 38.4–76.8)
NEUTROS ABS: 3.7 10*3/uL (ref 1.5–6.5)
Platelets: 182 10*3/uL (ref 145–400)
RBC: 5.01 10*6/uL (ref 3.70–5.45)
RDW: 13 % (ref 11.2–14.5)
WBC: 5.6 10*3/uL (ref 3.9–10.3)
lymph#: 1.4 10*3/uL (ref 0.9–3.3)

## 2014-04-20 LAB — COMPREHENSIVE METABOLIC PANEL (CC13)
ALT: 24 U/L (ref 0–55)
ANION GAP: 14 meq/L — AB (ref 3–11)
AST: 29 U/L (ref 5–34)
Albumin: 3.6 g/dL (ref 3.5–5.0)
Alkaline Phosphatase: 159 U/L — ABNORMAL HIGH (ref 40–150)
BUN: 10.4 mg/dL (ref 7.0–26.0)
CALCIUM: 10.1 mg/dL (ref 8.4–10.4)
CHLORIDE: 103 meq/L (ref 98–109)
CO2: 31 meq/L — AB (ref 22–29)
Creatinine: 1.3 mg/dL — ABNORMAL HIGH (ref 0.6–1.1)
Glucose: 52 mg/dl — ABNORMAL LOW (ref 70–140)
POTASSIUM: 3.9 meq/L (ref 3.5–5.1)
Sodium: 147 mEq/L — ABNORMAL HIGH (ref 136–145)
Total Bilirubin: 0.45 mg/dL (ref 0.20–1.20)
Total Protein: 8.3 g/dL (ref 6.4–8.3)

## 2014-04-20 LAB — UA PROTEIN, DIPSTICK - CHCC

## 2014-04-20 MED ORDER — BISACODYL 10 MG RE SUPP: 10.0000 mg | RECTAL | Status: DC | PRN

## 2014-04-20 MED ORDER — SENNOSIDES-DOCUSATE SODIUM 8.6-50 MG PO TABS: 1.0000 | ORAL_TABLET | Freq: Two times a day (BID) | ORAL | Status: AC

## 2014-04-20 NOTE — Assessment & Plan Note (Signed)
Her abdominal pain is likely due to severe constipation. Clinical examination is benign. Her blood work is unremarkable. Abdominal x-ray was reviewed which showed no evidence of bowel obstruction. I recommend additional laxatives to resolve this.

## 2014-04-20 NOTE — Progress Notes (Signed)
[facility] OFFICE PROGRESS NOTE  Patient Care Team: Elberta Leatherwood, MD as PCP - General Windy Kalata, MD as Consulting Physician (Nephrology) Leatrice Jewels Rayetta Pigg, MD as Consulting Physician (Psychiatry) Heath Lark, MD as Consulting Physician (Hematology and Oncology)  SUMMARY OF ONCOLOGIC HISTORY: Oncology History   Colon cancer, KRAS positive   Primary site: Colon and Rectum (Right)   Staging method: AJCC 7th Edition   Clinical: (T4b, N2b, M1) signed by Heath Lark, MD on 08/05/2013  9:52 AM   Pathologic: Stage IVB (T4b, N2b, M1b) signed by Heath Lark, MD on 08/05/2013 10:21 AM   Summary: Stage IVB (T4b, N2b, M1b)      Colon cancer   07/15/2013 - 07/25/2013 Hospital Admission The patient presented to the hospital with pain in her abdomen and underwent surgery for colon cancer   07/15/2013 Imaging CT scan of the abdomen show bowel dilatation suspicious for malignancy   07/17/2013 Tumor Marker Preoperative CEA was 2.2, normal   07/19/2013 Procedure Colonoscopy revealed ascending colon lesion with significant stricture and biopsy showed adenocarcinoma   07/20/2013 Surgery She underwent right hemicolectomy, cholecystectomy, liver biopsy, excision of abdominal wall mass and omental mass. During surgery, she was also found to have drop metastasis   08/29/2013 Imaging Repeat CT scan show persistent liver metastasis and new omental metastasis   08/31/2013 - 02/10/2014 Chemotherapy The patient was started on palliative chemotherapy with FOLFOX, bolus 5-FU committed and oxaliplatin dose reduced due to  peripheral neuropathy. She has completed 12 cycles of treatment.   11/22/2013 Imaging Repeat CT scan showed near complete response to treatment.   11/23/2013 - 02/10/2014 Chemotherapy Avastin was added to her chemotherapy regimen   12/21/2013 Adverse Reaction Oxaliplatin was omitted due to worsening peripheral neuropathy.   02/20/2014 Imaging Repeat CT scan showed near complete  resolution of all disease.    INTERVAL HISTORY: Please see below for problem oriented charting. She requested urgent evaluation today due to intermittent abdominal pain. She denies any nausea or vomiting. Her pain is localized around the periumbilical region, worse after eating. She told my nurse that she was not constipated. When I asked her, she states she had no bowel movement for 3 days.  REVIEW OF SYSTEMS:   Constitutional: Denies fevers, chills or abnormal weight loss Eyes: Denies blurriness of vision Ears, nose, mouth, throat, and face: Denies mucositis or sore throat Respiratory: Denies cough, dyspnea or wheezes Cardiovascular: Denies palpitation, chest discomfort or lower extremity swelling Skin: Denies abnormal skin rashes Lymphatics: Denies new lymphadenopathy or easy bruising Neurological:Denies numbness, tingling or new weaknesses Behavioral/Psych: Mood is stable, no new changes  All other systems were reviewed with the patient and are negative.  I have reviewed the past medical history, past surgical history, social history and family history with the patient and they are unchanged from previous note.  ALLERGIES:  has No Known Allergies.  MEDICATIONS:  Current Outpatient Prescriptions  Medication Sig Dispense Refill  . ACCU-CHEK AVIVA PLUS test strip USE AS DIRECTED  100 each  10  . atorvastatin (LIPITOR) 40 MG tablet Take 40 mg by mouth daily.      . buprenorphine-naloxone (SUBOXONE) 8-2 MG SUBL SL tablet Place 1 tablet under the tongue 3 (three) times daily.      . clonazePAM (KLONOPIN) 1 MG tablet Take 1 mg by mouth 3 (three) times daily as needed for anxiety.       . diazepam (VALIUM) 10 MG tablet Take 10 mg by mouth 3 (three) times  daily.      . furosemide (LASIX) 40 MG tablet Take 1 tablet (40 mg total) by mouth 2 (two) times daily.  60 tablet  5  . gabapentin (NEURONTIN) 300 MG capsule Take 2 capsules (600 mg total) by mouth 2 (two) times daily.  120 capsule  5   . insulin glargine (LANTUS) 100 UNIT/ML injection Inject 27 Units into the skin daily.      . insulin lispro (HUMALOG) 100 UNIT/ML injection Inject 0.02-0.08 mLs (2-8 Units total) into the skin See admin instructions. Three times per day per sliding scale  10 mL  5  . lamoTRIgine (LAMICTAL) 100 MG tablet Take 200 mg by mouth every morning.       Marland Kitchen omeprazole (PRILOSEC) 20 MG capsule Take 20 mg by mouth every morning.      . polyethylene glycol powder (GLYCOLAX/MIRALAX) powder Take 17 g by mouth 2 (two) times daily as needed.  3350 g  3  . ramelteon (ROZEREM) 8 MG tablet Take 8 mg by mouth at bedtime.      . traMADol (ULTRAM) 50 MG tablet Take 2 tablets (100 mg total) by mouth every 6 (six) hours as needed.  60 tablet  0  . ziprasidone (GEODON) 80 MG capsule Take 160 mg by mouth at bedtime.       . bisacodyl (DULCOLAX) 10 MG suppository Place 1 suppository (10 mg total) rectally as needed for moderate constipation.  12 suppository  0  . lidocaine-prilocaine (EMLA) cream Apply 1 application topically as needed (for pain).      Marland Kitchen senna-docusate (SENOKOT-S) 8.6-50 MG per tablet Take 1 tablet by mouth 2 (two) times daily.  90 tablet  3   No current facility-administered medications for this visit.    PHYSICAL EXAMINATION: ECOG PERFORMANCE STATUS: 1 - Symptomatic but completely ambulatory  Filed Vitals:   04/20/14 1512  BP: 111/86  Pulse: 81  Temp: 98.5 F (36.9 C)  Resp: 17   Filed Weights   04/20/14 1512  Weight: 165 lb 3.2 oz (74.934 kg)    GENERAL:alert, no distress and comfortable SKIN: skin color, texture, turgor are normal, no rashes or significant lesions EYES: normal, Conjunctiva are pink and non-injected, sclera clear OROPHARYNX:no exudate, no erythema and lips, buccal mucosa, and tongue normal  NECK: supple, thyroid normal size, non-tender, without nodularity LYMPH:  no palpable lymphadenopathy in the cervical, axillary or inguinal LUNGS: clear to auscultation and  percussion with normal breathing effort HEART: regular rate & rhythm and no murmurs and no lower extremity edema ABDOMEN:abdomen soft, non-tender and normal bowel sounds. No rebound or guarding Musculoskeletal:no cyanosis of digits and no clubbing  NEURO: alert & oriented x 3 with fluent speech, no focal motor/sensory deficits  LABORATORY DATA:  I have reviewed the data as listed    Component Value Date/Time   NA 147* 04/20/2014 1453   NA 137 01/12/2014 0527   K 3.9 04/20/2014 1453   K 3.8 01/12/2014 0527   CL 97 01/12/2014 0527   CO2 31* 04/20/2014 1453   CO2 27 01/12/2014 0527   GLUCOSE 52* 04/20/2014 1453   GLUCOSE 173* 01/12/2014 0527   BUN 10.4 04/20/2014 1453   BUN 12 01/12/2014 0527   CREATININE 1.3* 04/20/2014 1453   CREATININE 1.10 01/12/2014 0527   CREATININE 1.38* 05/26/2013 1703   CALCIUM 10.1 04/20/2014 1453   CALCIUM 8.9 01/12/2014 0527   PROT 8.3 04/20/2014 1453   PROT 7.1 01/11/2014 2005   ALBUMIN 3.6 04/20/2014 1453  ALBUMIN 3.4* 01/11/2014 2005   AST 29 04/20/2014 1453   AST 31 01/11/2014 2005   ALT 24 04/20/2014 1453   ALT 28 01/11/2014 2005   ALKPHOS 159* 04/20/2014 1453   ALKPHOS 137* 01/11/2014 2005   BILITOT 0.45 04/20/2014 1453   BILITOT 0.3 01/11/2014 2005   GFRNONAA 57* 01/12/2014 0527   GFRNONAA 44* 05/26/2013 1703   GFRAA 66* 01/12/2014 0527   GFRAA 51* 05/26/2013 1703    No results found for this basename: SPEP, UPEP,  kappa and lambda light chains    Lab Results  Component Value Date   WBC 5.6 04/20/2014   NEUTROABS 3.7 04/20/2014   HGB 14.5 04/20/2014   HCT 43.6 04/20/2014   MCV 87.0 04/20/2014   PLT 182 04/20/2014      Chemistry      Component Value Date/Time   NA 147* 04/20/2014 1453   NA 137 01/12/2014 0527   K 3.9 04/20/2014 1453   K 3.8 01/12/2014 0527   CL 97 01/12/2014 0527   CO2 31* 04/20/2014 1453   CO2 27 01/12/2014 0527   BUN 10.4 04/20/2014 1453   BUN 12 01/12/2014 0527   CREATININE 1.3* 04/20/2014 1453   CREATININE 1.10 01/12/2014 0527    CREATININE 1.38* 05/26/2013 1703      Component Value Date/Time   CALCIUM 10.1 04/20/2014 1453   CALCIUM 8.9 01/12/2014 0527   ALKPHOS 159* 04/20/2014 1453   ALKPHOS 137* 01/11/2014 2005   AST 29 04/20/2014 1453   AST 31 01/11/2014 2005   ALT 24 04/20/2014 1453   ALT 28 01/11/2014 2005   BILITOT 0.45 04/20/2014 1453   BILITOT 0.3 01/11/2014 2005       RADIOGRAPHIC STUDIES: I have personally reviewed the radiological images as listed and agreed with the findings in the report. Dg Abd 2 Views  04/20/2014   CLINICAL DATA:  Lower abdominal pain with constipation  EXAM: ABDOMEN - 2 VIEW  COMPARISON:  CT abdomen 02/20/2014  FINDINGS: The bowel gas pattern is normal. Moderate amount of stool throughout the colon. There is no evidence of free air. No radio-opaque calculi or other significant radiographic abnormality is seen.  IMPRESSION: Moderate amount of stool throughout the colon.   Electronically Signed   By: Kathreen Devoid   On: 04/20/2014 16:00     ASSESSMENT & PLAN:  Colon cancer Clinically, she has no signs of disease recurrence. Recommend observation only. Her next imaging study is due next month.  Constipation Her abdominal pain is likely due to severe constipation. Clinical examination is benign. Her blood work is unremarkable. Abdominal x-ray was reviewed which showed no evidence of bowel obstruction. I recommend additional laxatives to resolve this.   No orders of the defined types were placed in this encounter.   All questions were answered. The patient knows to call the clinic with any problems, questions or concerns. No barriers to learning was detected. I spent 25 minutes counseling the patient face to face. The total time spent in the appointment was 30 minutes and more than 50% was on counseling and review of test results     [facility], [place], MD 04/20/2014 8:02 PM

## 2014-04-20 NOTE — Telephone Encounter (Signed)
Pt confirmed appts she has arranged transportation.

## 2014-04-20 NOTE — Telephone Encounter (Signed)
Pt reports stomach aches for past few weeks,  Getting worse over past few days.  States the pain kept her in bed all day on Tuesday this week.  She denies constipation states actually having some loose stools a few times per day.  She continues to take miralax daily and one stool softener to prevent constipation.  Denies nausea.  States can only eat very small meal at a time, gets full easily.

## 2014-04-20 NOTE — Telephone Encounter (Signed)
I placed order to see her today. Please tell her to go to radiology to get XRAY first before checking in for labs and see me

## 2014-04-20 NOTE — Assessment & Plan Note (Signed)
Clinically, she has no signs of disease recurrence. Recommend observation only. Her next imaging study is due next month.

## 2014-04-20 NOTE — Telephone Encounter (Signed)
Asked pt to come to [facility] by 2:45 pm today for XRY then come to [facility] for lab and MD visit by 3:15 pm.  She says she has to find a ride.  She will call nurse back.

## 2014-04-28 ENCOUNTER — Telehealth: Payer: Self-pay | Admitting: *Deleted

## 2014-04-28 NOTE — Telephone Encounter (Signed)
Verbal order received and read back vrom Monica Lesser NP for KUB and for patient to come in after xray.  Called Monica [name] with these instructions.  "I had an xray last week and I am full of stool.  That's why she gave me the suppositories."  Notified Monica [name].  New orders received and read back from  for patient to take Miralax every four hours until liquid stools and then take Miralax daily with stool softener twice daily to regulate.   Monica [name] given these instructions.  Asked if she felt she needed to be seen to avoid trip to ER.  "I don't need to go to the ER.  Does she want to see me to hive me something for pain?"  Informed her pain medicine will make her constipated.  Does not wish to be seen.  "I also am afraid to eat."  Instructed to increase drinking water, hot herbal, decaffeinated teas, coffee, soups, broth and high fiber foods like bread, beans, apples.

## 2014-04-28 NOTE — Telephone Encounter (Signed)
   Provider input needed: Abdominal pain   Reason for call: pain relief  Gastrointestinal: positive for abdominal pain and constipation   ALLERGIES:  has No Known Allergies.  Patient last received chemotherapy/ treatment on 02-08-2014 FOLFOX/Avastin  Patient was last seen in the office on 04-20-2014  Next appt is 05-22-2014  Is patient having fevers greater than 100.5?  no   Is patient having uncontrolled pain, or new pain? "Abdominal tenderness greater on left than right mid abdomen started yesterday.  I haven't taken pain pill because I take Suboxone and I will have ill effects if taken with pain meds."     Is patient having new back pain that changes with position (worsens or eases when laying down?)  no   Is patient able to eat and drink? Yes, vomited once yesterday  Is patient able to pass stool without difficulty?   "I used all the suppositories I received and got glycerine suppositories over the counter.  I messed them up and I think I wasn't inserting them far enough so they're all gone."  (Dulcolax suppositories on med list)   Is patient having uncontrolled nausea?  no    patient calls 04/28/2014 with complaint of  Gastrointestinal: positive for abdominal pain, constipation and "My stomach aches, it's worse now and all I can do is lie in a ball.  Had a small amount of BM today after taking glycerine suppository.  I ate prunes and have taken my pill for irritable bowel syndrome.  I am taking senokot plus, one pill twice a day and it's not working.  I drink a pitcher of tea daily.  Abdomen is slightly distended at the top, not the bottom."     Summary Based on the above information advised patient to  Increase water intake to 64 oz, drink hot herbal teas or broth and increase senokot based on maximum amount on senokot instructions can take more.  Informed her Dr. Alvy Bimler is out until Monday.  Will notify a provider and call back with any new orders.  Call PCP for assist. was also  suggested.   [place], Odean Mcelwain  04/28/2014, 1:56 PM   Background Info  ORISSA ARREAGA   DOB: Oct 15, 1960   MR#: [id number]   CSN#   [id number] 04/28/2014

## 2014-05-01 ENCOUNTER — Other Ambulatory Visit: Payer: Self-pay | Admitting: *Deleted

## 2014-05-01 ENCOUNTER — Telehealth: Payer: Self-pay | Admitting: Hematology and Oncology

## 2014-05-01 NOTE — Telephone Encounter (Signed)
ADDED APPT W/CB FOR 9/29. PER 9/28 POF PT WILL WALK IN TOMORROW AFTER KUB AS SHE HAS TO FIND A RIDE. PER POF CB AWARE.

## 2014-05-02 ENCOUNTER — Ambulatory Visit (HOSPITAL_BASED_OUTPATIENT_CLINIC_OR_DEPARTMENT_OTHER): Payer: Medicaid Other | Admitting: Nurse Practitioner

## 2014-05-02 ENCOUNTER — Ambulatory Visit (HOSPITAL_COMMUNITY)
Admission: RE | Admit: 2014-05-02 | Discharge: 2014-05-02 | Disposition: A | Discharge status: Home / Self Care | Payer: Medicaid Other | Source: Ambulatory Visit | Attending: Nurse Practitioner | Admitting: Nurse Practitioner

## 2014-05-02 ENCOUNTER — Other Ambulatory Visit: Payer: Self-pay | Admitting: *Deleted

## 2014-05-02 VITALS — BP 120/67 | HR 76 | Temp 98.7°F | Resp 20 | Ht 65.0 in | Wt 168.8 lb

## 2014-05-02 DIAGNOSIS — Z85038 Personal history of other malignant neoplasm of large intestine: Secondary | ICD-10-CM | POA: Diagnosis not present

## 2014-05-02 DIAGNOSIS — R109 Unspecified abdominal pain: Secondary | ICD-10-CM | POA: Insufficient documentation

## 2014-05-02 DIAGNOSIS — K5900 Constipation, unspecified: Secondary | ICD-10-CM

## 2014-05-02 DIAGNOSIS — C189 Malignant neoplasm of colon, unspecified: Secondary | ICD-10-CM

## 2014-05-02 DIAGNOSIS — C787 Secondary malignant neoplasm of liver and intrahepatic bile duct: Secondary | ICD-10-CM

## 2014-05-04 ENCOUNTER — Encounter: Payer: Self-pay | Admitting: Nurse Practitioner

## 2014-05-04 NOTE — Assessment & Plan Note (Signed)
Patient last received chemotherapy in July 2015.  Her last restaging scan obtained 02/20/2014 showed near complete resolution of her disease.  Patient has plans for her a repeat lab draw and a restaging CT on 05/15/2014.  She has plans to return to the [facility] on 05/22/2014 for a followup visit and to review her scan results.

## 2014-05-04 NOTE — Assessment & Plan Note (Signed)
Patient's x-ray obtained on 04/20/2014 showed moderate stool burden in constipation.  Abdominal x-ray obtained earlier today revealed an increasing amount of stool burden and worsening constipation.  Most likely, patient's abdominal discomfort/constipation is due to chronic narcotic pain medication.  Patient states she takes stool softeners twice daily;  and MiraLAX three to four times per day.  Encouraged patient to continue with stool softeners twice daily; and to increase her MiraLAX if needed.

## 2014-05-04 NOTE — Progress Notes (Signed)
[place]   Chief Complaint  Patient presents with  . Abdominal Pain    HPI: Monica [name] 53 y.o. female diagnosed with colon cancer.  Patient is status post chemotherapy completed in July 2015.  Patient is currently undergoing active surveillance only.  Patient called the cancer Center today requesting urgent visit.  She is complaining of some generalized abdominal discomfort; and is concerned that this is cancer-related pain.  Patient states that she has been diagnosed with constipation in the past multiple times; and takes both stool softeners twice daily and MiraLAX up to 4 times per day.  She states she has to use a suppository to help her have any bowel movements whatsoever. She denies any nausea or vomiting.  She denies any recent fevers or chills.   Careful review of patient's medication list reveal the patient is taking multiple benzodiazepines and is on Suboxone.  Patient obtained an abdominal x-ray on 04/20/2014 which showed a moderate stool burden and constipation.  Abdominal Pain    CURRENT THERAPY: Upcoming Treatment Dates - COLORECTAL FOLFOX q14d Days with orders from any treatment category:  No upcoming days in selected categories.    Review of Systems  Gastrointestinal: Positive for abdominal pain.    Past Medical History  Diagnosis Date  . Diabetes mellitus   . Bipolar disorder   . Depression   . Hepatitis C   . Psychotic episode   . CORNEAL ABRASION, RIGHT 08/20/2010    Qualifier: Diagnosis of  By: Hassell Done FNP, Tori Milks    . Complication of anesthesia     Difficulty putting to sllep- done at [facility]  . Anxiety   . IBS (irritable bowel syndrome)   . PONV (postoperative nausea and vomiting)   . Subcutaneous nodule of anterior left knee 05/01/2013  . Dysmenorrhea 09/05/2013  . Leukopenia due to antineoplastic chemotherapy 09/28/2013  . Thrombocytopenia due to drugs 09/28/2013  . Neuropathy due to secondary diabetes 10/12/2013  . Colon  cancer 07/22/2013  . Constipation 04/20/2014    Past Surgical History  Procedure Laterality Date  . Cesarean section    . Colonoscopy N/A 07/19/2013    Procedure: COLONOSCOPY;  Surgeon: Beryle Beams, MD;  Location: [place];  Service: Endoscopy;  Laterality: N/A;  . Partial colectomy Right 07/20/2013    Procedure: PARTIAL COLECTOMY;  Surgeon: Zenovia Jarred, MD;  Location: [place];  Service: General;  Laterality: Right;  . Cholecystectomy N/A 07/20/2013    Procedure: CHOLECYSTECTOMY;  Surgeon: Zenovia Jarred, MD;  Location: [place];  Service: General;  Laterality: N/A;  . Liver surgery      small amt os scar tissue removed with colon surgery.  . Portacath placement Left 08/12/2013    Procedure: INSERTION PORT-A-CATH;  Surgeon: Zenovia Jarred, MD;  Location: [place];  Service: General;  Laterality: Left;    has HSV; HEPATITIS C; DIABETES MELLITUS, TYPE I, ADULT ONSET; Overweight (BMI 25.0-29.9); ANEMIA, CHRONIC; BIPOLAR AFFECTIVE DISORDER; TOBACCO ABUSE; SUBSTANCE ABUSE, MULTIPLE; HYPERTENSION, BENIGN ESSENTIAL; CKD (chronic kidney disease), stage III; Edema; HYPERLIPIDEMIA; LYMPHADENOPATHY; PERIMENOPAUSAL STATUS; Opioid dependence; Benzodiazepine abuse; Bilateral knee pain; Recurrent corneal erosion; Severe diabetic hypoglycemia; Polypharmacy; Constipation; Colonic mass; Colon cancer; Dysmenorrhea; Leukopenia due to antineoplastic chemotherapy; Thrombocytopenia due to drugs; Neuropathy due to secondary diabetes; Hypoglycemia; Metastasis to liver; and Constipation on her problem list.     is allergic to naproxen.    Medication List       This list is accurate as of: 05/02/14 11:59 PM.  Always use your most recent med list.               ACCU-CHEK AVIVA PLUS test strip  Generic drug:  glucose blood  USE AS DIRECTED     atorvastatin 40 MG tablet  Commonly known as:  LIPITOR  Take 40 mg by mouth daily.     bisacodyl 10 MG suppository  Commonly known as:  DULCOLAX  Place 1  suppository (10 mg total) rectally as needed for moderate constipation.     buprenorphine-naloxone 8-2 MG Subl SL tablet  Commonly known as:  SUBOXONE  Place 1 tablet under the tongue 3 (three) times daily.     clonazePAM 1 MG tablet  Commonly known as:  KLONOPIN  Take 1 mg by mouth 3 (three) times daily as needed for anxiety.     diazepam 10 MG tablet  Commonly known as:  VALIUM  Take 10 mg by mouth 3 (three) times daily.     furosemide 40 MG tablet  Commonly known as:  LASIX  Take 1 tablet (40 mg total) by mouth 2 (two) times daily.     gabapentin 300 MG capsule  Commonly known as:  NEURONTIN  Take 2 capsules (600 mg total) by mouth 2 (two) times daily.     insulin glargine 100 UNIT/ML injection  Commonly known as:  LANTUS  Inject 27 Units into the skin daily.     insulin lispro 100 UNIT/ML injection  Commonly known as:  HUMALOG  Inject 0.02-0.08 mLs (2-8 Units total) into the skin See admin instructions. Three times per day per sliding scale     lamoTRIgine 100 MG tablet  Commonly known as:  LAMICTAL  Take 200 mg by mouth every morning.     lidocaine-prilocaine cream  Commonly known as:  EMLA  Apply 1 application topically as needed (for pain).     omeprazole 20 MG capsule  Commonly known as:  PRILOSEC  Take 20 mg by mouth every morning.     polyethylene glycol powder powder  Commonly known as:  GLYCOLAX/MIRALAX  Take 17 g by mouth 2 (two) times daily as needed.     ramelteon 8 MG tablet  Commonly known as:  ROZEREM  Take 8 mg by mouth at bedtime.     senna-docusate 8.6-50 MG per tablet  Commonly known as:  Senokot-S  Take 1 tablet by mouth 2 (two) times daily.     traMADol 50 MG tablet  Commonly known as:  ULTRAM  Take 2 tablets (100 mg total) by mouth every 6 (six) hours as needed.     ziprasidone 80 MG capsule  Commonly known as:  GEODON  Take 160 mg by mouth at bedtime.         PHYSICAL EXAMINATION  Blood pressure 120/67, pulse 76, temperature  98.7 F (37.1 C), temperature source Oral, resp. rate 20, height 5\' 5"  (1.651 m), weight 168 lb 12.8 oz (76.567 kg).  Physical Exam  Nursing note and vitals reviewed. Constitutional: She is oriented to person, place, and time and well-developed, well-nourished, and in no distress.  HENT:  Head: Normocephalic and atraumatic.  Eyes: Conjunctivae and EOM are normal. Pupils are equal, round, and reactive to light. No scleral icterus.  Neck: Normal range of motion. Neck supple. No JVD present. No thyromegaly present.  Cardiovascular: Normal rate, regular rhythm, normal heart sounds and intact distal pulses.   Pulmonary/Chest: Effort normal and breath sounds normal. No respiratory distress.  Abdominal: Soft. Bowel sounds are  normal. She exhibits no distension and no mass. There is no tenderness. There is no rebound and no guarding.  Musculoskeletal: Normal range of motion. She exhibits no edema and no tenderness.  Lymphadenopathy:    She has no cervical adenopathy.  Neurological: She is oriented to person, place, and time. Gait normal.  Patient appears somewhat sedated; but will answer all questions appropriately and follow commands.  Confirmed with patient today that she did not drive; and has someone to drive her home as well.  Skin: Skin is warm and dry. No rash noted. No erythema.  Psychiatric:  Flat affect.   RADIOGRAPHIC STUDIES: Images    Show images for DG Abd 1 View         Study Result    CLINICAL DATA: Lower and mid abdominal pain for several weeks.  History of metastatic colon cancer.  EXAM:  ABDOMEN - 1 VIEW  COMPARISON: 04/20/2014  FINDINGS:  Borderline prominence of stool in the colon. No dilated small bowel.  Bowel anastomotic clips in the right abdomen. Cholecystectomy clips,  right upper quadrant.  IMPRESSION:  1. Prominent stool throughout the colon favors constipation.  Electronically Signed  By: Sherryl Barters M.D.  On: 05/02/2014 13:49       ASSESSMENT/PLAN:    Colon cancer  Assessment & Plan Patient last received chemotherapy in July 2015.  Her last restaging scan obtained 02/20/2014 showed near complete resolution of her disease.  Patient has plans for her a repeat lab draw and a restaging CT on 05/15/2014.  She has plans to return to the [facility] on 05/22/2014 for a followup visit and to review her scan results.   Constipation  Assessment & Plan Patient's x-ray obtained on 04/20/2014 showed moderate stool burden in constipation.  Abdominal x-ray obtained earlier today revealed an increasing amount of stool burden and worsening constipation.  Most likely, patient's abdominal discomfort/constipation is due to chronic narcotic pain medication.  Patient states she takes stool softeners twice daily;  and MiraLAX three to four times per day.  Encouraged patient to continue with stool softeners twice daily; and to increase her MiraLAX if needed.   Patient stated understanding of all instructions; and was in agreement with this plan of care. The patient knows to call the clinic with any problems, questions or concerns.   Review/collaboration with Dr. Alvy Bimler regarding all aspects of patient's visit today.   Total time spent with patient was 25 minutes;  with greater than 75 percent of that time spent in face to face counseling regarding her symptoms, and coordination of care and follow up.  Disclaimer: This note was dictated with voice recognition software. Similar sounding words can inadvertently be transcribed and may not be corrected upon review.   Drue Second, NP 05/04/2014

## 2014-05-14 ENCOUNTER — Encounter (HOSPITAL_COMMUNITY): Payer: Self-pay | Admitting: Emergency Medicine

## 2014-05-14 ENCOUNTER — Emergency Department (HOSPITAL_COMMUNITY)
Admission: EM | Admit: 2014-05-14 | Discharge: 2014-05-15 | Disposition: A | Discharge status: Home / Self Care | Payer: Medicaid Other | Attending: Emergency Medicine | Admitting: Emergency Medicine

## 2014-05-14 DIAGNOSIS — F319 Bipolar disorder, unspecified: Secondary | ICD-10-CM | POA: Insufficient documentation

## 2014-05-14 DIAGNOSIS — E11649 Type 2 diabetes mellitus with hypoglycemia without coma: Secondary | ICD-10-CM | POA: Diagnosis present

## 2014-05-14 DIAGNOSIS — Z720 Tobacco use: Secondary | ICD-10-CM | POA: Insufficient documentation

## 2014-05-14 DIAGNOSIS — R6883 Chills (without fever): Secondary | ICD-10-CM | POA: Insufficient documentation

## 2014-05-14 DIAGNOSIS — F419 Anxiety disorder, unspecified: Secondary | ICD-10-CM | POA: Insufficient documentation

## 2014-05-14 DIAGNOSIS — Z79899 Other long term (current) drug therapy: Secondary | ICD-10-CM | POA: Diagnosis not present

## 2014-05-14 DIAGNOSIS — Z85038 Personal history of other malignant neoplasm of large intestine: Secondary | ICD-10-CM | POA: Insufficient documentation

## 2014-05-14 DIAGNOSIS — Z87828 Personal history of other (healed) physical injury and trauma: Secondary | ICD-10-CM | POA: Insufficient documentation

## 2014-05-14 DIAGNOSIS — Z794 Long term (current) use of insulin: Secondary | ICD-10-CM | POA: Diagnosis not present

## 2014-05-14 DIAGNOSIS — E162 Hypoglycemia, unspecified: Secondary | ICD-10-CM

## 2014-05-14 DIAGNOSIS — Z8619 Personal history of other infectious and parasitic diseases: Secondary | ICD-10-CM | POA: Insufficient documentation

## 2014-05-14 DIAGNOSIS — Z8719 Personal history of other diseases of the digestive system: Secondary | ICD-10-CM | POA: Insufficient documentation

## 2014-05-14 DIAGNOSIS — Z862 Personal history of diseases of the blood and blood-forming organs and certain disorders involving the immune mechanism: Secondary | ICD-10-CM | POA: Insufficient documentation

## 2014-05-14 LAB — CBC WITH DIFFERENTIAL/PLATELET
BASOS ABS: 0 10*3/uL (ref 0.0–0.1)
BASOS PCT: 0 % (ref 0–1)
Eosinophils Absolute: 0.2 10*3/uL (ref 0.0–0.7)
Eosinophils Relative: 3 % (ref 0–5)
HEMATOCRIT: 37.7 % (ref 36.0–46.0)
Hemoglobin: 12.5 g/dL (ref 12.0–15.0)
Lymphocytes Relative: 25 % (ref 12–46)
Lymphs Abs: 1.3 10*3/uL (ref 0.7–4.0)
MCH: 28.3 pg (ref 26.0–34.0)
MCHC: 33.2 g/dL (ref 30.0–36.0)
MCV: 85.3 fL (ref 78.0–100.0)
Monocytes Absolute: 0.4 10*3/uL (ref 0.1–1.0)
Monocytes Relative: 8 % (ref 3–12)
NEUTROS ABS: 3.3 10*3/uL (ref 1.7–7.7)
Neutrophils Relative %: 64 % (ref 43–77)
PLATELETS: 180 10*3/uL (ref 150–400)
RBC: 4.42 MIL/uL (ref 3.87–5.11)
RDW: 12.7 % (ref 11.5–15.5)
WBC: 5.2 10*3/uL (ref 4.0–10.5)

## 2014-05-14 LAB — COMPREHENSIVE METABOLIC PANEL
ALBUMIN: 3.1 g/dL — AB (ref 3.5–5.2)
ALT: 18 U/L (ref 0–35)
AST: 28 U/L (ref 0–37)
Alkaline Phosphatase: 124 U/L — ABNORMAL HIGH (ref 39–117)
Anion gap: 14 (ref 5–15)
BUN: 8 mg/dL (ref 6–23)
CHLORIDE: 99 meq/L (ref 96–112)
CO2: 29 mEq/L (ref 19–32)
CREATININE: 0.98 mg/dL (ref 0.50–1.10)
Calcium: 9.1 mg/dL (ref 8.4–10.5)
GFR calc Af Amer: 76 mL/min — ABNORMAL LOW (ref >90)
GFR calc non Af Amer: 65 mL/min — ABNORMAL LOW (ref >90)
Glucose, Bld: 88 mg/dL (ref 70–99)
Potassium: 3.8 mEq/L (ref 3.7–5.3)
Sodium: 142 mEq/L (ref 137–147)
Total Bilirubin: 0.2 mg/dL — ABNORMAL LOW (ref 0.3–1.2)
Total Protein: 7.1 g/dL (ref 6.0–8.3)

## 2014-05-14 LAB — CBG MONITORING, ED
GLUCOSE-CAPILLARY: 101 mg/dL — AB (ref 70–99)
Glucose-Capillary: 107 mg/dL — ABNORMAL HIGH (ref 70–99)

## 2014-05-14 NOTE — ED Notes (Signed)
Pt took 7 units of insulin instead of 5 units and her glucose dropped to 40 , pt ate prior to EMS arrival and now glucose 102 per EMS

## 2014-05-14 NOTE — ED Provider Notes (Signed)
53 year old female with history of diabetes, took 7 units of insulin this evening without checking her blood sugar, noticed that she was hypoglycemic afterwards, has had a sugar drink as well as food prior to arrival and has no symptoms at this time. On exam and a soft abdomen and clear heart and lung sounds, recheck blood sugar at regular intervals, otherwise appears stable for discharge if she can tolerate food.  Medical screening examination/treatment/procedure(s) were conducted as a shared visit with non-physician practitioner(s) and myself.  I personally evaluated the patient during the encounter.  Clinical Impression:   Final diagnoses:  Hypoglycemia         Johnna Acosta, MD 05/16/14 [phone]

## 2014-05-14 NOTE — ED Provider Notes (Signed)
CSN: [id number]     Arrival date & time 05/14/14  2139 History   First MD Initiated Contact with Patient 05/14/14 2235     Chief Complaint  Patient presents with  . Hypoglycemia   (Consider location/radiation/quality/duration/timing/severity/associated sxs/prior Treatment) HPI Monica [name] is a 53 yo female presenting with report of low blood sugars this evening.  She reports appr 2 hours prior to arrival she treated herself with 7 units of humalog.  She did not check her blood sugar prior to treating with insulin, the dose is based on the amount of carbs she ate.  She ate dinner however several hours prior to the dose of insulin.  She felt like her blood sugar was high because she was having chills, so she gave her dose of humalog, then checked her blood sugar and the meter read "44".   She quickly began drank 16 oz of juice and called the ambulance.  She has chronic abd pain and no changes with that pain but she denies nausea, vomiting or syncopal episode.   Past Medical History  Diagnosis Date  . Diabetes mellitus   . Bipolar disorder   . Depression   . Hepatitis C   . Psychotic episode   . CORNEAL ABRASION, RIGHT 08/20/2010    Qualifier: Diagnosis of  By: Hassell Done FNP, Tori Milks    . Complication of anesthesia     Difficulty putting to sllep- done at [facility]  . Anxiety   . IBS (irritable bowel syndrome)   . PONV (postoperative nausea and vomiting)   . Subcutaneous nodule of anterior left knee 05/01/2013  . Dysmenorrhea 09/05/2013  . Leukopenia due to antineoplastic chemotherapy 09/28/2013  . Thrombocytopenia due to drugs 09/28/2013  . Neuropathy due to secondary diabetes 10/12/2013  . Colon cancer 07/22/2013  . Constipation 04/20/2014   Past Surgical History  Procedure Laterality Date  . Cesarean section    . Colonoscopy N/A 07/19/2013    Procedure: COLONOSCOPY;  Surgeon: Beryle Beams, MD;  Location: [place];  Service: Endoscopy;  Laterality: N/A;  . Partial colectomy Right  07/20/2013    Procedure: PARTIAL COLECTOMY;  Surgeon: Zenovia Jarred, MD;  Location: [place];  Service: General;  Laterality: Right;  . Cholecystectomy N/A 07/20/2013    Procedure: CHOLECYSTECTOMY;  Surgeon: Zenovia Jarred, MD;  Location: [place];  Service: General;  Laterality: N/A;  . Liver surgery      small amt os scar tissue removed with colon surgery.  . Portacath placement Left 08/12/2013    Procedure: INSERTION PORT-A-CATH;  Surgeon: Zenovia Jarred, MD;  Location: [place];  Service: General;  Laterality: Left;   No family history on file. History  Substance Use Topics  . Smoking status: Current Every Day Smoker -- 0.50 packs/day for 20 years    Types: Cigarettes  . Smokeless tobacco: Never Used     Comment: smokes 6 cig/daily. uses the patch  . Alcohol Use: No   OB History   Grav Para Term Preterm Abortions TAB SAB Ect Mult Living                 Review of Systems  Constitutional: Positive for chills. Negative for fever.  HENT: Negative for sore throat.   Eyes: Negative for visual disturbance.  Respiratory: Negative for cough and shortness of breath.   Cardiovascular: Negative for chest pain and leg swelling.  Gastrointestinal: Negative for nausea, vomiting and diarrhea.  Genitourinary: Negative for dysuria.  Musculoskeletal: Negative for myalgias.  Skin: Negative for rash.  Neurological: Negative for weakness, numbness and headaches.    Allergies  Naproxen  Home Medications   Prior to Admission medications   Medication Sig Start Date End Date Taking? Authorizing Provider  atorvastatin (LIPITOR) 40 MG tablet Take 40 mg by mouth daily.   Yes Historical Provider, MD  clonazePAM (KLONOPIN) 1 MG tablet Take 1 mg by mouth 3 (three) times daily.    Yes Historical Provider, MD  furosemide (LASIX) 40 MG tablet Take 1 tablet (40 mg total) by mouth 2 (two) times daily. 09/28/12  Yes Angelica Ran, MD  gabapentin (NEURONTIN) 800 MG tablet Take 800 mg by mouth 3 (three)  times daily.   Yes Historical Provider, MD  HYDROXYZINE PAMOATE PO Take 25 mg by mouth 3 (three) times daily.   Yes Historical Provider, MD  insulin glargine (LANTUS) 100 UNIT/ML injection Inject 27 Units into the skin daily. 01/13/14  Yes Cordelia Poche, MD  insulin lispro (HUMALOG) 100 UNIT/ML injection Inject 1-10 Units into the skin 3 (three) times daily before meals.   Yes Historical Provider, MD  lamoTRIgine (LAMICTAL) 100 MG tablet Take 200 mg by mouth every morning.    Yes Historical Provider, MD  omeprazole (PRILOSEC) 20 MG capsule Take 20 mg by mouth every morning.   Yes Historical Provider, MD  senna-docusate (SENOKOT-S) 8.6-50 MG per tablet Take 1 tablet by mouth 2 (two) times daily. 04/20/14  Yes Heath Lark, MD  ziprasidone (GEODON) 80 MG capsule Take 160 mg by mouth at bedtime.    Yes Historical Provider, MD  lidocaine-prilocaine (EMLA) cream Apply 1 application topically as needed (for pain). 08/05/13   Heath Lark, MD  polyethylene glycol powder (GLYCOLAX/MIRALAX) powder Take 17 g by mouth 2 (two) times daily as needed. 04/05/14   Elberta Leatherwood, MD   BP 127/88  Pulse 84  Temp(Src) 97.7 F (36.5 C) (Oral)  Resp 18  SpO2 100% Physical Exam  Nursing note and vitals reviewed. Constitutional: She is oriented to person, place, and time. She appears well-developed and well-nourished. No distress.  HENT:  Head: Normocephalic and atraumatic.  Mouth/Throat: Oropharynx is clear and moist. No oropharyngeal exudate.  Eyes: Conjunctivae are normal. Pupils are equal, round, and reactive to light.  Neck: Normal range of motion. Neck supple. No thyromegaly present.  Cardiovascular: Normal rate, regular rhythm and intact distal pulses.   Pulmonary/Chest: Effort normal and breath sounds normal. No respiratory distress. She has no wheezes. She has no rales. She exhibits no tenderness.  Abdominal: Soft. There is no tenderness.  Musculoskeletal: She exhibits no tenderness.  Lymphadenopathy:    She has  no cervical adenopathy.  Neurological: She is alert and oriented to person, place, and time. No cranial nerve deficit. Coordination normal.  Skin: Skin is warm and dry. No rash noted. She is not diaphoretic.  Psychiatric: She has a normal mood and affect.    ED Course  Procedures (including critical care time) Labs Review Labs Reviewed  COMPREHENSIVE METABOLIC PANEL - Abnormal; Notable for the following:    Albumin 3.1 (*)    Alkaline Phosphatase 124 (*)    Total Bilirubin 0.2 (*)    GFR calc non Af Amer 65 (*)    GFR calc Af Amer 76 (*)    All other components within normal limits  CBG MONITORING, ED - Abnormal; Notable for the following:    Glucose-Capillary 107 (*)    All other components within normal limits  CBG MONITORING, ED - Abnormal;  Notable for the following:    Glucose-Capillary 101 (*)    All other components within normal limits  CBG MONITORING, ED - Abnormal; Notable for the following:    Glucose-Capillary 128 (*)    All other components within normal limits  CBC WITH DIFFERENTIAL   Imaging Review No results found.   EKG Interpretation None      MDM   Final diagnoses:  Hypoglycemia   53 yo female presenting with reported hypoglycemia after taking rapid acting insulin prior to checking fingerstick.  She was brought in by ambulance after oral intake of juice and candy.  No alteration in mental status and labs drawn from triage without abnormality. Pt given sandwich and serial CBGs in the ED of been over 100 each time.  Pt appears safe to return home.  Discussed with pt importance of checking fingerstick prior to insulin administration. Discharge instructions include follow-up with PCP.  Pt in agreement with plan.  Return precautions provided.    Filed Vitals:   05/14/14 2147 05/15/14 0105  BP: 127/88 110/64  Pulse: 84 64  Temp: 97.7 F (36.5 C)   TempSrc: Oral   Resp: 18 16  SpO2: 100% 95%   Meds given in ED:  Medications - No data to  display  Discharge Medication List as of 05/15/2014 12:31 AM         Britt Bottom, NP 05/16/14 0000

## 2014-05-14 NOTE — ED Notes (Signed)
Per EDP Sabra Heck, pt given sandwich, saltine crackers, and ginger ale to get blood sugar up.

## 2014-05-15 ENCOUNTER — Ambulatory Visit (HOSPITAL_COMMUNITY)
Admission: RE | Admit: 2014-05-15 | Discharge: 2014-05-15 | Disposition: A | Discharge status: Home / Self Care | Payer: Medicaid Other | Source: Ambulatory Visit | Attending: Hematology and Oncology | Admitting: Hematology and Oncology

## 2014-05-15 ENCOUNTER — Ambulatory Visit (HOSPITAL_BASED_OUTPATIENT_CLINIC_OR_DEPARTMENT_OTHER): Payer: Medicaid Other

## 2014-05-15 ENCOUNTER — Telehealth: Payer: Self-pay | Admitting: *Deleted

## 2014-05-15 ENCOUNTER — Other Ambulatory Visit (HOSPITAL_BASED_OUTPATIENT_CLINIC_OR_DEPARTMENT_OTHER): Payer: Medicaid Other

## 2014-05-15 ENCOUNTER — Encounter (HOSPITAL_COMMUNITY): Payer: Self-pay

## 2014-05-15 DIAGNOSIS — C787 Secondary malignant neoplasm of liver and intrahepatic bile duct: Secondary | ICD-10-CM | POA: Diagnosis not present

## 2014-05-15 DIAGNOSIS — C189 Malignant neoplasm of colon, unspecified: Secondary | ICD-10-CM | POA: Insufficient documentation

## 2014-05-15 DIAGNOSIS — Z95828 Presence of other vascular implants and grafts: Secondary | ICD-10-CM

## 2014-05-15 DIAGNOSIS — Z85038 Personal history of other malignant neoplasm of large intestine: Secondary | ICD-10-CM

## 2014-05-15 DIAGNOSIS — R109 Unspecified abdominal pain: Secondary | ICD-10-CM

## 2014-05-15 LAB — COMPREHENSIVE METABOLIC PANEL (CC13)
ALBUMIN: 3.1 g/dL — AB (ref 3.5–5.0)
ALK PHOS: 131 U/L (ref 40–150)
ALT: 20 U/L (ref 0–55)
AST: 30 U/L (ref 5–34)
Anion Gap: 12 mEq/L — ABNORMAL HIGH (ref 3–11)
BUN: 7.5 mg/dL (ref 7.0–26.0)
CHLORIDE: 101 meq/L (ref 98–109)
CO2: 30 mEq/L — ABNORMAL HIGH (ref 22–29)
Calcium: 9.6 mg/dL (ref 8.4–10.4)
Creatinine: 1.1 mg/dL (ref 0.6–1.1)
GLUCOSE: 225 mg/dL — AB (ref 70–140)
POTASSIUM: 3.5 meq/L (ref 3.5–5.1)
SODIUM: 143 meq/L (ref 136–145)
TOTAL PROTEIN: 7.3 g/dL (ref 6.4–8.3)
Total Bilirubin: 0.28 mg/dL (ref 0.20–1.20)

## 2014-05-15 LAB — CBC WITH DIFFERENTIAL/PLATELET
BASO%: 1.1 % (ref 0.0–2.0)
Basophils Absolute: 0 10*3/uL (ref 0.0–0.1)
EOS ABS: 0.2 10*3/uL (ref 0.0–0.5)
EOS%: 3.6 % (ref 0.0–7.0)
HCT: 39.8 % (ref 34.8–46.6)
HGB: 12.9 g/dL (ref 11.6–15.9)
LYMPH#: 1.2 10*3/uL (ref 0.9–3.3)
LYMPH%: 26.9 % (ref 14.0–49.7)
MCH: 27.4 pg (ref 25.1–34.0)
MCHC: 32.3 g/dL (ref 31.5–36.0)
MCV: 84.8 fL (ref 79.5–101.0)
MONO#: 0.4 10*3/uL (ref 0.1–0.9)
MONO%: 7.9 % (ref 0.0–14.0)
NEUT%: 60.5 % (ref 38.4–76.8)
NEUTROS ABS: 2.7 10*3/uL (ref 1.5–6.5)
Platelets: 200 10*3/uL (ref 145–400)
RBC: 4.69 10*6/uL (ref 3.70–5.45)
RDW: 13.3 % (ref 11.2–14.5)
WBC: 4.5 10*3/uL (ref 3.9–10.3)

## 2014-05-15 LAB — CBG MONITORING, ED: Glucose-Capillary: 128 mg/dL — ABNORMAL HIGH (ref 70–99)

## 2014-05-15 LAB — UA PROTEIN, DIPSTICK - CHCC: PROTEIN: NEGATIVE mg/dL

## 2014-05-15 MED ORDER — IOHEXOL 300 MG/ML  SOLN
100.0000 mL | Freq: Once | INTRAMUSCULAR | Status: AC | PRN
  Administered 2014-05-15: 100 mL via INTRAVENOUS

## 2014-05-15 MED ORDER — HEPARIN SOD (PORK) LOCK FLUSH 100 UNIT/ML IV SOLN
500.0000 [IU] | Freq: Once | INTRAVENOUS | Status: DC
  Filled 2014-05-15: qty 5

## 2014-05-15 MED ORDER — SODIUM CHLORIDE 0.9 % IJ SOLN
10.0000 mL | INTRAMUSCULAR | Status: DC | PRN
  Administered 2014-05-15: 10 mL via INTRAVENOUS
  Filled 2014-05-15: qty 10

## 2014-05-15 NOTE — Patient Instructions (Signed)

## 2014-05-15 NOTE — Telephone Encounter (Signed)
Per staff message I have scheduled aaptps

## 2014-05-15 NOTE — Discharge Instructions (Signed)
Please follow the directions provided.  Be sure to to monitor your blood sugars as directed.  Always check your blood sugars prior to giving yourself insulin.  Please follow-up with your primary care provider regarding your visit tonight.  Don't hesitate to to return for new, worsening or concerning symptoms.    SEEK MEDICAL CARE IF:  You are having problems keeping your blood glucose in your target range.  You are having frequent episodes of hypoglycemia.  You feel you might be having side effects from your medicines.  You are not sure why your blood glucose is dropping so low.  You notice a change in vision or a new problem with your vision. SEEK IMMEDIATE MEDICAL CARE IF:  Confusion develops.  A change in mental status occurs.  The inability to swallow develops.  Fainting occurs.

## 2014-05-16 NOTE — ED Provider Notes (Signed)
Medical screening examination/treatment/procedure(s) were conducted as a shared visit with non-physician practitioner(s) and myself.  I personally evaluated the patient during the encounter  Please see my separate respective documentation pertaining to this patient encounter   Johnna Acosta, MD 05/16/14 [phone]

## 2014-05-18 LAB — HM DIABETES EYE EXAM

## 2014-05-22 ENCOUNTER — Ambulatory Visit (HOSPITAL_BASED_OUTPATIENT_CLINIC_OR_DEPARTMENT_OTHER): Payer: Medicaid Other

## 2014-05-22 ENCOUNTER — Ambulatory Visit (HOSPITAL_BASED_OUTPATIENT_CLINIC_OR_DEPARTMENT_OTHER): Payer: Medicaid Other | Admitting: Hematology and Oncology

## 2014-05-22 ENCOUNTER — Encounter: Payer: Self-pay | Admitting: Hematology and Oncology

## 2014-05-22 ENCOUNTER — Telehealth: Payer: Self-pay | Admitting: Hematology and Oncology

## 2014-05-22 ENCOUNTER — Encounter: Payer: Self-pay | Admitting: Nurse Practitioner

## 2014-05-22 VITALS — BP 122/66 | HR 60 | Temp 97.9°F | Resp 20 | Ht 65.0 in | Wt 169.9 lb

## 2014-05-22 DIAGNOSIS — C182 Malignant neoplasm of ascending colon: Secondary | ICD-10-CM

## 2014-05-22 DIAGNOSIS — F17200 Nicotine dependence, unspecified, uncomplicated: Secondary | ICD-10-CM

## 2014-05-22 DIAGNOSIS — Z5111 Encounter for antineoplastic chemotherapy: Secondary | ICD-10-CM

## 2014-05-22 DIAGNOSIS — E1340 Other specified diabetes mellitus with diabetic neuropathy, unspecified: Secondary | ICD-10-CM

## 2014-05-22 DIAGNOSIS — C787 Secondary malignant neoplasm of liver and intrahepatic bile duct: Secondary | ICD-10-CM

## 2014-05-22 DIAGNOSIS — C189 Malignant neoplasm of colon, unspecified: Secondary | ICD-10-CM

## 2014-05-22 DIAGNOSIS — F1120 Opioid dependence, uncomplicated: Secondary | ICD-10-CM

## 2014-05-22 DIAGNOSIS — C786 Secondary malignant neoplasm of retroperitoneum and peritoneum: Secondary | ICD-10-CM

## 2014-05-22 DIAGNOSIS — K5909 Other constipation: Secondary | ICD-10-CM

## 2014-05-22 DIAGNOSIS — R109 Unspecified abdominal pain: Secondary | ICD-10-CM

## 2014-05-22 DIAGNOSIS — K5900 Constipation, unspecified: Secondary | ICD-10-CM

## 2014-05-22 DIAGNOSIS — G8929 Other chronic pain: Secondary | ICD-10-CM

## 2014-05-22 DIAGNOSIS — F1129 Opioid dependence with unspecified opioid-induced disorder: Secondary | ICD-10-CM

## 2014-05-22 MED ORDER — DEXTROSE 5 % IV SOLN
50.0000 mg/m2 | Freq: Once | INTRAVENOUS | Status: AC
  Administered 2014-05-22: 85 mg via INTRAVENOUS
  Filled 2014-05-22: qty 17

## 2014-05-22 MED ORDER — LEUCOVORIN CALCIUM INJECTION 350 MG
400.0000 mg/m2 | Freq: Once | INTRAVENOUS | Status: AC
  Administered 2014-05-22: 692 mg via INTRAVENOUS
  Filled 2014-05-22: qty 34.6

## 2014-05-22 MED ORDER — SODIUM CHLORIDE 0.9 % IJ SOLN
10.0000 mL | INTRAMUSCULAR | Status: DC | PRN
  Filled 2014-05-22: qty 10

## 2014-05-22 MED ORDER — ONDANSETRON 8 MG/50ML IVPB (CHCC)
8.0000 mg | Freq: Once | INTRAVENOUS | Status: AC
  Administered 2014-05-22: 8 mg via INTRAVENOUS

## 2014-05-22 MED ORDER — DEXTROSE 5 % IV SOLN
Freq: Once | INTRAVENOUS | Status: AC
  Administered 2014-05-22: 11:00:00 via INTRAVENOUS

## 2014-05-22 MED ORDER — SODIUM CHLORIDE 0.9 % IV SOLN
2400.0000 mg/m2 | INTRAVENOUS | Status: DC
  Administered 2014-05-22: 4150 mg via INTRAVENOUS
  Filled 2014-05-22: qty 83

## 2014-05-22 MED ORDER — DEXAMETHASONE SODIUM PHOSPHATE 10 MG/ML IJ SOLN
INTRAMUSCULAR | Status: AC
  Filled 2014-05-22: qty 1

## 2014-05-22 MED ORDER — HEPARIN SOD (PORK) LOCK FLUSH 100 UNIT/ML IV SOLN
500.0000 [IU] | Freq: Once | INTRAVENOUS | Status: DC | PRN
  Filled 2014-05-22: qty 5

## 2014-05-22 MED ORDER — ONDANSETRON 8 MG/NS 50 ML IVPB
INTRAVENOUS | Status: AC
  Filled 2014-05-22: qty 8

## 2014-05-22 MED ORDER — DEXAMETHASONE SODIUM PHOSPHATE 10 MG/ML IJ SOLN
10.0000 mg | Freq: Once | INTRAMUSCULAR | Status: AC
  Administered 2014-05-22: 10 mg via INTRAVENOUS

## 2014-05-22 NOTE — Assessment & Plan Note (Signed)
I spent some time counseling the patient the importance of tobacco cessation. She is currently not interested to quit now. 

## 2014-05-22 NOTE — Assessment & Plan Note (Signed)
She has chronic constipation and on multiple different medications that could cause constipation. Clinically, she does not have signs and symptoms of bowel obstruction. I recommend she continues laxatives therapy. I would not restart her Avastin until she has regular bowel movement.

## 2014-05-22 NOTE — Progress Notes (Signed)
[place] OFFICE PROGRESS NOTE  Patient Care Team: Elberta Leatherwood, MD as PCP - General Windy Kalata, MD as Consulting Physician (Nephrology) Leatrice Jewels Rayetta Pigg, MD as Consulting Physician (Psychiatry) Heath Lark, MD as Consulting Physician (Hematology and Oncology)  SUMMARY OF ONCOLOGIC HISTORY: Oncology History   Colon cancer, KRAS positive   Primary site: Colon and Rectum (Right)   Staging method: AJCC 7th Edition   Clinical: (T4b, N2b, M1) signed by Heath Lark, MD on 08/05/2013  9:52 AM   Pathologic: Stage IVB (T4b, N2b, M1b) signed by Heath Lark, MD on 08/05/2013 10:21 AM   Summary: Stage IVB (T4b, N2b, M1b)      Colon cancer   07/15/2013 - 07/25/2013 Hospital Admission The patient presented to the hospital with pain in her abdomen and underwent surgery for colon cancer   07/15/2013 Imaging CT scan of the abdomen show bowel dilatation suspicious for malignancy   07/17/2013 Tumor Marker Preoperative CEA was 2.2, normal   07/19/2013 Procedure Colonoscopy revealed ascending colon lesion with significant stricture and biopsy showed adenocarcinoma   07/20/2013 Surgery She underwent right hemicolectomy, cholecystectomy, liver biopsy, excision of abdominal wall mass and omental mass. During surgery, she was also found to have drop metastasis   08/29/2013 Imaging Repeat CT scan show persistent liver metastasis and new omental metastasis   08/31/2013 - 02/10/2014 Chemotherapy The patient was started on palliative chemotherapy with FOLFOX, bolus 5-FU committed and oxaliplatin dose reduced due to  peripheral neuropathy. She has completed 12 cycles of treatment.   11/22/2013 Imaging Repeat CT scan showed near complete response to treatment.   11/23/2013 - 02/10/2014 Chemotherapy Avastin was added to her chemotherapy regimen   12/21/2013 Adverse Reaction Oxaliplatin was omitted due to worsening peripheral neuropathy.   02/20/2014 Imaging Repeat CT scan showed near complete  resolution of all disease.   05/15/2014 Imaging Repeat CT scan showed peritoneal metastasis and recurrence of disease   05/22/2014 -  Chemotherapy Modify dosings of FOLFOX was restarted.    INTERVAL HISTORY: Please see below for problem oriented charting. She is seen today prior to restart of chemotherapy. She still had persistent abdominal pain. Appetite is poor. Denies nausea vomiting.   REVIEW OF SYSTEMS:   Constitutional: Denies fevers, chills or abnormal weight loss Eyes: Denies blurriness of vision Ears, nose, mouth, throat, and face: Denies mucositis or sore throat Respiratory: Denies cough, dyspnea or wheezes Cardiovascular: Denies palpitation, chest discomfort or lower extremity swelling Skin: Denies abnormal skin rashes Lymphatics: Denies new lymphadenopathy or easy bruising Neurological:Denies numbness, tingling or new weaknesses Behavioral/Psych: Mood is stable, no new changes  All other systems were reviewed with the patient and are negative.  I have reviewed the past medical history, past surgical history, social history and family history with the patient and they are unchanged from previous note.  ALLERGIES:  is allergic to naproxen.  MEDICATIONS:  Current Outpatient Prescriptions  Medication Sig Dispense Refill  . atorvastatin (LIPITOR) 40 MG tablet Take 40 mg by mouth daily.      . Buprenorphine HCl-Naloxone HCl (SUBOXONE SL) Place 8 mg under the tongue 3 (three) times daily.      . clonazePAM (KLONOPIN) 1 MG tablet Take 1 mg by mouth 3 (three) times daily.       . furosemide (LASIX) 40 MG tablet Take 1 tablet (40 mg total) by mouth 2 (two) times daily.  60 tablet  5  . gabapentin (NEURONTIN) 800 MG tablet Take 800 mg by mouth 3 (three)  times daily.      Marland Kitchen HYDROXYZINE PAMOATE PO Take 25 mg by mouth 3 (three) times daily.      . insulin glargine (LANTUS) 100 UNIT/ML injection Inject 27 Units into the skin daily.      . insulin lispro (HUMALOG) 100 UNIT/ML  injection Inject 1-10 Units into the skin 3 (three) times daily before meals.      . lamoTRIgine (LAMICTAL) 100 MG tablet Take 200 mg by mouth every morning.       . lidocaine-prilocaine (EMLA) cream Apply 1 application topically as needed (for pain).      Marland Kitchen omeprazole (PRILOSEC) 20 MG capsule Take 20 mg by mouth every morning.      . polyethylene glycol powder (GLYCOLAX/MIRALAX) powder Take 17 g by mouth 2 (two) times daily as needed.  3350 g  3  . senna-docusate (SENOKOT-S) 8.6-50 MG per tablet Take 1 tablet by mouth 2 (two) times daily.  90 tablet  3  . ziprasidone (GEODON) 80 MG capsule Take 160 mg by mouth at bedtime.        No current facility-administered medications for this visit.    PHYSICAL EXAMINATION: ECOG PERFORMANCE STATUS: 1 - Symptomatic but completely ambulatory  Filed Vitals:   05/22/14 0911  BP: 122/66  Pulse: 60  Temp: 97.9 F (36.6 C)  Resp: 20   Filed Weights   05/22/14 0911  Weight: 169 lb 14.4 oz (77.066 kg)    GENERAL:alert, no distress and comfortable. She looks somnolent and sedated SKIN: skin color, texture, turgor are normal, no rashes or significant lesions EYES: normal, Conjunctiva are pink and non-injected, sclera clear OROPHARYNX:no exudate, no erythema and lips, buccal mucosa, and tongue normal  NECK: supple, thyroid normal size, non-tender, without nodularity LYMPH:  no palpable lymphadenopathy in the cervical, axillary or inguinal LUNGS: clear to auscultation and percussion with normal breathing effort HEART: regular rate & rhythm and no murmurs and no lower extremity edema ABDOMEN:abdomen soft, non-tender with reduced bowel sounds Musculoskeletal:no cyanosis of digits and no clubbing  NEURO: alert & oriented x 3 with fluent speech, no focal motor/sensory deficits  LABORATORY DATA:  I have reviewed the data as listed    Component Value Date/Time   NA 143 05/15/2014 1021   NA 142 05/14/2014 2233   K 3.5 05/15/2014 1021   K 3.8  05/14/2014 2233   CL 99 05/14/2014 2233   CO2 30* 05/15/2014 1021   CO2 29 05/14/2014 2233   GLUCOSE 225* 05/15/2014 1021   GLUCOSE 88 05/14/2014 2233   BUN 7.5 05/15/2014 1021   BUN 8 05/14/2014 2233   CREATININE 1.1 05/15/2014 1021   CREATININE 0.98 05/14/2014 2233   CREATININE 1.38* 05/26/2013 1703   CALCIUM 9.6 05/15/2014 1021   CALCIUM 9.1 05/14/2014 2233   PROT 7.3 05/15/2014 1021   PROT 7.1 05/14/2014 2233   ALBUMIN 3.1* 05/15/2014 1021   ALBUMIN 3.1* 05/14/2014 2233   AST 30 05/15/2014 1021   AST 28 05/14/2014 2233   ALT 20 05/15/2014 1021   ALT 18 05/14/2014 2233   ALKPHOS 131 05/15/2014 1021   ALKPHOS 124* 05/14/2014 2233   BILITOT 0.28 05/15/2014 1021   BILITOT 0.2* 05/14/2014 2233   GFRNONAA 65* 05/14/2014 2233   GFRNONAA 44* 05/26/2013 1703   GFRAA 76* 05/14/2014 2233   GFRAA 51* 05/26/2013 1703    No results found for this basename: SPEP, UPEP,  kappa and lambda light chains    Lab Results  Component Value Date  WBC 4.5 05/15/2014   NEUTROABS 2.7 05/15/2014   HGB 12.9 05/15/2014   HCT 39.8 05/15/2014   MCV 84.8 05/15/2014   PLT 200 05/15/2014      Chemistry      Component Value Date/Time   NA 143 05/15/2014 1021   NA 142 05/14/2014 2233   K 3.5 05/15/2014 1021   K 3.8 05/14/2014 2233   CL 99 05/14/2014 2233   CO2 30* 05/15/2014 1021   CO2 29 05/14/2014 2233   BUN 7.5 05/15/2014 1021   BUN 8 05/14/2014 2233   CREATININE 1.1 05/15/2014 1021   CREATININE 0.98 05/14/2014 2233   CREATININE 1.38* 05/26/2013 1703      Component Value Date/Time   CALCIUM 9.6 05/15/2014 1021   CALCIUM 9.1 05/14/2014 2233   ALKPHOS 131 05/15/2014 1021   ALKPHOS 124* 05/14/2014 2233   AST 30 05/15/2014 1021   AST 28 05/14/2014 2233   ALT 20 05/15/2014 1021   ALT 18 05/14/2014 2233   BILITOT 0.28 05/15/2014 1021   BILITOT 0.2* 05/14/2014 2233       RADIOGRAPHIC STUDIES: I reviewed her imaging study I have personally reviewed the radiological images as  listed and agreed with the findings in the report.  ASSESSMENT & PLAN:  Colon cancer Unfortunately, repeat CT scan showed widespread metastatic disease in the pelvis. Her prognosis is very poor as her cancer had progressed significantly in under 3 months since we gave her a chemotherapy break. The patient understood from now on, chemotherapy would be palliative in nature and it is unlikely that she could stop treatment and again in the future. I would like to restart her on modified FOLFOX chemotherapy. I plan to restart Avastin in the near future. The reason of not restarted back on Avastin today is due to concern for risk of perforation due to her chronic constipation issue.  Constipation She has chronic constipation and on multiple different medications that could cause constipation. Clinically, she does not have signs and symptoms of bowel obstruction. I recommend she continues laxatives therapy. I would not restart her Avastin until she has regular bowel movement.  TOBACCO ABUSE I spent some time counseling the patient the importance of tobacco cessation. She is currently not interested to quit now.   Neuropathy due to secondary diabetes She has chronic diabetic neuropathy and is on gabapentin. Previously, she had worsening neuropathy while on chemotherapy. I will modify the dose of oxaliplatin due to this  Opioid dependence She is dependent on chronic narcotics prescriptions for chronic pain. Some her for abdominal pain could be due to constipation or her disease. It is impossible to delineate the cause of each. She will continue pain medication prescribed progression per her pain clinic provider.   Orders Placed This Encounter  Procedures  . UA Protein, Dipstick - [facility]    Standing Status: Future     Number of Occurrences:      Standing Expiration Date: 06/26/2015   All questions were answered. The patient knows to call the clinic with any problems, questions or concerns.  No barriers to learning was detected. I spent 40 minutes counseling the patient face to face. The total time spent in the appointment was 55 minutes and more than 50% was on counseling and review of test results     [facility], [place], MD 05/22/2014 9:52 AM  ,

## 2014-05-22 NOTE — Assessment & Plan Note (Signed)
She is dependent on chronic narcotics prescriptions for chronic pain. Some her for abdominal pain could be due to constipation or her disease. It is impossible to delineate the cause of each. She will continue pain medication prescribed progression per her pain clinic provider.

## 2014-05-22 NOTE — Progress Notes (Signed)
Per Dr. Alvy Bimler, hold Avastin today; proceed with FOLFOX; use labs from 05/15/14.

## 2014-05-22 NOTE — Assessment & Plan Note (Signed)
Unfortunately, repeat CT scan showed widespread metastatic disease in the pelvis. Her prognosis is very poor as her cancer had progressed significantly in under 3 months since we gave her a chemotherapy break. The patient understood from now on, chemotherapy would be palliative in nature and it is unlikely that she could stop treatment and again in the future. I would like to restart her on modified FOLFOX chemotherapy. I plan to restart Avastin in the near future. The reason of not restarted back on Avastin today is due to concern for risk of perforation due to her chronic constipation issue.

## 2014-05-22 NOTE — Assessment & Plan Note (Signed)
She has chronic diabetic neuropathy and is on gabapentin. Previously, she had worsening neuropathy while on chemotherapy. I will modify the dose of oxaliplatin due to this

## 2014-05-22 NOTE — Patient Instructions (Signed)
[facility] Discharge Instructions for Patients Receiving Chemotherapy  Today you received the following chemotherapy agents oxaliplatin/leucovorin/fluorouracil  To help prevent nausea and vomiting after your treatment, we encourage you to take your nausea medication as directed If you develop nausea and vomiting that is not controlled by your nausea medication, call the clinic.   BELOW ARE SYMPTOMS THAT SHOULD BE REPORTED IMMEDIATELY:  *FEVER GREATER THAN 100.5 F  *CHILLS WITH OR WITHOUT FEVER  NAUSEA AND VOMITING THAT IS NOT CONTROLLED WITH YOUR NAUSEA MEDICATION  *UNUSUAL SHORTNESS OF BREATH  *UNUSUAL BRUISING OR BLEEDING  TENDERNESS IN MOUTH AND THROAT WITH OR WITHOUT PRESENCE OF ULCERS  *URINARY PROBLEMS  *BOWEL PROBLEMS  UNUSUAL RASH Items with * indicate a potential emergency and should be followed up as soon as possible.  Feel free to call the clinic you have any questions or concerns. The clinic phone number is [phone].  

## 2014-05-22 NOTE — Telephone Encounter (Signed)
gv and printed appt sched and avs for pt for OCT and NOV....sed added tx. °

## 2014-05-24 ENCOUNTER — Ambulatory Visit (HOSPITAL_BASED_OUTPATIENT_CLINIC_OR_DEPARTMENT_OTHER): Payer: Medicaid Other

## 2014-05-24 ENCOUNTER — Telehealth: Payer: Self-pay | Admitting: *Deleted

## 2014-05-24 VITALS — BP 113/65 | HR 66 | Temp 97.8°F | Resp 20

## 2014-05-24 DIAGNOSIS — C182 Malignant neoplasm of ascending colon: Secondary | ICD-10-CM

## 2014-05-24 DIAGNOSIS — C189 Malignant neoplasm of colon, unspecified: Secondary | ICD-10-CM

## 2014-05-24 DIAGNOSIS — C787 Secondary malignant neoplasm of liver and intrahepatic bile duct: Secondary | ICD-10-CM

## 2014-05-24 DIAGNOSIS — C786 Secondary malignant neoplasm of retroperitoneum and peritoneum: Secondary | ICD-10-CM

## 2014-05-24 MED ORDER — SODIUM CHLORIDE 0.9 % IJ SOLN
10.0000 mL | INTRAMUSCULAR | Status: DC | PRN
  Administered 2014-05-24: 10 mL
  Filled 2014-05-24: qty 10

## 2014-05-24 MED ORDER — HEPARIN SOD (PORK) LOCK FLUSH 100 UNIT/ML IV SOLN
500.0000 [IU] | Freq: Once | INTRAVENOUS | Status: AC | PRN
  Administered 2014-05-24: 500 [IU]
  Filled 2014-05-24: qty 5

## 2014-05-24 NOTE — Telephone Encounter (Signed)
Pt left VM states has some questions for Dr. Alvy Bimler she forgot to ask her on her appt earlier this week.  Pt asks "How big are the tumors now?"  She also asks what her life expectancy is, "10 yrs? 5 yrs?" Pt asks if Dr. Alvy Bimler will call her to discuss.

## 2014-05-24 NOTE — Patient Instructions (Signed)

## 2014-05-25 ENCOUNTER — Telehealth: Payer: Self-pay | Admitting: Hematology and Oncology

## 2014-05-25 NOTE — Telephone Encounter (Signed)
I spoke with the patient over the telephone and discussed prognosis. We stage IV metastatic colon cancer, on treatment, overall survival is 2 years.

## 2014-06-02 ENCOUNTER — Other Ambulatory Visit: Payer: Self-pay | Admitting: Hematology and Oncology

## 2014-06-05 ENCOUNTER — Ambulatory Visit (HOSPITAL_BASED_OUTPATIENT_CLINIC_OR_DEPARTMENT_OTHER): Payer: Medicaid Other | Admitting: Hematology and Oncology

## 2014-06-05 ENCOUNTER — Encounter: Payer: Self-pay | Admitting: Hematology and Oncology

## 2014-06-05 ENCOUNTER — Telehealth: Payer: Self-pay | Admitting: Hematology and Oncology

## 2014-06-05 ENCOUNTER — Other Ambulatory Visit (HOSPITAL_BASED_OUTPATIENT_CLINIC_OR_DEPARTMENT_OTHER): Payer: Medicaid Other

## 2014-06-05 ENCOUNTER — Telehealth: Payer: Self-pay | Admitting: *Deleted

## 2014-06-05 ENCOUNTER — Ambulatory Visit (HOSPITAL_BASED_OUTPATIENT_CLINIC_OR_DEPARTMENT_OTHER): Payer: Medicaid Other

## 2014-06-05 VITALS — BP 130/84 | HR 73 | Temp 97.5°F | Resp 18 | Ht 65.0 in | Wt 164.9 lb

## 2014-06-05 DIAGNOSIS — C787 Secondary malignant neoplasm of liver and intrahepatic bile duct: Secondary | ICD-10-CM

## 2014-06-05 DIAGNOSIS — Z5112 Encounter for antineoplastic immunotherapy: Secondary | ICD-10-CM

## 2014-06-05 DIAGNOSIS — E1340 Other specified diabetes mellitus with diabetic neuropathy, unspecified: Secondary | ICD-10-CM

## 2014-06-05 DIAGNOSIS — C189 Malignant neoplasm of colon, unspecified: Secondary | ICD-10-CM

## 2014-06-05 DIAGNOSIS — C182 Malignant neoplasm of ascending colon: Secondary | ICD-10-CM

## 2014-06-05 DIAGNOSIS — K5909 Other constipation: Secondary | ICD-10-CM

## 2014-06-05 DIAGNOSIS — N183 Chronic kidney disease, stage 3 unspecified: Secondary | ICD-10-CM

## 2014-06-05 DIAGNOSIS — Z720 Tobacco use: Secondary | ICD-10-CM

## 2014-06-05 DIAGNOSIS — C7989 Secondary malignant neoplasm of other specified sites: Secondary | ICD-10-CM

## 2014-06-05 DIAGNOSIS — Z5111 Encounter for antineoplastic chemotherapy: Secondary | ICD-10-CM

## 2014-06-05 DIAGNOSIS — F17200 Nicotine dependence, unspecified, uncomplicated: Secondary | ICD-10-CM

## 2014-06-05 DIAGNOSIS — F1120 Opioid dependence, uncomplicated: Secondary | ICD-10-CM

## 2014-06-05 LAB — CBC WITH DIFFERENTIAL/PLATELET
BASO%: 0.5 % (ref 0.0–2.0)
BASOS ABS: 0 10*3/uL (ref 0.0–0.1)
EOS ABS: 0.1 10*3/uL (ref 0.0–0.5)
EOS%: 1.6 % (ref 0.0–7.0)
HEMATOCRIT: 41 % (ref 34.8–46.6)
HGB: 13.3 g/dL (ref 11.6–15.9)
LYMPH%: 20.9 % (ref 14.0–49.7)
MCH: 27.5 pg (ref 25.1–34.0)
MCHC: 32.4 g/dL (ref 31.5–36.0)
MCV: 84.9 fL (ref 79.5–101.0)
MONO#: 0.3 10*3/uL (ref 0.1–0.9)
MONO%: 8 % (ref 0.0–14.0)
NEUT%: 69 % (ref 38.4–76.8)
NEUTROS ABS: 2.6 10*3/uL (ref 1.5–6.5)
PLATELETS: 134 10*3/uL — AB (ref 145–400)
RBC: 4.83 10*6/uL (ref 3.70–5.45)
RDW: 13.6 % (ref 11.2–14.5)
WBC: 3.7 10*3/uL — ABNORMAL LOW (ref 3.9–10.3)
lymph#: 0.8 10*3/uL — ABNORMAL LOW (ref 0.9–3.3)

## 2014-06-05 LAB — COMPREHENSIVE METABOLIC PANEL (CC13)
ALBUMIN: 3.7 g/dL (ref 3.5–5.0)
ALT: 20 U/L (ref 0–55)
AST: 19 U/L (ref 5–34)
Alkaline Phosphatase: 139 U/L (ref 40–150)
Anion Gap: 11 mEq/L (ref 3–11)
BUN: 15.5 mg/dL (ref 7.0–26.0)
CO2: 28 mEq/L (ref 22–29)
Calcium: 9.7 mg/dL (ref 8.4–10.4)
Chloride: 101 mEq/L (ref 98–109)
Creatinine: 1.3 mg/dL — ABNORMAL HIGH (ref 0.6–1.1)
Glucose: 423 mg/dl — ABNORMAL HIGH (ref 70–140)
POTASSIUM: 3.9 meq/L (ref 3.5–5.1)
Sodium: 139 mEq/L (ref 136–145)
Total Bilirubin: 0.42 mg/dL (ref 0.20–1.20)
Total Protein: 7.7 g/dL (ref 6.4–8.3)

## 2014-06-05 LAB — UA PROTEIN, DIPSTICK - CHCC: Protein, ur: NEGATIVE mg/dL

## 2014-06-05 MED ORDER — DEXTROSE 5 % IV SOLN
Freq: Once | INTRAVENOUS | Status: AC
  Administered 2014-06-05: 12:00:00 via INTRAVENOUS

## 2014-06-05 MED ORDER — SODIUM CHLORIDE 0.9 % IV SOLN
5.0000 mg/kg | Freq: Once | INTRAVENOUS | Status: AC
  Administered 2014-06-05: 375 mg via INTRAVENOUS
  Filled 2014-06-05: qty 15

## 2014-06-05 MED ORDER — DEXAMETHASONE SODIUM PHOSPHATE 10 MG/ML IJ SOLN
10.0000 mg | Freq: Once | INTRAMUSCULAR | Status: AC
  Administered 2014-06-05: 10 mg via INTRAVENOUS

## 2014-06-05 MED ORDER — SODIUM CHLORIDE 0.9 % IV SOLN
2400.0000 mg/m2 | INTRAVENOUS | Status: DC
  Administered 2014-06-05: 4150 mg via INTRAVENOUS
  Filled 2014-06-05: qty 83

## 2014-06-05 MED ORDER — ONDANSETRON 8 MG/NS 50 ML IVPB
INTRAVENOUS | Status: AC
  Filled 2014-06-05: qty 8

## 2014-06-05 MED ORDER — LEUCOVORIN CALCIUM INJECTION 350 MG
400.0000 mg/m2 | Freq: Once | INTRAVENOUS | Status: AC
  Administered 2014-06-05: 692 mg via INTRAVENOUS
  Filled 2014-06-05: qty 34.6

## 2014-06-05 MED ORDER — DEXAMETHASONE SODIUM PHOSPHATE 10 MG/ML IJ SOLN
INTRAMUSCULAR | Status: AC
  Filled 2014-06-05: qty 1

## 2014-06-05 MED ORDER — OXALIPLATIN CHEMO INJECTION 100 MG/20ML
50.0000 mg/m2 | Freq: Once | INTRAVENOUS | Status: AC
  Administered 2014-06-05: 85 mg via INTRAVENOUS
  Filled 2014-06-05: qty 17

## 2014-06-05 MED ORDER — ONDANSETRON 8 MG/50ML IVPB (CHCC)
8.0000 mg | Freq: Once | INTRAVENOUS | Status: AC
  Administered 2014-06-05: 8 mg via INTRAVENOUS

## 2014-06-05 MED ORDER — SODIUM CHLORIDE 0.9 % IV SOLN
Freq: Once | INTRAVENOUS | Status: AC
  Administered 2014-06-05: 11:00:00 via INTRAVENOUS

## 2014-06-05 NOTE — Telephone Encounter (Signed)
Per staff message and POF I have scheduled appts. Advised scheduler of appts. JMW  

## 2014-06-05 NOTE — Patient Instructions (Signed)
[facility] Discharge Instructions for Patients Receiving Chemotherapy  Today you received the following chemotherapy agents: avastin, oxaliplatin, leucovorin, 5 FU  To help prevent nausea and vomiting after your treatment, we encourage you to take your nausea medication as prescribed.    If you develop nausea and vomiting that is not controlled by your nausea medication, call the clinic.   BELOW ARE SYMPTOMS THAT SHOULD BE REPORTED IMMEDIATELY:  *FEVER GREATER THAN 100.5 F  *CHILLS WITH OR WITHOUT FEVER  NAUSEA AND VOMITING THAT IS NOT CONTROLLED WITH YOUR NAUSEA MEDICATION  *UNUSUAL SHORTNESS OF BREATH  *UNUSUAL BRUISING OR BLEEDING  TENDERNESS IN MOUTH AND THROAT WITH OR WITHOUT PRESENCE OF ULCERS  *URINARY PROBLEMS  *BOWEL PROBLEMS  UNUSUAL RASH Items with * indicate a potential emergency and should be followed up as soon as possible.  Feel free to call the clinic you have any questions or concerns. The clinic phone number is [phone].

## 2014-06-05 NOTE — Telephone Encounter (Signed)
Gave avs & cal for Nov. Sent mess to sch tx. °

## 2014-06-05 NOTE — Assessment & Plan Note (Signed)
She has chronic diabetic neuropathy and is on gabapentin. Previously, she had worsening neuropathy while on chemotherapy. I will modify the dose of oxaliplatin due to this

## 2014-06-05 NOTE — Assessment & Plan Note (Signed)
She is dependent on chronic narcotics prescriptions for chronic pain. She will continue pain medication prescribed progression per her pain clinic provider.

## 2014-06-05 NOTE — Assessment & Plan Note (Signed)
Unfortunately, repeat CT scan showed widespread metastatic disease in the pelvis. Her prognosis is very poor as her cancer had progressed significantly in under 3 months since we gave her a chemotherapy break. The patient understood from now on, chemotherapy would be palliative in nature and it is unlikely that she could stop treatment and again in the future. She was started on modified FOLFOX chemotherapy and have clinical improvement. I will restart Avastin today. My plan would be to order 4 cycles of treatment before repeat imaging. Since she is clinically improving, I will see her at the end of the month but she will come back every 2 weeks for treatment.

## 2014-06-05 NOTE — Assessment & Plan Note (Signed)
Her kidney function tests is unaffected by recent chemotherapy. Continue close observation.   

## 2014-06-05 NOTE — Progress Notes (Signed)
[facility] OFFICE PROGRESS NOTE  Patient Care Team: Elberta Leatherwood, MD as PCP - General Windy Kalata, MD as Consulting Physician (Nephrology) Leatrice Jewels Rayetta Pigg, MD as Consulting Physician (Psychiatry) Heath Lark, MD as Consulting Physician (Hematology and Oncology)  SUMMARY OF ONCOLOGIC HISTORY: Oncology History   Colon cancer, KRAS positive   Primary site: Colon and Rectum (Right)   Staging method: AJCC 7th Edition   Clinical: (T4b, N2b, M1) signed by Heath Lark, MD on 08/05/2013  9:52 AM   Pathologic: Stage IVB (T4b, N2b, M1b) signed by Heath Lark, MD on 08/05/2013 10:21 AM   Summary: Stage IVB (T4b, N2b, M1b)      Colon cancer   07/15/2013 - 07/25/2013 Hospital Admission The patient presented to the hospital with pain in her abdomen and underwent surgery for colon cancer   07/15/2013 Imaging CT scan of the abdomen show bowel dilatation suspicious for malignancy   07/17/2013 Tumor Marker Preoperative CEA was 2.2, normal   07/19/2013 Procedure Colonoscopy revealed ascending colon lesion with significant stricture and biopsy showed adenocarcinoma   07/20/2013 Surgery She underwent right hemicolectomy, cholecystectomy, liver biopsy, excision of abdominal wall mass and omental mass. During surgery, she was also found to have drop metastasis   08/29/2013 Imaging Repeat CT scan show persistent liver metastasis and new omental metastasis   08/31/2013 - 02/10/2014 Chemotherapy The patient was started on palliative chemotherapy with FOLFOX, bolus 5-FU committed and oxaliplatin dose reduced due to  peripheral neuropathy. She has completed 12 cycles of treatment.   11/22/2013 Imaging Repeat CT scan showed near complete response to treatment.   11/23/2013 - 02/10/2014 Chemotherapy Avastin was added to her chemotherapy regimen   12/21/2013 Adverse Reaction Oxaliplatin was omitted due to worsening peripheral neuropathy.   02/20/2014 Imaging Repeat CT scan showed near complete  resolution of all disease.   05/15/2014 Imaging Repeat CT scan showed peritoneal metastasis and recurrence of disease   05/22/2014 -  Chemotherapy Modify dosings of FOLFOX was restarted.    INTERVAL HISTORY: Please see below for problem oriented charting. She is seen prior to cycle 2 of treatment. Since treatment, her abdominal pain has improved. Constipation is resolving. She complained of mild vaginal discharge. Her peripheral neuropathy is stable. She is attempting to quit smoking. She denies side effects from recent treatment.  REVIEW OF SYSTEMS:   Constitutional: Denies fevers, chills or abnormal weight loss Eyes: Denies blurriness of vision Ears, nose, mouth, throat, and face: Denies mucositis or sore throat Respiratory: Denies cough, dyspnea or wheezes Cardiovascular: Denies palpitation, chest discomfort or lower extremity swelling Gastrointestinal:  Denies nausea, heartburn or change in bowel habits Skin: Denies abnormal skin rashes Lymphatics: Denies new lymphadenopathy or easy bruising Neurological:Denies numbness, tingling or new weaknesses Behavioral/Psych: Mood is stable, no new changes  All other systems were reviewed with the patient and are negative.  I have reviewed the past medical history, past surgical history, social history and family history with the patient and they are unchanged from previous note.  ALLERGIES:  is allergic to naproxen.  MEDICATIONS:  Current Outpatient Prescriptions  Medication Sig Dispense Refill  . atorvastatin (LIPITOR) 40 MG tablet Take 40 mg by mouth daily.    . Buprenorphine HCl-Naloxone HCl (SUBOXONE SL) Place 8 mg under the tongue 3 (three) times daily.    . clonazePAM (KLONOPIN) 1 MG tablet Take 1 mg by mouth 3 (three) times daily.     . furosemide (LASIX) 40 MG tablet Take 1 tablet (40 mg total)  by mouth 2 (two) times daily. 60 tablet 5  . gabapentin (NEURONTIN) 800 MG tablet Take 800 mg by mouth 3 (three) times daily.    Marland Kitchen  HYDROXYZINE PAMOATE PO Take 25 mg by mouth 3 (three) times daily.    . insulin glargine (LANTUS) 100 UNIT/ML injection Inject 27 Units into the skin daily.    . insulin lispro (HUMALOG) 100 UNIT/ML injection Inject 1-10 Units into the skin 3 (three) times daily before meals.    . lamoTRIgine (LAMICTAL) 100 MG tablet Take 200 mg by mouth every morning.     . lidocaine-prilocaine (EMLA) cream Apply 1 application topically as needed (for pain).    Marland Kitchen omeprazole (PRILOSEC) 20 MG capsule Take 20 mg by mouth every morning.    . polyethylene glycol powder (GLYCOLAX/MIRALAX) powder Take 17 g by mouth 2 (two) times daily as needed. 3350 g 3  . senna-docusate (SENOKOT-S) 8.6-50 MG per tablet Take 1 tablet by mouth 2 (two) times daily. 90 tablet 3  . ziprasidone (GEODON) 80 MG capsule Take 160 mg by mouth at bedtime.      No current facility-administered medications for this visit.   Facility-Administered Medications Ordered in Other Visits  Medication Dose Route Frequency Provider Last Rate Last Dose  . 0.9 %  sodium chloride infusion   Intravenous Once Heath Lark, MD      . bevacizumab (AVASTIN) 375 mg in sodium chloride 0.9 % 100 mL chemo infusion  5 mg/kg (Treatment Plan Actual) Intravenous Once Heath Lark, MD      . dextrose 5 % solution   Intravenous Once Heath Lark, MD      . fluorouracil (ADRUCIL) 4,150 mg in sodium chloride 0.9 % 150 mL chemo infusion  2,400 mg/m2 (Treatment Plan Adjusted) Intravenous 1 day or 1 dose Heath Lark, MD      . leucovorin 692 mg in dextrose 5 % 250 mL infusion  400 mg/m2 (Treatment Plan Adjusted) Intravenous Once Heath Lark, MD      . ondansetron (ZOFRAN) IVPB 8 mg  8 mg Intravenous Once Heath Lark, MD   8 mg at 06/05/14 1102  . oxaliplatin (ELOXATIN) 85 mg in dextrose 5 % 500 mL chemo infusion  50 mg/m2 (Treatment Plan Adjusted) Intravenous Once Heath Lark, MD        PHYSICAL EXAMINATION: ECOG PERFORMANCE STATUS: 0 - Asymptomatic  Filed Vitals:   06/05/14 1007   BP: 130/84  Pulse: 73  Temp: 97.5 F (36.4 C)  Resp: 18   Filed Weights   06/05/14 1007  Weight: 164 lb 14.4 oz (74.798 kg)    GENERAL:alert, no distress and comfortable SKIN: skin color, texture, turgor are normal, no rashes or significant lesions EYES: normal, Conjunctiva are pink and non-injected, sclera clear OROPHARYNX:no exudate, no erythema and lips, buccal mucosa, and tongue normal  NECK: supple, thyroid normal size, non-tender, without nodularity LYMPH:  no palpable lymphadenopathy in the cervical, axillary or inguinal LUNGS: clear to auscultation and percussion with normal breathing effort HEART: regular rate & rhythm and no murmurs and no lower extremity edema ABDOMEN:abdomen soft, non-tender and normal bowel sounds Musculoskeletal:no cyanosis of digits and no clubbing  NEURO: alert & oriented x 3 with fluent speech, no focal motor/sensory deficits  LABORATORY DATA:  I have reviewed the data as listed    Component Value Date/Time   NA 139 06/05/2014 0945   NA 142 05/14/2014 2233   K 3.9 06/05/2014 0945   K 3.8 05/14/2014 2233   CL 99  05/14/2014 2233   CO2 28 06/05/2014 0945   CO2 29 05/14/2014 2233   GLUCOSE 423* 06/05/2014 0945   GLUCOSE 88 05/14/2014 2233   BUN 15.5 06/05/2014 0945   BUN 8 05/14/2014 2233   CREATININE 1.3* 06/05/2014 0945   CREATININE 0.98 05/14/2014 2233   CREATININE 1.38* 05/26/2013 1703   CALCIUM 9.7 06/05/2014 0945   CALCIUM 9.1 05/14/2014 2233   PROT 7.7 06/05/2014 0945   PROT 7.1 05/14/2014 2233   ALBUMIN 3.7 06/05/2014 0945   ALBUMIN 3.1* 05/14/2014 2233   AST 19 06/05/2014 0945   AST 28 05/14/2014 2233   ALT 20 06/05/2014 0945   ALT 18 05/14/2014 2233   ALKPHOS 139 06/05/2014 0945   ALKPHOS 124* 05/14/2014 2233   BILITOT 0.42 06/05/2014 0945   BILITOT 0.2* 05/14/2014 2233   GFRNONAA 65* 05/14/2014 2233   GFRNONAA 44* 05/26/2013 1703   GFRAA 76* 05/14/2014 2233   GFRAA 51* 05/26/2013 1703    No results found for:  SPEP, UPEP  Lab Results  Component Value Date   WBC 3.7* 06/05/2014   NEUTROABS 2.6 06/05/2014   HGB 13.3 06/05/2014   HCT 41.0 06/05/2014   MCV 84.9 06/05/2014   PLT 134* 06/05/2014      Chemistry      Component Value Date/Time   NA 139 06/05/2014 0945   NA 142 05/14/2014 2233   K 3.9 06/05/2014 0945   K 3.8 05/14/2014 2233   CL 99 05/14/2014 2233   CO2 28 06/05/2014 0945   CO2 29 05/14/2014 2233   BUN 15.5 06/05/2014 0945   BUN 8 05/14/2014 2233   CREATININE 1.3* 06/05/2014 0945   CREATININE 0.98 05/14/2014 2233   CREATININE 1.38* 05/26/2013 1703      Component Value Date/Time   CALCIUM 9.7 06/05/2014 0945   CALCIUM 9.1 05/14/2014 2233   ALKPHOS 139 06/05/2014 0945   ALKPHOS 124* 05/14/2014 2233   AST 19 06/05/2014 0945   AST 28 05/14/2014 2233   ALT 20 06/05/2014 0945   ALT 18 05/14/2014 2233   BILITOT 0.42 06/05/2014 0945   BILITOT 0.2* 05/14/2014 2233      ASSESSMENT & PLAN:  Colon cancer Unfortunately, repeat CT scan showed widespread metastatic disease in the pelvis. Her prognosis is very poor as her cancer had progressed significantly in under 3 months since we gave her a chemotherapy break. The patient understood from now on, chemotherapy would be palliative in nature and it is unlikely that she could stop treatment and again in the future. She was started on modified FOLFOX chemotherapy and have clinical improvement. I will restart Avastin today. My plan would be to order 4 cycles of treatment before repeat imaging. Since she is clinically improving, I will see her at the end of the month but she will come back every 2 weeks for treatment.   Constipation Clinically, she is responding to treatment and her bowel habits have become regular with the use of magnesium citrate daily. We will continue the same.  Neuropathy due to secondary diabetes She has chronic diabetic neuropathy and is on gabapentin. Previously, she had worsening neuropathy while on  chemotherapy. I will modify the dose of oxaliplatin due to this    Opioid dependence She is dependent on chronic narcotics prescriptions for chronic pain. She will continue pain medication prescribed progression per her pain clinic provider.    TOBACCO ABUSE I spent some time counseling the patient the importance of tobacco cessation. She is attempting to quit  now.   CKD (chronic kidney disease), stage III Her kidney function tests is unaffected by recent chemotherapy. Continue close observation.  Vaginal discharge is likely related to response to treatment. Her most recent CT scan showed pelvic involvement. I reassured the patient.  Orders Placed This Encounter  Procedures  . CEA    Standing Status: Future     Number of Occurrences:      Standing Expiration Date: 07/10/2015   All questions were answered. The patient knows to call the clinic with any problems, questions or concerns. No barriers to learning was detected. I spent 30 minutes counseling the patient face to face. The total time spent in the appointment was 40 minutes and more than 50% was on counseling and review of test results     [facility], [place], MD 06/05/2014 11:16 AM

## 2014-06-05 NOTE — Assessment & Plan Note (Signed)
I spent some time counseling the patient the importance of tobacco cessation. She is attempting to quit now.

## 2014-06-05 NOTE — Assessment & Plan Note (Signed)
Clinically, she is responding to treatment and her bowel habits have become regular with the use of magnesium citrate daily. We will continue the same.

## 2014-06-07 ENCOUNTER — Ambulatory Visit (HOSPITAL_BASED_OUTPATIENT_CLINIC_OR_DEPARTMENT_OTHER): Payer: Medicaid Other

## 2014-06-07 DIAGNOSIS — C189 Malignant neoplasm of colon, unspecified: Secondary | ICD-10-CM

## 2014-06-07 MED ORDER — HEPARIN SOD (PORK) LOCK FLUSH 100 UNIT/ML IV SOLN
500.0000 [IU] | Freq: Once | INTRAVENOUS | Status: AC | PRN
  Administered 2014-06-07: 500 [IU]
  Filled 2014-06-07: qty 5

## 2014-06-07 MED ORDER — SODIUM CHLORIDE 0.9 % IJ SOLN
10.0000 mL | INTRAMUSCULAR | Status: DC | PRN
  Administered 2014-06-07: 10 mL
  Filled 2014-06-07: qty 10

## 2014-06-07 NOTE — Patient Instructions (Signed)

## 2014-06-19 ENCOUNTER — Encounter: Payer: Medicaid Other | Admitting: Nurse Practitioner

## 2014-06-19 ENCOUNTER — Other Ambulatory Visit (HOSPITAL_BASED_OUTPATIENT_CLINIC_OR_DEPARTMENT_OTHER): Payer: Medicaid Other

## 2014-06-19 ENCOUNTER — Ambulatory Visit (HOSPITAL_BASED_OUTPATIENT_CLINIC_OR_DEPARTMENT_OTHER): Payer: Medicaid Other

## 2014-06-19 ENCOUNTER — Other Ambulatory Visit: Payer: Self-pay | Admitting: Nurse Practitioner

## 2014-06-19 DIAGNOSIS — C182 Malignant neoplasm of ascending colon: Secondary | ICD-10-CM

## 2014-06-19 DIAGNOSIS — Z5111 Encounter for antineoplastic chemotherapy: Secondary | ICD-10-CM

## 2014-06-19 DIAGNOSIS — C189 Malignant neoplasm of colon, unspecified: Secondary | ICD-10-CM

## 2014-06-19 DIAGNOSIS — Z5112 Encounter for antineoplastic immunotherapy: Secondary | ICD-10-CM

## 2014-06-19 DIAGNOSIS — C7989 Secondary malignant neoplasm of other specified sites: Secondary | ICD-10-CM

## 2014-06-19 LAB — COMPREHENSIVE METABOLIC PANEL (CC13)
ALT: 26 U/L (ref 0–55)
ANION GAP: 12 meq/L — AB (ref 3–11)
AST: 29 U/L (ref 5–34)
Albumin: 3.7 g/dL (ref 3.5–5.0)
Alkaline Phosphatase: 128 U/L (ref 40–150)
BUN: 8.3 mg/dL (ref 7.0–26.0)
CALCIUM: 9.9 mg/dL (ref 8.4–10.4)
CO2: 30 mEq/L — ABNORMAL HIGH (ref 22–29)
CREATININE: 1.2 mg/dL — AB (ref 0.6–1.1)
Chloride: 101 mEq/L (ref 98–109)
Glucose: 146 mg/dl — ABNORMAL HIGH (ref 70–140)
Potassium: 3.7 mEq/L (ref 3.5–5.1)
Sodium: 143 mEq/L (ref 136–145)
Total Bilirubin: 0.76 mg/dL (ref 0.20–1.20)
Total Protein: 7.6 g/dL (ref 6.4–8.3)

## 2014-06-19 LAB — CBC WITH DIFFERENTIAL/PLATELET
BASO%: 0.4 % (ref 0.0–2.0)
BASOS ABS: 0 10*3/uL (ref 0.0–0.1)
EOS ABS: 0.1 10*3/uL (ref 0.0–0.5)
EOS%: 1.5 % (ref 0.0–7.0)
HEMATOCRIT: 40 % (ref 34.8–46.6)
HGB: 13.2 g/dL (ref 11.6–15.9)
LYMPH#: 0.9 10*3/uL (ref 0.9–3.3)
LYMPH%: 19.5 % (ref 14.0–49.7)
MCH: 28 pg (ref 25.1–34.0)
MCHC: 33 g/dL (ref 31.5–36.0)
MCV: 84.9 fL (ref 79.5–101.0)
MONO#: 0.4 10*3/uL (ref 0.1–0.9)
MONO%: 8.8 % (ref 0.0–14.0)
NEUT%: 69.8 % (ref 38.4–76.8)
NEUTROS ABS: 3.3 10*3/uL (ref 1.5–6.5)
Platelets: 150 10*3/uL (ref 145–400)
RBC: 4.71 10*6/uL (ref 3.70–5.45)
RDW: 14.7 % — ABNORMAL HIGH (ref 11.2–14.5)
WBC: 4.8 10*3/uL (ref 3.9–10.3)

## 2014-06-19 LAB — CEA: CEA: 6.4 ng/mL — ABNORMAL HIGH (ref 0.0–5.0)

## 2014-06-19 MED ORDER — OXALIPLATIN CHEMO INJECTION 100 MG/20ML
50.0000 mg/m2 | Freq: Once | INTRAVENOUS | Status: AC
  Administered 2014-06-19: 85 mg via INTRAVENOUS
  Filled 2014-06-19: qty 17

## 2014-06-19 MED ORDER — DEXTROSE 5 % IV SOLN
Freq: Once | INTRAVENOUS | Status: AC
  Administered 2014-06-19: 14:00:00 via INTRAVENOUS

## 2014-06-19 MED ORDER — ONDANSETRON 8 MG/50ML IVPB (CHCC)
8.0000 mg | Freq: Once | INTRAVENOUS | Status: AC
  Administered 2014-06-19: 8 mg via INTRAVENOUS

## 2014-06-19 MED ORDER — ONDANSETRON 8 MG/NS 50 ML IVPB
INTRAVENOUS | Status: AC
  Filled 2014-06-19: qty 8

## 2014-06-19 MED ORDER — SODIUM CHLORIDE 0.9 % IV SOLN
5.0000 mg/kg | Freq: Once | INTRAVENOUS | Status: AC
  Administered 2014-06-19: 375 mg via INTRAVENOUS
  Filled 2014-06-19: qty 15

## 2014-06-19 MED ORDER — DEXAMETHASONE SODIUM PHOSPHATE 10 MG/ML IJ SOLN
10.0000 mg | Freq: Once | INTRAMUSCULAR | Status: AC
  Administered 2014-06-19: 10 mg via INTRAVENOUS

## 2014-06-19 MED ORDER — LEUCOVORIN CALCIUM INJECTION 350 MG
400.0000 mg/m2 | Freq: Once | INTRAVENOUS | Status: AC
  Administered 2014-06-19: 692 mg via INTRAVENOUS
  Filled 2014-06-19: qty 34.6

## 2014-06-19 MED ORDER — FLUOROURACIL CHEMO INJECTION 5 GM/100ML
2400.0000 mg/m2 | INTRAVENOUS | Status: DC
  Administered 2014-06-19: 4150 mg via INTRAVENOUS
  Filled 2014-06-19: qty 83

## 2014-06-19 MED ORDER — DEXAMETHASONE SODIUM PHOSPHATE 10 MG/ML IJ SOLN
INTRAMUSCULAR | Status: AC
Start: 2014-06-19 — End: 2014-06-19
  Filled 2014-06-19: qty 1

## 2014-06-19 NOTE — Progress Notes (Signed)
Upon assessment it was noted patient has a flat effect, unable to focus, has difficulty answering questions, is unable to stay awake and ambulates with slow and deliberate steps. Notified Cindy, NP, to assess patient's condition before proceeding with treatment. Please refer to MD's progress notes for pt diagnosis.  Per Retta Mac, NP/ Dr.Goresuch may proceed with treatment.

## 2014-06-19 NOTE — Patient Instructions (Signed)
[facility] Discharge Instructions for Patients Receiving Chemotherapy  Today you received the following chemotherapy agents Leucovorin, 5FU, Avastin and Oxaliplatin.  To help prevent nausea and vomiting after your treatment, we encourage you to take your nausea medication as prescribed.   If you develop nausea and vomiting that is not controlled by your nausea medication, call the clinic.   BELOW ARE SYMPTOMS THAT SHOULD BE REPORTED IMMEDIATELY:  *FEVER GREATER THAN 100.5 F  *CHILLS WITH OR WITHOUT FEVER  NAUSEA AND VOMITING THAT IS NOT CONTROLLED WITH YOUR NAUSEA MEDICATION  *UNUSUAL SHORTNESS OF BREATH  *UNUSUAL BRUISING OR BLEEDING  TENDERNESS IN MOUTH AND THROAT WITH OR WITHOUT PRESENCE OF ULCERS  *URINARY PROBLEMS  *BOWEL PROBLEMS  UNUSUAL RASH Items with * indicate a potential emergency and should be followed up as soon as possible.  Feel free to call the clinic you have any questions or concerns. The clinic phone number is [phone].

## 2014-06-21 ENCOUNTER — Ambulatory Visit (HOSPITAL_BASED_OUTPATIENT_CLINIC_OR_DEPARTMENT_OTHER): Payer: Medicaid Other

## 2014-06-21 VITALS — BP 122/70 | HR 63 | Temp 98.3°F | Resp 18

## 2014-06-21 DIAGNOSIS — C182 Malignant neoplasm of ascending colon: Secondary | ICD-10-CM

## 2014-06-21 DIAGNOSIS — Z95828 Presence of other vascular implants and grafts: Secondary | ICD-10-CM

## 2014-06-21 DIAGNOSIS — Z452 Encounter for adjustment and management of vascular access device: Secondary | ICD-10-CM

## 2014-06-21 DIAGNOSIS — C7989 Secondary malignant neoplasm of other specified sites: Secondary | ICD-10-CM

## 2014-06-21 MED ORDER — HEPARIN SOD (PORK) LOCK FLUSH 100 UNIT/ML IV SOLN
500.0000 [IU] | Freq: Once | INTRAVENOUS | Status: AC
  Administered 2014-06-21: 500 [IU] via INTRAVENOUS
  Filled 2014-06-21: qty 5

## 2014-06-21 MED ORDER — SODIUM CHLORIDE 0.9 % IJ SOLN
10.0000 mL | INTRAMUSCULAR | Status: DC | PRN
  Administered 2014-06-21: 10 mL via INTRAVENOUS
  Filled 2014-06-21: qty 10

## 2014-07-03 ENCOUNTER — Telehealth: Payer: Self-pay | Admitting: Hematology and Oncology

## 2014-07-03 ENCOUNTER — Telehealth: Payer: Self-pay | Admitting: *Deleted

## 2014-07-03 ENCOUNTER — Other Ambulatory Visit: Payer: Self-pay | Admitting: Hematology and Oncology

## 2014-07-03 ENCOUNTER — Ambulatory Visit (HOSPITAL_BASED_OUTPATIENT_CLINIC_OR_DEPARTMENT_OTHER): Payer: Medicaid Other | Admitting: Hematology and Oncology

## 2014-07-03 ENCOUNTER — Other Ambulatory Visit (HOSPITAL_BASED_OUTPATIENT_CLINIC_OR_DEPARTMENT_OTHER): Payer: Medicaid Other

## 2014-07-03 ENCOUNTER — Ambulatory Visit (HOSPITAL_BASED_OUTPATIENT_CLINIC_OR_DEPARTMENT_OTHER): Payer: Medicaid Other

## 2014-07-03 ENCOUNTER — Encounter: Payer: Self-pay | Admitting: Hematology and Oncology

## 2014-07-03 VITALS — BP 126/79 | HR 77 | Temp 97.5°F | Resp 18 | Ht 65.0 in | Wt 164.4 lb

## 2014-07-03 DIAGNOSIS — C787 Secondary malignant neoplasm of liver and intrahepatic bile duct: Secondary | ICD-10-CM

## 2014-07-03 DIAGNOSIS — C189 Malignant neoplasm of colon, unspecified: Secondary | ICD-10-CM

## 2014-07-03 DIAGNOSIS — C786 Secondary malignant neoplasm of retroperitoneum and peritoneum: Secondary | ICD-10-CM

## 2014-07-03 DIAGNOSIS — K5900 Constipation, unspecified: Secondary | ICD-10-CM

## 2014-07-03 DIAGNOSIS — C7989 Secondary malignant neoplasm of other specified sites: Secondary | ICD-10-CM

## 2014-07-03 DIAGNOSIS — T50905A Adverse effect of unspecified drugs, medicaments and biological substances, initial encounter: Secondary | ICD-10-CM

## 2014-07-03 DIAGNOSIS — E1340 Other specified diabetes mellitus with diabetic neuropathy, unspecified: Secondary | ICD-10-CM

## 2014-07-03 DIAGNOSIS — D6959 Other secondary thrombocytopenia: Secondary | ICD-10-CM

## 2014-07-03 DIAGNOSIS — C182 Malignant neoplasm of ascending colon: Secondary | ICD-10-CM

## 2014-07-03 DIAGNOSIS — K5909 Other constipation: Secondary | ICD-10-CM

## 2014-07-03 DIAGNOSIS — Z5111 Encounter for antineoplastic chemotherapy: Secondary | ICD-10-CM

## 2014-07-03 DIAGNOSIS — E1140 Type 2 diabetes mellitus with diabetic neuropathy, unspecified: Secondary | ICD-10-CM

## 2014-07-03 LAB — CBC WITH DIFFERENTIAL/PLATELET
BASO%: 0.6 % (ref 0.0–2.0)
Basophils Absolute: 0 10*3/uL (ref 0.0–0.1)
EOS ABS: 0.1 10*3/uL (ref 0.0–0.5)
EOS%: 2.1 % (ref 0.0–7.0)
HEMATOCRIT: 40.7 % (ref 34.8–46.6)
HGB: 13.6 g/dL (ref 11.6–15.9)
LYMPH#: 1 10*3/uL (ref 0.9–3.3)
LYMPH%: 30 % (ref 14.0–49.7)
MCH: 28.2 pg (ref 25.1–34.0)
MCHC: 33.4 g/dL (ref 31.5–36.0)
MCV: 84.4 fL (ref 79.5–101.0)
MONO#: 0.3 10*3/uL (ref 0.1–0.9)
MONO%: 10.3 % (ref 0.0–14.0)
NEUT%: 57 % (ref 38.4–76.8)
NEUTROS ABS: 1.9 10*3/uL (ref 1.5–6.5)
PLATELETS: 82 10*3/uL — AB (ref 145–400)
RBC: 4.82 10*6/uL (ref 3.70–5.45)
RDW: 15.2 % — ABNORMAL HIGH (ref 11.2–14.5)
WBC: 3.3 10*3/uL — AB (ref 3.9–10.3)
nRBC: 0 % (ref 0–0)

## 2014-07-03 LAB — COMPREHENSIVE METABOLIC PANEL (CC13)
ALT: 24 U/L (ref 0–55)
ANION GAP: 13 meq/L — AB (ref 3–11)
AST: 29 U/L (ref 5–34)
Albumin: 3.3 g/dL — ABNORMAL LOW (ref 3.5–5.0)
Alkaline Phosphatase: 101 U/L (ref 40–150)
BUN: 10.5 mg/dL (ref 7.0–26.0)
CO2: 27 meq/L (ref 22–29)
CREATININE: 1.2 mg/dL — AB (ref 0.6–1.1)
Calcium: 9.3 mg/dL (ref 8.4–10.4)
Chloride: 102 mEq/L (ref 98–109)
Glucose: 367 mg/dl — ABNORMAL HIGH (ref 70–140)
Potassium: 4 mEq/L (ref 3.5–5.1)
Sodium: 141 mEq/L (ref 136–145)
TOTAL PROTEIN: 6.8 g/dL (ref 6.4–8.3)
Total Bilirubin: 0.51 mg/dL (ref 0.20–1.20)

## 2014-07-03 MED ORDER — SODIUM CHLORIDE 0.9 % IV SOLN
2400.0000 mg/m2 | INTRAVENOUS | Status: DC
  Administered 2014-07-03: 4150 mg via INTRAVENOUS
  Filled 2014-07-03: qty 83

## 2014-07-03 MED ORDER — DEXAMETHASONE SODIUM PHOSPHATE 10 MG/ML IJ SOLN
10.0000 mg | Freq: Once | INTRAMUSCULAR | Status: AC
  Administered 2014-07-03: 10 mg via INTRAVENOUS

## 2014-07-03 MED ORDER — OXALIPLATIN CHEMO INJECTION 100 MG/20ML
50.0000 mg/m2 | Freq: Once | INTRAVENOUS | Status: AC
  Administered 2014-07-03: 85 mg via INTRAVENOUS
  Filled 2014-07-03: qty 17

## 2014-07-03 MED ORDER — DEXAMETHASONE SODIUM PHOSPHATE 10 MG/ML IJ SOLN
INTRAMUSCULAR | Status: AC
  Filled 2014-07-03: qty 1

## 2014-07-03 MED ORDER — ONDANSETRON 8 MG/NS 50 ML IVPB
INTRAVENOUS | Status: AC
  Filled 2014-07-03: qty 8

## 2014-07-03 MED ORDER — ONDANSETRON 8 MG/50ML IVPB (CHCC)
8.0000 mg | Freq: Once | INTRAVENOUS | Status: AC
  Administered 2014-07-03: 8 mg via INTRAVENOUS

## 2014-07-03 MED ORDER — DEXTROSE 5 % IV SOLN
Freq: Once | INTRAVENOUS | Status: AC
  Administered 2014-07-03: 13:00:00 via INTRAVENOUS

## 2014-07-03 MED ORDER — LEUCOVORIN CALCIUM INJECTION 350 MG
400.0000 mg/m2 | Freq: Once | INTRAVENOUS | Status: AC
  Administered 2014-07-03: 692 mg via INTRAVENOUS
  Filled 2014-07-03: qty 34.6

## 2014-07-03 NOTE — Patient Instructions (Signed)
[facility] Discharge Instructions for Patients Receiving Chemotherapy  Today you received the following chemotherapy agents: Oxaliplatin, Leucovorin, and Adrucil.  To help prevent nausea and vomiting after your treatment, we encourage you to take your nausea medication as prescribed.   If you develop nausea and vomiting that is not controlled by your nausea medication, call the clinic.   BELOW ARE SYMPTOMS THAT SHOULD BE REPORTED IMMEDIATELY:  *FEVER GREATER THAN 100.5 F  *CHILLS WITH OR WITHOUT FEVER  NAUSEA AND VOMITING THAT IS NOT CONTROLLED WITH YOUR NAUSEA MEDICATION  *UNUSUAL SHORTNESS OF BREATH  *UNUSUAL BRUISING OR BLEEDING  TENDERNESS IN MOUTH AND THROAT WITH OR WITHOUT PRESENCE OF ULCERS  *URINARY PROBLEMS  *BOWEL PROBLEMS  UNUSUAL RASH Items with * indicate a potential emergency and should be followed up as soon as possible.  Feel free to call the clinic you have any questions or concerns. The clinic phone number is [phone].    

## 2014-07-03 NOTE — Telephone Encounter (Signed)
Gave avs & cal forDec. Sent mess to sch tx. °

## 2014-07-03 NOTE — Progress Notes (Signed)
[place] OFFICE PROGRESS NOTE  Patient Care Team: Elberta Leatherwood, MD as PCP - General Windy Kalata, MD as Consulting Physician (Nephrology) Leatrice Jewels Rayetta Pigg, MD as Consulting Physician (Psychiatry) Heath Lark, MD as Consulting Physician (Hematology and Oncology)  SUMMARY OF ONCOLOGIC HISTORY: Oncology History   Colon cancer, KRAS positive   Primary site: Colon and Rectum (Right)   Staging method: AJCC 7th Edition   Clinical: (T4b, N2b, M1) signed by Heath Lark, MD on 08/05/2013  9:52 AM   Pathologic: Stage IVB (T4b, N2b, M1b) signed by Heath Lark, MD on 08/05/2013 10:21 AM   Summary: Stage IVB (T4b, N2b, M1b)      Colon cancer   07/15/2013 - 07/25/2013 Hospital Admission The patient presented to the hospital with pain in her abdomen and underwent surgery for colon cancer   07/15/2013 Imaging CT scan of the abdomen show bowel dilatation suspicious for malignancy   07/17/2013 Tumor Marker Preoperative CEA was 2.2, normal   07/19/2013 Procedure Colonoscopy revealed ascending colon lesion with significant stricture and biopsy showed adenocarcinoma   07/20/2013 Surgery She underwent right hemicolectomy, cholecystectomy, liver biopsy, excision of abdominal wall mass and omental mass. During surgery, she was also found to have drop metastasis   08/29/2013 Imaging Repeat CT scan show persistent liver metastasis and new omental metastasis   08/31/2013 - 02/10/2014 Chemotherapy The patient was started on palliative chemotherapy with FOLFOX, bolus 5-FU committed and oxaliplatin dose reduced due to  peripheral neuropathy. She has completed 12 cycles of treatment.   11/22/2013 Imaging Repeat CT scan showed near complete response to treatment.   11/23/2013 - 02/10/2014 Chemotherapy Avastin was added to her chemotherapy regimen   12/21/2013 Adverse Reaction Oxaliplatin was omitted due to worsening peripheral neuropathy.   02/20/2014 Imaging Repeat CT scan showed near complete  resolution of all disease.   05/15/2014 Imaging Repeat CT scan showed peritoneal metastasis and recurrence of disease   05/22/2014 -  Chemotherapy Modify dosings of FOLFOX was restarted.    INTERVAL HISTORY: Please see below for problem oriented charting. She is seen today prior to chemotherapy. She noticed mild worsening peripheral neuropathy. She continued to struggle with constipation and has to take magnesium citrate regularly but despite that have not have good success. She has some mild abdominal bloating.  REVIEW OF SYSTEMS:   Constitutional: Denies fevers, chills or abnormal weight loss Eyes: Denies blurriness of vision Ears, nose, mouth, throat, and face: Denies mucositis or sore throat Respiratory: Denies cough, dyspnea or wheezes Cardiovascular: Denies palpitation, chest discomfort or lower extremity swelling Skin: Denies abnormal skin rashes Lymphatics: Denies new lymphadenopathy or easy bruising Behavioral/Psych: Mood is stable, no new changes  All other systems were reviewed with the patient and are negative.  I have reviewed the past medical history, past surgical history, social history and family history with the patient and they are unchanged from previous note.  ALLERGIES:  is allergic to naproxen.  MEDICATIONS:  Current Outpatient Prescriptions  Medication Sig Dispense Refill  . diazepam (VALIUM) 10 MG tablet Take 10 mg by mouth every 8 (eight) hours as needed for anxiety.    Marland Kitchen atorvastatin (LIPITOR) 40 MG tablet Take 40 mg by mouth daily.    . Buprenorphine HCl-Naloxone HCl (SUBOXONE SL) Place 8 mg under the tongue 3 (three) times daily.    . clonazePAM (KLONOPIN) 1 MG tablet Take 1 mg by mouth 3 (three) times daily.     . furosemide (LASIX) 40 MG tablet Take 1 tablet (  40 mg total) by mouth 2 (two) times daily. 60 tablet 5  . gabapentin (NEURONTIN) 800 MG tablet Take 800 mg by mouth 3 (three) times daily.    Marland Kitchen HYDROXYZINE PAMOATE PO Take 25 mg by mouth 3  (three) times daily.    . insulin glargine (LANTUS) 100 UNIT/ML injection Inject 27 Units into the skin daily.    . insulin lispro (HUMALOG) 100 UNIT/ML injection Inject 1-10 Units into the skin 3 (three) times daily before meals.    . lamoTRIgine (LAMICTAL) 100 MG tablet Take 200 mg by mouth every morning.     . lidocaine-prilocaine (EMLA) cream Apply 1 application topically as needed (for pain).    Marland Kitchen omeprazole (PRILOSEC) 20 MG capsule Take 20 mg by mouth every morning.    . polyethylene glycol powder (GLYCOLAX/MIRALAX) powder Take 17 g by mouth 2 (two) times daily as needed. 3350 g 3  . ROZEREM 8 MG tablet Take 8 mg by mouth at bedtime.  0  . senna-docusate (SENOKOT-S) 8.6-50 MG per tablet Take 1 tablet by mouth 2 (two) times daily. 90 tablet 3  . ziprasidone (GEODON) 80 MG capsule Take 160 mg by mouth at bedtime.      No current facility-administered medications for this visit.    PHYSICAL EXAMINATION: ECOG PERFORMANCE STATUS: 1 - Symptomatic but completely ambulatory  Filed Vitals:   07/03/14 1044  BP: 126/79  Pulse: 77  Temp: 97.5 F (36.4 C)  Resp: 18   Filed Weights   07/03/14 1044  Weight: 164 lb 6.4 oz (74.571 kg)    GENERAL:alert, no distress and comfortable SKIN: skin color, texture, turgor are normal, no rashes or significant lesions EYES: normal, Conjunctiva are pink and non-injected, sclera clear OROPHARYNX:no exudate, no erythema and lips, buccal mucosa, and tongue normal  NECK: supple, thyroid normal size, non-tender, without nodularity LYMPH:  no palpable lymphadenopathy in the cervical, axillary or inguinal LUNGS: clear to auscultation and percussion with normal breathing effort HEART: regular rate & rhythm and no murmurs and no lower extremity edema ABDOMEN:abdomen soft, non-tender and normal bowel sounds. Noted mild abdominal bloating Musculoskeletal:no cyanosis of digits and no clubbing  NEURO: alert & oriented x 3 with fluent speech, no focal  motor/sensory deficits  LABORATORY DATA:  I have reviewed the data as listed    Component Value Date/Time   NA 141 07/03/2014 1007   NA 142 05/14/2014 2233   K 4.0 07/03/2014 1007   K 3.8 05/14/2014 2233   CL 99 05/14/2014 2233   CO2 27 07/03/2014 1007   CO2 29 05/14/2014 2233   GLUCOSE 367* 07/03/2014 1007   GLUCOSE 88 05/14/2014 2233   BUN 10.5 07/03/2014 1007   BUN 8 05/14/2014 2233   CREATININE 1.2* 07/03/2014 1007   CREATININE 0.98 05/14/2014 2233   CREATININE 1.38* 05/26/2013 1703   CALCIUM 9.3 07/03/2014 1007   CALCIUM 9.1 05/14/2014 2233   PROT 6.8 07/03/2014 1007   PROT 7.1 05/14/2014 2233   ALBUMIN 3.3* 07/03/2014 1007   ALBUMIN 3.1* 05/14/2014 2233   AST 29 07/03/2014 1007   AST 28 05/14/2014 2233   ALT 24 07/03/2014 1007   ALT 18 05/14/2014 2233   ALKPHOS 101 07/03/2014 1007   ALKPHOS 124* 05/14/2014 2233   BILITOT 0.51 07/03/2014 1007   BILITOT 0.2* 05/14/2014 2233   GFRNONAA 65* 05/14/2014 2233   GFRNONAA 44* 05/26/2013 1703   GFRAA 76* 05/14/2014 2233   GFRAA 51* 05/26/2013 1703    No results found for:  SPEP, UPEP  Lab Results  Component Value Date   WBC 3.3* 07/03/2014   NEUTROABS 1.9 07/03/2014   HGB 13.6 07/03/2014   HCT 40.7 07/03/2014   MCV 84.4 07/03/2014   PLT 82* 07/03/2014      Chemistry      Component Value Date/Time   NA 141 07/03/2014 1007   NA 142 05/14/2014 2233   K 4.0 07/03/2014 1007   K 3.8 05/14/2014 2233   CL 99 05/14/2014 2233   CO2 27 07/03/2014 1007   CO2 29 05/14/2014 2233   BUN 10.5 07/03/2014 1007   BUN 8 05/14/2014 2233   CREATININE 1.2* 07/03/2014 1007   CREATININE 0.98 05/14/2014 2233   CREATININE 1.38* 05/26/2013 1703      Component Value Date/Time   CALCIUM 9.3 07/03/2014 1007   CALCIUM 9.1 05/14/2014 2233   ALKPHOS 101 07/03/2014 1007   ALKPHOS 124* 05/14/2014 2233   AST 29 07/03/2014 1007   AST 28 05/14/2014 2233   ALT 24 07/03/2014 1007   ALT 18 05/14/2014 2233   BILITOT 0.51 07/03/2014 1007    BILITOT 0.2* 05/14/2014 2233    Colon cancer My plan would be to give 4 cycles of treatment before repeat imaging. Overall, she tolerated treatment well from persistent constipation and neuropathy. I am concerned about her severe constipation and I would like to hold off giving her a Avastin until the results of repeat CT scan.    Constipation Clinically, she is responding to treatment. However, she is dependent on magnesium citrate and have struggle with constipation lately. I will hold Avastin until her next CT scan report.    Neuropathy due to secondary diabetes This is slightly worse while she is on oxaliplatin. I plan to reduce dose of chemotherapy in the future if repeat imaging study show positive response to treatment.  Thrombocytopenia due to drugs This is likely due to recent treatment. The patient denies recent history of bleeding such as epistaxis, hematuria or hematochezia. She is asymptomatic from the low platelet count. I will observe for now.  I will proceed with treatment today without delay all further dose adjustment.    Orders Placed This Encounter  Procedures  . CT Abdomen Pelvis W Contrast    Standing Status: Future     Number of Occurrences:      Standing Expiration Date: 10/03/2015    Order Specific Question:  Reason for Exam (SYMPTOM  OR DIAGNOSIS REQUIRED)    Answer:  staging colon cancer assess response to Rx    Order Specific Question:  Is the patient pregnant?    Answer:  No    Order Specific Question:  Preferred imaging location?    Answer:  [facility]  . CEA    Standing Status: Future     Number of Occurrences:      Standing Expiration Date: 08/07/2015   All questions were answered. The patient knows to call the clinic with any problems, questions or concerns. No barriers to learning was detected. I spent 30 minutes counseling the patient face to face. The total time spent in the appointment was 40 minutes and more than 50% was on  counseling and review of test results     [facility], Angelina, MD 07/03/2014 8:35 PM

## 2014-07-03 NOTE — Telephone Encounter (Signed)
Per staff message and POF I have scheduled appts. Advised scheduler of appts. JMW  

## 2014-07-03 NOTE — Assessment & Plan Note (Signed)
This is slightly worse while she is on oxaliplatin. I plan to reduce dose of chemotherapy in the future if repeat imaging study show positive response to treatment.

## 2014-07-03 NOTE — Assessment & Plan Note (Signed)
Clinically, she is responding to treatment. However, she is dependent on magnesium citrate and have struggle with constipation lately. I will hold Avastin until her next CT scan report.

## 2014-07-03 NOTE — Assessment & Plan Note (Signed)
This is likely due to recent treatment. The patient denies recent history of bleeding such as epistaxis, hematuria or hematochezia. She is asymptomatic from the low platelet count. I will observe for now.  I will proceed with treatment today without delay all further dose adjustment.

## 2014-07-03 NOTE — Assessment & Plan Note (Signed)
My plan would be to give 4 cycles of treatment before repeat imaging. Overall, she tolerated treatment well from persistent constipation and neuropathy. I am concerned about her severe constipation and I would like to hold off giving her a Avastin until the results of repeat CT scan.

## 2014-07-03 NOTE — Progress Notes (Signed)
OK to treat with low platelets today per MD note and hold Avastin per Dr. Alvy Bimler.

## 2014-07-05 ENCOUNTER — Ambulatory Visit: Payer: Medicaid Other

## 2014-07-05 DIAGNOSIS — C189 Malignant neoplasm of colon, unspecified: Secondary | ICD-10-CM

## 2014-07-05 MED ORDER — HEPARIN SOD (PORK) LOCK FLUSH 100 UNIT/ML IV SOLN
500.0000 [IU] | Freq: Once | INTRAVENOUS | Status: AC | PRN
  Administered 2014-07-05: 500 [IU]
  Filled 2014-07-05: qty 5

## 2014-07-05 MED ORDER — SODIUM CHLORIDE 0.9 % IJ SOLN
10.0000 mL | INTRAMUSCULAR | Status: DC | PRN
  Administered 2014-07-05: 10 mL
  Filled 2014-07-05: qty 10

## 2014-07-05 NOTE — Patient Instructions (Signed)

## 2014-07-13 ENCOUNTER — Encounter: Payer: Self-pay | Admitting: Family Medicine

## 2014-07-14 ENCOUNTER — Encounter (HOSPITAL_COMMUNITY): Payer: Self-pay

## 2014-07-14 ENCOUNTER — Ambulatory Visit (HOSPITAL_COMMUNITY)
Admission: RE | Admit: 2014-07-14 | Discharge: 2014-07-14 | Disposition: A | Discharge status: Home / Self Care | Payer: Medicaid Other | Source: Ambulatory Visit | Attending: Hematology and Oncology | Admitting: Hematology and Oncology

## 2014-07-14 DIAGNOSIS — B182 Chronic viral hepatitis C: Secondary | ICD-10-CM | POA: Insufficient documentation

## 2014-07-14 DIAGNOSIS — C189 Malignant neoplasm of colon, unspecified: Secondary | ICD-10-CM | POA: Insufficient documentation

## 2014-07-14 DIAGNOSIS — Z79899 Other long term (current) drug therapy: Secondary | ICD-10-CM | POA: Diagnosis not present

## 2014-07-14 DIAGNOSIS — R109 Unspecified abdominal pain: Secondary | ICD-10-CM | POA: Diagnosis not present

## 2014-07-14 DIAGNOSIS — R197 Diarrhea, unspecified: Secondary | ICD-10-CM | POA: Diagnosis not present

## 2014-07-14 DIAGNOSIS — C787 Secondary malignant neoplasm of liver and intrahepatic bile duct: Secondary | ICD-10-CM

## 2014-07-14 MED ORDER — IOHEXOL 300 MG/ML  SOLN
100.0000 mL | Freq: Once | INTRAMUSCULAR | Status: AC | PRN
  Administered 2014-07-14: 100 mL via INTRAVENOUS

## 2014-07-17 ENCOUNTER — Telehealth: Payer: Self-pay | Admitting: Hematology and Oncology

## 2014-07-17 ENCOUNTER — Other Ambulatory Visit (HOSPITAL_BASED_OUTPATIENT_CLINIC_OR_DEPARTMENT_OTHER): Payer: Medicaid Other

## 2014-07-17 ENCOUNTER — Encounter: Payer: Self-pay | Admitting: Hematology and Oncology

## 2014-07-17 ENCOUNTER — Ambulatory Visit (HOSPITAL_BASED_OUTPATIENT_CLINIC_OR_DEPARTMENT_OTHER): Payer: Medicaid Other

## 2014-07-17 ENCOUNTER — Ambulatory Visit (HOSPITAL_BASED_OUTPATIENT_CLINIC_OR_DEPARTMENT_OTHER): Payer: Medicaid Other | Admitting: Hematology and Oncology

## 2014-07-17 VITALS — BP 118/72 | HR 66 | Temp 98.2°F | Resp 18 | Ht 65.0 in | Wt 168.9 lb

## 2014-07-17 DIAGNOSIS — C189 Malignant neoplasm of colon, unspecified: Secondary | ICD-10-CM

## 2014-07-17 DIAGNOSIS — C182 Malignant neoplasm of ascending colon: Secondary | ICD-10-CM

## 2014-07-17 DIAGNOSIS — Z720 Tobacco use: Secondary | ICD-10-CM

## 2014-07-17 DIAGNOSIS — C786 Secondary malignant neoplasm of retroperitoneum and peritoneum: Secondary | ICD-10-CM

## 2014-07-17 DIAGNOSIS — D6959 Other secondary thrombocytopenia: Secondary | ICD-10-CM

## 2014-07-17 DIAGNOSIS — T50905A Adverse effect of unspecified drugs, medicaments and biological substances, initial encounter: Secondary | ICD-10-CM

## 2014-07-17 DIAGNOSIS — C787 Secondary malignant neoplasm of liver and intrahepatic bile duct: Secondary | ICD-10-CM

## 2014-07-17 DIAGNOSIS — R911 Solitary pulmonary nodule: Secondary | ICD-10-CM

## 2014-07-17 DIAGNOSIS — F17200 Nicotine dependence, unspecified, uncomplicated: Secondary | ICD-10-CM

## 2014-07-17 DIAGNOSIS — K5909 Other constipation: Secondary | ICD-10-CM

## 2014-07-17 DIAGNOSIS — Z5111 Encounter for antineoplastic chemotherapy: Secondary | ICD-10-CM

## 2014-07-17 DIAGNOSIS — E1340 Other specified diabetes mellitus with diabetic neuropathy, unspecified: Secondary | ICD-10-CM

## 2014-07-17 DIAGNOSIS — Z5112 Encounter for antineoplastic immunotherapy: Secondary | ICD-10-CM

## 2014-07-17 LAB — COMPREHENSIVE METABOLIC PANEL (CC13)
ALBUMIN: 3.3 g/dL — AB (ref 3.5–5.0)
ALK PHOS: 102 U/L (ref 40–150)
ALT: 23 U/L (ref 0–55)
AST: 25 U/L (ref 5–34)
Anion Gap: 11 mEq/L (ref 3–11)
BUN: 11.4 mg/dL (ref 7.0–26.0)
CALCIUM: 9 mg/dL (ref 8.4–10.4)
CO2: 28 mEq/L (ref 22–29)
Chloride: 104 mEq/L (ref 98–109)
Creatinine: 1.1 mg/dL (ref 0.6–1.1)
EGFR: 57 mL/min/{1.73_m2} — ABNORMAL LOW (ref >90)
Glucose: 100 mg/dl (ref 70–140)
POTASSIUM: 3.8 meq/L (ref 3.5–5.1)
Sodium: 143 mEq/L (ref 136–145)
Total Bilirubin: 0.25 mg/dL (ref 0.20–1.20)
Total Protein: 6.6 g/dL (ref 6.4–8.3)

## 2014-07-17 LAB — CBC WITH DIFFERENTIAL/PLATELET
BASO%: 0.3 % (ref 0.0–2.0)
Basophils Absolute: 0 10*3/uL (ref 0.0–0.1)
EOS%: 2 % (ref 0.0–7.0)
Eosinophils Absolute: 0.1 10*3/uL (ref 0.0–0.5)
HCT: 36.8 % (ref 34.8–46.6)
HGB: 12.3 g/dL (ref 11.6–15.9)
LYMPH%: 35.5 % (ref 14.0–49.7)
MCH: 28.7 pg (ref 25.1–34.0)
MCHC: 33.4 g/dL (ref 31.5–36.0)
MCV: 85.8 fL (ref 79.5–101.0)
MONO#: 0.4 10*3/uL (ref 0.1–0.9)
MONO%: 12 % (ref 0.0–14.0)
NEUT#: 1.5 10*3/uL (ref 1.5–6.5)
NEUT%: 50.2 % (ref 38.4–76.8)
Platelets: 130 10*3/uL — ABNORMAL LOW (ref 145–400)
RBC: 4.29 10*6/uL (ref 3.70–5.45)
RDW: 16.5 % — AB (ref 11.2–14.5)
WBC: 3 10*3/uL — ABNORMAL LOW (ref 3.9–10.3)
lymph#: 1.1 10*3/uL (ref 0.9–3.3)

## 2014-07-17 MED ORDER — DEXAMETHASONE SODIUM PHOSPHATE 10 MG/ML IJ SOLN
INTRAMUSCULAR | Status: AC
  Filled 2014-07-17: qty 1

## 2014-07-17 MED ORDER — DEXAMETHASONE SODIUM PHOSPHATE 10 MG/ML IJ SOLN
10.0000 mg | Freq: Once | INTRAMUSCULAR | Status: AC
  Administered 2014-07-17: 10 mg via INTRAVENOUS

## 2014-07-17 MED ORDER — DEXTROSE 5 % IV SOLN
Freq: Once | INTRAVENOUS | Status: AC
  Administered 2014-07-17: 13:00:00 via INTRAVENOUS

## 2014-07-17 MED ORDER — ONDANSETRON 8 MG/50ML IVPB (CHCC)
8.0000 mg | Freq: Once | INTRAVENOUS | Status: AC
  Administered 2014-07-17: 8 mg via INTRAVENOUS

## 2014-07-17 MED ORDER — ONDANSETRON 8 MG/NS 50 ML IVPB
INTRAVENOUS | Status: AC
  Filled 2014-07-17: qty 8

## 2014-07-17 MED ORDER — SODIUM CHLORIDE 0.9 % IV SOLN
2400.0000 mg/m2 | INTRAVENOUS | Status: DC
  Administered 2014-07-17: 4150 mg via INTRAVENOUS
  Filled 2014-07-17: qty 83

## 2014-07-17 MED ORDER — OXALIPLATIN CHEMO INJECTION 100 MG/20ML
50.0000 mg/m2 | Freq: Once | INTRAVENOUS | Status: AC
  Administered 2014-07-17: 85 mg via INTRAVENOUS
  Filled 2014-07-17: qty 17

## 2014-07-17 MED ORDER — SODIUM CHLORIDE 0.9 % IV SOLN
Freq: Once | INTRAVENOUS | Status: AC
  Administered 2014-07-17: 12:00:00 via INTRAVENOUS

## 2014-07-17 MED ORDER — SODIUM CHLORIDE 0.9 % IV SOLN
5.0000 mg/kg | Freq: Once | INTRAVENOUS | Status: AC
  Administered 2014-07-17: 375 mg via INTRAVENOUS
  Filled 2014-07-17: qty 15

## 2014-07-17 MED ORDER — LEUCOVORIN CALCIUM INJECTION 350 MG
400.0000 mg/m2 | Freq: Once | INTRAMUSCULAR | Status: AC
  Administered 2014-07-17: 692 mg via INTRAVENOUS
  Filled 2014-07-17: qty 34.6

## 2014-07-17 NOTE — Progress Notes (Signed)
This encounter was created in error - please disregard.

## 2014-07-17 NOTE — Assessment & Plan Note (Signed)
Clinically, she is responding to treatment. However, she is dependent on magnesium citrate and have struggle with constipation lately. Continue to same. 

## 2014-07-17 NOTE — Assessment & Plan Note (Addendum)
Recent imaging study show positive response to treatment. My plan would be to give 4 more cycles of treatment before repeat imaging. I will add CT scan of the chest with next imaging to evaluate for lung nodules. Overall, she tolerated treatment well from persistent constipation and neuropathy. I will resume Avastin with each cycle of treatment.

## 2014-07-17 NOTE — Assessment & Plan Note (Signed)
I spent some time counseling the patient the importance of tobacco cessation. She is attempting to quit now.

## 2014-07-17 NOTE — Patient Instructions (Signed)
[facility] Discharge Instructions for Patients Receiving Chemotherapy  Today you received the following chemotherapy agents FOLFOX/Avastin.  To help prevent nausea and vomiting after your treatment, we encourage you to take your nausea medication as directed.    If you develop nausea and vomiting that is not controlled by your nausea medication, call the clinic.   BELOW ARE SYMPTOMS THAT SHOULD BE REPORTED IMMEDIATELY:  *FEVER GREATER THAN 100.5 F  *CHILLS WITH OR WITHOUT FEVER  NAUSEA AND VOMITING THAT IS NOT CONTROLLED WITH YOUR NAUSEA MEDICATION  *UNUSUAL SHORTNESS OF BREATH  *UNUSUAL BRUISING OR BLEEDING  TENDERNESS IN MOUTH AND THROAT WITH OR WITHOUT PRESENCE OF ULCERS  *URINARY PROBLEMS  *BOWEL PROBLEMS  UNUSUAL RASH Items with * indicate a potential emergency and should be followed up as soon as possible.  Feel free to call the clinic you have any questions or concerns. The clinic phone number is [phone].    

## 2014-07-17 NOTE — Assessment & Plan Note (Signed)
This is slightly worse while she is on oxaliplatin. We shall continue same dose of chemotherapy without dose adjustment. 

## 2014-07-17 NOTE — Assessment & Plan Note (Signed)
I plan to add CT scan of the chest with next imaging study for evaluation.

## 2014-07-17 NOTE — Telephone Encounter (Signed)
Gave avs & cal for Dec/Jan. Sent mess to sch tx °

## 2014-07-17 NOTE — Assessment & Plan Note (Signed)
This is likely due to recent treatment. The patient denies recent history of bleeding such as epistaxis, hematuria or hematochezia. She is asymptomatic from the low platelet count. I will observe for now.  I will proceed with treatment today without delay and without further dose adjustment. 

## 2014-07-17 NOTE — Progress Notes (Signed)
[facility] OFFICE PROGRESS NOTE  Patient Care Team: Elberta Leatherwood, MD as PCP - General Windy Kalata, MD as Consulting Physician (Nephrology) Leatrice Jewels Rayetta Pigg, MD as Consulting Physician (Psychiatry) Heath Lark, MD as Consulting Physician (Hematology and Oncology)  SUMMARY OF ONCOLOGIC HISTORY: Oncology History   Colon cancer, KRAS positive   Primary site: Colon and Rectum (Right)   Staging method: AJCC 7th Edition   Clinical: (T4b, N2b, M1) signed by Heath Lark, MD on 08/05/2013  9:52 AM   Pathologic: Stage IVB (T4b, N2b, M1b) signed by Heath Lark, MD on 08/05/2013 10:21 AM   Summary: Stage IVB (T4b, N2b, M1b)      Colon cancer   07/15/2013 - 07/25/2013 Hospital Admission The patient presented to the hospital with pain in her abdomen and underwent surgery for colon cancer   07/15/2013 Imaging CT scan of the abdomen show bowel dilatation suspicious for malignancy   07/17/2013 Tumor Marker Preoperative CEA was 2.2, normal   07/19/2013 Procedure Colonoscopy revealed ascending colon lesion with significant stricture and biopsy showed adenocarcinoma   07/20/2013 Surgery She underwent right hemicolectomy, cholecystectomy, liver biopsy, excision of abdominal wall mass and omental mass. During surgery, she was also found to have drop metastasis   08/29/2013 Imaging Repeat CT scan show persistent liver metastasis and new omental metastasis   08/31/2013 - 02/10/2014 Chemotherapy The patient was started on palliative chemotherapy with FOLFOX, bolus 5-FU committed and oxaliplatin dose reduced due to  peripheral neuropathy. She has completed 12 cycles of treatment.   11/22/2013 Imaging Repeat CT scan showed near complete response to treatment.   11/23/2013 - 02/10/2014 Chemotherapy Avastin was added to her chemotherapy regimen   12/21/2013 Adverse Reaction Oxaliplatin was omitted due to worsening peripheral neuropathy.   02/20/2014 Imaging Repeat CT scan showed near complete  resolution of all disease.   05/15/2014 Imaging Repeat CT scan showed peritoneal metastasis and recurrence of disease   05/22/2014 -  Chemotherapy Modify dosings of FOLFOX was restarted.   07/14/2014 Imaging CT scan of the abdomen and pelvis show positive response to treatment.    INTERVAL HISTORY: Please see below for problem oriented charting. She is seen prior to chemotherapy and to review imaging study. Overall she is doing well apart from persistent constipation. She denies worsening peripheral neuropathy.  REVIEW OF SYSTEMS:   Constitutional: Denies fevers, chills or abnormal weight loss Eyes: Denies blurriness of vision Ears, nose, mouth, throat, and face: Denies mucositis or sore throat Respiratory: Denies cough, dyspnea or wheezes Cardiovascular: Denies palpitation, chest discomfort or lower extremity swelling Skin: Denies abnormal skin rashes Lymphatics: Denies new lymphadenopathy or easy bruising Behavioral/Psych: Mood is stable, no new changes  All other systems were reviewed with the patient and are negative.  I have reviewed the past medical history, past surgical history, social history and family history with the patient and they are unchanged from previous note.  ALLERGIES:  is allergic to naproxen.  MEDICATIONS:  Current Outpatient Prescriptions  Medication Sig Dispense Refill  . atorvastatin (LIPITOR) 40 MG tablet Take 40 mg by mouth daily.    . Buprenorphine HCl-Naloxone HCl (SUBOXONE SL) Place 8 mg under the tongue 3 (three) times daily.    . clonazePAM (KLONOPIN) 1 MG tablet Take 1 mg by mouth 3 (three) times daily.     . diazepam (VALIUM) 10 MG tablet Take 10 mg by mouth every 8 (eight) hours as needed for anxiety.    . furosemide (LASIX) 40 MG tablet Take 1  tablet (40 mg total) by mouth 2 (two) times daily. 60 tablet 5  . gabapentin (NEURONTIN) 800 MG tablet Take 800 mg by mouth 3 (three) times daily.    Marland Kitchen HYDROXYZINE PAMOATE PO Take 25 mg by mouth 3 (three)  times daily.    . insulin glargine (LANTUS) 100 UNIT/ML injection Inject 27 Units into the skin daily.    . insulin lispro (HUMALOG) 100 UNIT/ML injection Inject 1-10 Units into the skin 3 (three) times daily before meals.    . lamoTRIgine (LAMICTAL) 100 MG tablet Take 200 mg by mouth every morning.     . lidocaine-prilocaine (EMLA) cream Apply 1 application topically as needed (for pain).    Marland Kitchen omeprazole (PRILOSEC) 20 MG capsule Take 20 mg by mouth every morning.    . polyethylene glycol powder (GLYCOLAX/MIRALAX) powder Take 17 g by mouth 2 (two) times daily as needed. 3350 g 3  . ROZEREM 8 MG tablet Take 8 mg by mouth at bedtime.  0  . senna-docusate (SENOKOT-S) 8.6-50 MG per tablet Take 1 tablet by mouth 2 (two) times daily. 90 tablet 3  . ziprasidone (GEODON) 80 MG capsule Take 160 mg by mouth at bedtime.      No current facility-administered medications for this visit.    PHYSICAL EXAMINATION: ECOG PERFORMANCE STATUS: 1 - Symptomatic but completely ambulatory  Filed Vitals:   07/17/14 1038  BP: 118/72  Pulse: 66  Temp: 98.2 F (36.8 C)  Resp: 18   Filed Weights   07/17/14 1038  Weight: 168 lb 14.4 oz (76.613 kg)    GENERAL:alert, no distress and comfortable SKIN: skin color, texture, turgor are normal, no rashes or significant lesions EYES: normal, Conjunctiva are pink and non-injected, sclera clear OROPHARYNX:no exudate, no erythema and lips, buccal mucosa, and tongue normal  NECK: supple, thyroid normal size, non-tender, without nodularity LYMPH:  no palpable lymphadenopathy in the cervical, axillary or inguinal LUNGS: clear to auscultation and percussion with normal breathing effort HEART: regular rate & rhythm and no murmurs and no lower extremity edema ABDOMEN:abdomen soft, non-tender and normal bowel sounds Musculoskeletal:no cyanosis of digits and no clubbing  NEURO: alert & oriented x 3 with fluent speech, no focal motor/sensory deficits  LABORATORY DATA:  I  have reviewed the data as listed    Component Value Date/Time   NA 143 07/17/2014 1024   NA 142 05/14/2014 2233   K 3.8 07/17/2014 1024   K 3.8 05/14/2014 2233   CL 99 05/14/2014 2233   CO2 28 07/17/2014 1024   CO2 29 05/14/2014 2233   GLUCOSE 100 07/17/2014 1024   GLUCOSE 88 05/14/2014 2233   BUN 11.4 07/17/2014 1024   BUN 8 05/14/2014 2233   CREATININE 1.1 07/17/2014 1024   CREATININE 0.98 05/14/2014 2233   CREATININE 1.38* 05/26/2013 1703   CALCIUM 9.0 07/17/2014 1024   CALCIUM 9.1 05/14/2014 2233   PROT 6.6 07/17/2014 1024   PROT 7.1 05/14/2014 2233   ALBUMIN 3.3* 07/17/2014 1024   ALBUMIN 3.1* 05/14/2014 2233   AST 25 07/17/2014 1024   AST 28 05/14/2014 2233   ALT 23 07/17/2014 1024   ALT 18 05/14/2014 2233   ALKPHOS 102 07/17/2014 1024   ALKPHOS 124* 05/14/2014 2233   BILITOT 0.25 07/17/2014 1024   BILITOT 0.2* 05/14/2014 2233   GFRNONAA 65* 05/14/2014 2233   GFRNONAA 44* 05/26/2013 1703   GFRAA 76* 05/14/2014 2233   GFRAA 51* 05/26/2013 1703    No results found for: SPEP, UPEP  Lab Results  Component Value Date   WBC 3.0* 07/17/2014   NEUTROABS 1.5 07/17/2014   HGB 12.3 07/17/2014   HCT 36.8 07/17/2014   MCV 85.8 07/17/2014   PLT 130* 07/17/2014      Chemistry      Component Value Date/Time   NA 143 07/17/2014 1024   NA 142 05/14/2014 2233   K 3.8 07/17/2014 1024   K 3.8 05/14/2014 2233   CL 99 05/14/2014 2233   CO2 28 07/17/2014 1024   CO2 29 05/14/2014 2233   BUN 11.4 07/17/2014 1024   BUN 8 05/14/2014 2233   CREATININE 1.1 07/17/2014 1024   CREATININE 0.98 05/14/2014 2233   CREATININE 1.38* 05/26/2013 1703      Component Value Date/Time   CALCIUM 9.0 07/17/2014 1024   CALCIUM 9.1 05/14/2014 2233   ALKPHOS 102 07/17/2014 1024   ALKPHOS 124* 05/14/2014 2233   AST 25 07/17/2014 1024   AST 28 05/14/2014 2233   ALT 23 07/17/2014 1024   ALT 18 05/14/2014 2233   BILITOT 0.25 07/17/2014 1024   BILITOT 0.2* 05/14/2014 2233        RADIOGRAPHIC STUDIES: I reviewed the imaging study with the patient I have personally reviewed the radiological images as listed and agreed with the findings in the report.   ASSESSMENT & PLAN:  Colon cancer Recent imaging study show positive response to treatment. My plan would be to give 4 more cycles of treatment before repeat imaging. I will add CT scan of the chest with next imaging to evaluate for lung nodules. Overall, she tolerated treatment well from persistent constipation and neuropathy. I will resume Avastin with each cycle of treatment.    Constipation Clinically, she is responding to treatment. However, she is dependent on magnesium citrate and have struggle with constipation lately. Continue to same.      Neuropathy due to secondary diabetes This is slightly worse while she is on oxaliplatin. We shall continue same dose of chemotherapy without dose adjustment.  Thrombocytopenia due to drugs This is likely due to recent treatment. The patient denies recent history of bleeding such as epistaxis, hematuria or hematochezia. She is asymptomatic from the low platelet count. I will observe for now.  I will proceed with treatment today without delay and without further dose adjustment.  TOBACCO ABUSE I spent some time counseling the patient the importance of tobacco cessation. She is attempting to quit now.   Lung nodule seen on imaging study I plan to add CT scan of the chest with next imaging study for evaluation.   No orders of the defined types were placed in this encounter.   All questions were answered. The patient knows to call the clinic with any problems, questions or concerns. No barriers to learning was detected. I spent 30 minutes counseling the patient face to face. The total time spent in the appointment was 40 minutes and more than 50% was on counseling and review of test results     [facility], [place], MD 07/17/2014 11:17 AM

## 2014-07-18 LAB — CEA: CEA: 9.8 ng/mL — ABNORMAL HIGH (ref 0.0–5.0)

## 2014-07-19 ENCOUNTER — Ambulatory Visit (HOSPITAL_BASED_OUTPATIENT_CLINIC_OR_DEPARTMENT_OTHER): Payer: Medicaid Other

## 2014-07-19 DIAGNOSIS — C182 Malignant neoplasm of ascending colon: Secondary | ICD-10-CM

## 2014-07-19 DIAGNOSIS — C786 Secondary malignant neoplasm of retroperitoneum and peritoneum: Secondary | ICD-10-CM

## 2014-07-19 DIAGNOSIS — C787 Secondary malignant neoplasm of liver and intrahepatic bile duct: Secondary | ICD-10-CM

## 2014-07-19 DIAGNOSIS — C189 Malignant neoplasm of colon, unspecified: Secondary | ICD-10-CM

## 2014-07-19 DIAGNOSIS — C7989 Secondary malignant neoplasm of other specified sites: Secondary | ICD-10-CM

## 2014-07-19 MED ORDER — SODIUM CHLORIDE 0.9 % IJ SOLN
10.0000 mL | INTRAMUSCULAR | Status: DC | PRN
  Administered 2014-07-19: 10 mL
  Filled 2014-07-19: qty 10

## 2014-07-19 MED ORDER — HEPARIN SOD (PORK) LOCK FLUSH 100 UNIT/ML IV SOLN
500.0000 [IU] | Freq: Once | INTRAVENOUS | Status: AC | PRN
  Administered 2014-07-19: 500 [IU]
  Filled 2014-07-19: qty 5

## 2014-07-19 NOTE — Patient Instructions (Signed)
Fluorouracil, 5FU; Diclofenac topical cream What is this medicine? FLUOROURACIL; DICLOFENAC (flure oh YOOR a sil; dye KLOE fen ak) is a combination of a topical chemotherapy agent and non-steroidal anti-inflammatory drug (NSAID). It is used on the skin to treat skin cancer and skin conditions that could become cancer. This medicine may be used for other purposes; ask your health care provider or pharmacist if you have questions. COMMON BRAND NAME(S): FLUORAC What should I tell my health care provider before I take this medicine? They need to know if you have any of these conditions: -bleeding problems -cigarette smoker -DPD enzyme deficiency -heart disease -high blood pressure -if you frequently drink alcohol containing drinks -kidney disease -liver disease -open or infected skin -stomach problems -swelling or open sores at the treatment site -recent or planned coronary artery bypass graft (CABG) surgery -an unusual or allergic reaction to fluorouracil, diclofenac, aspirin, other NSAIDs, other medicines, foods, dyes, or preservatives -pregnant or trying to get pregnant -breast-feeding How should I use this medicine? This medicine is only for use on the skin. Follow the directions on the prescription label. Wash hands before and after use. Wash affected area and gently pat dry. To apply this medicine use a cotton-tipped applicator, or use gloves if applying with fingertips. If applied with unprotected fingertips, it is very important to wash your hands well after you apply this medicine. Avoid applying to the eyes, nose, or mouth. Apply enough medicine to cover the affected area. You can cover the area with a light gauze dressing, but do not use tight or air-tight dressings. Finish the full course prescribed by your doctor or health care professional, even if you think your condition is better. Do not stop taking except on the advice of your doctor or health care professional. Talk to your  pediatrician regarding the use of this medicine in children. Special care may be needed. Overdosage: If you think you've taken too much of this medicine contact a poison control center or emergency room at once. Overdosage: If you think you have taken too much of this medicine contact a poison control center or emergency room at once. NOTE: This medicine is only for you. Do not share this medicine with others. What if I miss a dose? If you miss a dose, apply it as soon as you can. If it is almost time for your next dose, only use that dose. Do not apply extra doses. Contact your doctor or health care professional if you miss more than one dose. What may interact with this medicine? Interactions are not expected. Do not use any other skin products without telling your doctor or health care professional. This list may not describe all possible interactions. Give your health care provider a list of all the medicines, herbs, non-prescription drugs, or dietary supplements you use. Also tell them if you smoke, drink alcohol, or use illegal drugs. Some items may interact with your medicine. What should I watch for while using this medicine? Visit your doctor or health care professional for checks on your progress. You will need to use this medicine for 2 to 6 weeks. This may be longer depending on the condition being treated. You may not see full healing for another 1 to 2 months after you stop using the medicine. Treated areas of skin can look unsightly during and for several weeks after treatment with this medicine. This medicine can make you more sensitive to the sun. Keep out of the sun. If you cannot avoid being in   the sun, wear protective clothing and use sunscreen. Do not use sun lamps or tanning beds/booths. Where should I keep my What side effects may I notice from receiving this medicine? Side effects that you should report to your doctor or health care professional as soon as possible: -allergic  reactions like skin rash, itching or hives, swelling of the face, lips, or tongue -black or bloody stools, blood in the urine or vomit -blurred vision -chest pain -difficulty breathing or wheezing -redness, blistering, peeling or loosening of the skin, including inside the mouth -severe redness and swelling of normal skin -slurred speech or weakness on one side of the body -trouble passing urine or change in the amount of urine -unexplained weight gain or swelling -unusually weak or tired -yellowing of eyes or skin Side effects that usually do not require medical attention (Report these to your doctor or health care professional if they continue or are bothersome.): -increased sensitivity of the skin to sun and ultraviolet light -pain and burning of the affected area -scaling or swelling of the affected area -skin rash, itching of the affected area -tenderness This list may not describe all possible side effects. Call your doctor for medical advice about side effects. You may report side effects to FDA at 1-800-FDA-1088. Where should I keep my medicine? Keep out of the reach of children. Store at room temperature between 20 and 25 degrees C (68 and 77 degrees F). Throw away any unused medicine after the expiration date. NOTE: This sheet is a summary. It may not cover all possible information. If you have questions about this medicine, talk to your doctor, pharmacist, or health care provider.  2015, [employer]vier/Gold Standard. (2013-11-21 11:09:58)  

## 2014-07-20 ENCOUNTER — Telehealth: Payer: Self-pay | Admitting: Family Medicine

## 2014-07-20 NOTE — Telephone Encounter (Signed)
Pt called and would like a refill on her test strips. jw

## 2014-07-20 NOTE — Telephone Encounter (Signed)
Call patient's pharmacy because we did not have her test strips in our records and I did not know which type of test strips disorder. Was able to place verbal order for 100 test strips with 2 additional refills.  For the record patient's test strips are: Accu-Chek One Touch test strips

## 2014-07-24 ENCOUNTER — Other Ambulatory Visit: Payer: Self-pay | Admitting: *Deleted

## 2014-07-24 MED ORDER — GLUCOSE BLOOD VI STRP
ORAL_STRIP | Status: DC
Start: 2014-07-24 — End: 2014-11-23

## 2014-07-31 ENCOUNTER — Other Ambulatory Visit: Payer: Self-pay

## 2014-07-31 ENCOUNTER — Other Ambulatory Visit (HOSPITAL_BASED_OUTPATIENT_CLINIC_OR_DEPARTMENT_OTHER): Payer: Medicaid Other

## 2014-07-31 ENCOUNTER — Ambulatory Visit (HOSPITAL_BASED_OUTPATIENT_CLINIC_OR_DEPARTMENT_OTHER): Payer: Medicaid Other

## 2014-07-31 VITALS — BP 145/78 | HR 61 | Temp 98.6°F | Resp 18

## 2014-07-31 DIAGNOSIS — C182 Malignant neoplasm of ascending colon: Secondary | ICD-10-CM | POA: Diagnosis not present

## 2014-07-31 DIAGNOSIS — Z5112 Encounter for antineoplastic immunotherapy: Secondary | ICD-10-CM

## 2014-07-31 DIAGNOSIS — Z5111 Encounter for antineoplastic chemotherapy: Secondary | ICD-10-CM | POA: Diagnosis not present

## 2014-07-31 DIAGNOSIS — C189 Malignant neoplasm of colon, unspecified: Secondary | ICD-10-CM

## 2014-07-31 LAB — CBC WITH DIFFERENTIAL/PLATELET
BASO%: 0.3 % (ref 0.0–2.0)
BASOS ABS: 0 10*3/uL (ref 0.0–0.1)
EOS%: 2.4 % (ref 0.0–7.0)
Eosinophils Absolute: 0.1 10*3/uL (ref 0.0–0.5)
HEMATOCRIT: 41.4 % (ref 34.8–46.6)
HGB: 13.5 g/dL (ref 11.6–15.9)
LYMPH#: 1.2 10*3/uL (ref 0.9–3.3)
LYMPH%: 35.3 % (ref 14.0–49.7)
MCH: 28.5 pg (ref 25.1–34.0)
MCHC: 32.6 g/dL (ref 31.5–36.0)
MCV: 87.3 fL (ref 79.5–101.0)
MONO#: 0.4 10*3/uL (ref 0.1–0.9)
MONO%: 12.1 % (ref 0.0–14.0)
NEUT#: 1.7 10*3/uL (ref 1.5–6.5)
NEUT%: 49.9 % (ref 38.4–76.8)
Platelets: 124 10*3/uL — ABNORMAL LOW (ref 145–400)
RBC: 4.74 10*6/uL (ref 3.70–5.45)
RDW: 16.7 % — AB (ref 11.2–14.5)
WBC: 3.3 10*3/uL — AB (ref 3.9–10.3)

## 2014-07-31 LAB — COMPREHENSIVE METABOLIC PANEL (CC13)
ALBUMIN: 3.6 g/dL (ref 3.5–5.0)
ALT: 21 U/L (ref 0–55)
AST: 23 U/L (ref 5–34)
Alkaline Phosphatase: 90 U/L (ref 40–150)
Anion Gap: 12 mEq/L — ABNORMAL HIGH (ref 3–11)
BUN: 8 mg/dL (ref 7.0–26.0)
CALCIUM: 9.3 mg/dL (ref 8.4–10.4)
CHLORIDE: 102 meq/L (ref 98–109)
CO2: 28 mEq/L (ref 22–29)
Creatinine: 1.2 mg/dL — ABNORMAL HIGH (ref 0.6–1.1)
EGFR: 53 mL/min/{1.73_m2} — ABNORMAL LOW (ref >90)
Glucose: 125 mg/dl (ref 70–140)
POTASSIUM: 3.8 meq/L (ref 3.5–5.1)
Sodium: 142 mEq/L (ref 136–145)
Total Bilirubin: 0.44 mg/dL (ref 0.20–1.20)
Total Protein: 7.2 g/dL (ref 6.4–8.3)

## 2014-07-31 LAB — UA PROTEIN, DIPSTICK - CHCC: Protein, ur: NEGATIVE mg/dL

## 2014-07-31 MED ORDER — OXALIPLATIN CHEMO INJECTION 100 MG/20ML
50.0000 mg/m2 | Freq: Once | INTRAVENOUS | Status: AC
  Administered 2014-07-31: 85 mg via INTRAVENOUS
  Filled 2014-07-31: qty 17

## 2014-07-31 MED ORDER — SODIUM CHLORIDE 0.9 % IV SOLN
5.0000 mg/kg | Freq: Once | INTRAVENOUS | Status: AC
  Administered 2014-07-31: 375 mg via INTRAVENOUS
  Filled 2014-07-31: qty 15

## 2014-07-31 MED ORDER — DEXAMETHASONE SODIUM PHOSPHATE 10 MG/ML IJ SOLN
INTRAMUSCULAR | Status: AC
  Filled 2014-07-31: qty 1

## 2014-07-31 MED ORDER — ONDANSETRON 8 MG/50ML IVPB (CHCC)
8.0000 mg | Freq: Once | INTRAVENOUS | Status: AC
  Administered 2014-07-31: 8 mg via INTRAVENOUS

## 2014-07-31 MED ORDER — DEXTROSE 5 % IV SOLN
Freq: Once | INTRAVENOUS | Status: AC
  Administered 2014-07-31: 10:00:00 via INTRAVENOUS

## 2014-07-31 MED ORDER — ONDANSETRON 8 MG/NS 50 ML IVPB
INTRAVENOUS | Status: AC
  Filled 2014-07-31: qty 8

## 2014-07-31 MED ORDER — DEXAMETHASONE SODIUM PHOSPHATE 10 MG/ML IJ SOLN
10.0000 mg | Freq: Once | INTRAMUSCULAR | Status: AC
  Administered 2014-07-31: 10 mg via INTRAVENOUS

## 2014-07-31 MED ORDER — SODIUM CHLORIDE 0.9 % IV SOLN
2400.0000 mg/m2 | INTRAVENOUS | Status: DC
  Administered 2014-07-31: 4150 mg via INTRAVENOUS
  Filled 2014-07-31: qty 83

## 2014-07-31 MED ORDER — LEUCOVORIN CALCIUM INJECTION 350 MG
400.0000 mg/m2 | Freq: Once | INTRAVENOUS | Status: AC
  Administered 2014-07-31: 692 mg via INTRAVENOUS
  Filled 2014-07-31: qty 34.6

## 2014-07-31 NOTE — Patient Instructions (Signed)
[facility] Discharge Instructions for Patients Receiving Chemotherapy  Today you received the following chemotherapy agents:  Oxaliplatin, Leucovorin, 5FU and Avastin  To help prevent nausea and vomiting after your treatment, we encourage you to take your nausea medication as ordered per MD.   If you develop nausea and vomiting that is not controlled by your nausea medication, call the clinic.   BELOW ARE SYMPTOMS THAT SHOULD BE REPORTED IMMEDIATELY:  *FEVER GREATER THAN 100.5 F  *CHILLS WITH OR WITHOUT FEVER  NAUSEA AND VOMITING THAT IS NOT CONTROLLED WITH YOUR NAUSEA MEDICATION  *UNUSUAL SHORTNESS OF BREATH  *UNUSUAL BRUISING OR BLEEDING  TENDERNESS IN MOUTH AND THROAT WITH OR WITHOUT PRESENCE OF ULCERS  *URINARY PROBLEMS  *BOWEL PROBLEMS  UNUSUAL RASH Items with * indicate a potential emergency and should be followed up as soon as possible.  Feel free to call the clinic you have any questions or concerns. The clinic phone number is [phone].

## 2014-08-02 ENCOUNTER — Ambulatory Visit (HOSPITAL_BASED_OUTPATIENT_CLINIC_OR_DEPARTMENT_OTHER): Payer: Medicaid Other

## 2014-08-02 DIAGNOSIS — C182 Malignant neoplasm of ascending colon: Secondary | ICD-10-CM

## 2014-08-02 DIAGNOSIS — C189 Malignant neoplasm of colon, unspecified: Secondary | ICD-10-CM

## 2014-08-02 MED ORDER — HEPARIN SOD (PORK) LOCK FLUSH 100 UNIT/ML IV SOLN
500.0000 [IU] | Freq: Once | INTRAVENOUS | Status: AC | PRN
  Administered 2014-08-02: 500 [IU]
  Filled 2014-08-02: qty 5

## 2014-08-02 MED ORDER — SODIUM CHLORIDE 0.9 % IJ SOLN
10.0000 mL | INTRAMUSCULAR | Status: DC | PRN
  Administered 2014-08-02: 10 mL
  Filled 2014-08-02: qty 10

## 2014-08-03 ENCOUNTER — Telehealth: Payer: Self-pay | Admitting: *Deleted

## 2014-08-03 ENCOUNTER — Other Ambulatory Visit: Payer: Self-pay | Admitting: Hematology and Oncology

## 2014-08-03 DIAGNOSIS — C787 Secondary malignant neoplasm of liver and intrahepatic bile duct: Secondary | ICD-10-CM

## 2014-08-03 MED ORDER — HYDROMORPHONE HCL 4 MG PO TABS: 4.0000 mg | ORAL_TABLET | Freq: Three times a day (TID) | ORAL | Status: DC | PRN

## 2014-08-03 NOTE — Telephone Encounter (Signed)
-----   Message from Heath Lark, MD sent at 08/03/2014 12:53 PM EST ----- Her pain clinic is managing her pain and she is on suboxone. Please kindly ask her to call them ----- Message -----    From: Patton Salles, RN    Sent: 08/03/2014  12:22 PM      To: Heath Lark, MD  Pt left message stating having pain in leg from knee to hip, not relieved by advil. Wanting to know what she can take.

## 2014-08-03 NOTE — Telephone Encounter (Signed)
Pt states she is not seeing pain clinic- Dr Franchot Mimes said she can take what Dr Alvy Bimler prescribes and not take suboxone.   Dr Alvy Bimler sent rx Dilaudid

## 2014-08-14 ENCOUNTER — Encounter: Payer: Self-pay | Admitting: Hematology and Oncology

## 2014-08-14 ENCOUNTER — Telehealth: Payer: Self-pay | Admitting: Hematology and Oncology

## 2014-08-14 ENCOUNTER — Ambulatory Visit (HOSPITAL_BASED_OUTPATIENT_CLINIC_OR_DEPARTMENT_OTHER): Payer: Medicaid Other | Admitting: Hematology and Oncology

## 2014-08-14 ENCOUNTER — Ambulatory Visit (HOSPITAL_BASED_OUTPATIENT_CLINIC_OR_DEPARTMENT_OTHER): Payer: Medicaid Other

## 2014-08-14 ENCOUNTER — Other Ambulatory Visit (HOSPITAL_BASED_OUTPATIENT_CLINIC_OR_DEPARTMENT_OTHER): Payer: Medicaid Other

## 2014-08-14 VITALS — BP 128/77 | HR 50 | Temp 97.5°F | Resp 20 | Ht 65.0 in | Wt 174.4 lb

## 2014-08-14 DIAGNOSIS — N183 Chronic kidney disease, stage 3 unspecified: Secondary | ICD-10-CM

## 2014-08-14 DIAGNOSIS — R911 Solitary pulmonary nodule: Secondary | ICD-10-CM

## 2014-08-14 DIAGNOSIS — C787 Secondary malignant neoplasm of liver and intrahepatic bile duct: Secondary | ICD-10-CM

## 2014-08-14 DIAGNOSIS — Z5111 Encounter for antineoplastic chemotherapy: Secondary | ICD-10-CM | POA: Diagnosis not present

## 2014-08-14 DIAGNOSIS — C189 Malignant neoplasm of colon, unspecified: Secondary | ICD-10-CM

## 2014-08-14 DIAGNOSIS — E1340 Other specified diabetes mellitus with diabetic neuropathy, unspecified: Secondary | ICD-10-CM

## 2014-08-14 DIAGNOSIS — T50905A Adverse effect of unspecified drugs, medicaments and biological substances, initial encounter: Secondary | ICD-10-CM

## 2014-08-14 DIAGNOSIS — C182 Malignant neoplasm of ascending colon: Secondary | ICD-10-CM

## 2014-08-14 DIAGNOSIS — Z5112 Encounter for antineoplastic immunotherapy: Secondary | ICD-10-CM

## 2014-08-14 DIAGNOSIS — Z720 Tobacco use: Secondary | ICD-10-CM

## 2014-08-14 DIAGNOSIS — K5909 Other constipation: Secondary | ICD-10-CM

## 2014-08-14 DIAGNOSIS — D6959 Other secondary thrombocytopenia: Secondary | ICD-10-CM

## 2014-08-14 DIAGNOSIS — F1120 Opioid dependence, uncomplicated: Secondary | ICD-10-CM

## 2014-08-14 LAB — COMPREHENSIVE METABOLIC PANEL (CC13)
ALT: 28 U/L (ref 0–55)
AST: 41 U/L — ABNORMAL HIGH (ref 5–34)
Albumin: 3.4 g/dL — ABNORMAL LOW (ref 3.5–5.0)
Alkaline Phosphatase: 110 U/L (ref 40–150)
Anion Gap: 10 mEq/L (ref 3–11)
BUN: 15.8 mg/dL (ref 7.0–26.0)
CO2: 29 meq/L (ref 22–29)
Calcium: 9 mg/dL (ref 8.4–10.4)
Chloride: 99 mEq/L (ref 98–109)
Creatinine: 1.6 mg/dL — ABNORMAL HIGH (ref 0.6–1.1)
EGFR: 35 mL/min/{1.73_m2} — ABNORMAL LOW (ref >90)
Glucose: 267 mg/dl — ABNORMAL HIGH (ref 70–140)
POTASSIUM: 4.4 meq/L (ref 3.5–5.1)
SODIUM: 138 meq/L (ref 136–145)
TOTAL PROTEIN: 6.8 g/dL (ref 6.4–8.3)
Total Bilirubin: 0.42 mg/dL (ref 0.20–1.20)

## 2014-08-14 LAB — CBC WITH DIFFERENTIAL/PLATELET
BASO%: 0.9 % (ref 0.0–2.0)
Basophils Absolute: 0 10*3/uL (ref 0.0–0.1)
EOS%: 2.5 % (ref 0.0–7.0)
Eosinophils Absolute: 0.1 10*3/uL (ref 0.0–0.5)
HCT: 41 % (ref 34.8–46.6)
HEMOGLOBIN: 13.2 g/dL (ref 11.6–15.9)
LYMPH%: 30.2 % (ref 14.0–49.7)
MCH: 28.1 pg (ref 25.1–34.0)
MCHC: 32.2 g/dL (ref 31.5–36.0)
MCV: 87.2 fL (ref 79.5–101.0)
MONO#: 0.5 10*3/uL (ref 0.1–0.9)
MONO%: 12.4 % (ref 0.0–14.0)
NEUT#: 2.2 10*3/uL (ref 1.5–6.5)
NEUT%: 54 % (ref 38.4–76.8)
Platelets: 123 10*3/uL — ABNORMAL LOW (ref 145–400)
RBC: 4.7 10*6/uL (ref 3.70–5.45)
RDW: 17.8 % — AB (ref 11.2–14.5)
WBC: 4 10*3/uL (ref 3.9–10.3)
lymph#: 1.2 10*3/uL (ref 0.9–3.3)

## 2014-08-14 MED ORDER — DEXAMETHASONE SODIUM PHOSPHATE 10 MG/ML IJ SOLN
10.0000 mg | Freq: Once | INTRAMUSCULAR | Status: AC
  Administered 2014-08-14: 10 mg via INTRAVENOUS

## 2014-08-14 MED ORDER — SODIUM CHLORIDE 0.9 % IV SOLN
2400.0000 mg/m2 | INTRAVENOUS | Status: DC
  Administered 2014-08-14: 4150 mg via INTRAVENOUS
  Filled 2014-08-14: qty 83

## 2014-08-14 MED ORDER — SODIUM CHLORIDE 0.9 % IV SOLN
5.0000 mg/kg | Freq: Once | INTRAVENOUS | Status: AC
  Administered 2014-08-14: 375 mg via INTRAVENOUS
  Filled 2014-08-14: qty 15

## 2014-08-14 MED ORDER — DEXAMETHASONE SODIUM PHOSPHATE 10 MG/ML IJ SOLN
INTRAMUSCULAR | Status: AC
  Filled 2014-08-14: qty 1

## 2014-08-14 MED ORDER — OXALIPLATIN CHEMO INJECTION 100 MG/20ML
50.0000 mg/m2 | Freq: Once | INTRAVENOUS | Status: AC
  Administered 2014-08-14: 85 mg via INTRAVENOUS
  Filled 2014-08-14: qty 17

## 2014-08-14 MED ORDER — SODIUM CHLORIDE 0.9 % IV SOLN
Freq: Once | INTRAVENOUS | Status: AC
  Administered 2014-08-14: 10:00:00 via INTRAVENOUS

## 2014-08-14 MED ORDER — LEUCOVORIN CALCIUM INJECTION 350 MG
400.0000 mg/m2 | Freq: Once | INTRAVENOUS | Status: AC
  Administered 2014-08-14: 692 mg via INTRAVENOUS
  Filled 2014-08-14: qty 34.6

## 2014-08-14 MED ORDER — ONDANSETRON 8 MG/NS 50 ML IVPB
INTRAVENOUS | Status: AC
  Filled 2014-08-14: qty 8

## 2014-08-14 MED ORDER — DEXTROSE 5 % IV SOLN
Freq: Once | INTRAVENOUS | Status: AC
  Administered 2014-08-14: 12:00:00 via INTRAVENOUS

## 2014-08-14 MED ORDER — ONDANSETRON 8 MG/50ML IVPB (CHCC)
8.0000 mg | Freq: Once | INTRAVENOUS | Status: AC
  Administered 2014-08-14: 8 mg via INTRAVENOUS

## 2014-08-14 NOTE — Assessment & Plan Note (Signed)
The patient requests a pain medicine recently for leg pain. She is also attempting to wean herself off pain medicine. I recommend she follows closely with the pain clinic for medication adjustment.

## 2014-08-14 NOTE — Assessment & Plan Note (Signed)
This is likely due to recent treatment. The patient denies recent history of bleeding such as epistaxis, hematuria or hematochezia. She is asymptomatic from the low platelet count. I will observe for now.  I will proceed with treatment today without delay and without further dose adjustment.

## 2014-08-14 NOTE — Telephone Encounter (Signed)
gv and printed appt sched and avs fo rpt for Jan and Feb....sed added tx......gv pt barium

## 2014-08-14 NOTE — Assessment & Plan Note (Signed)
Her kidney function tests is unaffected by recent chemotherapy. Continue close observation.

## 2014-08-14 NOTE — Assessment & Plan Note (Signed)
Clinically, she is responding to treatment. However, she is dependent on magnesium citrate and have struggle with constipation lately. Continue to same.

## 2014-08-14 NOTE — Assessment & Plan Note (Signed)
Recent imaging study show positive response to treatment. I will add CT scan of the chest with next imaging to evaluate for lung nodules. Overall, she tolerated treatment well from persistent constipation and neuropathy. I will resume Avastin with each cycle of treatment. I plan to repeat imaging study before see her back next visit

## 2014-08-14 NOTE — Assessment & Plan Note (Signed)
This is slightly worse while she is on oxaliplatin. We shall continue same dose of chemotherapy without dose adjustment.

## 2014-08-14 NOTE — Patient Instructions (Signed)
[facility] Discharge Instructions for Patients Receiving Chemotherapy  Today you received the following chemotherapy agents :  Avastibn, Leucovorin, Oxaliplatin, 5FU/pump  To help prevent nausea and vomiting after your treatment, we encourage you to take your nausea medication. If you develop nausea and vomiting that is not controlled by your nausea medication, call the clinic.   BELOW ARE SYMPTOMS THAT SHOULD BE REPORTED IMMEDIATELY:  *FEVER GREATER THAN 100.5 F  *CHILLS WITH OR WITHOUT FEVER  NAUSEA AND VOMITING THAT IS NOT CONTROLLED WITH YOUR NAUSEA MEDICATION  *UNUSUAL SHORTNESS OF BREATH  *UNUSUAL BRUISING OR BLEEDING  TENDERNESS IN MOUTH AND THROAT WITH OR WITHOUT PRESENCE OF ULCERS  *URINARY PROBLEMS  *BOWEL PROBLEMS  UNUSUAL RASH Items with * indicate a potential emergency and should be followed up as soon as possible.  Feel free to call the clinic you have any questions or concerns. The clinic phone number is [phone].

## 2014-08-14 NOTE — Progress Notes (Signed)
[place] OFFICE PROGRESS NOTE  Patient Care Team: Elberta Leatherwood, MD as PCP - General Windy Kalata, MD as Consulting Physician (Nephrology) Leatrice Jewels Rayetta Pigg, MD as Consulting Physician (Psychiatry) Heath Lark, MD as Consulting Physician (Hematology and Oncology)  SUMMARY OF ONCOLOGIC HISTORY: Oncology History   Colon cancer, KRAS positive   Primary site: Colon and Rectum (Right)   Staging method: AJCC 7th Edition   Clinical: (T4b, N2b, M1) signed by Heath Lark, MD on 08/05/2013  9:52 AM   Pathologic: Stage IVB (T4b, N2b, M1b) signed by Heath Lark, MD on 08/05/2013 10:21 AM   Summary: Stage IVB (T4b, N2b, M1b)      Colon cancer   07/15/2013 - 07/25/2013 Hospital Admission The patient presented to the hospital with pain in her abdomen and underwent surgery for colon cancer   07/15/2013 Imaging CT scan of the abdomen show bowel dilatation suspicious for malignancy   07/17/2013 Tumor Marker Preoperative CEA was 2.2, normal   07/19/2013 Procedure Colonoscopy revealed ascending colon lesion with significant stricture and biopsy showed adenocarcinoma   07/20/2013 Surgery She underwent right hemicolectomy, cholecystectomy, liver biopsy, excision of abdominal wall mass and omental mass. During surgery, she was also found to have drop metastasis   08/29/2013 Imaging Repeat CT scan show persistent liver metastasis and new omental metastasis   08/31/2013 - 02/10/2014 Chemotherapy The patient was started on palliative chemotherapy with FOLFOX, bolus 5-FU committed and oxaliplatin dose reduced due to  peripheral neuropathy. She has completed 12 cycles of treatment.   11/22/2013 Imaging Repeat CT scan showed near complete response to treatment.   11/23/2013 - 02/10/2014 Chemotherapy Avastin was added to her chemotherapy regimen   12/21/2013 Adverse Reaction Oxaliplatin was omitted due to worsening peripheral neuropathy.   02/20/2014 Imaging Repeat CT scan showed near complete  resolution of all disease.   05/15/2014 Imaging Repeat CT scan showed peritoneal metastasis and recurrence of disease   05/22/2014 -  Chemotherapy Modify dosings of FOLFOX was restarted.   07/14/2014 Imaging CT scan of the abdomen and pelvis show positive response to treatment.    INTERVAL HISTORY: Please see below for problem oriented charting. She is seen prior to chemotherapy. She continues to have significant pain in her thigh. This isn't relieved by pain medicine. She denies any new neurological deficit. She continues to have peripheral neuropathy, unchanged compared to her baseline.  REVIEW OF SYSTEMS:   Constitutional: Denies fevers, chills or abnormal weight loss Eyes: Denies blurriness of vision Ears, nose, mouth, throat, and face: Denies mucositis or sore throat Respiratory: Denies cough, dyspnea or wheezes Cardiovascular: Denies palpitation, chest discomfort or lower extremity swelling Gastrointestinal:  Denies nausea, heartburn or change in bowel habits Skin: Denies abnormal skin rashes Lymphatics: Denies new lymphadenopathy or easy bruising Behavioral/Psych: Mood is stable, no new changes  All other systems were reviewed with the patient and are negative.  I have reviewed the past medical history, past surgical history, social history and family history with the patient and they are unchanged from previous note.  ALLERGIES:  is allergic to naproxen.  MEDICATIONS:  Current Outpatient Prescriptions  Medication Sig Dispense Refill  . atorvastatin (LIPITOR) 40 MG tablet Take 40 mg by mouth daily.    . clonazePAM (KLONOPIN) 1 MG tablet Take 1 mg by mouth 3 (three) times daily.     . diazepam (VALIUM) 10 MG tablet Take 10 mg by mouth every 8 (eight) hours as needed for anxiety.    . furosemide (LASIX) 40  MG tablet Take 1 tablet (40 mg total) by mouth 2 (two) times daily. 60 tablet 5  . gabapentin (NEURONTIN) 800 MG tablet Take 800 mg by mouth 3 (three) times daily.    Marland Kitchen  glucose blood (ACCU-CHEK AVIVA PLUS) test strip USE AS DIRECTED 100 each 10  . HYDROmorphone (DILAUDID) 4 MG tablet Take 1 tablet (4 mg total) by mouth 3 (three) times daily as needed for severe pain. 60 tablet 0  . HYDROXYZINE PAMOATE PO Take 25 mg by mouth 3 (three) times daily.    . insulin glargine (LANTUS) 100 UNIT/ML injection Inject 27 Units into the skin daily.    . insulin lispro (HUMALOG) 100 UNIT/ML injection Inject 1-10 Units into the skin 3 (three) times daily before meals.    . lamoTRIgine (LAMICTAL) 100 MG tablet Take 200 mg by mouth every morning.     . lidocaine-prilocaine (EMLA) cream Apply 1 application topically as needed (for pain).    Marland Kitchen omeprazole (PRILOSEC) 20 MG capsule Take 20 mg by mouth every morning.    . polyethylene glycol powder (GLYCOLAX/MIRALAX) powder Take 17 g by mouth 2 (two) times daily as needed. 3350 g 3  . ROZEREM 8 MG tablet Take 8 mg by mouth at bedtime.  0  . senna-docusate (SENOKOT-S) 8.6-50 MG per tablet Take 1 tablet by mouth 2 (two) times daily. 90 tablet 3  . ziprasidone (GEODON) 80 MG capsule Take 160 mg by mouth at bedtime.     . Buprenorphine HCl-Naloxone HCl (SUBOXONE SL) Place 8 mg under the tongue 3 (three) times daily.     No current facility-administered medications for this visit.   Facility-Administered Medications Ordered in Other Visits  Medication Dose Route Frequency Provider Last Rate Last Dose  . dextrose 5 % solution   Intravenous Once Heath Lark, MD      . fluorouracil (ADRUCIL) 4,150 mg in sodium chloride 0.9 % 150 mL chemo infusion  2,400 mg/m2 (Treatment Plan Adjusted) Intravenous 1 day or 1 dose Heath Lark, MD      . leucovorin 692 mg in dextrose 5 % 250 mL infusion  400 mg/m2 (Treatment Plan Adjusted) Intravenous Once Heath Lark, MD 142 mL/hr at 08/14/14 1140 692 mg at 08/14/14 1140  . oxaliplatin (ELOXATIN) 85 mg in dextrose 5 % 500 mL chemo infusion  50 mg/m2 (Treatment Plan Adjusted) Intravenous Once Heath Lark, MD 259  mL/hr at 08/14/14 1140 85 mg at 08/14/14 1140    PHYSICAL EXAMINATION: ECOG PERFORMANCE STATUS: 1 - Symptomatic but completely ambulatory  Filed Vitals:   08/14/14 0914  BP: 128/77  Pulse: 50  Temp: 97.5 F (36.4 C)  Resp: 20   Filed Weights   08/14/14 0914  Weight: 174 lb 6.4 oz (79.107 kg)    GENERAL:alert, no distress and comfortable SKIN: skin color, texture, turgor are normal, no rashes or significant lesions EYES: normal, Conjunctiva are pink and non-injected, sclera clear OROPHARYNX:no exudate, no erythema and lips, buccal mucosa, and tongue normal  NECK: supple, thyroid normal size, non-tender, without nodularity LYMPH:  no palpable lymphadenopathy in the cervical, axillary or inguinal LUNGS: clear to auscultation and percussion with normal breathing effort HEART: regular rate & rhythm and no murmurs and no lower extremity edema ABDOMEN:abdomen soft, non-tender and normal bowel sounds Musculoskeletal:no cyanosis of digits and no clubbing  NEURO: alert & oriented x 3 with fluent speech, no focal motor/sensory deficits  LABORATORY DATA:  I have reviewed the data as listed    Component Value Date/Time  NA 138 08/14/2014 0851   NA 142 05/14/2014 2233   K 4.4 08/14/2014 0851   K 3.8 05/14/2014 2233   CL 99 05/14/2014 2233   CO2 29 08/14/2014 0851   CO2 29 05/14/2014 2233   GLUCOSE 267* 08/14/2014 0851   GLUCOSE 88 05/14/2014 2233   BUN 15.8 08/14/2014 0851   BUN 8 05/14/2014 2233   CREATININE 1.6* 08/14/2014 0851   CREATININE 0.98 05/14/2014 2233   CREATININE 1.38* 05/26/2013 1703   CALCIUM 9.0 08/14/2014 0851   CALCIUM 9.1 05/14/2014 2233   PROT 6.8 08/14/2014 0851   PROT 7.1 05/14/2014 2233   ALBUMIN 3.4* 08/14/2014 0851   ALBUMIN 3.1* 05/14/2014 2233   AST 41* 08/14/2014 0851   AST 28 05/14/2014 2233   ALT 28 08/14/2014 0851   ALT 18 05/14/2014 2233   ALKPHOS 110 08/14/2014 0851   ALKPHOS 124* 05/14/2014 2233   BILITOT 0.42 08/14/2014 0851    BILITOT 0.2* 05/14/2014 2233   GFRNONAA 65* 05/14/2014 2233   GFRNONAA 44* 05/26/2013 1703   GFRAA 76* 05/14/2014 2233   GFRAA 51* 05/26/2013 1703    No results found for: SPEP, UPEP  Lab Results  Component Value Date   WBC 4.0 08/14/2014   NEUTROABS 2.2 08/14/2014   HGB 13.2 08/14/2014   HCT 41.0 08/14/2014   MCV 87.2 08/14/2014   PLT 123* 08/14/2014      Chemistry      Component Value Date/Time   NA 138 08/14/2014 0851   NA 142 05/14/2014 2233   K 4.4 08/14/2014 0851   K 3.8 05/14/2014 2233   CL 99 05/14/2014 2233   CO2 29 08/14/2014 0851   CO2 29 05/14/2014 2233   BUN 15.8 08/14/2014 0851   BUN 8 05/14/2014 2233   CREATININE 1.6* 08/14/2014 0851   CREATININE 0.98 05/14/2014 2233   CREATININE 1.38* 05/26/2013 1703      Component Value Date/Time   CALCIUM 9.0 08/14/2014 0851   CALCIUM 9.1 05/14/2014 2233   ALKPHOS 110 08/14/2014 0851   ALKPHOS 124* 05/14/2014 2233   AST 41* 08/14/2014 0851   AST 28 05/14/2014 2233   ALT 28 08/14/2014 0851   ALT 18 05/14/2014 2233   BILITOT 0.42 08/14/2014 0851   BILITOT 0.2* 05/14/2014 2233      ASSESSMENT & PLAN:  Colon cancer Recent imaging study show positive response to treatment. I will add CT scan of the chest with next imaging to evaluate for lung nodules. Overall, she tolerated treatment well from persistent constipation and neuropathy. I will resume Avastin with each cycle of treatment. I plan to repeat imaging study before see her back next visit      CKD (chronic kidney disease), stage III Her kidney function tests is unaffected by recent chemotherapy. Continue close observation.  Constipation Clinically, she is responding to treatment. However, she is dependent on magnesium citrate and have struggle with constipation lately. Continue to same.   Lung nodule seen on imaging study I plan to add CT scan of the chest with next imaging study for evaluation. She is not symptomatic. The patient is attempting  to quit smoking.  Neuropathy due to secondary diabetes This is slightly worse while she is on oxaliplatin. We shall continue same dose of chemotherapy without dose adjustment.  Opioid dependence The patient requests a pain medicine recently for leg pain. She is also attempting to wean herself off pain medicine. I recommend she follows closely with the pain clinic for medication adjustment.  Thrombocytopenia due to drugs This is likely due to recent treatment. The patient denies recent history of bleeding such as epistaxis, hematuria or hematochezia. She is asymptomatic from the low platelet count. I will observe for now.  I will proceed with treatment today without delay and without further dose adjustment.   Orders Placed This Encounter  Procedures  . CT Chest W Contrast    Standing Status: Future     Number of Occurrences:      Standing Expiration Date: 10/14/2015    Order Specific Question:  Reason for Exam (SYMPTOM  OR DIAGNOSIS REQUIRED)    Answer:  metastatic colon ca, assess response to Rx    Order Specific Question:  Is the patient pregnant?    Answer:  No    Order Specific Question:  Preferred imaging location?    Answer:  [facility]  . CT Abdomen Pelvis W Contrast    Standing Status: Future     Number of Occurrences:      Standing Expiration Date: 11/14/2015    Order Specific Question:  Reason for Exam (SYMPTOM  OR DIAGNOSIS REQUIRED)    Answer:  metastatic colon ca, assess response to Rx    Order Specific Question:  Is the patient pregnant?    Answer:  No    Order Specific Question:  Preferred imaging location?    Answer:  [facility]   All questions were answered. The patient knows to call the clinic with any problems, questions or concerns. No barriers to learning was detected. I spent 30 minutes counseling the patient face to face. The total time spent in the appointment was 40 minutes and more than 50% was on counseling and review of test  results     [facility], [place], MD 08/14/2014 1:39 PM

## 2014-08-14 NOTE — Assessment & Plan Note (Signed)
I plan to add CT scan of the chest with next imaging study for evaluation. She is not symptomatic. The patient is attempting to quit smoking.

## 2014-08-16 ENCOUNTER — Ambulatory Visit (HOSPITAL_BASED_OUTPATIENT_CLINIC_OR_DEPARTMENT_OTHER): Payer: Medicaid Other

## 2014-08-16 DIAGNOSIS — C7989 Secondary malignant neoplasm of other specified sites: Secondary | ICD-10-CM

## 2014-08-16 DIAGNOSIS — C189 Malignant neoplasm of colon, unspecified: Secondary | ICD-10-CM

## 2014-08-16 DIAGNOSIS — C786 Secondary malignant neoplasm of retroperitoneum and peritoneum: Secondary | ICD-10-CM

## 2014-08-16 DIAGNOSIS — C182 Malignant neoplasm of ascending colon: Secondary | ICD-10-CM

## 2014-08-16 MED ORDER — SODIUM CHLORIDE 0.9 % IJ SOLN
10.0000 mL | INTRAMUSCULAR | Status: DC | PRN
  Administered 2014-08-16: 10 mL
  Filled 2014-08-16: qty 10

## 2014-08-16 MED ORDER — HEPARIN SOD (PORK) LOCK FLUSH 100 UNIT/ML IV SOLN
500.0000 [IU] | Freq: Once | INTRAVENOUS | Status: AC | PRN
  Administered 2014-08-16: 500 [IU]
  Filled 2014-08-16: qty 5

## 2014-08-16 NOTE — Patient Instructions (Signed)
Fluorouracil, 5FU; Diclofenac topical cream What is this medicine? FLUOROURACIL; DICLOFENAC (flure oh YOOR a sil; dye KLOE fen ak) is a combination of a topical chemotherapy agent and non-steroidal anti-inflammatory drug (NSAID). It is used on the skin to treat skin cancer and skin conditions that could become cancer. This medicine may be used for other purposes; ask your health care provider or pharmacist if you have questions. COMMON BRAND NAME(S): FLUORAC What should I tell my health care provider before I take this medicine? They need to know if you have any of these conditions: -bleeding problems -cigarette smoker -DPD enzyme deficiency -heart disease -high blood pressure -if you frequently drink alcohol containing drinks -kidney disease -liver disease -open or infected skin -stomach problems -swelling or open sores at the treatment site -recent or planned coronary artery bypass graft (CABG) surgery -an unusual or allergic reaction to fluorouracil, diclofenac, aspirin, other NSAIDs, other medicines, foods, dyes, or preservatives -pregnant or trying to get pregnant -breast-feeding How should I use this medicine? This medicine is only for use on the skin. Follow the directions on the prescription label. Wash hands before and after use. Wash affected area and gently pat dry. To apply this medicine use a cotton-tipped applicator, or use gloves if applying with fingertips. If applied with unprotected fingertips, it is very important to wash your hands well after you apply this medicine. Avoid applying to the eyes, nose, or mouth. Apply enough medicine to cover the affected area. You can cover the area with a light gauze dressing, but do not use tight or air-tight dressings. Finish the full course prescribed by your doctor or health care professional, even if you think your condition is better. Do not stop taking except on the advice of your doctor or health care professional. Talk to your  pediatrician regarding the use of this medicine in children. Special care may be needed. Overdosage: If you think you've taken too much of this medicine contact a poison control center or emergency room at once. Overdosage: If you think you have taken too much of this medicine contact a poison control center or emergency room at once. NOTE: This medicine is only for you. Do not share this medicine with others. What if I miss a dose? If you miss a dose, apply it as soon as you can. If it is almost time for your next dose, only use that dose. Do not apply extra doses. Contact your doctor or health care professional if you miss more than one dose. What may interact with this medicine? Interactions are not expected. Do not use any other skin products without telling your doctor or health care professional. This list may not describe all possible interactions. Give your health care provider a list of all the medicines, herbs, non-prescription drugs, or dietary supplements you use. Also tell them if you smoke, drink alcohol, or use illegal drugs. Some items may interact with your medicine. What should I watch for while using this medicine? Visit your doctor or health care professional for checks on your progress. You will need to use this medicine for 2 to 6 weeks. This may be longer depending on the condition being treated. You may not see full healing for another 1 to 2 months after you stop using the medicine. Treated areas of skin can look unsightly during and for several weeks after treatment with this medicine. This medicine can make you more sensitive to the sun. Keep out of the sun. If you cannot avoid being in   the sun, wear protective clothing and use sunscreen. Do not use sun lamps or tanning beds/booths. Where should I keep my What side effects may I notice from receiving this medicine? Side effects that you should report to your doctor or health care professional as soon as possible: -allergic  reactions like skin rash, itching or hives, swelling of the face, lips, or tongue -black or bloody stools, blood in the urine or vomit -blurred vision -chest pain -difficulty breathing or wheezing -redness, blistering, peeling or loosening of the skin, including inside the mouth -severe redness and swelling of normal skin -slurred speech or weakness on one side of the body -trouble passing urine or change in the amount of urine -unexplained weight gain or swelling -unusually weak or tired -yellowing of eyes or skin Side effects that usually do not require medical attention (Report these to your doctor or health care professional if they continue or are bothersome.): -increased sensitivity of the skin to sun and ultraviolet light -pain and burning of the affected area -scaling or swelling of the affected area -skin rash, itching of the affected area -tenderness This list may not describe all possible side effects. Call your doctor for medical advice about side effects. You may report side effects to FDA at 1-800-FDA-1088. Where should I keep my medicine? Keep out of the reach of children. Store at room temperature between 20 and 25 degrees C (68 and 77 degrees F). Throw away any unused medicine after the expiration date. NOTE: This sheet is a summary. It may not cover all possible information. If you have questions about this medicine, talk to your doctor, pharmacist, or health care provider.  2015, [employer]vier/Gold Standard. (2013-11-21 11:09:58)  

## 2014-08-17 ENCOUNTER — Encounter (HOSPITAL_COMMUNITY): Payer: Self-pay | Admitting: Gastroenterology

## 2014-08-24 ENCOUNTER — Other Ambulatory Visit: Payer: Self-pay | Admitting: *Deleted

## 2014-08-25 ENCOUNTER — Ambulatory Visit: Payer: Self-pay | Admitting: Family Medicine

## 2014-08-27 MED ORDER — INSULIN GLARGINE 100 UNIT/ML ~~LOC~~ SOLN
27.0000 [IU] | Freq: Every day | SUBCUTANEOUS | Status: DC
Start: 2014-08-27 — End: 2014-11-08

## 2014-08-27 MED ORDER — ATORVASTATIN CALCIUM 40 MG PO TABS: 40.0000 mg | ORAL_TABLET | Freq: Every day | ORAL | Status: AC

## 2014-08-28 ENCOUNTER — Other Ambulatory Visit (HOSPITAL_BASED_OUTPATIENT_CLINIC_OR_DEPARTMENT_OTHER): Payer: Medicaid Other

## 2014-08-28 ENCOUNTER — Ambulatory Visit (HOSPITAL_BASED_OUTPATIENT_CLINIC_OR_DEPARTMENT_OTHER): Payer: Medicaid Other

## 2014-08-28 DIAGNOSIS — C182 Malignant neoplasm of ascending colon: Secondary | ICD-10-CM

## 2014-08-28 DIAGNOSIS — C189 Malignant neoplasm of colon, unspecified: Secondary | ICD-10-CM

## 2014-08-28 DIAGNOSIS — C787 Secondary malignant neoplasm of liver and intrahepatic bile duct: Secondary | ICD-10-CM

## 2014-08-28 DIAGNOSIS — Z5112 Encounter for antineoplastic immunotherapy: Secondary | ICD-10-CM

## 2014-08-28 DIAGNOSIS — C786 Secondary malignant neoplasm of retroperitoneum and peritoneum: Secondary | ICD-10-CM

## 2014-08-28 DIAGNOSIS — Z5111 Encounter for antineoplastic chemotherapy: Secondary | ICD-10-CM

## 2014-08-28 LAB — CBC WITH DIFFERENTIAL/PLATELET
BASO%: 0.7 % (ref 0.0–2.0)
BASOS ABS: 0 10*3/uL (ref 0.0–0.1)
EOS%: 1.7 % (ref 0.0–7.0)
Eosinophils Absolute: 0.1 10*3/uL (ref 0.0–0.5)
HCT: 42.3 % (ref 34.8–46.6)
HGB: 13.5 g/dL (ref 11.6–15.9)
LYMPH%: 26.3 % (ref 14.0–49.7)
MCH: 27.6 pg (ref 25.1–34.0)
MCHC: 32 g/dL (ref 31.5–36.0)
MCV: 86.4 fL (ref 79.5–101.0)
MONO#: 0.6 10*3/uL (ref 0.1–0.9)
MONO%: 12.3 % (ref 0.0–14.0)
NEUT%: 59 % (ref 38.4–76.8)
NEUTROS ABS: 2.7 10*3/uL (ref 1.5–6.5)
PLATELETS: 144 10*3/uL — AB (ref 145–400)
RBC: 4.89 10*6/uL (ref 3.70–5.45)
RDW: 17.2 % — ABNORMAL HIGH (ref 11.2–14.5)
WBC: 4.5 10*3/uL (ref 3.9–10.3)
lymph#: 1.2 10*3/uL (ref 0.9–3.3)

## 2014-08-28 LAB — COMPREHENSIVE METABOLIC PANEL (CC13)
ALT: 24 U/L (ref 0–55)
AST: 26 U/L (ref 5–34)
Albumin: 3.6 g/dL (ref 3.5–5.0)
Alkaline Phosphatase: 117 U/L (ref 40–150)
Anion Gap: 14 mEq/L — ABNORMAL HIGH (ref 3–11)
BUN: 23.4 mg/dL (ref 7.0–26.0)
CO2: 26 mEq/L (ref 22–29)
Calcium: 9.4 mg/dL (ref 8.4–10.4)
Chloride: 103 mEq/L (ref 98–109)
Creatinine: 1.2 mg/dL — ABNORMAL HIGH (ref 0.6–1.1)
EGFR: 53 mL/min/{1.73_m2} — ABNORMAL LOW (ref >90)
Glucose: 199 mg/dl — ABNORMAL HIGH (ref 70–140)
Potassium: 4 mEq/L (ref 3.5–5.1)
Sodium: 143 mEq/L (ref 136–145)
Total Bilirubin: 0.36 mg/dL (ref 0.20–1.20)
Total Protein: 7.4 g/dL (ref 6.4–8.3)

## 2014-08-28 MED ORDER — LEUCOVORIN CALCIUM INJECTION 350 MG
400.0000 mg/m2 | Freq: Once | INTRAVENOUS | Status: AC
  Administered 2014-08-28: 692 mg via INTRAVENOUS
  Filled 2014-08-28: qty 34.6

## 2014-08-28 MED ORDER — ONDANSETRON 8 MG/NS 50 ML IVPB
INTRAVENOUS | Status: AC
  Filled 2014-08-28: qty 8

## 2014-08-28 MED ORDER — ONDANSETRON 8 MG/50ML IVPB (CHCC)
8.0000 mg | Freq: Once | INTRAVENOUS | Status: AC
  Administered 2014-08-28: 8 mg via INTRAVENOUS

## 2014-08-28 MED ORDER — DEXAMETHASONE SODIUM PHOSPHATE 10 MG/ML IJ SOLN
10.0000 mg | Freq: Once | INTRAMUSCULAR | Status: AC
  Administered 2014-08-28: 10 mg via INTRAVENOUS

## 2014-08-28 MED ORDER — OXALIPLATIN CHEMO INJECTION 100 MG/20ML
50.0000 mg/m2 | Freq: Once | INTRAVENOUS | Status: AC
  Administered 2014-08-28: 85 mg via INTRAVENOUS
  Filled 2014-08-28: qty 17

## 2014-08-28 MED ORDER — DEXTROSE 5 % IV SOLN
Freq: Once | INTRAVENOUS | Status: AC
  Administered 2014-08-28: 13:00:00 via INTRAVENOUS

## 2014-08-28 MED ORDER — SODIUM CHLORIDE 0.9 % IV SOLN
5.0000 mg/kg | Freq: Once | INTRAVENOUS | Status: AC
  Administered 2014-08-28: 375 mg via INTRAVENOUS
  Filled 2014-08-28: qty 15

## 2014-08-28 MED ORDER — DEXAMETHASONE SODIUM PHOSPHATE 10 MG/ML IJ SOLN
INTRAMUSCULAR | Status: AC
  Filled 2014-08-28: qty 1

## 2014-08-28 MED ORDER — SODIUM CHLORIDE 0.9 % IV SOLN
Freq: Once | INTRAVENOUS | Status: AC
  Administered 2014-08-28: 11:00:00 via INTRAVENOUS

## 2014-08-28 MED ORDER — FLUOROURACIL CHEMO INJECTION 5 GM/100ML
2400.0000 mg/m2 | INTRAVENOUS | Status: DC
  Administered 2014-08-28: 4150 mg via INTRAVENOUS
  Filled 2014-08-28: qty 83

## 2014-08-28 NOTE — Patient Instructions (Signed)
[facility] Discharge Instructions for Patients Receiving Chemotherapy  Today you received the following chemotherapy agents avastin, oxaliplatin, leucovorin, 23fu pump  To help prevent nausea and vomiting after your treatment, we encourage you to take your nausea medication if needed. May start about 530 this evening.   If you develop nausea and vomiting that is not controlled by your nausea medication, call the clinic.   BELOW ARE SYMPTOMS THAT SHOULD BE REPORTED IMMEDIATELY:  *FEVER GREATER THAN 100.5 F  *CHILLS WITH OR WITHOUT FEVER  NAUSEA AND VOMITING THAT IS NOT CONTROLLED WITH YOUR NAUSEA MEDICATION  *UNUSUAL SHORTNESS OF BREATH  *UNUSUAL BRUISING OR BLEEDING  TENDERNESS IN MOUTH AND THROAT WITH OR WITHOUT PRESENCE OF ULCERS  *URINARY PROBLEMS  *BOWEL PROBLEMS  UNUSUAL RASH Items with * indicate a potential emergency and should be followed up as soon as possible.  Feel free to call the clinic you have any questions or concerns. The clinic phone number is [phone].

## 2014-08-30 ENCOUNTER — Ambulatory Visit (HOSPITAL_BASED_OUTPATIENT_CLINIC_OR_DEPARTMENT_OTHER): Payer: Medicaid Other

## 2014-08-30 DIAGNOSIS — C189 Malignant neoplasm of colon, unspecified: Secondary | ICD-10-CM

## 2014-08-30 DIAGNOSIS — C182 Malignant neoplasm of ascending colon: Secondary | ICD-10-CM

## 2014-08-30 DIAGNOSIS — C787 Secondary malignant neoplasm of liver and intrahepatic bile duct: Secondary | ICD-10-CM

## 2014-08-30 DIAGNOSIS — C786 Secondary malignant neoplasm of retroperitoneum and peritoneum: Secondary | ICD-10-CM

## 2014-08-30 MED ORDER — HEPARIN SOD (PORK) LOCK FLUSH 100 UNIT/ML IV SOLN
500.0000 [IU] | Freq: Once | INTRAVENOUS | Status: AC | PRN
  Administered 2014-08-30: 500 [IU]
  Filled 2014-08-30: qty 5

## 2014-08-30 MED ORDER — SODIUM CHLORIDE 0.9 % IJ SOLN
10.0000 mL | INTRAMUSCULAR | Status: DC | PRN
  Administered 2014-08-30: 10 mL
  Filled 2014-08-30: qty 10

## 2014-09-06 ENCOUNTER — Encounter (HOSPITAL_COMMUNITY): Payer: Self-pay

## 2014-09-06 ENCOUNTER — Ambulatory Visit (HOSPITAL_COMMUNITY)
Admission: RE | Admit: 2014-09-06 | Discharge: 2014-09-06 | Disposition: A | Discharge status: Home / Self Care | Payer: Medicaid Other | Source: Ambulatory Visit | Attending: Hematology and Oncology | Admitting: Hematology and Oncology

## 2014-09-06 DIAGNOSIS — Z79899 Other long term (current) drug therapy: Secondary | ICD-10-CM | POA: Insufficient documentation

## 2014-09-06 DIAGNOSIS — C189 Malignant neoplasm of colon, unspecified: Secondary | ICD-10-CM

## 2014-09-06 DIAGNOSIS — C787 Secondary malignant neoplasm of liver and intrahepatic bile duct: Secondary | ICD-10-CM | POA: Insufficient documentation

## 2014-09-06 MED ORDER — IOHEXOL 300 MG/ML  SOLN
100.0000 mL | Freq: Once | INTRAMUSCULAR | Status: AC | PRN
  Administered 2014-09-06: 100 mL via INTRAVENOUS

## 2014-09-08 ENCOUNTER — Telehealth: Payer: Self-pay | Admitting: *Deleted

## 2014-09-08 ENCOUNTER — Other Ambulatory Visit (HOSPITAL_BASED_OUTPATIENT_CLINIC_OR_DEPARTMENT_OTHER): Payer: Medicaid Other

## 2014-09-08 ENCOUNTER — Ambulatory Visit (HOSPITAL_BASED_OUTPATIENT_CLINIC_OR_DEPARTMENT_OTHER): Payer: Medicaid Other | Admitting: Hematology and Oncology

## 2014-09-08 ENCOUNTER — Telehealth: Payer: Self-pay | Admitting: Hematology and Oncology

## 2014-09-08 ENCOUNTER — Encounter: Payer: Self-pay | Admitting: Hematology and Oncology

## 2014-09-08 VITALS — BP 124/66 | HR 58 | Temp 97.8°F | Resp 18 | Ht 66.0 in | Wt 167.6 lb

## 2014-09-08 DIAGNOSIS — K5909 Other constipation: Secondary | ICD-10-CM

## 2014-09-08 DIAGNOSIS — C189 Malignant neoplasm of colon, unspecified: Secondary | ICD-10-CM

## 2014-09-08 DIAGNOSIS — C182 Malignant neoplasm of ascending colon: Secondary | ICD-10-CM

## 2014-09-08 DIAGNOSIS — N183 Chronic kidney disease, stage 3 unspecified: Secondary | ICD-10-CM

## 2014-09-08 DIAGNOSIS — F17200 Nicotine dependence, unspecified, uncomplicated: Secondary | ICD-10-CM

## 2014-09-08 DIAGNOSIS — R911 Solitary pulmonary nodule: Secondary | ICD-10-CM

## 2014-09-08 DIAGNOSIS — C786 Secondary malignant neoplasm of retroperitoneum and peritoneum: Secondary | ICD-10-CM

## 2014-09-08 DIAGNOSIS — G893 Neoplasm related pain (acute) (chronic): Secondary | ICD-10-CM

## 2014-09-08 DIAGNOSIS — C787 Secondary malignant neoplasm of liver and intrahepatic bile duct: Secondary | ICD-10-CM

## 2014-09-08 DIAGNOSIS — Z720 Tobacco use: Secondary | ICD-10-CM

## 2014-09-08 DIAGNOSIS — G894 Chronic pain syndrome: Secondary | ICD-10-CM

## 2014-09-08 LAB — CBC WITH DIFFERENTIAL/PLATELET
BASO%: 1 % (ref 0.0–2.0)
BASOS ABS: 0 10*3/uL (ref 0.0–0.1)
EOS%: 1.5 % (ref 0.0–7.0)
Eosinophils Absolute: 0.1 10*3/uL (ref 0.0–0.5)
HCT: 41.7 % (ref 34.8–46.6)
HEMOGLOBIN: 13.5 g/dL (ref 11.6–15.9)
LYMPH%: 32.2 % (ref 14.0–49.7)
MCH: 28 pg (ref 25.1–34.0)
MCHC: 32.2 g/dL (ref 31.5–36.0)
MCV: 86.7 fL (ref 79.5–101.0)
MONO#: 0.5 10*3/uL (ref 0.1–0.9)
MONO%: 13.9 % (ref 0.0–14.0)
NEUT#: 2 10*3/uL (ref 1.5–6.5)
NEUT%: 51.4 % (ref 38.4–76.8)
PLATELETS: 160 10*3/uL (ref 145–400)
RBC: 4.81 10*6/uL (ref 3.70–5.45)
RDW: 16.3 % — ABNORMAL HIGH (ref 11.2–14.5)
WBC: 3.9 10*3/uL (ref 3.9–10.3)
lymph#: 1.2 10*3/uL (ref 0.9–3.3)

## 2014-09-08 LAB — COMPREHENSIVE METABOLIC PANEL (CC13)
ALBUMIN: 3.5 g/dL (ref 3.5–5.0)
ALT: 27 U/L (ref 0–55)
AST: 35 U/L — ABNORMAL HIGH (ref 5–34)
Alkaline Phosphatase: 135 U/L (ref 40–150)
Anion Gap: 12 mEq/L — ABNORMAL HIGH (ref 3–11)
BILIRUBIN TOTAL: 0.35 mg/dL (ref 0.20–1.20)
BUN: 16.1 mg/dL (ref 7.0–26.0)
CO2: 32 mEq/L — ABNORMAL HIGH (ref 22–29)
CREATININE: 1.6 mg/dL — AB (ref 0.6–1.1)
Calcium: 9.4 mg/dL (ref 8.4–10.4)
Chloride: 103 mEq/L (ref 98–109)
EGFR: 37 mL/min/{1.73_m2} — ABNORMAL LOW (ref >90)
POTASSIUM: 3.6 meq/L (ref 3.5–5.1)
SODIUM: 148 meq/L — AB (ref 136–145)
Total Protein: 7.3 g/dL (ref 6.4–8.3)

## 2014-09-08 MED ORDER — HYDROMORPHONE HCL 4 MG PO TABS: 4.0000 mg | ORAL_TABLET | Freq: Three times a day (TID) | ORAL | Status: DC | PRN

## 2014-09-08 NOTE — Assessment & Plan Note (Signed)
I spent some time counseling the patient the importance of tobacco cessation. she is currently attempting to quit on her own  I gave her patient education handout and encouraged her to sign up for smoking cessation class.  

## 2014-09-08 NOTE — Telephone Encounter (Signed)
Per staff message and POF I have scheduled appts. Advised scheduler of appts. JMW  

## 2014-09-08 NOTE — Assessment & Plan Note (Signed)
She tolerated treatment well and has no evidence of new liver metastasis. Liver function tests are normal.

## 2014-09-08 NOTE — Progress Notes (Signed)
[facility] OFFICE PROGRESS NOTE  Patient Care Team: Elberta Leatherwood, MD as PCP - General Windy Kalata, MD as Consulting Physician (Nephrology) Leatrice Jewels Rayetta Pigg, MD as Consulting Physician (Psychiatry) Heath Lark, MD as Consulting Physician (Hematology and Oncology)  SUMMARY OF ONCOLOGIC HISTORY: Oncology History   Colon cancer, KRAS positive   Primary site: Colon and Rectum (Right)   Staging method: AJCC 7th Edition   Clinical: (T4b, N2b, M1) signed by Heath Lark, MD on 08/05/2013  9:52 AM   Pathologic: Stage IVB (T4b, N2b, M1b) signed by Heath Lark, MD on 08/05/2013 10:21 AM   Summary: Stage IVB (T4b, N2b, M1b)      Colon cancer   07/15/2013 - 07/25/2013 Hospital Admission The patient presented to the hospital with pain in her abdomen and underwent surgery for colon cancer   07/15/2013 Imaging CT scan of the abdomen show bowel dilatation suspicious for malignancy   07/17/2013 Tumor Marker Preoperative CEA was 2.2, normal   07/19/2013 Procedure Colonoscopy revealed ascending colon lesion with significant stricture and biopsy showed adenocarcinoma   07/20/2013 Surgery She underwent right hemicolectomy, cholecystectomy, liver biopsy, excision of abdominal wall mass and omental mass. During surgery, she was also found to have drop metastasis   08/29/2013 Imaging Repeat CT scan show persistent liver metastasis and new omental metastasis   08/31/2013 - 02/10/2014 Chemotherapy The patient was started on palliative chemotherapy with FOLFOX, bolus 5-FU committed and oxaliplatin dose reduced due to  peripheral neuropathy. She has completed 12 cycles of treatment.   11/22/2013 Imaging Repeat CT scan showed near complete response to treatment.   11/23/2013 - 02/10/2014 Chemotherapy Avastin was added to her chemotherapy regimen   12/21/2013 Adverse Reaction Oxaliplatin was omitted due to worsening peripheral neuropathy.   02/20/2014 Imaging Repeat CT scan showed near complete  resolution of all disease.   05/15/2014 Imaging Repeat CT scan showed peritoneal metastasis and recurrence of disease   05/22/2014 -  Chemotherapy Modify dosings of FOLFOX was restarted.   07/14/2014 Imaging CT scan of the abdomen and pelvis show positive response to treatment.   09/07/2014 Imaging Repeat CT scan of the chest, abdomen and pelvis shows stable disease.   09/08/2014 Adverse Reaction She will continue treatment without oxaliplatin due to severe peripheral neuropathy.    INTERVAL HISTORY: Please see below for problem oriented charting. She is in today to review test result. She continues to have chronic pain in her abdomen. She has chronic constipation, resolved with laxatives. Denies any mucositis nausea or vomiting.  REVIEW OF SYSTEMS:   Constitutional: Denies fevers, chills or abnormal weight loss Eyes: Denies blurriness of vision Ears, nose, mouth, throat, and face: Denies mucositis or sore throat Respiratory: Denies cough, dyspnea or wheezes Cardiovascular: Denies palpitation, chest discomfort or lower extremity swelling Skin: Denies abnormal skin rashes Lymphatics: Denies new lymphadenopathy or easy bruising Neurological:Denies numbness, tingling or new weaknesses Behavioral/Psych: Mood is stable, no new changes  All other systems were reviewed with the patient and are negative.  I have reviewed the past medical history, past surgical history, social history and family history with the patient and they are unchanged from previous note.  ALLERGIES:  is allergic to naproxen.  MEDICATIONS:  Current Outpatient Prescriptions  Medication Sig Dispense Refill  . atorvastatin (LIPITOR) 40 MG tablet Take 1 tablet (40 mg total) by mouth daily. 30 tablet 5  . Buprenorphine HCl-Naloxone HCl (SUBOXONE SL) Place 8 mg under the tongue 3 (three) times daily.    . clonazePAM (  KLONOPIN) 1 MG tablet Take 1 mg by mouth 3 (three) times daily.     . diazepam (VALIUM) 10 MG tablet Take 10 mg  by mouth every 8 (eight) hours as needed for anxiety.    . furosemide (LASIX) 40 MG tablet Take 1 tablet (40 mg total) by mouth 2 (two) times daily. 60 tablet 5  . gabapentin (NEURONTIN) 800 MG tablet Take 800 mg by mouth 3 (three) times daily.    Marland Kitchen glucose blood (ACCU-CHEK AVIVA PLUS) test strip USE AS DIRECTED 100 each 10  . HYDROXYZINE PAMOATE PO Take 25 mg by mouth 3 (three) times daily.    . insulin glargine (LANTUS) 100 UNIT/ML injection Inject 0.27 mLs (27 Units total) into the skin daily. 10 mL 3  . insulin lispro (HUMALOG) 100 UNIT/ML injection Inject 1-10 Units into the skin 3 (three) times daily before meals.    . lamoTRIgine (LAMICTAL) 100 MG tablet Take 200 mg by mouth every morning.     . lidocaine-prilocaine (EMLA) cream Apply 1 application topically as needed (for pain).    Marland Kitchen omeprazole (PRILOSEC) 20 MG capsule Take 20 mg by mouth every morning.    . polyethylene glycol powder (GLYCOLAX/MIRALAX) powder Take 17 g by mouth 2 (two) times daily as needed. 3350 g 3  . ROZEREM 8 MG tablet Take 8 mg by mouth at bedtime.  0  . senna-docusate (SENOKOT-S) 8.6-50 MG per tablet Take 1 tablet by mouth 2 (two) times daily. 90 tablet 3  . ziprasidone (GEODON) 80 MG capsule Take 160 mg by mouth at bedtime.     Marland Kitchen HYDROmorphone (DILAUDID) 4 MG tablet Take 1 tablet (4 mg total) by mouth 3 (three) times daily as needed for severe pain. 90 tablet 0   No current facility-administered medications for this visit.    PHYSICAL EXAMINATION: ECOG PERFORMANCE STATUS: 1 - Symptomatic but completely ambulatory  Filed Vitals:   09/08/14 1328  BP: 124/66  Pulse: 58  Temp: 97.8 F (36.6 C)  Resp: 18   Filed Weights   09/08/14 1328  Weight: 167 lb 9.6 oz (76.023 kg)    GENERAL:alert, no distress and comfortable SKIN: skin color, texture, turgor are normal, no rashes or significant lesions EYES: normal, Conjunctiva are pink and non-injected, sclera clear OROPHARYNX:no exudate, no erythema and lips,  buccal mucosa, and tongue normal  NECK: supple, thyroid normal size, non-tender, without nodularity LYMPH:  no palpable lymphadenopathy in the cervical, axillary or inguinal LUNGS: clear to auscultation and percussion with normal breathing effort HEART: regular rate & rhythm and no murmurs and no lower extremity edema ABDOMEN:abdomen soft, non-tender and normal bowel sounds Musculoskeletal:no cyanosis of digits and no clubbing  NEURO: alert & oriented x 3 with fluent speech, no focal motor/sensory deficits  LABORATORY DATA:  I have reviewed the data as listed    Component Value Date/Time   NA 143 08/28/2014 1033   NA 142 05/14/2014 2233   K 4.0 08/28/2014 1033   K 3.8 05/14/2014 2233   CL 99 05/14/2014 2233   CO2 26 08/28/2014 1033   CO2 29 05/14/2014 2233   GLUCOSE 199* 08/28/2014 1033   GLUCOSE 88 05/14/2014 2233   BUN 23.4 08/28/2014 1033   BUN 8 05/14/2014 2233   CREATININE 1.2* 08/28/2014 1033   CREATININE 0.98 05/14/2014 2233   CREATININE 1.38* 05/26/2013 1703   CALCIUM 9.4 08/28/2014 1033   CALCIUM 9.1 05/14/2014 2233   PROT 7.4 08/28/2014 1033   PROT 7.1 05/14/2014 2233  ALBUMIN 3.6 08/28/2014 1033   ALBUMIN 3.1* 05/14/2014 2233   AST 26 08/28/2014 1033   AST 28 05/14/2014 2233   ALT 24 08/28/2014 1033   ALT 18 05/14/2014 2233   ALKPHOS 117 08/28/2014 1033   ALKPHOS 124* 05/14/2014 2233   BILITOT 0.36 08/28/2014 1033   BILITOT 0.2* 05/14/2014 2233   GFRNONAA 65* 05/14/2014 2233   GFRNONAA 44* 05/26/2013 1703   GFRAA 76* 05/14/2014 2233   GFRAA 51* 05/26/2013 1703    No results found for: SPEP, UPEP  Lab Results  Component Value Date   WBC 3.9 09/08/2014   NEUTROABS 2.0 09/08/2014   HGB 13.5 09/08/2014   HCT 41.7 09/08/2014   MCV 86.7 09/08/2014   PLT 160 09/08/2014      Chemistry      Component Value Date/Time   NA 143 08/28/2014 1033   NA 142 05/14/2014 2233   K 4.0 08/28/2014 1033   K 3.8 05/14/2014 2233   CL 99 05/14/2014 2233   CO2 26  08/28/2014 1033   CO2 29 05/14/2014 2233   BUN 23.4 08/28/2014 1033   BUN 8 05/14/2014 2233   CREATININE 1.2* 08/28/2014 1033   CREATININE 0.98 05/14/2014 2233   CREATININE 1.38* 05/26/2013 1703      Component Value Date/Time   CALCIUM 9.4 08/28/2014 1033   CALCIUM 9.1 05/14/2014 2233   ALKPHOS 117 08/28/2014 1033   ALKPHOS 124* 05/14/2014 2233   AST 26 08/28/2014 1033   AST 28 05/14/2014 2233   ALT 24 08/28/2014 1033   ALT 18 05/14/2014 2233   BILITOT 0.36 08/28/2014 1033   BILITOT 0.2* 05/14/2014 2233       RADIOGRAPHIC STUDIES: I have personally reviewed the radiological images as listed and agreed with the findings in the report. Ct Chest W Contrast  09/06/2014   CLINICAL DATA:  Colon cancer originally diagnosed in 2014. Hepatic metastatic disease. Ongoing chemotherapy.  EXAM: CT CHEST, ABDOMEN, AND PELVIS WITH CONTRAST  TECHNIQUE: Multidetector CT imaging of the chest, abdomen and pelvis was performed following the standard protocol during bolus administration of intravenous contrast.  CONTRAST:  164m OMNIPAQUE IOHEXOL 300 MG/ML  SOLN  COMPARISON:  Multiple exams, including 07/14/2014 and 02/20/2014  FINDINGS: CT CHEST FINDINGS  Mediastinum/Nodes: AP window lymph node 9 mm in short axis, previously not measurable. Left supraclavicular node 1.4 cm in short axis on image 5 series 2, previously about 0.6 cm. Bilateral axillary lymph nodes are not pathologically enlarged and appear stable.  Borderline cardiomegaly.  Lungs/Pleura: There are new scattered bilateral pulmonary nodules in both lungs, random distribution. One of the larger of these is an index right lower lobe pulmonary nodule measuring 1.0 by 0.9 cm on image 32 series 4. These nodules are scattered throughout all segments of the lungs.  Musculoskeletal: Unremarkable  CT ABDOMEN PELVIS FINDINGS  Hepatobiliary: Hepatic metastatic disease it is fairly indistinct. A right hepatic lobe lesion on image 46 of series 2 appears to have  central calcification measures 1.5 by 1.9 cm, formerly measured at 1.6 by 1.9 cm, so essentially stable. Stable minimal intrahepatic biliary dilatation. Cholecystectomy.  Pancreas: Unremarkable.  Spleen: Unremarkable.  Adrenals/Urinary Tract: Unremarkable  Stomach/Bowel: Prominent stool throughout the colon favors constipation. Rectal wall thickening is probably due to nondistention rather than tumor.  Vascular/Lymphatic: Extensive retroperitoneal adenopathy encasing the abdominal aorta. An index left periaortic node below the left renal vein measures 2.4 cm in short axis, previously 2.4 cm when measured in the same fashion. An index  aortocaval node about at the level of the bifurcation measures 1.9 cm in short axis, previously 1.8 cm.  Reproductive: Large irregular mass along the dorsum of the uterus, possibly representing ovarian tumor or deposited malignancy, with a cystic and solid 7.3 by 6.5 cm left adnexal component (formerly 6.0 by 5.4 cm) and a 4.5 cm right adnexal mass which appear stable. These lesions are inseparable from the ovaries.  Other: Omental nodularity bilaterally compatible with tumor deposits. An index left-sided deposit measures 1.2 by 2.0 cm, formerly 1.3 by 2.3 cm.  Musculoskeletal: Unremarkable  IMPRESSION: 1. Numerous new and enlarged scattered bilateral pulmonary nodules, random distribution, indicating worsening pulmonary metastatic disease. 2. Essentially stable intra-abdominal malignancy, with stable hepatic metastatic lesions, stable omental deposits of tumor, stable tumor along both adnexa, and stable extensive retroperitoneal adenopathy.   Electronically Signed   By: Sherryl Barters M.D.   On: 09/06/2014 16:55   Ct Abdomen Pelvis W Contrast  09/06/2014   CLINICAL DATA:  Colon cancer originally diagnosed in 2014. Hepatic metastatic disease. Ongoing chemotherapy.  EXAM: CT CHEST, ABDOMEN, AND PELVIS WITH CONTRAST  TECHNIQUE: Multidetector CT imaging of the chest, abdomen and pelvis  was performed following the standard protocol during bolus administration of intravenous contrast.  CONTRAST:  183m OMNIPAQUE IOHEXOL 300 MG/ML  SOLN  COMPARISON:  Multiple exams, including 07/14/2014 and 02/20/2014  FINDINGS: CT CHEST FINDINGS  Mediastinum/Nodes: AP window lymph node 9 mm in short axis, previously not measurable. Left supraclavicular node 1.4 cm in short axis on image 5 series 2, previously about 0.6 cm. Bilateral axillary lymph nodes are not pathologically enlarged and appear stable.  Borderline cardiomegaly.  Lungs/Pleura: There are new scattered bilateral pulmonary nodules in both lungs, random distribution. One of the larger of these is an index right lower lobe pulmonary nodule measuring 1.0 by 0.9 cm on image 32 series 4. These nodules are scattered throughout all segments of the lungs.  Musculoskeletal: Unremarkable  CT ABDOMEN PELVIS FINDINGS  Hepatobiliary: Hepatic metastatic disease it is fairly indistinct. A right hepatic lobe lesion on image 46 of series 2 appears to have central calcification measures 1.5 by 1.9 cm, formerly measured at 1.6 by 1.9 cm, so essentially stable. Stable minimal intrahepatic biliary dilatation. Cholecystectomy.  Pancreas: Unremarkable.  Spleen: Unremarkable.  Adrenals/Urinary Tract: Unremarkable  Stomach/Bowel: Prominent stool throughout the colon favors constipation. Rectal wall thickening is probably due to nondistention rather than tumor.  Vascular/Lymphatic: Extensive retroperitoneal adenopathy encasing the abdominal aorta. An index left periaortic node below the left renal vein measures 2.4 cm in short axis, previously 2.4 cm when measured in the same fashion. An index aortocaval node about at the level of the bifurcation measures 1.9 cm in short axis, previously 1.8 cm.  Reproductive: Large irregular mass along the dorsum of the uterus, possibly representing ovarian tumor or deposited malignancy, with a cystic and solid 7.3 by 6.5 cm left adnexal  component (formerly 6.0 by 5.4 cm) and a 4.5 cm right adnexal mass which appear stable. These lesions are inseparable from the ovaries.  Other: Omental nodularity bilaterally compatible with tumor deposits. An index left-sided deposit measures 1.2 by 2.0 cm, formerly 1.3 by 2.3 cm.  Musculoskeletal: Unremarkable  IMPRESSION: 1. Numerous new and enlarged scattered bilateral pulmonary nodules, random distribution, indicating worsening pulmonary metastatic disease. 2. Essentially stable intra-abdominal malignancy, with stable hepatic metastatic lesions, stable omental deposits of tumor, stable tumor along both adnexa, and stable extensive retroperitoneal adenopathy.   Electronically Signed   By: WSherryl Barters  M.D.   On: 09/06/2014 16:55     ASSESSMENT & PLAN:  Colon cancer Recent imaging study show positive response to treatment. CT scan of the chest was compared with prior scan which is over 46 months old which show evidence of lung nodules. This does not surprise me. I do not feel that the lung nodules are metastatic disease that grew on recent treatment, rather it probably grew when she was getting treatment break. The size of the lymph node and endometrial mass has regressed in size. Overall, she has positive response to treatment. However, due to worsening severe peripheral neuropathy, I plan to drop oxaliplatin but continue on 5-FU and Avastin. This is discussed with another oncologist and he concurred with the plan of care. I will continue same treatment for 2-3 months before repeating another imaging study.    Metastasis to liver She tolerated treatment well and has no evidence of new liver metastasis. Liver function tests are normal.   CKD (chronic kidney disease), stage III Her kidney function tests is unaffected by recent chemotherapy. Continue close observation.   Other constipation Clinically, she is responding to treatment. However, she is dependent on magnesium citrate and have  struggle with constipation lately. Continue to same.   TOBACCO ABUSE I spent some time counseling the patient the importance of tobacco cessation. she is currently attempting to quit on her own  I gave her patient education handout and encouraged her to sign up for smoking cessation class.    Chronic pain disorder She has chronic pain disorder and cancer related pain. She was on a pain management clinic in the past but she said this is not working well. She wants me to manage her pain from now on. I gave her prescription Dilaudid to take as needed. I reinforced narcotic refill policy with the patient.    No orders of the defined types were placed in this encounter.   All questions were answered. The patient knows to call the clinic with any problems, questions or concerns. No barriers to learning was detected. I spent 40 minutes counseling the patient face to face. The total time spent in the appointment was 55 minutes and more than 50% was on counseling and review of test results     [facility], [place], MD 09/08/2014 2:04 PM

## 2014-09-08 NOTE — Assessment & Plan Note (Signed)
Her kidney function tests is unaffected by recent chemotherapy. Continue close observation.

## 2014-09-08 NOTE — Assessment & Plan Note (Signed)
Recent imaging study show positive response to treatment. CT scan of the chest was compared with prior scan which is over 37 months old which show evidence of lung nodules. This does not surprise me. I do not feel that the lung nodules are metastatic disease that grew on recent treatment, rather it probably grew when she was getting treatment break. The size of the lymph node and endometrial mass has regressed in size. Overall, she has positive response to treatment. However, due to worsening severe peripheral neuropathy, I plan to drop oxaliplatin but continue on 5-FU and Avastin. This is discussed with another oncologist and he concurred with the plan of care. I will continue same treatment for 2-3 months before repeating another imaging study.

## 2014-09-08 NOTE — Telephone Encounter (Signed)
Called patient to let her know glucose was 15. Pt states she just ate a pear and a banana- checked BS while we were on the phone- has come up to 59. Pt to drink OJ and call PCP to let him know. Mother is with patient. Pt states she was 14 a week or 2 ago, called EMS. Reminded pt to keep close check on BS

## 2014-09-08 NOTE — Assessment & Plan Note (Signed)
She has chronic pain disorder and cancer related pain. She was on a pain management clinic in the past but she said this is not working well. She wants me to manage her pain from now on. I gave her prescription Dilaudid to take as needed. I reinforced narcotic refill policy with the patient.

## 2014-09-08 NOTE — Assessment & Plan Note (Signed)
Clinically, she is responding to treatment. However, she is dependent on magnesium citrate and have struggle with constipation lately. Continue to same.

## 2014-09-08 NOTE — Telephone Encounter (Signed)
Pt confirmed labs/ov per 02/05 POF, gave pt AVS.... KJ, sent msg to add chemo °

## 2014-09-11 ENCOUNTER — Ambulatory Visit: Payer: Medicaid Other

## 2014-09-11 ENCOUNTER — Ambulatory Visit (HOSPITAL_BASED_OUTPATIENT_CLINIC_OR_DEPARTMENT_OTHER): Payer: Medicaid Other

## 2014-09-11 DIAGNOSIS — C786 Secondary malignant neoplasm of retroperitoneum and peritoneum: Secondary | ICD-10-CM

## 2014-09-11 DIAGNOSIS — C182 Malignant neoplasm of ascending colon: Secondary | ICD-10-CM

## 2014-09-11 DIAGNOSIS — Z5112 Encounter for antineoplastic immunotherapy: Secondary | ICD-10-CM

## 2014-09-11 DIAGNOSIS — C189 Malignant neoplasm of colon, unspecified: Secondary | ICD-10-CM

## 2014-09-11 DIAGNOSIS — C787 Secondary malignant neoplasm of liver and intrahepatic bile duct: Secondary | ICD-10-CM

## 2014-09-11 DIAGNOSIS — N183 Chronic kidney disease, stage 3 (moderate): Secondary | ICD-10-CM

## 2014-09-11 DIAGNOSIS — Z5111 Encounter for antineoplastic chemotherapy: Secondary | ICD-10-CM

## 2014-09-11 LAB — UA PROTEIN, DIPSTICK - CHCC: Protein, ur: NEGATIVE mg/dL

## 2014-09-11 MED ORDER — SODIUM CHLORIDE 0.9 % IV SOLN
2400.0000 mg/m2 | INTRAVENOUS | Status: DC
  Administered 2014-09-11: 4150 mg via INTRAVENOUS
  Filled 2014-09-11: qty 83

## 2014-09-11 MED ORDER — LEUCOVORIN CALCIUM INJECTION 350 MG
400.0000 mg/m2 | Freq: Once | INTRAMUSCULAR | Status: AC
  Administered 2014-09-11: 692 mg via INTRAVENOUS
  Filled 2014-09-11: qty 34.6

## 2014-09-11 MED ORDER — SODIUM CHLORIDE 0.9 % IV SOLN
5.0000 mg/kg | Freq: Once | INTRAVENOUS | Status: AC
  Administered 2014-09-11: 375 mg via INTRAVENOUS
  Filled 2014-09-11: qty 15

## 2014-09-11 MED ORDER — ONDANSETRON 8 MG/NS 50 ML IVPB
INTRAVENOUS | Status: AC
  Filled 2014-09-11: qty 8

## 2014-09-11 MED ORDER — SODIUM CHLORIDE 0.9 % IV SOLN
Freq: Once | INTRAVENOUS | Status: AC
  Administered 2014-09-11: 09:00:00 via INTRAVENOUS

## 2014-09-11 MED ORDER — ONDANSETRON 8 MG/50ML IVPB (CHCC)
8.0000 mg | Freq: Once | INTRAVENOUS | Status: AC
  Administered 2014-09-11: 8 mg via INTRAVENOUS

## 2014-09-11 NOTE — Progress Notes (Signed)
Urine specimen obtained for protein prior to Avastin.  Urine protein negative. OK to procedd with Avastin.  Pharmacy aware.

## 2014-09-11 NOTE — Patient Instructions (Signed)
[facility] Discharge Instructions for Patients Receiving Chemotherapy  Today you received the following chemotherapy agents: Avastin, Leucovorin and 5FU  To help prevent nausea and vomiting after your treatment, we encourage you to take your nausea medication: As directed.   If you develop nausea and vomiting that is not controlled by your nausea medication, call the clinic.   BELOW ARE SYMPTOMS THAT SHOULD BE REPORTED IMMEDIATELY:  *FEVER GREATER THAN 100.5 F  *CHILLS WITH OR WITHOUT FEVER  NAUSEA AND VOMITING THAT IS NOT CONTROLLED WITH YOUR NAUSEA MEDICATION  *UNUSUAL SHORTNESS OF BREATH  *UNUSUAL BRUISING OR BLEEDING  TENDERNESS IN MOUTH AND THROAT WITH OR WITHOUT PRESENCE OF ULCERS  *URINARY PROBLEMS  *BOWEL PROBLEMS  UNUSUAL RASH Items with * indicate a potential emergency and should be followed up as soon as possible.  Feel free to call the clinic you have any questions or concerns. The clinic phone number is [phone].

## 2014-09-13 ENCOUNTER — Ambulatory Visit (HOSPITAL_BASED_OUTPATIENT_CLINIC_OR_DEPARTMENT_OTHER): Payer: Medicaid Other

## 2014-09-13 DIAGNOSIS — C786 Secondary malignant neoplasm of retroperitoneum and peritoneum: Secondary | ICD-10-CM

## 2014-09-13 DIAGNOSIS — C189 Malignant neoplasm of colon, unspecified: Secondary | ICD-10-CM

## 2014-09-13 DIAGNOSIS — C182 Malignant neoplasm of ascending colon: Secondary | ICD-10-CM

## 2014-09-13 DIAGNOSIS — C787 Secondary malignant neoplasm of liver and intrahepatic bile duct: Secondary | ICD-10-CM

## 2014-09-13 MED ORDER — SODIUM CHLORIDE 0.9 % IJ SOLN
10.0000 mL | INTRAMUSCULAR | Status: DC | PRN
  Administered 2014-09-13: 10 mL
  Filled 2014-09-13: qty 10

## 2014-09-13 MED ORDER — HEPARIN SOD (PORK) LOCK FLUSH 100 UNIT/ML IV SOLN
500.0000 [IU] | Freq: Once | INTRAVENOUS | Status: AC | PRN
  Administered 2014-09-13: 500 [IU]
  Filled 2014-09-13: qty 5

## 2014-09-18 ENCOUNTER — Ambulatory Visit: Payer: Self-pay | Admitting: Family Medicine

## 2014-09-25 ENCOUNTER — Ambulatory Visit (HOSPITAL_BASED_OUTPATIENT_CLINIC_OR_DEPARTMENT_OTHER): Payer: Medicaid Other

## 2014-09-25 ENCOUNTER — Other Ambulatory Visit (HOSPITAL_BASED_OUTPATIENT_CLINIC_OR_DEPARTMENT_OTHER): Payer: Medicaid Other

## 2014-09-25 ENCOUNTER — Ambulatory Visit: Payer: Medicaid Other

## 2014-09-25 VITALS — BP 122/78 | HR 71 | Temp 97.8°F

## 2014-09-25 DIAGNOSIS — C182 Malignant neoplasm of ascending colon: Secondary | ICD-10-CM

## 2014-09-25 DIAGNOSIS — Z95828 Presence of other vascular implants and grafts: Secondary | ICD-10-CM

## 2014-09-25 DIAGNOSIS — C189 Malignant neoplasm of colon, unspecified: Secondary | ICD-10-CM

## 2014-09-25 DIAGNOSIS — C786 Secondary malignant neoplasm of retroperitoneum and peritoneum: Secondary | ICD-10-CM

## 2014-09-25 DIAGNOSIS — C787 Secondary malignant neoplasm of liver and intrahepatic bile duct: Secondary | ICD-10-CM

## 2014-09-25 DIAGNOSIS — Z5112 Encounter for antineoplastic immunotherapy: Secondary | ICD-10-CM

## 2014-09-25 DIAGNOSIS — Z5111 Encounter for antineoplastic chemotherapy: Secondary | ICD-10-CM

## 2014-09-25 LAB — COMPREHENSIVE METABOLIC PANEL (CC13)
ALBUMIN: 3.5 g/dL (ref 3.5–5.0)
ALK PHOS: 115 U/L (ref 40–150)
ALT: 21 U/L (ref 0–55)
AST: 30 U/L (ref 5–34)
Anion Gap: 9 mEq/L (ref 3–11)
BUN: 16.5 mg/dL (ref 7.0–26.0)
CALCIUM: 9.5 mg/dL (ref 8.4–10.4)
CO2: 32 mEq/L — ABNORMAL HIGH (ref 22–29)
CREATININE: 1.3 mg/dL — AB (ref 0.6–1.1)
Chloride: 100 mEq/L (ref 98–109)
EGFR: 45 mL/min/{1.73_m2} — ABNORMAL LOW (ref >90)
Glucose: 98 mg/dl (ref 70–140)
Potassium: 4 mEq/L (ref 3.5–5.1)
SODIUM: 141 meq/L (ref 136–145)
Total Bilirubin: 0.53 mg/dL (ref 0.20–1.20)
Total Protein: 7.1 g/dL (ref 6.4–8.3)

## 2014-09-25 LAB — CBC WITH DIFFERENTIAL/PLATELET
BASO%: 1.1 % (ref 0.0–2.0)
BASOS ABS: 0.1 10*3/uL (ref 0.0–0.1)
EOS%: 1.6 % (ref 0.0–7.0)
Eosinophils Absolute: 0.1 10*3/uL (ref 0.0–0.5)
HCT: 39.7 % (ref 34.8–46.6)
HEMOGLOBIN: 12.9 g/dL (ref 11.6–15.9)
LYMPH#: 1.4 10*3/uL (ref 0.9–3.3)
LYMPH%: 23.8 % (ref 14.0–49.7)
MCH: 28.6 pg (ref 25.1–34.0)
MCHC: 32.5 g/dL (ref 31.5–36.0)
MCV: 88.2 fL (ref 79.5–101.0)
MONO#: 0.7 10*3/uL (ref 0.1–0.9)
MONO%: 12 % (ref 0.0–14.0)
NEUT%: 61.5 % (ref 38.4–76.8)
NEUTROS ABS: 3.6 10*3/uL (ref 1.5–6.5)
Platelets: 137 10*3/uL — ABNORMAL LOW (ref 145–400)
RBC: 4.5 10*6/uL (ref 3.70–5.45)
RDW: 15.9 % — ABNORMAL HIGH (ref 11.2–14.5)
WBC: 5.8 10*3/uL (ref 3.9–10.3)

## 2014-09-25 MED ORDER — LEUCOVORIN CALCIUM INJECTION 350 MG
400.0000 mg/m2 | Freq: Once | INTRAVENOUS | Status: AC
  Administered 2014-09-25: 692 mg via INTRAVENOUS
  Filled 2014-09-25: qty 34.6

## 2014-09-25 MED ORDER — SODIUM CHLORIDE 0.9 % IJ SOLN
10.0000 mL | INTRAMUSCULAR | Status: AC | PRN
  Administered 2014-09-25: 10 mL via INTRAVENOUS
  Filled 2014-09-25: qty 10

## 2014-09-25 MED ORDER — SODIUM CHLORIDE 0.9 % IV SOLN
2400.0000 mg/m2 | INTRAVENOUS | Status: DC
  Administered 2014-09-25: 4150 mg via INTRAVENOUS
  Filled 2014-09-25: qty 83

## 2014-09-25 MED ORDER — BEVACIZUMAB CHEMO INJECTION 400 MG/16ML
5.0000 mg/kg | Freq: Once | INTRAVENOUS | Status: AC
  Administered 2014-09-25: 375 mg via INTRAVENOUS
  Filled 2014-09-25: qty 15

## 2014-09-25 MED ORDER — SODIUM CHLORIDE 0.9 % IV SOLN
Freq: Once | INTRAVENOUS | Status: AC
  Administered 2014-09-25: 10:00:00 via INTRAVENOUS

## 2014-09-25 MED ORDER — ONDANSETRON 8 MG/NS 50 ML IVPB
INTRAVENOUS | Status: AC
  Filled 2014-09-25: qty 8

## 2014-09-25 MED ORDER — DEXTROSE 5 % IV SOLN: Freq: Once | INTRAVENOUS | Status: DC

## 2014-09-25 MED ORDER — ONDANSETRON 8 MG/50ML IVPB (CHCC)
8.0000 mg | Freq: Once | INTRAVENOUS | Status: AC
  Administered 2014-09-25: 8 mg via INTRAVENOUS

## 2014-09-25 MED ORDER — ALTEPLASE 2 MG IJ SOLR
2.0000 mg | Freq: Once | INTRAMUSCULAR | Status: DC | PRN
  Filled 2014-09-25: qty 2

## 2014-09-25 NOTE — Patient Instructions (Signed)
[facility] Discharge Instructions for Patients Receiving Chemotherapy  Today you received the following chemotherapy agents 5-FU/Leucovorin.   To help prevent nausea and vomiting after your treatment, we encourage you to take your nausea medication as directed.    If you develop nausea and vomiting that is not controlled by your nausea medication, call the clinic.   BELOW ARE SYMPTOMS THAT SHOULD BE REPORTED IMMEDIATELY:  *FEVER GREATER THAN 100.5 F  *CHILLS WITH OR WITHOUT FEVER  NAUSEA AND VOMITING THAT IS NOT CONTROLLED WITH YOUR NAUSEA MEDICATION  *UNUSUAL SHORTNESS OF BREATH  *UNUSUAL BRUISING OR BLEEDING  TENDERNESS IN MOUTH AND THROAT WITH OR WITHOUT PRESENCE OF ULCERS  *URINARY PROBLEMS  *BOWEL PROBLEMS  UNUSUAL RASH Items with * indicate a potential emergency and should be followed up as soon as possible.  Feel free to call the clinic you have any questions or concerns. The clinic phone number is [phone].

## 2014-09-25 NOTE — Patient Instructions (Signed)

## 2014-09-26 MED ORDER — FAMOTIDINE IN NACL 20-0.9 MG/50ML-% IV SOLN
INTRAVENOUS | Status: AC
  Filled 2014-09-26: qty 50

## 2014-09-26 MED ORDER — ONDANSETRON 8 MG/NS 50 ML IVPB
INTRAVENOUS | Status: AC
  Filled 2014-09-26: qty 8

## 2014-09-26 MED ORDER — DIPHENHYDRAMINE HCL 50 MG/ML IJ SOLN
INTRAMUSCULAR | Status: AC
  Filled 2014-09-26: qty 1

## 2014-09-26 MED ORDER — DEXAMETHASONE SODIUM PHOSPHATE 10 MG/ML IJ SOLN
INTRAMUSCULAR | Status: AC
  Filled 2014-09-26: qty 1

## 2014-09-27 ENCOUNTER — Ambulatory Visit (HOSPITAL_BASED_OUTPATIENT_CLINIC_OR_DEPARTMENT_OTHER): Payer: Medicaid Other

## 2014-09-27 DIAGNOSIS — C189 Malignant neoplasm of colon, unspecified: Secondary | ICD-10-CM

## 2014-09-27 DIAGNOSIS — C182 Malignant neoplasm of ascending colon: Secondary | ICD-10-CM

## 2014-09-27 DIAGNOSIS — C787 Secondary malignant neoplasm of liver and intrahepatic bile duct: Secondary | ICD-10-CM

## 2014-09-27 DIAGNOSIS — C786 Secondary malignant neoplasm of retroperitoneum and peritoneum: Secondary | ICD-10-CM

## 2014-09-27 MED ORDER — SODIUM CHLORIDE 0.9 % IJ SOLN
10.0000 mL | INTRAMUSCULAR | Status: DC | PRN
  Filled 2014-09-27: qty 10

## 2014-09-27 MED ORDER — HEPARIN SOD (PORK) LOCK FLUSH 100 UNIT/ML IV SOLN
500.0000 [IU] | Freq: Once | INTRAVENOUS | Status: DC | PRN
  Filled 2014-09-27: qty 5

## 2014-10-09 ENCOUNTER — Other Ambulatory Visit (HOSPITAL_BASED_OUTPATIENT_CLINIC_OR_DEPARTMENT_OTHER): Payer: Medicaid Other

## 2014-10-09 ENCOUNTER — Ambulatory Visit (HOSPITAL_BASED_OUTPATIENT_CLINIC_OR_DEPARTMENT_OTHER): Payer: Medicaid Other | Admitting: Hematology and Oncology

## 2014-10-09 ENCOUNTER — Ambulatory Visit: Payer: Medicaid Other

## 2014-10-09 ENCOUNTER — Ambulatory Visit (HOSPITAL_BASED_OUTPATIENT_CLINIC_OR_DEPARTMENT_OTHER): Payer: Medicaid Other

## 2014-10-09 ENCOUNTER — Encounter: Payer: Self-pay | Admitting: Hematology and Oncology

## 2014-10-09 ENCOUNTER — Telehealth: Payer: Self-pay | Admitting: Hematology and Oncology

## 2014-10-09 VITALS — BP 123/86 | HR 87 | Temp 98.2°F | Resp 18 | Ht 66.0 in | Wt 160.4 lb

## 2014-10-09 DIAGNOSIS — G894 Chronic pain syndrome: Secondary | ICD-10-CM

## 2014-10-09 DIAGNOSIS — C786 Secondary malignant neoplasm of retroperitoneum and peritoneum: Secondary | ICD-10-CM

## 2014-10-09 DIAGNOSIS — C787 Secondary malignant neoplasm of liver and intrahepatic bile duct: Secondary | ICD-10-CM

## 2014-10-09 DIAGNOSIS — G893 Neoplasm related pain (acute) (chronic): Secondary | ICD-10-CM

## 2014-10-09 DIAGNOSIS — Z95828 Presence of other vascular implants and grafts: Secondary | ICD-10-CM

## 2014-10-09 DIAGNOSIS — N183 Chronic kidney disease, stage 3 unspecified: Secondary | ICD-10-CM

## 2014-10-09 DIAGNOSIS — Z452 Encounter for adjustment and management of vascular access device: Secondary | ICD-10-CM

## 2014-10-09 DIAGNOSIS — Z5112 Encounter for antineoplastic immunotherapy: Secondary | ICD-10-CM

## 2014-10-09 DIAGNOSIS — C182 Malignant neoplasm of ascending colon: Secondary | ICD-10-CM

## 2014-10-09 DIAGNOSIS — Z5111 Encounter for antineoplastic chemotherapy: Secondary | ICD-10-CM

## 2014-10-09 DIAGNOSIS — R911 Solitary pulmonary nodule: Secondary | ICD-10-CM

## 2014-10-09 DIAGNOSIS — E1142 Type 2 diabetes mellitus with diabetic polyneuropathy: Secondary | ICD-10-CM

## 2014-10-09 DIAGNOSIS — C189 Malignant neoplasm of colon, unspecified: Secondary | ICD-10-CM

## 2014-10-09 DIAGNOSIS — G8929 Other chronic pain: Secondary | ICD-10-CM

## 2014-10-09 DIAGNOSIS — E1340 Other specified diabetes mellitus with diabetic neuropathy, unspecified: Secondary | ICD-10-CM

## 2014-10-09 LAB — COMPREHENSIVE METABOLIC PANEL (CC13)
ALBUMIN: 3.8 g/dL (ref 3.5–5.0)
ALT: 16 U/L (ref 0–55)
AST: 22 U/L (ref 5–34)
Alkaline Phosphatase: 140 U/L (ref 40–150)
Anion Gap: 15 mEq/L — ABNORMAL HIGH (ref 3–11)
BUN: 18.5 mg/dL (ref 7.0–26.0)
CHLORIDE: 98 meq/L (ref 98–109)
CO2: 28 meq/L (ref 22–29)
Calcium: 9.8 mg/dL (ref 8.4–10.4)
Creatinine: 1.5 mg/dL — ABNORMAL HIGH (ref 0.6–1.1)
EGFR: 40 mL/min/{1.73_m2} — ABNORMAL LOW (ref >90)
Glucose: 101 mg/dl (ref 70–140)
Potassium: 3.7 mEq/L (ref 3.5–5.1)
Sodium: 141 mEq/L (ref 136–145)
Total Bilirubin: 0.64 mg/dL (ref 0.20–1.20)
Total Protein: 8.5 g/dL — ABNORMAL HIGH (ref 6.4–8.3)

## 2014-10-09 LAB — CBC WITH DIFFERENTIAL/PLATELET
BASO%: 0.7 % (ref 0.0–2.0)
Basophils Absolute: 0.1 10*3/uL (ref 0.0–0.1)
EOS ABS: 0.1 10*3/uL (ref 0.0–0.5)
EOS%: 1 % (ref 0.0–7.0)
HEMATOCRIT: 46.8 % — AB (ref 34.8–46.6)
HGB: 15.5 g/dL (ref 11.6–15.9)
LYMPH%: 12.1 % — AB (ref 14.0–49.7)
MCH: 28.8 pg (ref 25.1–34.0)
MCHC: 33.1 g/dL (ref 31.5–36.0)
MCV: 86.9 fL (ref 79.5–101.0)
MONO#: 1.2 10*3/uL — ABNORMAL HIGH (ref 0.1–0.9)
MONO%: 12.2 % (ref 0.0–14.0)
NEUT#: 7 10*3/uL — ABNORMAL HIGH (ref 1.5–6.5)
NEUT%: 74 % (ref 38.4–76.8)
PLATELETS: 167 10*3/uL (ref 145–400)
RBC: 5.39 10*6/uL (ref 3.70–5.45)
RDW: 15.3 % — ABNORMAL HIGH (ref 11.2–14.5)
WBC: 9.4 10*3/uL (ref 3.9–10.3)
lymph#: 1.1 10*3/uL (ref 0.9–3.3)

## 2014-10-09 MED ORDER — SODIUM CHLORIDE 0.9 % IV SOLN
5.0000 mg/kg | Freq: Once | INTRAVENOUS | Status: AC
  Administered 2014-10-09: 375 mg via INTRAVENOUS
  Filled 2014-10-09: qty 15

## 2014-10-09 MED ORDER — ALTEPLASE 2 MG IJ SOLR
2.0000 mg | Freq: Once | INTRAMUSCULAR | Status: AC | PRN
Start: 2014-10-09 — End: 2014-10-09
  Administered 2014-10-09: 2 mg
  Filled 2014-10-09: qty 2

## 2014-10-09 MED ORDER — ONDANSETRON 8 MG/50ML IVPB (CHCC)
8.0000 mg | Freq: Once | INTRAVENOUS | Status: AC
  Administered 2014-10-09: 8 mg via INTRAVENOUS
  Filled 2014-10-09: qty 8

## 2014-10-09 MED ORDER — SODIUM CHLORIDE 0.9 % IV SOLN
2400.0000 mg/m2 | INTRAVENOUS | Status: DC
  Administered 2014-10-09: 4150 mg via INTRAVENOUS
  Filled 2014-10-09: qty 83

## 2014-10-09 MED ORDER — SODIUM CHLORIDE 0.9 % IJ SOLN
10.0000 mL | INTRAMUSCULAR | Status: DC | PRN
  Administered 2014-10-09: 10 mL via INTRAVENOUS
  Filled 2014-10-09: qty 10

## 2014-10-09 MED ORDER — LEUCOVORIN CALCIUM INJECTION 350 MG
400.0000 mg/m2 | Freq: Once | INTRAMUSCULAR | Status: AC
  Administered 2014-10-09: 692 mg via INTRAVENOUS
  Filled 2014-10-09: qty 34.6

## 2014-10-09 MED ORDER — SODIUM CHLORIDE 0.9 % IV SOLN
Freq: Once | INTRAVENOUS | Status: AC
  Administered 2014-10-09: 11:00:00 via INTRAVENOUS

## 2014-10-09 NOTE — Assessment & Plan Note (Signed)
Her kidney function tests is unaffected by recent chemotherapy. Continue close observation.

## 2014-10-09 NOTE — Assessment & Plan Note (Signed)
Recent imaging study show positive response to treatment. CT scan of the chest was compared with prior scan which is over 93 months old which show evidence of lung nodules. This does not surprise me. I do not feel that the lung nodules are metastatic disease that grew on recent treatment, rather it probably grew when she was getting treatment break. The size of the lymph node and endometrial mass has regressed in size. Overall, she has positive response to treatment. However, due to worsening severe peripheral neuropathy, I plan to drop oxaliplatin but continue on 5-FU and Avastin. This is discussed with another oncologist and he concurred with the plan of care. I will continue same treatment for 2-3 months before repeating another imaging study.

## 2014-10-09 NOTE — Patient Instructions (Signed)
[facility] Discharge Instructions for Patients Receiving Chemotherapy  Today you received the following chemotherapy agents: Leucovorin, Adrucil, and Avastin.   To help prevent nausea and vomiting after your treatment, we encourage you to take your nausea medication as directed.    If you develop nausea and vomiting that is not controlled by your nausea medication, call the clinic.   BELOW ARE SYMPTOMS THAT SHOULD BE REPORTED IMMEDIATELY:  *FEVER GREATER THAN 100.5 F  *CHILLS WITH OR WITHOUT FEVER  NAUSEA AND VOMITING THAT IS NOT CONTROLLED WITH YOUR NAUSEA MEDICATION  *UNUSUAL SHORTNESS OF BREATH  *UNUSUAL BRUISING OR BLEEDING  TENDERNESS IN MOUTH AND THROAT WITH OR WITHOUT PRESENCE OF ULCERS  *URINARY PROBLEMS  *BOWEL PROBLEMS  UNUSUAL RASH Items with * indicate a potential emergency and should be followed up as soon as possible.  Feel free to call the clinic you have any questions or concerns. The clinic phone number is [phone].

## 2014-10-09 NOTE — Assessment & Plan Note (Signed)
She has chronic pain disorder and cancer related pain. She was on a pain management clinic in the past but she said this is not working well. She wants me to manage her pain from now on. However, we will prescription Dilaudid, it is making her sleepy and she has falls. The patient is also taking Suboxone. I reinforced narcotic refill policy with the patient. I do not think it makes sense for her to be on 2 different pain medicine and I would not prescribe Dilaudid anymore as she is on Suboxone.

## 2014-10-09 NOTE — Progress Notes (Signed)
Dr. Alvy Bimler reviewed labs, okay to treat today with creatinine of 1.5.

## 2014-10-09 NOTE — Progress Notes (Signed)
Pt accessed via PAC. Pt denies any pain and/or any other complaints regarding her port. States she does usually have problems getting blood return. Pt was accessed with no problems. No blood return was noted at this time. Pt was sent back to lab for peripheral stick. Ann Lions flushed pt PAC with cath flo.

## 2014-10-09 NOTE — Progress Notes (Signed)
[facility] OFFICE PROGRESS NOTE  Patient Care Team: Elberta Leatherwood, MD as PCP - General Fleet Contras, MD as Consulting Physician (Nephrology) Leatrice Jewels Rayetta Pigg, MD as Consulting Physician (Psychiatry) Heath Lark, MD as Consulting Physician (Hematology and Oncology)  SUMMARY OF ONCOLOGIC HISTORY: Oncology History   Colon cancer, KRAS positive   Primary site: Colon and Rectum (Right)   Staging method: AJCC 7th Edition   Clinical: (T4b, N2b, M1) signed by Heath Lark, MD on 08/05/2013  9:52 AM   Pathologic: Stage IVB (T4b, N2b, M1b) signed by Heath Lark, MD on 08/05/2013 10:21 AM   Summary: Stage IVB (T4b, N2b, M1b)      Colon cancer   07/15/2013 - 07/25/2013 Hospital Admission The patient presented to the hospital with pain in her abdomen and underwent surgery for colon cancer   07/15/2013 Imaging CT scan of the abdomen show bowel dilatation suspicious for malignancy   07/17/2013 Tumor Marker Preoperative CEA was 2.2, normal   07/19/2013 Procedure Colonoscopy revealed ascending colon lesion with significant stricture and biopsy showed adenocarcinoma   07/20/2013 Surgery She underwent right hemicolectomy, cholecystectomy, liver biopsy, excision of abdominal wall mass and omental mass. During surgery, she was also found to have drop metastasis   08/29/2013 Imaging Repeat CT scan show persistent liver metastasis and new omental metastasis   08/31/2013 - 02/10/2014 Chemotherapy The patient was started on palliative chemotherapy with FOLFOX, bolus 5-FU committed and oxaliplatin dose reduced due to  peripheral neuropathy. She has completed 12 cycles of treatment.   11/22/2013 Imaging Repeat CT scan showed near complete response to treatment.   11/23/2013 - 02/10/2014 Chemotherapy Avastin was added to her chemotherapy regimen   12/21/2013 Adverse Reaction Oxaliplatin was omitted due to worsening peripheral neuropathy.   02/20/2014 Imaging Repeat CT scan showed near complete resolution  of all disease.   05/15/2014 Imaging Repeat CT scan showed peritoneal metastasis and recurrence of disease   05/22/2014 -  Chemotherapy Modify dosings of FOLFOX was restarted.   07/14/2014 Imaging CT scan of the abdomen and pelvis show positive response to treatment.   09/07/2014 Imaging Repeat CT scan of the chest, abdomen and pelvis shows stable disease.   09/08/2014 Adverse Reaction She will continue treatment without oxaliplatin due to severe peripheral neuropathy.    INTERVAL HISTORY: Please see below for problem oriented charting. She is seen prior to chemotherapy. With her last month's treatment, I gave her Dilaudid. She is excessively fatigued, sleepy and had recent falls. She still taking Suboxone from her primary doctor. Her blood sugar fluctuate widely from 83 to over 300.  REVIEW OF SYSTEMS:   Constitutional: Denies fevers, chills or abnormal weight loss Eyes: Denies blurriness of vision Ears, nose, mouth, throat, and face: Denies mucositis or sore throat Respiratory: Denies cough, dyspnea or wheezes Cardiovascular: Denies palpitation, chest discomfort or lower extremity swelling Gastrointestinal:  Denies nausea, heartburn or change in bowel habits Skin: Denies abnormal skin rashes Lymphatics: Denies new lymphadenopathy or easy bruising Neurological:Denies numbness, tingling or new weaknesses Behavioral/Psych: Mood is stable, no new changes  All other systems were reviewed with the patient and are negative.  I have reviewed the past medical history, past surgical history, social history and family history with the patient and they are unchanged from previous note.  ALLERGIES:  is allergic to naproxen.  MEDICATIONS:  Current Outpatient Prescriptions  Medication Sig Dispense Refill  . atorvastatin (LIPITOR) 40 MG tablet Take 1 tablet (40 mg total) by mouth daily. 30 tablet 5  .  Buprenorphine HCl-Naloxone HCl (SUBOXONE SL) Place 8 mg under the tongue 3 (three) times daily.     . clonazePAM (KLONOPIN) 1 MG tablet Take 1 mg by mouth 3 (three) times daily.     . diazepam (VALIUM) 10 MG tablet Take 10 mg by mouth every 8 (eight) hours as needed for anxiety.    . furosemide (LASIX) 40 MG tablet Take 1 tablet (40 mg total) by mouth 2 (two) times daily. 60 tablet 5  . gabapentin (NEURONTIN) 800 MG tablet Take 800 mg by mouth 3 (three) times daily.    Marland Kitchen glucose blood (ACCU-CHEK AVIVA PLUS) test strip USE AS DIRECTED 100 each 10  . HYDROmorphone (DILAUDID) 4 MG tablet Take 1 tablet (4 mg total) by mouth 3 (three) times daily as needed for severe pain. 90 tablet 0  . HYDROXYZINE PAMOATE PO Take 25 mg by mouth 3 (three) times daily.    . insulin glargine (LANTUS) 100 UNIT/ML injection Inject 0.27 mLs (27 Units total) into the skin daily. 10 mL 3  . insulin lispro (HUMALOG) 100 UNIT/ML injection Inject 1-10 Units into the skin 3 (three) times daily before meals.    . lamoTRIgine (LAMICTAL) 100 MG tablet Take 200 mg by mouth every morning.     . lidocaine-prilocaine (EMLA) cream Apply 1 application topically as needed (for pain).    Marland Kitchen omeprazole (PRILOSEC) 20 MG capsule Take 20 mg by mouth every morning.    . polyethylene glycol powder (GLYCOLAX/MIRALAX) powder Take 17 g by mouth 2 (two) times daily as needed. 3350 g 3  . ROZEREM 8 MG tablet Take 8 mg by mouth at bedtime.  0  . senna-docusate (SENOKOT-S) 8.6-50 MG per tablet Take 1 tablet by mouth 2 (two) times daily. 90 tablet 3  . ziprasidone (GEODON) 80 MG capsule Take 160 mg by mouth at bedtime.      No current facility-administered medications for this visit.   Facility-Administered Medications Ordered in Other Visits  Medication Dose Route Frequency Provider Last Rate Last Dose  . sodium chloride 0.9 % injection 10 mL  10 mL Intravenous PRN Heath Lark, MD   10 mL at 09/25/14 0913  . sodium chloride 0.9 % injection 10 mL  10 mL Intravenous PRN Heath Lark, MD   10 mL at 10/09/14 0932    PHYSICAL EXAMINATION: ECOG  PERFORMANCE STATUS: 1 - Symptomatic but completely ambulatory  Filed Vitals:   10/09/14 1015  BP: 123/86  Pulse: 87  Temp: 98.2 F (36.8 C)  Resp: 18   Filed Weights   10/09/14 1015  Weight: 160 lb 6.4 oz (72.757 kg)    GENERAL:alert, no distress and comfortable SKIN: skin color, texture, turgor are normal, no rashes or significant lesions EYES: normal, Conjunctiva are pink and non-injected, sclera clear OROPHARYNX:no exudate, no erythema and lips, buccal mucosa, and tongue normal  NECK: supple, thyroid normal size, non-tender, without nodularity LYMPH:  no palpable lymphadenopathy in the cervical, axillary or inguinal LUNGS: clear to auscultation and percussion with normal breathing effort HEART: regular rate & rhythm and no murmurs and no lower extremity edema ABDOMEN:abdomen soft, non-tender and normal bowel sounds Musculoskeletal:no cyanosis of digits and no clubbing  NEURO: alert & oriented x 3 with fluent speech, no focal motor/sensory deficits  LABORATORY DATA:  I have reviewed the data as listed    Component Value Date/Time   NA 141 09/25/2014 0843   NA 142 05/14/2014 2233   K 4.0 09/25/2014 0843   K 3.8 05/14/2014  2233   CL 99 05/14/2014 2233   CO2 32* 09/25/2014 0843   CO2 29 05/14/2014 2233   GLUCOSE 98 09/25/2014 0843   GLUCOSE 88 05/14/2014 2233   BUN 16.5 09/25/2014 0843   BUN 8 05/14/2014 2233   CREATININE 1.3* 09/25/2014 0843   CREATININE 0.98 05/14/2014 2233   CREATININE 1.38* 05/26/2013 1703   CALCIUM 9.5 09/25/2014 0843   CALCIUM 9.1 05/14/2014 2233   PROT 7.1 09/25/2014 0843   PROT 7.1 05/14/2014 2233   ALBUMIN 3.5 09/25/2014 0843   ALBUMIN 3.1* 05/14/2014 2233   AST 30 09/25/2014 0843   AST 28 05/14/2014 2233   ALT 21 09/25/2014 0843   ALT 18 05/14/2014 2233   ALKPHOS 115 09/25/2014 0843   ALKPHOS 124* 05/14/2014 2233   BILITOT 0.53 09/25/2014 0843   BILITOT 0.2* 05/14/2014 2233   GFRNONAA 65* 05/14/2014 2233   GFRNONAA 44* 05/26/2013  1703   GFRAA 76* 05/14/2014 2233   GFRAA 51* 05/26/2013 1703    No results found for: SPEP, UPEP  Lab Results  Component Value Date   WBC 9.4 10/09/2014   NEUTROABS 7.0* 10/09/2014   HGB 15.5 10/09/2014   HCT 46.8* 10/09/2014   MCV 86.9 10/09/2014   PLT 167 10/09/2014      Chemistry      Component Value Date/Time   NA 141 09/25/2014 0843   NA 142 05/14/2014 2233   K 4.0 09/25/2014 0843   K 3.8 05/14/2014 2233   CL 99 05/14/2014 2233   CO2 32* 09/25/2014 0843   CO2 29 05/14/2014 2233   BUN 16.5 09/25/2014 0843   BUN 8 05/14/2014 2233   CREATININE 1.3* 09/25/2014 0843   CREATININE 0.98 05/14/2014 2233   CREATININE 1.38* 05/26/2013 1703      Component Value Date/Time   CALCIUM 9.5 09/25/2014 0843   CALCIUM 9.1 05/14/2014 2233   ALKPHOS 115 09/25/2014 0843   ALKPHOS 124* 05/14/2014 2233   AST 30 09/25/2014 0843   AST 28 05/14/2014 2233   ALT 21 09/25/2014 0843   ALT 18 05/14/2014 2233   BILITOT 0.53 09/25/2014 0843   BILITOT 0.2* 05/14/2014 2233     ASSESSMENT & PLAN:  Colon cancer Recent imaging study show positive response to treatment. CT scan of the chest was compared with prior scan which is over 65 months old which show evidence of lung nodules. This does not surprise me. I do not feel that the lung nodules are metastatic disease that grew on recent treatment, rather it probably grew when she was getting treatment break. The size of the lymph node and endometrial mass has regressed in size. Overall, she has positive response to treatment. However, due to worsening severe peripheral neuropathy, I plan to drop oxaliplatin but continue on 5-FU and Avastin. This is discussed with another oncologist and he concurred with the plan of care. I will continue same treatment for 2-3 months before repeating another imaging study.   Chronic pain disorder She has chronic pain disorder and cancer related pain. She was on a pain management clinic in the past but she said this  is not working well. She wants me to manage her pain from now on. However, we will prescription Dilaudid, it is making her sleepy and she has falls. The patient is also taking Suboxone. I reinforced narcotic refill policy with the patient. I do not think it makes sense for her to be on 2 different pain medicine and I would not prescribe Dilaudid anymore  as she is on Suboxone.     CKD (chronic kidney disease), stage III Her kidney function tests is unaffected by recent chemotherapy. Continue close observation.    Neuropathy due to secondary diabetes This is slightly worse while she is on oxaliplatin. I have omitted oxaliplatin. She may have component of neuropathy from diabetes.    Orders Placed This Encounter  Procedures  . CEA    Standing Status: Standing     Number of Occurrences: 9     Standing Expiration Date: 10/09/2015   All questions were answered. The patient knows to call the clinic with any problems, questions or concerns. No barriers to learning was detected. I spent 25 minutes counseling the patient face to face. The total time spent in the appointment was 30 minutes and more than 50% was on counseling and review of test results     [facility], Tj Kitchings, MD 10/09/2014 10:33 AM

## 2014-10-09 NOTE — Telephone Encounter (Signed)
gv and printed appt sched and avs for pt for March and April...sed added tx. °

## 2014-10-09 NOTE — Assessment & Plan Note (Signed)
This is slightly worse while she is on oxaliplatin. I have omitted oxaliplatin. She may have component of neuropathy from diabetes.

## 2014-10-11 ENCOUNTER — Other Ambulatory Visit: Payer: Self-pay

## 2014-10-11 ENCOUNTER — Ambulatory Visit (HOSPITAL_BASED_OUTPATIENT_CLINIC_OR_DEPARTMENT_OTHER): Payer: Medicaid Other

## 2014-10-11 DIAGNOSIS — C786 Secondary malignant neoplasm of retroperitoneum and peritoneum: Secondary | ICD-10-CM

## 2014-10-11 DIAGNOSIS — C189 Malignant neoplasm of colon, unspecified: Secondary | ICD-10-CM

## 2014-10-11 DIAGNOSIS — C182 Malignant neoplasm of ascending colon: Secondary | ICD-10-CM

## 2014-10-11 DIAGNOSIS — C787 Secondary malignant neoplasm of liver and intrahepatic bile duct: Secondary | ICD-10-CM

## 2014-10-11 MED ORDER — HEPARIN SOD (PORK) LOCK FLUSH 100 UNIT/ML IV SOLN
500.0000 [IU] | Freq: Once | INTRAVENOUS | Status: AC | PRN
  Administered 2014-10-11: 500 [IU]
  Filled 2014-10-11: qty 5

## 2014-10-11 MED ORDER — SODIUM CHLORIDE 0.9 % IJ SOLN
10.0000 mL | INTRAMUSCULAR | Status: DC | PRN
  Administered 2014-10-11: 10 mL
  Filled 2014-10-11: qty 10

## 2014-10-11 NOTE — Patient Instructions (Signed)
Fluorouracil, 5FU; Diclofenac topical cream What is this medicine? FLUOROURACIL; DICLOFENAC (flure oh YOOR a sil; dye KLOE fen ak) is a combination of a topical chemotherapy agent and non-steroidal anti-inflammatory drug (NSAID). It is used on the skin to treat skin cancer and skin conditions that could become cancer. This medicine may be used for other purposes; ask your health care provider or pharmacist if you have questions. COMMON BRAND NAME(S): FLUORAC What should I tell my health care provider before I take this medicine? They need to know if you have any of these conditions: -bleeding problems -cigarette smoker -DPD enzyme deficiency -heart disease -high blood pressure -if you frequently drink alcohol containing drinks -kidney disease -liver disease -open or infected skin -stomach problems -swelling or open sores at the treatment site -recent or planned coronary artery bypass graft (CABG) surgery -an unusual or allergic reaction to fluorouracil, diclofenac, aspirin, other NSAIDs, other medicines, foods, dyes, or preservatives -pregnant or trying to get pregnant -breast-feeding How should I use this medicine? This medicine is only for use on the skin. Follow the directions on the prescription label. Wash hands before and after use. Wash affected area and gently pat dry. To apply this medicine use a cotton-tipped applicator, or use gloves if applying with fingertips. If applied with unprotected fingertips, it is very important to wash your hands well after you apply this medicine. Avoid applying to the eyes, nose, or mouth. Apply enough medicine to cover the affected area. You can cover the area with a light gauze dressing, but do not use tight or air-tight dressings. Finish the full course prescribed by your doctor or health care professional, even if you think your condition is better. Do not stop taking except on the advice of your doctor or health care professional. Talk to your  pediatrician regarding the use of this medicine in children. Special care may be needed. Overdosage: If you think you've taken too much of this medicine contact a poison control center or emergency room at once. Overdosage: If you think you have taken too much of this medicine contact a poison control center or emergency room at once. NOTE: This medicine is only for you. Do not share this medicine with others. What if I miss a dose? If you miss a dose, apply it as soon as you can. If it is almost time for your next dose, only use that dose. Do not apply extra doses. Contact your doctor or health care professional if you miss more than one dose. What may interact with this medicine? Interactions are not expected. Do not use any other skin products without telling your doctor or health care professional. This list may not describe all possible interactions. Give your health care provider a list of all the medicines, herbs, non-prescription drugs, or dietary supplements you use. Also tell them if you smoke, drink alcohol, or use illegal drugs. Some items may interact with your medicine. What should I watch for while using this medicine? Visit your doctor or health care professional for checks on your progress. You will need to use this medicine for 2 to 6 weeks. This may be longer depending on the condition being treated. You may not see full healing for another 1 to 2 months after you stop using the medicine. Treated areas of skin can look unsightly during and for several weeks after treatment with this medicine. This medicine can make you more sensitive to the sun. Keep out of the sun. If you cannot avoid being in   the sun, wear protective clothing and use sunscreen. Do not use sun lamps or tanning beds/booths. Where should I keep my What side effects may I notice from receiving this medicine? Side effects that you should report to your doctor or health care professional as soon as possible: -allergic  reactions like skin rash, itching or hives, swelling of the face, lips, or tongue -black or bloody stools, blood in the urine or vomit -blurred vision -chest pain -difficulty breathing or wheezing -redness, blistering, peeling or loosening of the skin, including inside the mouth -severe redness and swelling of normal skin -slurred speech or weakness on one side of the body -trouble passing urine or change in the amount of urine -unexplained weight gain or swelling -unusually weak or tired -yellowing of eyes or skin Side effects that usually do not require medical attention (Report these to your doctor or health care professional if they continue or are bothersome.): -increased sensitivity of the skin to sun and ultraviolet light -pain and burning of the affected area -scaling or swelling of the affected area -skin rash, itching of the affected area -tenderness This list may not describe all possible side effects. Call your doctor for medical advice about side effects. You may report side effects to FDA at 1-800-FDA-1088. Where should I keep my medicine? Keep out of the reach of children. Store at room temperature between 20 and 25 degrees C (68 and 77 degrees F). Throw away any unused medicine after the expiration date. NOTE: This sheet is a summary. It may not cover all possible information. If you have questions about this medicine, talk to your doctor, pharmacist, or health care provider.  2015, [employer]vier/Gold Standard. (2013-11-21 11:09:58)  

## 2014-10-23 ENCOUNTER — Other Ambulatory Visit (HOSPITAL_BASED_OUTPATIENT_CLINIC_OR_DEPARTMENT_OTHER): Payer: Medicaid Other

## 2014-10-23 ENCOUNTER — Ambulatory Visit: Payer: Medicaid Other

## 2014-10-23 ENCOUNTER — Ambulatory Visit (HOSPITAL_BASED_OUTPATIENT_CLINIC_OR_DEPARTMENT_OTHER): Payer: Medicaid Other

## 2014-10-23 VITALS — BP 100/57 | HR 69 | Temp 98.4°F

## 2014-10-23 DIAGNOSIS — C786 Secondary malignant neoplasm of retroperitoneum and peritoneum: Secondary | ICD-10-CM

## 2014-10-23 DIAGNOSIS — Z5112 Encounter for antineoplastic immunotherapy: Secondary | ICD-10-CM

## 2014-10-23 DIAGNOSIS — Z95828 Presence of other vascular implants and grafts: Secondary | ICD-10-CM

## 2014-10-23 DIAGNOSIS — C189 Malignant neoplasm of colon, unspecified: Secondary | ICD-10-CM

## 2014-10-23 DIAGNOSIS — C787 Secondary malignant neoplasm of liver and intrahepatic bile duct: Secondary | ICD-10-CM

## 2014-10-23 DIAGNOSIS — C182 Malignant neoplasm of ascending colon: Secondary | ICD-10-CM

## 2014-10-23 DIAGNOSIS — Z5111 Encounter for antineoplastic chemotherapy: Secondary | ICD-10-CM

## 2014-10-23 LAB — COMPREHENSIVE METABOLIC PANEL (CC13)
ALBUMIN: 3.2 g/dL — AB (ref 3.5–5.0)
ALK PHOS: 126 U/L (ref 40–150)
ALT: 16 U/L (ref 0–55)
ANION GAP: 12 meq/L — AB (ref 3–11)
AST: 21 U/L (ref 5–34)
BILIRUBIN TOTAL: 0.47 mg/dL (ref 0.20–1.20)
BUN: 12.2 mg/dL (ref 7.0–26.0)
CALCIUM: 9.2 mg/dL (ref 8.4–10.4)
CO2: 30 meq/L — AB (ref 22–29)
CREATININE: 1.2 mg/dL — AB (ref 0.6–1.1)
Chloride: 99 mEq/L (ref 98–109)
EGFR: 51 mL/min/{1.73_m2} — ABNORMAL LOW (ref >90)
GLUCOSE: 196 mg/dL — AB (ref 70–140)
Potassium: 4 mEq/L (ref 3.5–5.1)
SODIUM: 141 meq/L (ref 136–145)
TOTAL PROTEIN: 7.2 g/dL (ref 6.4–8.3)

## 2014-10-23 LAB — CBC WITH DIFFERENTIAL/PLATELET
BASO%: 0.6 % (ref 0.0–2.0)
BASOS ABS: 0 10*3/uL (ref 0.0–0.1)
EOS ABS: 0 10*3/uL (ref 0.0–0.5)
EOS%: 0.7 % (ref 0.0–7.0)
HCT: 39.6 % (ref 34.8–46.6)
HGB: 12.7 g/dL (ref 11.6–15.9)
LYMPH%: 13 % — AB (ref 14.0–49.7)
MCH: 28.4 pg (ref 25.1–34.0)
MCHC: 32 g/dL (ref 31.5–36.0)
MCV: 88.6 fL (ref 79.5–101.0)
MONO#: 0.5 10*3/uL (ref 0.1–0.9)
MONO%: 9.1 % (ref 0.0–14.0)
NEUT%: 76.6 % (ref 38.4–76.8)
NEUTROS ABS: 4.1 10*3/uL (ref 1.5–6.5)
PLATELETS: 145 10*3/uL (ref 145–400)
RBC: 4.46 10*6/uL (ref 3.70–5.45)
RDW: 15.1 % — ABNORMAL HIGH (ref 11.2–14.5)
WBC: 5.3 10*3/uL (ref 3.9–10.3)
lymph#: 0.7 10*3/uL — ABNORMAL LOW (ref 0.9–3.3)

## 2014-10-23 LAB — UA PROTEIN, DIPSTICK - CHCC: PROTEIN: NEGATIVE mg/dL

## 2014-10-23 MED ORDER — SODIUM CHLORIDE 0.9 % IV SOLN
Freq: Once | INTRAVENOUS | Status: AC
  Administered 2014-10-23: 11:00:00 via INTRAVENOUS

## 2014-10-23 MED ORDER — HEPARIN SOD (PORK) LOCK FLUSH 100 UNIT/ML IV SOLN
500.0000 [IU] | Freq: Once | INTRAVENOUS | Status: DC | PRN
  Filled 2014-10-23: qty 5

## 2014-10-23 MED ORDER — DEXTROSE 5 % IV SOLN
Freq: Once | INTRAVENOUS | Status: AC
  Administered 2014-10-23: 13:00:00 via INTRAVENOUS

## 2014-10-23 MED ORDER — SODIUM CHLORIDE 0.9 % IJ SOLN
10.0000 mL | INTRAMUSCULAR | Status: DC | PRN
  Filled 2014-10-23: qty 10

## 2014-10-23 MED ORDER — SODIUM CHLORIDE 0.9 % IV SOLN
Freq: Once | INTRAVENOUS | Status: AC
  Administered 2014-10-23: 11:00:00 via INTRAVENOUS
  Filled 2014-10-23: qty 4

## 2014-10-23 MED ORDER — SODIUM CHLORIDE 0.9 % IV SOLN
5.0000 mg/kg | Freq: Once | INTRAVENOUS | Status: AC
  Administered 2014-10-23: 375 mg via INTRAVENOUS
  Filled 2014-10-23: qty 15

## 2014-10-23 MED ORDER — LEUCOVORIN CALCIUM INJECTION 350 MG
400.0000 mg/m2 | Freq: Once | INTRAVENOUS | Status: AC
  Administered 2014-10-23: 692 mg via INTRAVENOUS
  Filled 2014-10-23: qty 34.6

## 2014-10-23 MED ORDER — SODIUM CHLORIDE 0.9 % IV SOLN
2400.0000 mg/m2 | INTRAVENOUS | Status: DC
  Administered 2014-10-23: 4150 mg via INTRAVENOUS
  Filled 2014-10-23: qty 83

## 2014-10-23 MED ORDER — SODIUM CHLORIDE 0.9 % IJ SOLN
10.0000 mL | INTRAMUSCULAR | Status: DC | PRN
  Administered 2014-10-23: 10 mL via INTRAVENOUS
  Filled 2014-10-23: qty 10

## 2014-10-23 NOTE — Patient Instructions (Signed)
[facility] Discharge Instructions for Patients Receiving Chemotherapy  Today you received the following chemotherapy agents: Avastin, Leucovorin, 5FU   To help prevent nausea and vomiting after your treatment, we encourage you to take your nausea medication as prescribed.    If you develop nausea and vomiting that is not controlled by your nausea medication, call the clinic.   BELOW ARE SYMPTOMS THAT SHOULD BE REPORTED IMMEDIATELY:  *FEVER GREATER THAN 100.5 F  *CHILLS WITH OR WITHOUT FEVER  NAUSEA AND VOMITING THAT IS NOT CONTROLLED WITH YOUR NAUSEA MEDICATION  *UNUSUAL SHORTNESS OF BREATH  *UNUSUAL BRUISING OR BLEEDING  TENDERNESS IN MOUTH AND THROAT WITH OR WITHOUT PRESENCE OF ULCERS  *URINARY PROBLEMS  *BOWEL PROBLEMS  UNUSUAL RASH Items with * indicate a potential emergency and should be followed up as soon as possible.  Feel free to call the clinic you have any questions or concerns. The clinic phone number is [phone].  Please show the CHEMO ALERT CARD at check-in to the Emergency Department and triage nurse.   

## 2014-10-23 NOTE — Patient Instructions (Signed)

## 2014-10-24 LAB — CEA: CEA: 11.1 ng/mL — AB (ref 0.0–5.0)

## 2014-10-25 ENCOUNTER — Ambulatory Visit (HOSPITAL_BASED_OUTPATIENT_CLINIC_OR_DEPARTMENT_OTHER): Payer: Medicaid Other

## 2014-10-25 DIAGNOSIS — C182 Malignant neoplasm of ascending colon: Secondary | ICD-10-CM

## 2014-10-25 DIAGNOSIS — Z452 Encounter for adjustment and management of vascular access device: Secondary | ICD-10-CM

## 2014-10-25 DIAGNOSIS — C189 Malignant neoplasm of colon, unspecified: Secondary | ICD-10-CM

## 2014-10-25 DIAGNOSIS — C786 Secondary malignant neoplasm of retroperitoneum and peritoneum: Secondary | ICD-10-CM

## 2014-10-25 DIAGNOSIS — C787 Secondary malignant neoplasm of liver and intrahepatic bile duct: Secondary | ICD-10-CM

## 2014-10-25 MED ORDER — HEPARIN SOD (PORK) LOCK FLUSH 100 UNIT/ML IV SOLN
500.0000 [IU] | Freq: Once | INTRAVENOUS | Status: AC | PRN
  Administered 2014-10-25: 500 [IU]
  Filled 2014-10-25: qty 5

## 2014-10-25 MED ORDER — SODIUM CHLORIDE 0.9 % IJ SOLN
10.0000 mL | INTRAMUSCULAR | Status: DC | PRN
  Administered 2014-10-25: 10 mL
  Filled 2014-10-25: qty 10

## 2014-11-05 ENCOUNTER — Emergency Department (HOSPITAL_COMMUNITY)
Admission: EM | Admit: 2014-11-05 | Discharge: 2014-11-06 | Disposition: A | Discharge status: Home / Self Care | Payer: Medicaid Other | Attending: Emergency Medicine | Admitting: Emergency Medicine

## 2014-11-05 ENCOUNTER — Encounter (HOSPITAL_COMMUNITY): Payer: Self-pay | Admitting: Emergency Medicine

## 2014-11-05 DIAGNOSIS — E11649 Type 2 diabetes mellitus with hypoglycemia without coma: Secondary | ICD-10-CM | POA: Diagnosis not present

## 2014-11-05 DIAGNOSIS — R55 Syncope and collapse: Secondary | ICD-10-CM | POA: Insufficient documentation

## 2014-11-05 DIAGNOSIS — Z8619 Personal history of other infectious and parasitic diseases: Secondary | ICD-10-CM | POA: Insufficient documentation

## 2014-11-05 DIAGNOSIS — Z794 Long term (current) use of insulin: Secondary | ICD-10-CM | POA: Diagnosis not present

## 2014-11-05 DIAGNOSIS — Z8719 Personal history of other diseases of the digestive system: Secondary | ICD-10-CM | POA: Diagnosis not present

## 2014-11-05 DIAGNOSIS — Z85038 Personal history of other malignant neoplasm of large intestine: Secondary | ICD-10-CM | POA: Diagnosis not present

## 2014-11-05 DIAGNOSIS — F319 Bipolar disorder, unspecified: Secondary | ICD-10-CM | POA: Insufficient documentation

## 2014-11-05 DIAGNOSIS — Z8669 Personal history of other diseases of the nervous system and sense organs: Secondary | ICD-10-CM | POA: Diagnosis not present

## 2014-11-05 DIAGNOSIS — Z79899 Other long term (current) drug therapy: Secondary | ICD-10-CM | POA: Insufficient documentation

## 2014-11-05 DIAGNOSIS — E162 Hypoglycemia, unspecified: Secondary | ICD-10-CM

## 2014-11-05 LAB — CBG MONITORING, ED: Glucose-Capillary: 101 mg/dL — ABNORMAL HIGH (ref 70–99)

## 2014-11-05 MED ORDER — SODIUM CHLORIDE 0.9 % IV BOLUS (SEPSIS): 1000.0000 mL | Freq: Once | INTRAVENOUS | Status: DC

## 2014-11-05 NOTE — ED Notes (Signed)
Bed: WA10 Expected date:  Expected time:  Means of arrival:  Comments: EMS- hypoglycemia 

## 2014-11-05 NOTE — ED Notes (Signed)
Pt reports yesterday she was packing and didn't eat dinner when she started to feel lightheaded and had a fall. Pt has abrasions to both knees and swelling and pain to R foot. Pt denies LOC, denies head injury during fall. Pt states she ate something after the fall and lightheadedness resolved. Pt took 27 units of Lantus this morning, didn't eat breakfast or lunch because she was packing and then fell asleep. This evening when pt took blood sugar it was 33, so pt's daughter called EMS. Pt denies any sx at this time, states she is not feeling dizzy/ lightheaded, no headache, no nausea, no vision changes. A&O x 4. Ambulated to bathroom with steady gait.

## 2014-11-05 NOTE — ED Notes (Signed)
Attempted IV access, pt would only let me attempt on one particular vein, stating that it was the best vein and she didn't want me to look anywhere else. Unable to obtain IV access, pt refused to let this RN or anyone else attempt another IV. Pt states she will let phlebotomist stick her for blood work.

## 2014-11-05 NOTE — ED Notes (Addendum)
Per EMS pt had hypogylcemic episode at home. Fire department arrived to patient's home and found  CBG to be at 22 upon arrival. EMS Took pt CBG and level was at 41 and 68. She was given Oral glucose tubes at site, having a total of 2. She was also given a peanut butter and honey sandwich and Reece cup bringing  CBG to 251 at 2057. Pt takes insulin at home. BP:121/86 HR: 49.  Pt's mother passed away last weekend and she is still very upset about it.  Pt also having some right foot pain post fall.

## 2014-11-06 ENCOUNTER — Ambulatory Visit (HOSPITAL_BASED_OUTPATIENT_CLINIC_OR_DEPARTMENT_OTHER): Payer: Medicaid Other | Admitting: Hematology and Oncology

## 2014-11-06 ENCOUNTER — Telehealth: Payer: Self-pay | Admitting: Hematology and Oncology

## 2014-11-06 ENCOUNTER — Ambulatory Visit (HOSPITAL_BASED_OUTPATIENT_CLINIC_OR_DEPARTMENT_OTHER): Payer: Medicaid Other

## 2014-11-06 ENCOUNTER — Other Ambulatory Visit (HOSPITAL_BASED_OUTPATIENT_CLINIC_OR_DEPARTMENT_OTHER): Payer: Medicaid Other

## 2014-11-06 ENCOUNTER — Ambulatory Visit: Payer: Medicaid Other

## 2014-11-06 ENCOUNTER — Encounter: Payer: Self-pay | Admitting: Hematology and Oncology

## 2014-11-06 VITALS — BP 128/69 | HR 73 | Temp 98.2°F | Resp 18 | Ht 66.0 in | Wt 165.5 lb

## 2014-11-06 DIAGNOSIS — C787 Secondary malignant neoplasm of liver and intrahepatic bile duct: Secondary | ICD-10-CM | POA: Diagnosis not present

## 2014-11-06 DIAGNOSIS — Z5111 Encounter for antineoplastic chemotherapy: Secondary | ICD-10-CM

## 2014-11-06 DIAGNOSIS — N183 Chronic kidney disease, stage 3 unspecified: Secondary | ICD-10-CM

## 2014-11-06 DIAGNOSIS — Z95828 Presence of other vascular implants and grafts: Secondary | ICD-10-CM

## 2014-11-06 DIAGNOSIS — C189 Malignant neoplasm of colon, unspecified: Secondary | ICD-10-CM

## 2014-11-06 DIAGNOSIS — E162 Hypoglycemia, unspecified: Secondary | ICD-10-CM

## 2014-11-06 DIAGNOSIS — C786 Secondary malignant neoplasm of retroperitoneum and peritoneum: Secondary | ICD-10-CM | POA: Diagnosis not present

## 2014-11-06 DIAGNOSIS — Z5112 Encounter for antineoplastic immunotherapy: Secondary | ICD-10-CM

## 2014-11-06 DIAGNOSIS — G622 Polyneuropathy due to other toxic agents: Secondary | ICD-10-CM

## 2014-11-06 DIAGNOSIS — R911 Solitary pulmonary nodule: Secondary | ICD-10-CM

## 2014-11-06 DIAGNOSIS — C182 Malignant neoplasm of ascending colon: Secondary | ICD-10-CM | POA: Diagnosis not present

## 2014-11-06 DIAGNOSIS — E1142 Type 2 diabetes mellitus with diabetic polyneuropathy: Secondary | ICD-10-CM

## 2014-11-06 DIAGNOSIS — G8929 Other chronic pain: Secondary | ICD-10-CM

## 2014-11-06 DIAGNOSIS — G893 Neoplasm related pain (acute) (chronic): Secondary | ICD-10-CM

## 2014-11-06 DIAGNOSIS — E1340 Other specified diabetes mellitus with diabetic neuropathy, unspecified: Secondary | ICD-10-CM

## 2014-11-06 DIAGNOSIS — G894 Chronic pain syndrome: Secondary | ICD-10-CM

## 2014-11-06 LAB — CBC
HEMATOCRIT: 41.5 % (ref 36.0–46.0)
Hemoglobin: 13.8 g/dL (ref 12.0–15.0)
MCH: 29.9 pg (ref 26.0–34.0)
MCHC: 33.3 g/dL (ref 30.0–36.0)
MCV: 90 fL (ref 78.0–100.0)
PLATELETS: 126 10*3/uL — AB (ref 150–400)
RBC: 4.61 MIL/uL (ref 3.87–5.11)
RDW: 15.2 % (ref 11.5–15.5)
WBC: 5.6 10*3/uL (ref 4.0–10.5)

## 2014-11-06 LAB — CBG MONITORING, ED: GLUCOSE-CAPILLARY: 215 mg/dL — AB (ref 70–99)

## 2014-11-06 LAB — CBC WITH DIFFERENTIAL/PLATELET
BASO%: 0.9 % (ref 0.0–2.0)
Basophils Absolute: 0 10*3/uL (ref 0.0–0.1)
EOS%: 3.3 % (ref 0.0–7.0)
Eosinophils Absolute: 0.2 10*3/uL (ref 0.0–0.5)
HCT: 41.4 % (ref 34.8–46.6)
HGB: 13.3 g/dL (ref 11.6–15.9)
LYMPH#: 1 10*3/uL (ref 0.9–3.3)
LYMPH%: 20.9 % (ref 14.0–49.7)
MCH: 28.4 pg (ref 25.1–34.0)
MCHC: 32 g/dL (ref 31.5–36.0)
MCV: 88.6 fL (ref 79.5–101.0)
MONO#: 0.5 10*3/uL (ref 0.1–0.9)
MONO%: 9.6 % (ref 0.0–14.0)
NEUT#: 3.1 10*3/uL (ref 1.5–6.5)
NEUT%: 65.3 % (ref 38.4–76.8)
Platelets: 158 10*3/uL (ref 145–400)
RBC: 4.67 10*6/uL (ref 3.70–5.45)
RDW: 15.7 % — AB (ref 11.2–14.5)
WBC: 4.8 10*3/uL (ref 3.9–10.3)

## 2014-11-06 LAB — BASIC METABOLIC PANEL
ANION GAP: 11 (ref 5–15)
BUN: 11 mg/dL (ref 6–23)
CHLORIDE: 98 mmol/L (ref 96–112)
CO2: 32 mmol/L (ref 19–32)
Calcium: 9.2 mg/dL (ref 8.4–10.5)
Creatinine, Ser: 1.23 mg/dL — ABNORMAL HIGH (ref 0.50–1.10)
GFR calc non Af Amer: 50 mL/min — ABNORMAL LOW (ref >90)
GFR, EST AFRICAN AMERICAN: 57 mL/min — AB (ref >90)
Glucose, Bld: 260 mg/dL — ABNORMAL HIGH (ref 70–99)
POTASSIUM: 4.1 mmol/L (ref 3.5–5.1)
Sodium: 141 mmol/L (ref 135–145)

## 2014-11-06 LAB — COMPREHENSIVE METABOLIC PANEL (CC13)
ALK PHOS: 110 U/L (ref 40–150)
ALT: 23 U/L (ref 0–55)
ANION GAP: 12 meq/L — AB (ref 3–11)
AST: 37 U/L — ABNORMAL HIGH (ref 5–34)
Albumin: 3.2 g/dL — ABNORMAL LOW (ref 3.5–5.0)
BILIRUBIN TOTAL: 0.41 mg/dL (ref 0.20–1.20)
BUN: 10.2 mg/dL (ref 7.0–26.0)
CO2: 30 mEq/L — ABNORMAL HIGH (ref 22–29)
Calcium: 9.1 mg/dL (ref 8.4–10.4)
Chloride: 102 mEq/L (ref 98–109)
Creatinine: 1.2 mg/dL — ABNORMAL HIGH (ref 0.6–1.1)
EGFR: 52 mL/min/{1.73_m2} — ABNORMAL LOW (ref >90)
Glucose: 95 mg/dl (ref 70–140)
Potassium: 3.7 mEq/L (ref 3.5–5.1)
SODIUM: 144 meq/L (ref 136–145)
TOTAL PROTEIN: 7 g/dL (ref 6.4–8.3)

## 2014-11-06 MED ORDER — ONDANSETRON HCL 40 MG/20ML IJ SOLN
Freq: Once | INTRAMUSCULAR | Status: AC
  Administered 2014-11-06: 12:00:00 via INTRAVENOUS
  Filled 2014-11-06: qty 4

## 2014-11-06 MED ORDER — SODIUM CHLORIDE 0.9 % IV SOLN
Freq: Once | INTRAVENOUS | Status: AC
  Administered 2014-11-06: 11:00:00 via INTRAVENOUS

## 2014-11-06 MED ORDER — SODIUM CHLORIDE 0.9 % IV SOLN
5.0000 mg/kg | Freq: Once | INTRAVENOUS | Status: AC
  Administered 2014-11-06: 375 mg via INTRAVENOUS
  Filled 2014-11-06: qty 15

## 2014-11-06 MED ORDER — FLUOROURACIL CHEMO INJECTION 5 GM/100ML
2400.0000 mg/m2 | INTRAVENOUS | Status: DC
  Administered 2014-11-06: 4150 mg via INTRAVENOUS
  Filled 2014-11-06: qty 83

## 2014-11-06 MED ORDER — SODIUM CHLORIDE 0.9 % IJ SOLN
10.0000 mL | INTRAMUSCULAR | Status: DC | PRN
  Administered 2014-11-06: 10 mL via INTRAVENOUS
  Filled 2014-11-06: qty 10

## 2014-11-06 MED ORDER — SODIUM CHLORIDE 0.9 % IJ SOLN
10.0000 mL | INTRAMUSCULAR | Status: DC | PRN
  Filled 2014-11-06: qty 10

## 2014-11-06 MED ORDER — HEPARIN SOD (PORK) LOCK FLUSH 100 UNIT/ML IV SOLN
500.0000 [IU] | Freq: Once | INTRAVENOUS | Status: DC | PRN
  Filled 2014-11-06: qty 5

## 2014-11-06 MED ORDER — LEUCOVORIN CALCIUM INJECTION 350 MG
400.0000 mg/m2 | Freq: Once | INTRAMUSCULAR | Status: AC
  Administered 2014-11-06: 692 mg via INTRAVENOUS
  Filled 2014-11-06: qty 34.6

## 2014-11-06 NOTE — Assessment & Plan Note (Signed)
She has significant recurrent hypoglycemia due to poor oral intake. I recommend she reduce her insulin Lantus from 27 units to 20 units

## 2014-11-06 NOTE — Assessment & Plan Note (Signed)
Recent imaging study show positive response to treatment. CT scan of the chest was compared with prior scan which is over 70 months old which show evidence of lung nodules. This does not surprise me. I do not feel that the lung nodules are metastatic disease that grew on recent treatment, rather it probably grew when she was getting treatment break. The size of the lymph node and endometrial mass has regressed in size. Overall, she has positive response to treatment. However, due to worsening severe peripheral neuropathy, I plan to drop oxaliplatin but continue on 5-FU and Avastin.  I will proceed with treatment today with her dose adjustment. I plan to repeat CT scan next month for further assessment.

## 2014-11-06 NOTE — Assessment & Plan Note (Signed)
She has chronic pain disorder and cancer related pain. The patient is also taking Suboxone. I reinforced narcotic refill policy with the patient. I do not think it makes sense for her to be on 2 different pain medicine and I would not prescribe Dilaudid anymore as she is on Suboxone.

## 2014-11-06 NOTE — Progress Notes (Signed)
[place] OFFICE PROGRESS NOTE  Patient Care Team: Elberta Leatherwood, MD as PCP - General Fleet Contras, MD as Consulting Physician (Nephrology) Leatrice Jewels Rayetta Pigg, MD as Consulting Physician (Psychiatry) Heath Lark, MD as Consulting Physician (Hematology and Oncology)  SUMMARY OF ONCOLOGIC HISTORY: Oncology History   Colon cancer, KRAS positive   Primary site: Colon and Rectum (Right)   Staging method: AJCC 7th Edition   Clinical: (T4b, N2b, M1) signed by Heath Lark, MD on 08/05/2013  9:52 AM   Pathologic: Stage IVB (T4b, N2b, M1b) signed by Heath Lark, MD on 08/05/2013 10:21 AM   Summary: Stage IVB (T4b, N2b, M1b)      Colon cancer   07/15/2013 - 07/25/2013 Hospital Admission The patient presented to the hospital with pain in her abdomen and underwent surgery for colon cancer   07/15/2013 Imaging CT scan of the abdomen show bowel dilatation suspicious for malignancy   07/17/2013 Tumor Marker Preoperative CEA was 2.2, normal   07/19/2013 Procedure Colonoscopy revealed ascending colon lesion with significant stricture and biopsy showed adenocarcinoma   07/20/2013 Surgery She underwent right hemicolectomy, cholecystectomy, liver biopsy, excision of abdominal wall mass and omental mass. During surgery, she was also found to have drop metastasis   08/29/2013 Imaging Repeat CT scan show persistent liver metastasis and new omental metastasis   08/31/2013 - 02/10/2014 Chemotherapy The patient was started on palliative chemotherapy with FOLFOX, bolus 5-FU committed and oxaliplatin dose reduced due to  peripheral neuropathy. She has completed 12 cycles of treatment.   11/22/2013 Imaging Repeat CT scan showed near complete response to treatment.   11/23/2013 - 02/10/2014 Chemotherapy Avastin was added to her chemotherapy regimen   12/21/2013 Adverse Reaction Oxaliplatin was omitted due to worsening peripheral neuropathy.   02/20/2014 Imaging Repeat CT scan showed near complete resolution  of all disease.   05/15/2014 Imaging Repeat CT scan showed peritoneal metastasis and recurrence of disease   05/22/2014 -  Chemotherapy Modify dosings of FOLFOX was restarted.   07/14/2014 Imaging CT scan of the abdomen and pelvis show positive response to treatment.   09/07/2014 Imaging Repeat CT scan of the chest, abdomen and pelvis shows stable disease.   09/08/2014 Adverse Reaction She will continue treatment without oxaliplatin due to severe peripheral neuropathy.    INTERVAL HISTORY: Please see below for problem oriented charting. She is seen today prior to chemotherapy. She lost her mother recently due to stage IV metastatic lung cancer to the brain. She have recurrent episodes of hypoglycemia with recurrent falls. She have sporadic diet. She complained of severe peripheral neuropathy pain from chemotherapy and from diabetes. Denies nausea and vomiting.   REVIEW OF SYSTEMS:   Constitutional: Denies fevers, chills or abnormal weight loss Eyes: Denies blurriness of vision Ears, nose, mouth, throat, and face: Denies mucositis or sore throat Respiratory: Denies cough, dyspnea or wheezes Cardiovascular: Denies palpitation, chest discomfort or lower extremity swelling Gastrointestinal:  Denies nausea, heartburn or change in bowel habits Skin: Denies abnormal skin rashes Lymphatics: Denies new lymphadenopathy or easy bruising Neurological:Denies numbness, tingling or new weaknesses Behavioral/Psych: Mood is stable, no new changes  All other systems were reviewed with the patient and are negative.  I have reviewed the past medical history, past surgical history, social history and family history with the patient and they are unchanged from previous note.  ALLERGIES:  is allergic to naproxen.  MEDICATIONS:  Current Outpatient Prescriptions  Medication Sig Dispense Refill  . atorvastatin (LIPITOR) 40 MG tablet Take 1 tablet (  40 mg total) by mouth daily. 30 tablet 5  . Buprenorphine  HCl-Naloxone HCl (SUBOXONE SL) Place 8 mg under the tongue 3 (three) times daily.    . clonazePAM (KLONOPIN) 1 MG tablet Take 1 mg by mouth 3 (three) times daily.     . diazepam (VALIUM) 10 MG tablet Take 10 mg by mouth every 8 (eight) hours as needed for anxiety.    . furosemide (LASIX) 40 MG tablet Take 1 tablet (40 mg total) by mouth 2 (two) times daily. 60 tablet 5  . gabapentin (NEURONTIN) 800 MG tablet Take 800 mg by mouth 3 (three) times daily.    Marland Kitchen glucose blood (ACCU-CHEK AVIVA PLUS) test strip USE AS DIRECTED 100 each 10  . HYDROmorphone (DILAUDID) 4 MG tablet Take 1 tablet (4 mg total) by mouth 3 (three) times daily as needed for severe pain. 90 tablet 0  . hydrOXYzine (ATARAX/VISTARIL) 50 MG tablet Take 25 mg by mouth 3 (three) times daily.    Marland Kitchen HYDROXYZINE PAMOATE PO Take 25 mg by mouth 3 (three) times daily.    . insulin glargine (LANTUS) 100 UNIT/ML injection Inject 0.27 mLs (27 Units total) into the skin daily. 10 mL 3  . insulin lispro (HUMALOG) 100 UNIT/ML injection Inject 1-10 Units into the skin 3 (three) times daily before meals.    . lamoTRIgine (LAMICTAL) 100 MG tablet Take 200 mg by mouth every morning.     . lidocaine-prilocaine (EMLA) cream Apply 1 application topically as needed (for pain).    Marland Kitchen omeprazole (PRILOSEC) 20 MG capsule Take 20 mg by mouth every morning.    . polyethylene glycol powder (GLYCOLAX/MIRALAX) powder Take 17 g by mouth 2 (two) times daily as needed. (Patient taking differently: Take 17 g by mouth 2 (two) times daily as needed for mild constipation. ) 3350 g 3  . ROZEREM 8 MG tablet Take 8 mg by mouth at bedtime.  0  . senna-docusate (SENOKOT-S) 8.6-50 MG per tablet Take 1 tablet by mouth 2 (two) times daily. 90 tablet 3  . ziprasidone (GEODON) 80 MG capsule Take 160 mg by mouth at bedtime.      No current facility-administered medications for this visit.   Facility-Administered Medications Ordered in Other Visits  Medication Dose Route Frequency  Provider Last Rate Last Dose  . bevacizumab (AVASTIN) 375 mg in sodium chloride 0.9 % 100 mL chemo infusion  5 mg/kg (Treatment Plan Actual) Intravenous Once Heath Lark, MD      . fluorouracil (ADRUCIL) 4,150 mg in sodium chloride 0.9 % 150 mL chemo infusion  2,400 mg/m2 (Treatment Plan Adjusted) Intravenous 1 day or 1 dose Heath Lark, MD      . heparin lock flush 100 unit/mL  500 Units Intracatheter Once PRN Heath Lark, MD      . leucovorin 692 mg in dextrose 5 % 250 mL infusion  400 mg/m2 (Treatment Plan Adjusted) Intravenous Once Heath Lark, MD      . ondansetron (ZOFRAN) 8 mg in sodium chloride 0.9 % 50 mL IVPB   Intravenous Once Heath Lark, MD      . sodium chloride 0.9 % injection 10 mL  10 mL Intravenous PRN Heath Lark, MD   10 mL at 09/25/14 0913  . sodium chloride 0.9 % injection 10 mL  10 mL Intracatheter PRN Heath Lark, MD        PHYSICAL EXAMINATION: ECOG PERFORMANCE STATUS: 1 - Symptomatic but completely ambulatory  Filed Vitals:   11/06/14 1055  BP:  128/69  Pulse: 73  Temp: 98.2 F (36.8 C)  Resp: 18   Filed Weights   11/06/14 1055  Weight: 165 lb 8 oz (75.07 kg)    GENERAL:alert, no distress and comfortable SKIN: skin color, texture, turgor are normal, no rashes or significant lesions EYES: normal, Conjunctiva are pink and non-injected, sclera clear OROPHARYNX:no exudate, no erythema and lips, buccal mucosa, and tongue normal  NECK: supple, thyroid normal size, non-tender, without nodularity LYMPH:  no palpable lymphadenopathy in the cervical, axillary or inguinal LUNGS: clear to auscultation and percussion with normal breathing effort HEART: regular rate & rhythm and no murmurs and no lower extremity edema ABDOMEN:abdomen soft, non-tender and normal bowel sounds Musculoskeletal:no cyanosis of digits and no clubbing  NEURO: alert & oriented x 3 with fluent speech, no focal motor/sensory deficits  LABORATORY DATA:  I have reviewed the data as listed    Component  Value Date/Time   NA 141 11/05/2014 2355   NA 141 10/23/2014 0952   K 4.1 11/05/2014 2355   K 4.0 10/23/2014 0952   CL 98 11/05/2014 2355   CO2 32 11/05/2014 2355   CO2 30* 10/23/2014 0952   GLUCOSE 260* 11/05/2014 2355   GLUCOSE 196* 10/23/2014 0952   BUN 11 11/05/2014 2355   BUN 12.2 10/23/2014 0952   CREATININE 1.23* 11/05/2014 2355   CREATININE 1.2* 10/23/2014 0952   CREATININE 1.38* 05/26/2013 1703   CALCIUM 9.2 11/05/2014 2355   CALCIUM 9.2 10/23/2014 0952   PROT 7.2 10/23/2014 0952   PROT 7.1 05/14/2014 2233   ALBUMIN 3.2* 10/23/2014 0952   ALBUMIN 3.1* 05/14/2014 2233   AST 21 10/23/2014 0952   AST 28 05/14/2014 2233   ALT 16 10/23/2014 0952   ALT 18 05/14/2014 2233   ALKPHOS 126 10/23/2014 0952   ALKPHOS 124* 05/14/2014 2233   BILITOT 0.47 10/23/2014 0952   BILITOT 0.2* 05/14/2014 2233   GFRNONAA 50* 11/05/2014 2355   GFRNONAA 44* 05/26/2013 1703   GFRAA 57* 11/05/2014 2355   GFRAA 51* 05/26/2013 1703    No results found for: SPEP, UPEP  Lab Results  Component Value Date   WBC 4.8 11/06/2014   NEUTROABS 3.1 11/06/2014   HGB 13.3 11/06/2014   HCT 41.4 11/06/2014   MCV 88.6 11/06/2014   PLT 158 11/06/2014      Chemistry      Component Value Date/Time   NA 141 11/05/2014 2355   NA 141 10/23/2014 0952   K 4.1 11/05/2014 2355   K 4.0 10/23/2014 0952   CL 98 11/05/2014 2355   CO2 32 11/05/2014 2355   CO2 30* 10/23/2014 0952   BUN 11 11/05/2014 2355   BUN 12.2 10/23/2014 0952   CREATININE 1.23* 11/05/2014 2355   CREATININE 1.2* 10/23/2014 0952   CREATININE 1.38* 05/26/2013 1703      Component Value Date/Time   CALCIUM 9.2 11/05/2014 2355   CALCIUM 9.2 10/23/2014 0952   ALKPHOS 126 10/23/2014 0952   ALKPHOS 124* 05/14/2014 2233   AST 21 10/23/2014 0952   AST 28 05/14/2014 2233   ALT 16 10/23/2014 0952   ALT 18 05/14/2014 2233   BILITOT 0.47 10/23/2014 0952   BILITOT 0.2* 05/14/2014 2233      ASSESSMENT & PLAN:  Colon cancer Recent  imaging study show positive response to treatment. CT scan of the chest was compared with prior scan which is over 15 months old which show evidence of lung nodules. This does not surprise me. I do not  feel that the lung nodules are metastatic disease that grew on recent treatment, rather it probably grew when she was getting treatment break. The size of the lymph node and endometrial mass has regressed in size. Overall, she has positive response to treatment. However, due to worsening severe peripheral neuropathy, I plan to drop oxaliplatin but continue on 5-FU and Avastin.  I will proceed with treatment today with her dose adjustment. I plan to repeat CT scan next month for further assessment.    Metastasis to liver She tolerated treatment well and has no evidence of new liver metastasis. Liver function tests are normal.     CKD (chronic kidney disease), stage III Her kidney function tests is unaffected by recent chemotherapy. Continue close observation.      Chronic pain disorder She has chronic pain disorder and cancer related pain. The patient is also taking Suboxone. I reinforced narcotic refill policy with the patient. I do not think it makes sense for her to be on 2 different pain medicine and I would not prescribe Dilaudid anymore as she is on Suboxone.     Neuropathy due to secondary diabetes This is slightly worse while she is on oxaliplatin. I have omitted oxaliplatin. She may have component of neuropathy from diabetes.     Hypoglycemia She has significant recurrent hypoglycemia due to poor oral intake. I recommend she reduce her insulin Lantus from 27 units to 20 units    Orders Placed This Encounter  Procedures  . CT Chest W Contrast    Standing Status: Future     Number of Occurrences:      Standing Expiration Date: 01/06/2016    Order Specific Question:  Reason for Exam (SYMPTOM  OR DIAGNOSIS REQUIRED)    Answer:  staging metatstatic colon ca, assess  response to Rx    Order Specific Question:  Is the patient pregnant?    Answer:  No    Order Specific Question:  Preferred imaging location?    Answer:  [facility]  . CT Abdomen Pelvis W Contrast    Standing Status: Future     Number of Occurrences:      Standing Expiration Date: 02/06/2016    Order Specific Question:  Reason for Exam (SYMPTOM  OR DIAGNOSIS REQUIRED)    Answer:  staging metatsatic colon ca, staging    Order Specific Question:  Is the patient pregnant?    Answer:  No    Order Specific Question:  Preferred imaging location?    Answer:  [facility]   All questions were answered. The patient knows to call the clinic with any problems, questions or concerns. No barriers to learning was detected. I spent 30 minutes counseling the patient face to face. The total time spent in the appointment was 40 minutes and more than 50% was on counseling and review of test results     [facility], [place], MD 11/06/2014 11:25 AM

## 2014-11-06 NOTE — ED Provider Notes (Signed)
CSN: [id number]     Arrival date & time 11/05/14  2107 History   First MD Initiated Contact with Patient 11/05/14 2308     Chief Complaint  Patient presents with  . Hypoglycemia  . Near Syncope     Patient reports that her blood sugar was low last night.  Her daughter then came home from a friend's house and found her somewhat confused and having difficulty walking and her blood sugar was low at that time.  The patient began eating pudding at that time.  Later she was found to be confused again and when fire showed up her blood sugar was 22.  She states that she is compliant with her medications but that most of the day she slept today and did not eat much at all.  She has not seen her primary care doctor in a while.  She has been calling in to get refills of her medications.  She denies nausea or vomiting.  No urinary complaints. Patient is a 53 y.o. female presenting with hypoglycemia and near-syncope.  Hypoglycemia Near Syncope    Past Medical History  Diagnosis Date  . Diabetes mellitus   . Bipolar disorder   . Depression   . Hepatitis C   . Psychotic episode   . CORNEAL ABRASION, RIGHT 08/20/2010    Qualifier: Diagnosis of  By: Hassell Done FNP, Tori Milks    . Complication of anesthesia     Difficulty putting to sllep- done at [facility]  . Anxiety   . IBS (irritable bowel syndrome)   . PONV (postoperative nausea and vomiting)   . Subcutaneous nodule of anterior left knee 05/01/2013  . Dysmenorrhea 09/05/2013  . Leukopenia due to antineoplastic chemotherapy 09/28/2013  . Thrombocytopenia due to drugs 09/28/2013  . Neuropathy due to secondary diabetes 10/12/2013  . Constipation 04/20/2014  . Colon cancer 07/22/2013   Past Surgical History  Procedure Laterality Date  . Cesarean section    . Colonoscopy N/A 07/19/2013    Procedure: COLONOSCOPY;  Surgeon: Beryle Beams, MD;  Location: [place];  Service: Endoscopy;  Laterality: N/A;  . Partial colectomy Right 07/20/2013    Procedure:  PARTIAL COLECTOMY;  Surgeon: Zenovia Jarred, MD;  Location: [place];  Service: General;  Laterality: Right;  . Cholecystectomy N/A 07/20/2013    Procedure: CHOLECYSTECTOMY;  Surgeon: Zenovia Jarred, MD;  Location: [place];  Service: General;  Laterality: N/A;  . Liver surgery      small amt os scar tissue removed with colon surgery.  . Portacath placement Left 08/12/2013    Procedure: INSERTION PORT-A-CATH;  Surgeon: Zenovia Jarred, MD;  Location: [place];  Service: General;  Laterality: Left;   No family history on file. History  Substance Use Topics  . Smoking status: Current Every Day Smoker -- 0.50 packs/day for 20 years    Types: Cigarettes  . Smokeless tobacco: Never Used     Comment: smokes 6 cig/daily. uses the patch  . Alcohol Use: No   OB History    No data available     Review of Systems  Cardiovascular: Positive for near-syncope.  All other systems reviewed and are negative.     Allergies  Naproxen  Home Medications   Prior to Admission medications   Medication Sig Start Date End Date Taking? Authorizing Provider  atorvastatin (LIPITOR) 40 MG tablet Take 1 tablet (40 mg total) by mouth daily. 08/27/14  Yes Elberta Leatherwood, MD  Buprenorphine HCl-Naloxone HCl (SUBOXONE SL) Place 8  mg under the tongue 3 (three) times daily.   Yes Historical Provider, MD  clonazePAM (KLONOPIN) 1 MG tablet Take 1 mg by mouth 3 (three) times daily.    Yes Historical Provider, MD  diazepam (VALIUM) 10 MG tablet Take 10 mg by mouth every 8 (eight) hours as needed for anxiety.   Yes Historical Provider, MD  furosemide (LASIX) 40 MG tablet Take 1 tablet (40 mg total) by mouth 2 (two) times daily. 09/28/12  Yes Angelica Ran, MD  gabapentin (NEURONTIN) 800 MG tablet Take 800 mg by mouth 3 (three) times daily.   Yes Historical Provider, MD  hydrOXYzine (ATARAX/VISTARIL) 50 MG tablet Take 25 mg by mouth 3 (three) times daily.   Yes Historical Provider, MD  insulin glargine (LANTUS) 100 UNIT/ML  injection Inject 0.27 mLs (27 Units total) into the skin daily. 08/27/14  Yes Elberta Leatherwood, MD  insulin lispro (HUMALOG) 100 UNIT/ML injection Inject 1-10 Units into the skin 3 (three) times daily before meals.   Yes Historical Provider, MD  lamoTRIgine (LAMICTAL) 100 MG tablet Take 200 mg by mouth every morning.    Yes Historical Provider, MD  lidocaine-prilocaine (EMLA) cream Apply 1 application topically as needed (for pain). 08/05/13  Yes Heath Lark, MD  omeprazole (PRILOSEC) 20 MG capsule Take 20 mg by mouth every morning.   Yes Historical Provider, MD  ziprasidone (GEODON) 80 MG capsule Take 160 mg by mouth at bedtime.    Yes Historical Provider, MD  glucose blood (ACCU-CHEK AVIVA PLUS) test strip USE AS DIRECTED 07/24/14   Elberta Leatherwood, MD  HYDROmorphone (DILAUDID) 4 MG tablet Take 1 tablet (4 mg total) by mouth 3 (three) times daily as needed for severe pain. Patient not taking: Reported on 11/05/2014 09/08/14   Heath Lark, MD  HYDROXYZINE PAMOATE PO Take 25 mg by mouth 3 (three) times daily.    Historical Provider, MD  polyethylene glycol powder (GLYCOLAX/MIRALAX) powder Take 17 g by mouth 2 (two) times daily as needed. Patient taking differently: Take 17 g by mouth 2 (two) times daily as needed for mild constipation.  04/05/14   Elberta Leatherwood, MD  ROZEREM 8 MG tablet Take 8 mg by mouth at bedtime. 06/06/14   Historical Provider, MD  senna-docusate (SENOKOT-S) 8.6-50 MG per tablet Take 1 tablet by mouth 2 (two) times daily. Patient not taking: Reported on 11/05/2014 04/20/14   Heath Lark, MD   BP 131/88 mmHg  Pulse 58  Temp(Src) 97.4 F (36.3 C) (Axillary)  Resp 16  SpO2 100% Physical Exam  Constitutional: She is oriented to person, place, and time. She appears well-developed and well-nourished. No distress.  HENT:  Head: Normocephalic and atraumatic.  Eyes: EOM are normal.  Neck: Normal range of motion.  Cardiovascular: Normal rate, regular rhythm and normal heart sounds.   Pulmonary/Chest:  Effort normal and breath sounds normal.  Abdominal: Soft. She exhibits no distension. There is no tenderness.  Musculoskeletal: Normal range of motion.  Neurological: She is alert and oriented to person, place, and time.  Skin: Skin is warm and dry.  Psychiatric: She has a normal mood and affect. Judgment normal.  Nursing note and vitals reviewed.   ED Course  Procedures (including critical care time) Labs Review Labs Reviewed  CBC - Abnormal; Notable for the following:    Platelets 126 (*)    All other components within normal limits  BASIC METABOLIC PANEL - Abnormal; Notable for the following:    Glucose, Bld 260 (*)  Creatinine, Ser 1.23 (*)    GFR calc non Af Amer 50 (*)    GFR calc Af Amer 57 (*)    All other components within normal limits  CBG MONITORING, ED - Abnormal; Notable for the following:    Glucose-Capillary 101 (*)    All other components within normal limits  CBG MONITORING, ED - Abnormal; Notable for the following:    Glucose-Capillary 215 (*)    All other components within normal limits    Imaging Review No results found.   EKG Interpretation None      MDM   Final diagnoses:  Hypoglycemia    Patient's blood sugar has maintained up while in the emergency department.  She's tolerating oral fluids.  She will decrease her home insulin by 4 units.  We discussed this.  She'll follow-up with the primary care doctor.  I suspect much of this was secondary to not eating today but yet she took her insulin.  Overall well-appearing.  Family reports mental status.  Discharge home in good condition.    Jola Schmidt, MD 11/06/14 [phone]

## 2014-11-06 NOTE — Assessment & Plan Note (Signed)
She tolerated treatment well and has no evidence of new liver metastasis. Liver function tests are normal.

## 2014-11-06 NOTE — Patient Instructions (Signed)
[place] [place] Discharge Instructions for Patients Receiving Chemotherapy  Today you received the following chemotherapy agents Leucovorin  To help prevent nausea and vomiting after your treatment, we encourage you to take your nausea medication as directed  If you develop nausea and vomiting that is not controlled by your nausea medication, call the clinic.   BELOW ARE SYMPTOMS THAT SHOULD BE REPORTED IMMEDIATELY:  *FEVER GREATER THAN 100.5 F  *CHILLS WITH OR WITHOUT FEVER  NAUSEA AND VOMITING THAT IS NOT CONTROLLED WITH YOUR NAUSEA MEDICATION  *UNUSUAL SHORTNESS OF BREATH  *UNUSUAL BRUISING OR BLEEDING  TENDERNESS IN MOUTH AND THROAT WITH OR WITHOUT PRESENCE OF ULCERS  *URINARY PROBLEMS  *BOWEL PROBLEMS  UNUSUAL RASH Items with * indicate a potential emergency and should be followed up as soon as possible.  Feel free to call the clinic you have any questions or concerns. The clinic phone number is [phone].  Please show the [place] at check-in to the Emergency Department and triage nurse.

## 2014-11-06 NOTE — Patient Instructions (Signed)

## 2014-11-06 NOTE — Assessment & Plan Note (Signed)
This is slightly worse while she is on oxaliplatin. I have omitted oxaliplatin. She may have component of neuropathy from diabetes.

## 2014-11-06 NOTE — Assessment & Plan Note (Signed)
Her kidney function tests is unaffected by recent chemotherapy. Continue close observation.

## 2014-11-06 NOTE — Discharge Instructions (Signed)
Hypoglycemia °Hypoglycemia occurs when the glucose in your blood is too low. Glucose is a type of sugar that is your body's main energy source. Hormones, such as insulin and glucagon, control the level of glucose in the blood. Insulin lowers blood glucose and glucagon increases blood glucose. Having too much insulin in your blood stream, or not eating enough food containing sugar, can result in hypoglycemia. Hypoglycemia can happen to people with or without diabetes. It can develop quickly and can be a medical emergency.  °CAUSES  °· Missing or delaying meals. °· Not eating enough carbohydrates at meals. °· Taking too much diabetes medicine. °· Not timing your oral diabetes medicine or insulin doses with meals, snacks, and exercise. °· Nausea and vomiting. °· Certain medicines. °· Severe illnesses, such as hepatitis, kidney disorders, and certain eating disorders. °· Increased activity or exercise without eating something extra or adjusting medicines. °· Drinking too much alcohol. °· A nerve disorder that affects body functions like your heart rate, blood pressure, and digestion (autonomic neuropathy). °· A condition where the stomach muscles do not function properly (gastroparesis). Therefore, medicines and food may not absorb properly. °· Rarely, a tumor of the pancreas can produce too much insulin. °SYMPTOMS  °· Hunger. °· Sweating (diaphoresis). °· Change in body temperature. °· Shakiness. °· Headache. °· Anxiety. °· Lightheadedness. °· Irritability. °· Difficulty concentrating. °· Dry mouth. °· Tingling or numbness in the hands or feet. °· Restless sleep or sleep disturbances. °· Altered speech and coordination. °· Change in mental status. °· Seizures or prolonged convulsions. °· Combativeness. °· Drowsiness (lethargic). °· Weakness. °· Increased heart rate or palpitations. °· Confusion. °· Pale, gray skin color. °· Blurred or double vision. °· Fainting. °DIAGNOSIS  °A physical exam and medical history will be  performed. Your caregiver may make a diagnosis based on your symptoms. Blood tests and other lab tests may be performed to confirm a diagnosis. Once the diagnosis is made, your caregiver will see if your signs and symptoms go away once your blood glucose is raised.  °TREATMENT  °Usually, you can easily treat your hypoglycemia when you notice symptoms. °· Check your blood glucose. If it is less than 70 mg/dl, take one of the following:   °¨ 3-4 glucose tablets.   °¨ ½ cup juice.   °¨ ½ cup regular soda.   °¨ 1 cup skim milk.   °¨ ½-1 tube of glucose gel.   °¨ 5-6 hard candies.   °· Avoid high-fat drinks or food that may delay a rise in blood glucose levels. °· Do not take more than the recommended amount of sugary foods, drinks, gel, or tablets. Doing so will cause your blood glucose to go too high.   °· Wait 10-15 minutes and recheck your blood glucose. If it is still less than 70 mg/dl or below your target range, repeat treatment.   °· Eat a snack if it is more than 1 hour until your next meal.   °There may be a time when your blood glucose may go so low that you are unable to treat yourself at home when you start to notice symptoms. You may need someone to help you. You may even faint or be unable to swallow. If you cannot treat yourself, someone will need to bring you to the hospital.  °HOME CARE INSTRUCTIONS °· If you have diabetes, follow your diabetes management plan by: °¨ Taking your medicines as directed. °¨ Following your exercise plan. °¨ Following your meal plan. Do not skip meals. Eat on time. °¨ Testing your blood   glucose regularly. Check your blood glucose before and after exercise. If you exercise longer or different than usual, be sure to check blood glucose more frequently. °¨ Wearing your medical alert jewelry that says you have diabetes. °· Identify the cause of your hypoglycemia. Then, develop ways to prevent the recurrence of hypoglycemia. °· Do not take a hot bath or shower right after an  insulin shot. °· Always carry treatment with you. Glucose tablets are the easiest to carry. °· If you are going to drink alcohol, drink it only with meals. °· Tell friends or family members ways to keep you safe during a seizure. This may include removing hard or sharp objects from the area or turning you on your side. °· Maintain a healthy weight. °SEEK MEDICAL CARE IF:  °· You are having problems keeping your blood glucose in your target range. °· You are having frequent episodes of hypoglycemia. °· You feel you might be having side effects from your medicines. °· You are not sure why your blood glucose is dropping so low. °· You notice a change in vision or a new problem with your vision. °SEEK IMMEDIATE MEDICAL CARE IF:  °· Confusion develops. °· A change in mental status occurs. °· The inability to swallow develops. °· Fainting occurs. °Document Released: 07/21/2005 Document Revised: 07/26/2013 Document Reviewed: 11/17/2011 °Exit[employer]® Patient Information ©2015 [employer]. This information is not intended to replace advice given to you by your health care provider. Make sure you discuss any questions you have with your health care provider. ° °

## 2014-11-06 NOTE — Telephone Encounter (Signed)
added appt per pof....pt will get new sched today in tx.

## 2014-11-08 ENCOUNTER — Ambulatory Visit (HOSPITAL_BASED_OUTPATIENT_CLINIC_OR_DEPARTMENT_OTHER): Payer: Medicaid Other

## 2014-11-08 ENCOUNTER — Encounter (HOSPITAL_COMMUNITY): Payer: Self-pay | Admitting: Emergency Medicine

## 2014-11-08 ENCOUNTER — Other Ambulatory Visit: Payer: Self-pay | Admitting: Family Medicine

## 2014-11-08 ENCOUNTER — Emergency Department (HOSPITAL_COMMUNITY)
Admission: EM | Admit: 2014-11-08 | Discharge: 2014-11-08 | Discharge status: Against Medical Advice | Payer: Medicaid Other | Attending: Emergency Medicine | Admitting: Emergency Medicine

## 2014-11-08 DIAGNOSIS — Z79899 Other long term (current) drug therapy: Secondary | ICD-10-CM | POA: Insufficient documentation

## 2014-11-08 DIAGNOSIS — G629 Polyneuropathy, unspecified: Secondary | ICD-10-CM | POA: Insufficient documentation

## 2014-11-08 DIAGNOSIS — Z85038 Personal history of other malignant neoplasm of large intestine: Secondary | ICD-10-CM | POA: Insufficient documentation

## 2014-11-08 DIAGNOSIS — Z794 Long term (current) use of insulin: Secondary | ICD-10-CM | POA: Diagnosis not present

## 2014-11-08 DIAGNOSIS — C786 Secondary malignant neoplasm of retroperitoneum and peritoneum: Secondary | ICD-10-CM

## 2014-11-08 DIAGNOSIS — Z8619 Personal history of other infectious and parasitic diseases: Secondary | ICD-10-CM | POA: Insufficient documentation

## 2014-11-08 DIAGNOSIS — C787 Secondary malignant neoplasm of liver and intrahepatic bile duct: Secondary | ICD-10-CM | POA: Diagnosis not present

## 2014-11-08 DIAGNOSIS — Z87828 Personal history of other (healed) physical injury and trauma: Secondary | ICD-10-CM | POA: Insufficient documentation

## 2014-11-08 DIAGNOSIS — Z8742 Personal history of other diseases of the female genital tract: Secondary | ICD-10-CM | POA: Diagnosis not present

## 2014-11-08 DIAGNOSIS — C182 Malignant neoplasm of ascending colon: Secondary | ICD-10-CM

## 2014-11-08 DIAGNOSIS — Z862 Personal history of diseases of the blood and blood-forming organs and certain disorders involving the immune mechanism: Secondary | ICD-10-CM | POA: Diagnosis not present

## 2014-11-08 DIAGNOSIS — Z8719 Personal history of other diseases of the digestive system: Secondary | ICD-10-CM | POA: Diagnosis not present

## 2014-11-08 DIAGNOSIS — Z720 Tobacco use: Secondary | ICD-10-CM | POA: Insufficient documentation

## 2014-11-08 DIAGNOSIS — F419 Anxiety disorder, unspecified: Secondary | ICD-10-CM | POA: Insufficient documentation

## 2014-11-08 DIAGNOSIS — E11649 Type 2 diabetes mellitus with hypoglycemia without coma: Secondary | ICD-10-CM | POA: Diagnosis present

## 2014-11-08 DIAGNOSIS — F319 Bipolar disorder, unspecified: Secondary | ICD-10-CM | POA: Insufficient documentation

## 2014-11-08 DIAGNOSIS — C189 Malignant neoplasm of colon, unspecified: Secondary | ICD-10-CM

## 2014-11-08 DIAGNOSIS — E162 Hypoglycemia, unspecified: Secondary | ICD-10-CM

## 2014-11-08 LAB — BASIC METABOLIC PANEL
Anion gap: 13 (ref 5–15)
BUN: 15 mg/dL (ref 6–23)
CALCIUM: 9.1 mg/dL (ref 8.4–10.5)
CO2: 29 mmol/L (ref 19–32)
Chloride: 98 mmol/L (ref 96–112)
Creatinine, Ser: 1.11 mg/dL — ABNORMAL HIGH (ref 0.50–1.10)
GFR calc Af Amer: 65 mL/min — ABNORMAL LOW (ref >90)
GFR, EST NON AFRICAN AMERICAN: 56 mL/min — AB (ref >90)
Glucose, Bld: 201 mg/dL — ABNORMAL HIGH (ref 70–99)
Potassium: 3.7 mmol/L (ref 3.5–5.1)
Sodium: 140 mmol/L (ref 135–145)

## 2014-11-08 LAB — CBC
HEMATOCRIT: 42.4 % (ref 36.0–46.0)
Hemoglobin: 13.6 g/dL (ref 12.0–15.0)
MCH: 28.8 pg (ref 26.0–34.0)
MCHC: 32.1 g/dL (ref 30.0–36.0)
MCV: 89.6 fL (ref 78.0–100.0)
Platelets: 121 10*3/uL — ABNORMAL LOW (ref 150–400)
RBC: 4.73 MIL/uL (ref 3.87–5.11)
RDW: 15 % (ref 11.5–15.5)
WBC: 5.7 10*3/uL (ref 4.0–10.5)

## 2014-11-08 LAB — CBG MONITORING, ED: Glucose-Capillary: 110 mg/dL — ABNORMAL HIGH (ref 70–99)

## 2014-11-08 MED ORDER — HEPARIN SOD (PORK) LOCK FLUSH 100 UNIT/ML IV SOLN
500.0000 [IU] | Freq: Once | INTRAVENOUS | Status: AC | PRN
  Administered 2014-11-08: 500 [IU]
  Filled 2014-11-08: qty 5

## 2014-11-08 MED ORDER — SODIUM CHLORIDE 0.9 % IJ SOLN
10.0000 mL | INTRAMUSCULAR | Status: DC | PRN
  Administered 2014-11-08: 10 mL
  Filled 2014-11-08: qty 10

## 2014-11-08 NOTE — ED Notes (Signed)
Pt requesting to leave and then come back. Patient made aware that if she leaves she will need  To check back in.

## 2014-11-08 NOTE — Patient Instructions (Signed)
Fluorouracil, 5FU; Diclofenac topical cream What is this medicine? FLUOROURACIL; DICLOFENAC (flure oh YOOR a sil; dye KLOE fen ak) is a combination of a topical chemotherapy agent and non-steroidal anti-inflammatory drug (NSAID). It is used on the skin to treat skin cancer and skin conditions that could become cancer. This medicine may be used for other purposes; ask your health care provider or pharmacist if you have questions. COMMON BRAND NAME(S): FLUORAC What should I tell my health care provider before I take this medicine? They need to know if you have any of these conditions: -bleeding problems -cigarette smoker -DPD enzyme deficiency -heart disease -high blood pressure -if you frequently drink alcohol containing drinks -kidney disease -liver disease -open or infected skin -stomach problems -swelling or open sores at the treatment site -recent or planned coronary artery bypass graft (CABG) surgery -an unusual or allergic reaction to fluorouracil, diclofenac, aspirin, other NSAIDs, other medicines, foods, dyes, or preservatives -pregnant or trying to get pregnant -breast-feeding How should I use this medicine? This medicine is only for use on the skin. Follow the directions on the prescription label. Wash hands before and after use. Wash affected area and gently pat dry. To apply this medicine use a cotton-tipped applicator, or use gloves if applying with fingertips. If applied with unprotected fingertips, it is very important to wash your hands well after you apply this medicine. Avoid applying to the eyes, nose, or mouth. Apply enough medicine to cover the affected area. You can cover the area with a light gauze dressing, but do not use tight or air-tight dressings. Finish the full course prescribed by your doctor or health care professional, even if you think your condition is better. Do not stop taking except on the advice of your doctor or health care professional. Talk to your  pediatrician regarding the use of this medicine in children. Special care may be needed. Overdosage: If you think you've taken too much of this medicine contact a poison control center or emergency room at once. Overdosage: If you think you have taken too much of this medicine contact a poison control center or emergency room at once. NOTE: This medicine is only for you. Do not share this medicine with others. What if I miss a dose? If you miss a dose, apply it as soon as you can. If it is almost time for your next dose, only use that dose. Do not apply extra doses. Contact your doctor or health care professional if you miss more than one dose. What may interact with this medicine? Interactions are not expected. Do not use any other skin products without telling your doctor or health care professional. This list may not describe all possible interactions. Give your health care provider a list of all the medicines, herbs, non-prescription drugs, or dietary supplements you use. Also tell them if you smoke, drink alcohol, or use illegal drugs. Some items may interact with your medicine. What should I watch for while using this medicine? Visit your doctor or health care professional for checks on your progress. You will need to use this medicine for 2 to 6 weeks. This may be longer depending on the condition being treated. You may not see full healing for another 1 to 2 months after you stop using the medicine. Treated areas of skin can look unsightly during and for several weeks after treatment with this medicine. This medicine can make you more sensitive to the sun. Keep out of the sun. If you cannot avoid being in   the sun, wear protective clothing and use sunscreen. Do not use sun lamps or tanning beds/booths. Where should I keep my What side effects may I notice from receiving this medicine? Side effects that you should report to your doctor or health care professional as soon as possible: -allergic  reactions like skin rash, itching or hives, swelling of the face, lips, or tongue -black or bloody stools, blood in the urine or vomit -blurred vision -chest pain -difficulty breathing or wheezing -redness, blistering, peeling or loosening of the skin, including inside the mouth -severe redness and swelling of normal skin -slurred speech or weakness on one side of the body -trouble passing urine or change in the amount of urine -unexplained weight gain or swelling -unusually weak or tired -yellowing of eyes or skin Side effects that usually do not require medical attention (Report these to your doctor or health care professional if they continue or are bothersome.): -increased sensitivity of the skin to sun and ultraviolet light -pain and burning of the affected area -scaling or swelling of the affected area -skin rash, itching of the affected area -tenderness This list may not describe all possible side effects. Call your doctor for medical advice about side effects. You may report side effects to FDA at 1-800-FDA-1088. Where should I keep my medicine? Keep out of the reach of children. Store at room temperature between 20 and 25 degrees C (68 and 77 degrees F). Throw away any unused medicine after the expiration date. NOTE: This sheet is a summary. It may not cover all possible information. If you have questions about this medicine, talk to your doctor, pharmacist, or health care provider.  2015, [employer]vier/Gold Standard. (2013-11-21 11:09:58)  

## 2014-11-08 NOTE — ED Notes (Signed)
Pt leaving [facility]

## 2014-11-08 NOTE — ED Provider Notes (Signed)
CSN: [id number]     Arrival date & time 11/08/14  1101 History   First MD Initiated Contact with Patient 11/08/14 1144     Chief Complaint  Patient presents with  . Hypoglycemia     (Consider location/radiation/quality/duration/timing/severity/associated sxs/prior Treatment) HPI  Pt presnts due to hypoglycemia.  EMs was called and they state that cbg was 42, they provided instant glucose packs and it increased only to 46.  Pt states she has been gradually reducing her lantus insulin dosing per her doctor's orders.  She started several days ago- yesterday took 22 units- down from 27 units.  She denies recent illness. No fever/chills, no vomiting or diarrhea.  She does state she has not been eating very well and that her sugars run low in the mornings usually.  She also tells me that she does not wish to stay for further evaluation as she has an appointment at the cancer center at 1:30pm.  Per nursing notes she told her father that she used crack last night.  Pt eating sandwich and drinking ginger ale on my assessment.  There are no other associated systemic symptoms, there are no other alleviating or modifying factors.   Past Medical History  Diagnosis Date  . Diabetes mellitus   . Bipolar disorder   . Depression   . Hepatitis C   . Psychotic episode   . CORNEAL ABRASION, RIGHT 08/20/2010    Qualifier: Diagnosis of  By: Hassell Done FNP, Tori Milks    . Complication of anesthesia     Difficulty putting to sllep- done at [facility]  . Anxiety   . IBS (irritable bowel syndrome)   . PONV (postoperative nausea and vomiting)   . Subcutaneous nodule of anterior left knee 05/01/2013  . Dysmenorrhea 09/05/2013  . Leukopenia due to antineoplastic chemotherapy 09/28/2013  . Thrombocytopenia due to drugs 09/28/2013  . Neuropathy due to secondary diabetes 10/12/2013  . Constipation 04/20/2014  . Colon cancer 07/22/2013   Past Surgical History  Procedure Laterality Date  . Cesarean section    . Colonoscopy N/A  07/19/2013    Procedure: COLONOSCOPY;  Surgeon: Beryle Beams, MD;  Location: [place];  Service: Endoscopy;  Laterality: N/A;  . Partial colectomy Right 07/20/2013    Procedure: PARTIAL COLECTOMY;  Surgeon: Zenovia Jarred, MD;  Location: [place];  Service: General;  Laterality: Right;  . Cholecystectomy N/A 07/20/2013    Procedure: CHOLECYSTECTOMY;  Surgeon: Zenovia Jarred, MD;  Location: [place];  Service: General;  Laterality: N/A;  . Liver surgery      small amt os scar tissue removed with colon surgery.  . Portacath placement Left 08/12/2013    Procedure: INSERTION PORT-A-CATH;  Surgeon: Zenovia Jarred, MD;  Location: [place];  Service: General;  Laterality: Left;   No family history on file. History  Substance Use Topics  . Smoking status: Current Every Day Smoker -- 0.50 packs/day for 20 years    Types: Cigarettes  . Smokeless tobacco: Never Used     Comment: smokes 6 cig/daily. uses the patch  . Alcohol Use: No   OB History    No data available     Review of Systems  ROS reviewed and all otherwise negative except for mentioned in HPI    Allergies  Naproxen  Home Medications   Prior to Admission medications   Medication Sig Start Date End Date Taking? Authorizing Provider  atorvastatin (LIPITOR) 40 MG tablet Take 1 tablet (40 mg total) by mouth daily. 08/27/14  Yes Elberta Leatherwood, MD  Buprenorphine HCl-Naloxone HCl (SUBOXONE SL) Place 8 mg under the tongue 3 (three) times daily.   Yes Historical Provider, MD  clonazePAM (KLONOPIN) 1 MG tablet Take 1 mg by mouth 3 (three) times daily.    Yes Historical Provider, MD  diazepam (VALIUM) 10 MG tablet Take 10 mg by mouth every 8 (eight) hours as needed for anxiety.   Yes Historical Provider, MD  furosemide (LASIX) 40 MG tablet Take 1 tablet (40 mg total) by mouth 2 (two) times daily. 09/28/12  Yes Angelica Ran, MD  gabapentin (NEURONTIN) 800 MG tablet Take 800 mg by mouth 3 (three) times daily.   Yes Historical  Provider, MD  glucose blood (ACCU-CHEK AVIVA PLUS) test strip USE AS DIRECTED 07/24/14  Yes Elberta Leatherwood, MD  hydrOXYzine (ATARAX/VISTARIL) 50 MG tablet Take 25 mg by mouth 3 (three) times daily.   Yes Historical Provider, MD  insulin lispro (HUMALOG) 100 UNIT/ML injection Inject 1-10 Units into the skin 3 (three) times daily before meals.   Yes Historical Provider, MD  lamoTRIgine (LAMICTAL) 100 MG tablet Take 200 mg by mouth every morning.    Yes Historical Provider, MD  lidocaine-prilocaine (EMLA) cream Apply 1 application topically as needed (for pain). 08/05/13  Yes Heath Lark, MD  omeprazole (PRILOSEC) 20 MG capsule Take 20 mg by mouth every morning.   Yes Historical Provider, MD  polyethylene glycol powder (GLYCOLAX/MIRALAX) powder Take 17 g by mouth 2 (two) times daily as needed. Patient taking differently: Take 17 g by mouth 2 (two) times daily as needed for mild constipation.  04/05/14  Yes Elberta Leatherwood, MD  ROZEREM 8 MG tablet Take 8 mg by mouth at bedtime. 06/06/14  Yes Historical Provider, MD  ziprasidone (GEODON) 80 MG capsule Take 160 mg by mouth at bedtime.    Yes Historical Provider, MD  HYDROmorphone (DILAUDID) 4 MG tablet Take 1 tablet (4 mg total) by mouth 3 (three) times daily as needed for severe pain. 09/08/14   Heath Lark, MD  LANTUS 100 UNIT/ML injection INJECT 27 UNITS into SKIN EVERY DAY 11/09/14   Elberta Leatherwood, MD  senna-docusate (SENOKOT-S) 8.6-50 MG per tablet Take 1 tablet by mouth 2 (two) times daily. 04/20/14   Ni Gorsuch, MD   BP 142/83 mmHg  Pulse 68  Temp(Src) 98 F (36.7 C) (Oral)  Resp 22  SpO2 100%  Vitals reviewed Physical Exam  Physical Examination: General appearance - alert, well appearing, and in no distress Mental status - alert, oriented to person, place, and time Eyes - no conjunctival injection, no scleral icterus Mouth - mucous membranes moist, pharynx normal without lesions Chest - clear to auscultation, no wheezes, rales or rhonchi, symmetric air  entry Heart - normal rate, regular rhythm, normal S1, S2, no murmurs, rubs, clicks or gallops Abdomen - soft, nontender, nondistended, no masses or organomegaly Extremities - peripheral pulses normal, no pedal edema, no clubbing or cyanosis Skin - normal coloration and turgor, no rashes  ED Course  Procedures (including critical care time) 12:49 PM after I evaluated patient and tried to convince her to stay, she then took 20 units lantus insulin- which she states is her reduced daily dose- we will recheck cbg at 1pm.   Labs Review Labs Reviewed  CBC - Abnormal; Notable for the following:    Platelets 121 (*)    All other components within normal limits  BASIC METABOLIC PANEL - Abnormal; Notable for the following:  Glucose, Bld 201 (*)    Creatinine, Ser 1.11 (*)    GFR calc non Af Amer 56 (*)    GFR calc Af Amer 65 (*)    All other components within normal limits  CBG MONITORING, ED - Abnormal; Notable for the following:    Glucose-Capillary 110 (*)    All other components within normal limits    Imaging Review No results found.   EKG Interpretation None      MDM   Final diagnoses:  Hypoglycemia    12:27 PM upon my evaluation patient states that she needs to go to an oncology appointment at 1pm to get chemotherapy infusion pump removed.  She has nibbled at some food and had ginger ale.  Blood work has been drawn, i have encouraged her to stay for monitoring and at least for the basic blood work.  She agrees to stay for a little while, but is definitiely not wanting to stay for full several hour monitoring period.    Pt left the ED and signed out AMA prior to my being able to talk with her again.  She stated she was going over to cancer center for her appointment. Per nursing prior to leaving she had given herself her daily dose of lantus - she gave 20 units which was decreased from 22 units yesterday.  Nursing states that she had advised patient not to take this dose but  then patient gave herself the injection when she went back into the room.    Alfonzo Beers, MD 11/09/14 2049

## 2014-11-08 NOTE — ED Notes (Addendum)
Daughter and patient's father at bedside. Daughter who is a minor calls [employer] to the bedside to make patient stay. The family was made aware that we can't make patient stay. The Patient's father states she is not safe and she uses drugs. Per patient she told father that she used crack last night and he became mad. Pt denies SI or HI. Pt's family was asked to step to lobby due to making patient upset.

## 2014-11-08 NOTE — ED Notes (Signed)
Provided patient with sandwich and ginger ale.

## 2014-11-08 NOTE — ED Notes (Signed)
Pt checked her CBG with her meter and reports 267. She has opted to sign out AMA. Pt alert and oriented. Upon leaving department.

## 2014-11-08 NOTE — ED Notes (Signed)
Pt from home c/o hypoglycemia. PER EMS pt CBG was 42 initially and after 2 instant glucose packets and CBG came to 46. Pt currently alert and oriented but talking slow.

## 2014-11-08 NOTE — ED Notes (Addendum)
Pt took 20 units of insulin. MD Linker made aware. MD Linker reports to obtain a CBG at 1300. MD aware that patient may leave AMA.

## 2014-11-08 NOTE — ED Notes (Signed)
This RN called [facility] per patient's request to reports that she may be late to her 1300 appointment.

## 2014-11-16 ENCOUNTER — Telehealth: Payer: Self-pay | Admitting: Hematology and Oncology

## 2014-11-16 NOTE — Telephone Encounter (Signed)
returned call and s.w. pt and confirmed all appts....pt ok and aware °

## 2014-11-20 ENCOUNTER — Ambulatory Visit (HOSPITAL_BASED_OUTPATIENT_CLINIC_OR_DEPARTMENT_OTHER): Payer: Medicaid Other

## 2014-11-20 ENCOUNTER — Other Ambulatory Visit (HOSPITAL_BASED_OUTPATIENT_CLINIC_OR_DEPARTMENT_OTHER): Payer: Medicaid Other

## 2014-11-20 ENCOUNTER — Ambulatory Visit: Payer: Self-pay

## 2014-11-20 VITALS — BP 106/73 | HR 69 | Temp 98.3°F | Resp 18

## 2014-11-20 DIAGNOSIS — C786 Secondary malignant neoplasm of retroperitoneum and peritoneum: Secondary | ICD-10-CM | POA: Diagnosis not present

## 2014-11-20 DIAGNOSIS — Z5112 Encounter for antineoplastic immunotherapy: Secondary | ICD-10-CM | POA: Diagnosis present

## 2014-11-20 DIAGNOSIS — Z95828 Presence of other vascular implants and grafts: Secondary | ICD-10-CM

## 2014-11-20 DIAGNOSIS — Z5111 Encounter for antineoplastic chemotherapy: Secondary | ICD-10-CM

## 2014-11-20 DIAGNOSIS — C787 Secondary malignant neoplasm of liver and intrahepatic bile duct: Secondary | ICD-10-CM

## 2014-11-20 DIAGNOSIS — C182 Malignant neoplasm of ascending colon: Secondary | ICD-10-CM

## 2014-11-20 DIAGNOSIS — C189 Malignant neoplasm of colon, unspecified: Secondary | ICD-10-CM

## 2014-11-20 LAB — CBC WITH DIFFERENTIAL/PLATELET
BASO%: 0.2 % (ref 0.0–2.0)
Basophils Absolute: 0 10*3/uL (ref 0.0–0.1)
EOS%: 0.8 % (ref 0.0–7.0)
Eosinophils Absolute: 0.1 10*3/uL (ref 0.0–0.5)
HCT: 37 % (ref 34.8–46.6)
HGB: 12.5 g/dL (ref 11.6–15.9)
LYMPH%: 10 % — ABNORMAL LOW (ref 14.0–49.7)
MCH: 29.6 pg (ref 25.1–34.0)
MCHC: 33.8 g/dL (ref 31.5–36.0)
MCV: 87.7 fL (ref 79.5–101.0)
MONO#: 0.9 10*3/uL (ref 0.1–0.9)
MONO%: 11 % (ref 0.0–14.0)
NEUT%: 78 % — ABNORMAL HIGH (ref 38.4–76.8)
NEUTROS ABS: 6.6 10*3/uL — AB (ref 1.5–6.5)
PLATELETS: 124 10*3/uL — AB (ref 145–400)
RBC: 4.22 10*6/uL (ref 3.70–5.45)
RDW: 14.9 % — AB (ref 11.2–14.5)
WBC: 8.5 10*3/uL (ref 3.9–10.3)
lymph#: 0.9 10*3/uL (ref 0.9–3.3)

## 2014-11-20 LAB — COMPREHENSIVE METABOLIC PANEL (CC13)
ALBUMIN: 3.2 g/dL — AB (ref 3.5–5.0)
ALK PHOS: 126 U/L (ref 40–150)
ALT: 20 U/L (ref 0–55)
AST: 27 U/L (ref 5–34)
Anion Gap: 12 mEq/L — ABNORMAL HIGH (ref 3–11)
BUN: 20.4 mg/dL (ref 7.0–26.0)
CO2: 26 mEq/L (ref 22–29)
CREATININE: 1.5 mg/dL — AB (ref 0.6–1.1)
Calcium: 9.5 mg/dL (ref 8.4–10.4)
Chloride: 97 mEq/L — ABNORMAL LOW (ref 98–109)
EGFR: 40 mL/min/{1.73_m2} — AB (ref >90)
GLUCOSE: 340 mg/dL — AB (ref 70–140)
POTASSIUM: 3.8 meq/L (ref 3.5–5.1)
Sodium: 135 mEq/L — ABNORMAL LOW (ref 136–145)
Total Bilirubin: 0.84 mg/dL (ref 0.20–1.20)
Total Protein: 7.5 g/dL (ref 6.4–8.3)

## 2014-11-20 MED ORDER — SODIUM CHLORIDE 0.9 % IV SOLN
5.0000 mg/kg | Freq: Once | INTRAVENOUS | Status: AC
  Administered 2014-11-20: 375 mg via INTRAVENOUS
  Filled 2014-11-20: qty 15

## 2014-11-20 MED ORDER — DEXTROSE 5 % IV SOLN
400.0000 mg/m2 | Freq: Once | INTRAVENOUS | Status: AC
  Administered 2014-11-20: 692 mg via INTRAVENOUS
  Filled 2014-11-20: qty 34.6

## 2014-11-20 MED ORDER — SODIUM CHLORIDE 0.9 % IV SOLN
2400.0000 mg/m2 | INTRAVENOUS | Status: DC
  Administered 2014-11-20: 4150 mg via INTRAVENOUS
  Filled 2014-11-20: qty 83

## 2014-11-20 MED ORDER — DEXTROSE 5 % IV SOLN: Freq: Once | INTRAVENOUS | Status: DC

## 2014-11-20 MED ORDER — SODIUM CHLORIDE 0.9 % IV SOLN
Freq: Once | INTRAVENOUS | Status: AC
  Administered 2014-11-20: 11:00:00 via INTRAVENOUS

## 2014-11-20 MED ORDER — SODIUM CHLORIDE 0.9 % IJ SOLN
10.0000 mL | INTRAMUSCULAR | Status: DC | PRN
  Administered 2014-11-20: 10 mL via INTRAVENOUS
  Filled 2014-11-20: qty 10

## 2014-11-20 MED ORDER — SODIUM CHLORIDE 0.9 % IV SOLN
Freq: Once | INTRAVENOUS | Status: AC
  Administered 2014-11-20: 11:00:00 via INTRAVENOUS
  Filled 2014-11-20: qty 4

## 2014-11-20 NOTE — Patient Instructions (Signed)
[facility] Discharge Instructions for Patients Receiving Chemotherapy  Today you received the following chemotherapy agents 71fu.leucovorin,oxaliplatin,avastin To help prevent nausea and vomiting after your treatment, we encourage you to take your nausea medication as prescribed.   If you develop nausea and vomiting that is not controlled by your nausea medication, call the clinic.   BELOW ARE SYMPTOMS THAT SHOULD BE REPORTED IMMEDIATELY:  *FEVER GREATER THAN 100.5 F  *CHILLS WITH OR WITHOUT FEVER  NAUSEA AND VOMITING THAT IS NOT CONTROLLED WITH YOUR NAUSEA MEDICATION  *UNUSUAL SHORTNESS OF BREATH  *UNUSUAL BRUISING OR BLEEDING  TENDERNESS IN MOUTH AND THROAT WITH OR WITHOUT PRESENCE OF ULCERS  *URINARY PROBLEMS  *BOWEL PROBLEMS  UNUSUAL RASH Items with * indicate a potential emergency and should be followed up as soon as possible.  Feel free to call the clinic you have any questions or concerns. The clinic phone number is [phone].  Please show the [place] at check-in to the Emergency Department and triage nurse.

## 2014-11-20 NOTE — Patient Instructions (Signed)

## 2014-11-21 LAB — CEA: CEA: 35.2 ng/mL — ABNORMAL HIGH (ref 0.0–5.0)

## 2014-11-22 ENCOUNTER — Telehealth: Payer: Self-pay | Admitting: Family Medicine

## 2014-11-22 ENCOUNTER — Ambulatory Visit (HOSPITAL_BASED_OUTPATIENT_CLINIC_OR_DEPARTMENT_OTHER): Payer: Medicaid Other

## 2014-11-22 VITALS — BP 124/74 | HR 85 | Temp 97.3°F

## 2014-11-22 DIAGNOSIS — C787 Secondary malignant neoplasm of liver and intrahepatic bile duct: Secondary | ICD-10-CM

## 2014-11-22 DIAGNOSIS — C189 Malignant neoplasm of colon, unspecified: Secondary | ICD-10-CM

## 2014-11-22 DIAGNOSIS — C786 Secondary malignant neoplasm of retroperitoneum and peritoneum: Secondary | ICD-10-CM

## 2014-11-22 DIAGNOSIS — Z452 Encounter for adjustment and management of vascular access device: Secondary | ICD-10-CM

## 2014-11-22 DIAGNOSIS — C182 Malignant neoplasm of ascending colon: Secondary | ICD-10-CM | POA: Diagnosis not present

## 2014-11-22 MED ORDER — HEPARIN SOD (PORK) LOCK FLUSH 100 UNIT/ML IV SOLN
500.0000 [IU] | Freq: Once | INTRAVENOUS | Status: AC | PRN
  Administered 2014-11-22: 500 [IU]
  Filled 2014-11-22: qty 5

## 2014-11-22 MED ORDER — SODIUM CHLORIDE 0.9 % IJ SOLN
10.0000 mL | INTRAMUSCULAR | Status: DC | PRN
  Administered 2014-11-22: 10 mL
  Filled 2014-11-22: qty 10

## 2014-11-22 NOTE — Telephone Encounter (Signed)
Monica [name] from [facility] called and wanted to report that the patients blood sugar was way over 300 and was told that the patient didn't take her medication. Also the patient has not been seen since 04/2014. jw

## 2014-11-22 NOTE — Telephone Encounter (Signed)
Patient needs to come in to be seen soon. If she has glucose levels that high I'd like that have her seen before the end of the week if possible. If patient is symptomatic then she should seek an evaluation at the nearest UC or ED  Thanks!

## 2014-11-23 ENCOUNTER — Telehealth: Payer: Self-pay | Admitting: Family Medicine

## 2014-11-23 NOTE — Telephone Encounter (Signed)
Ok, if patient is unwilling to make appt, and reports currently being asymptomatic then there isn't anything else we can do.  Thanks Asha for doing this.

## 2014-11-23 NOTE — Telephone Encounter (Signed)
Called patient and informed her of message from MD. At this time she is refusing an appointment. Urged patient to scheduled an appointment but she states she is doing fine and if she becomes symptomatic she will call back to schedule,. FYI to PCP.

## 2014-11-23 NOTE — Telephone Encounter (Signed)
Pt called because she wanted [place] to know that her pharmacy closes at 6 pm. Blima Rich

## 2014-11-24 ENCOUNTER — Other Ambulatory Visit: Payer: Self-pay | Admitting: Family Medicine

## 2014-11-24 ENCOUNTER — Telehealth: Payer: Self-pay | Admitting: *Deleted

## 2014-11-24 NOTE — Telephone Encounter (Signed)
VM message from patient regarding refill of test strips. Pt states she is not getting enough test strips filled. She uses 3-4 a day and gets enough for one strip a day.  Instructed pt to call back to her pcp for these refills as he is the one who manages her diabetes.  Pt. Voiced understanding and will call back to her pcp.

## 2014-11-24 NOTE — Telephone Encounter (Signed)
Pt called to say she's out of her test strips

## 2014-11-26 NOTE — Telephone Encounter (Signed)
Refills placed on day of call (4/22)

## 2014-11-30 ENCOUNTER — Encounter (HOSPITAL_COMMUNITY): Payer: Self-pay

## 2014-11-30 ENCOUNTER — Telehealth: Payer: Self-pay | Admitting: Family Medicine

## 2014-11-30 ENCOUNTER — Ambulatory Visit (HOSPITAL_COMMUNITY)
Admission: RE | Admit: 2014-11-30 | Discharge: 2014-11-30 | Disposition: A | Discharge status: Home / Self Care | Payer: Medicaid Other | Source: Ambulatory Visit | Attending: Hematology and Oncology | Admitting: Hematology and Oncology

## 2014-11-30 ENCOUNTER — Ambulatory Visit (HOSPITAL_COMMUNITY): Payer: Medicaid Other

## 2014-11-30 ENCOUNTER — Ambulatory Visit (HOSPITAL_BASED_OUTPATIENT_CLINIC_OR_DEPARTMENT_OTHER): Payer: Medicaid Other

## 2014-11-30 ENCOUNTER — Other Ambulatory Visit: Payer: Self-pay | Admitting: *Deleted

## 2014-11-30 DIAGNOSIS — Z79899 Other long term (current) drug therapy: Secondary | ICD-10-CM | POA: Insufficient documentation

## 2014-11-30 DIAGNOSIS — Z452 Encounter for adjustment and management of vascular access device: Secondary | ICD-10-CM

## 2014-11-30 DIAGNOSIS — C787 Secondary malignant neoplasm of liver and intrahepatic bile duct: Secondary | ICD-10-CM | POA: Diagnosis not present

## 2014-11-30 DIAGNOSIS — C786 Secondary malignant neoplasm of retroperitoneum and peritoneum: Secondary | ICD-10-CM | POA: Diagnosis not present

## 2014-11-30 DIAGNOSIS — C189 Malignant neoplasm of colon, unspecified: Secondary | ICD-10-CM | POA: Diagnosis not present

## 2014-11-30 DIAGNOSIS — Z95828 Presence of other vascular implants and grafts: Secondary | ICD-10-CM

## 2014-11-30 DIAGNOSIS — C182 Malignant neoplasm of ascending colon: Secondary | ICD-10-CM

## 2014-11-30 MED ORDER — HEPARIN SOD (PORK) LOCK FLUSH 100 UNIT/ML IV SOLN
500.0000 [IU] | Freq: Once | INTRAVENOUS | Status: AC
  Administered 2014-11-30: 500 [IU] via INTRAVENOUS
  Filled 2014-11-30: qty 5

## 2014-11-30 MED ORDER — IOHEXOL 300 MG/ML  SOLN
80.0000 mL | Freq: Once | INTRAMUSCULAR | Status: AC | PRN
  Administered 2014-11-30: 80 mL via INTRAVENOUS

## 2014-11-30 MED ORDER — SODIUM CHLORIDE 0.9 % IJ SOLN
10.0000 mL | INTRAMUSCULAR | Status: DC | PRN
  Administered 2014-11-30: 10 mL via INTRAVENOUS
  Filled 2014-11-30: qty 10

## 2014-11-30 NOTE — Patient Instructions (Signed)

## 2014-12-01 ENCOUNTER — Ambulatory Visit: Payer: Self-pay | Admitting: Family Medicine

## 2014-12-04 ENCOUNTER — Ambulatory Visit (HOSPITAL_BASED_OUTPATIENT_CLINIC_OR_DEPARTMENT_OTHER): Payer: Medicaid Other

## 2014-12-04 ENCOUNTER — Other Ambulatory Visit: Payer: Self-pay | Admitting: Hematology and Oncology

## 2014-12-04 ENCOUNTER — Encounter: Payer: Self-pay | Admitting: Hematology and Oncology

## 2014-12-04 ENCOUNTER — Other Ambulatory Visit (HOSPITAL_BASED_OUTPATIENT_CLINIC_OR_DEPARTMENT_OTHER): Payer: Medicaid Other

## 2014-12-04 ENCOUNTER — Telehealth: Payer: Self-pay | Admitting: Hematology and Oncology

## 2014-12-04 ENCOUNTER — Ambulatory Visit: Payer: Medicaid Other

## 2014-12-04 ENCOUNTER — Ambulatory Visit (HOSPITAL_BASED_OUTPATIENT_CLINIC_OR_DEPARTMENT_OTHER): Payer: Medicaid Other | Admitting: Hematology and Oncology

## 2014-12-04 VITALS — BP 155/77 | HR 77 | Temp 98.2°F | Resp 16 | Ht 66.0 in | Wt 152.0 lb

## 2014-12-04 DIAGNOSIS — N183 Chronic kidney disease, stage 3 unspecified: Secondary | ICD-10-CM

## 2014-12-04 DIAGNOSIS — C189 Malignant neoplasm of colon, unspecified: Secondary | ICD-10-CM

## 2014-12-04 DIAGNOSIS — C786 Secondary malignant neoplasm of retroperitoneum and peritoneum: Secondary | ICD-10-CM

## 2014-12-04 DIAGNOSIS — C182 Malignant neoplasm of ascending colon: Secondary | ICD-10-CM

## 2014-12-04 DIAGNOSIS — G8929 Other chronic pain: Secondary | ICD-10-CM | POA: Diagnosis not present

## 2014-12-04 DIAGNOSIS — C787 Secondary malignant neoplasm of liver and intrahepatic bile duct: Secondary | ICD-10-CM | POA: Diagnosis not present

## 2014-12-04 DIAGNOSIS — G893 Neoplasm related pain (acute) (chronic): Secondary | ICD-10-CM

## 2014-12-04 DIAGNOSIS — Z95828 Presence of other vascular implants and grafts: Secondary | ICD-10-CM

## 2014-12-04 DIAGNOSIS — Z5111 Encounter for antineoplastic chemotherapy: Secondary | ICD-10-CM | POA: Diagnosis not present

## 2014-12-04 DIAGNOSIS — G894 Chronic pain syndrome: Secondary | ICD-10-CM

## 2014-12-04 DIAGNOSIS — Z5112 Encounter for antineoplastic immunotherapy: Secondary | ICD-10-CM | POA: Diagnosis not present

## 2014-12-04 DIAGNOSIS — K5909 Other constipation: Secondary | ICD-10-CM | POA: Diagnosis not present

## 2014-12-04 LAB — CBC WITH DIFFERENTIAL/PLATELET
BASO%: 0.3 % (ref 0.0–2.0)
Basophils Absolute: 0 10*3/uL (ref 0.0–0.1)
EOS ABS: 0 10*3/uL (ref 0.0–0.5)
EOS%: 0.4 % (ref 0.0–7.0)
HCT: 39 % (ref 34.8–46.6)
HEMOGLOBIN: 12.7 g/dL (ref 11.6–15.9)
LYMPH%: 10.2 % — ABNORMAL LOW (ref 14.0–49.7)
MCH: 28.3 pg (ref 25.1–34.0)
MCHC: 32.5 g/dL (ref 31.5–36.0)
MCV: 87.1 fL (ref 79.5–101.0)
MONO#: 0.7 10*3/uL (ref 0.1–0.9)
MONO%: 10.2 % (ref 0.0–14.0)
NEUT#: 5.5 10*3/uL (ref 1.5–6.5)
NEUT%: 78.9 % — AB (ref 38.4–76.8)
PLATELETS: 149 10*3/uL (ref 145–400)
RBC: 4.48 10*6/uL (ref 3.70–5.45)
RDW: 15.6 % — AB (ref 11.2–14.5)
WBC: 6.9 10*3/uL (ref 3.9–10.3)
lymph#: 0.7 10*3/uL — ABNORMAL LOW (ref 0.9–3.3)

## 2014-12-04 LAB — COMPREHENSIVE METABOLIC PANEL (CC13)
ALBUMIN: 3.3 g/dL — AB (ref 3.5–5.0)
ALT: 18 U/L (ref 0–55)
AST: 30 U/L (ref 5–34)
Alkaline Phosphatase: 121 U/L (ref 40–150)
Anion Gap: 14 mEq/L — ABNORMAL HIGH (ref 3–11)
BILIRUBIN TOTAL: 0.72 mg/dL (ref 0.20–1.20)
BUN: 12.5 mg/dL (ref 7.0–26.0)
CALCIUM: 9.7 mg/dL (ref 8.4–10.4)
CHLORIDE: 100 meq/L (ref 98–109)
CO2: 28 mEq/L (ref 22–29)
Creatinine: 1.2 mg/dL — ABNORMAL HIGH (ref 0.6–1.1)
EGFR: 53 mL/min/{1.73_m2} — ABNORMAL LOW (ref >90)
Glucose: 75 mg/dl (ref 70–140)
Potassium: 3.6 mEq/L (ref 3.5–5.1)
Sodium: 142 mEq/L (ref 136–145)
Total Protein: 7.6 g/dL (ref 6.4–8.3)

## 2014-12-04 LAB — UA PROTEIN, DIPSTICK - CHCC: PROTEIN: NEGATIVE mg/dL

## 2014-12-04 MED ORDER — SODIUM CHLORIDE 0.9 % IV SOLN
10.0000 mg | Freq: Once | INTRAVENOUS | Status: AC
  Administered 2014-12-04: 10 mg via INTRAVENOUS
  Filled 2014-12-04: qty 1

## 2014-12-04 MED ORDER — SODIUM CHLORIDE 0.9 % IV SOLN
2400.0000 mg/m2 | INTRAVENOUS | Status: DC
  Administered 2014-12-04: 4150 mg via INTRAVENOUS
  Filled 2014-12-04: qty 83

## 2014-12-04 MED ORDER — SODIUM CHLORIDE 0.9 % IV SOLN
Freq: Once | INTRAVENOUS | Status: AC
  Administered 2014-12-04: 11:00:00 via INTRAVENOUS

## 2014-12-04 MED ORDER — SODIUM CHLORIDE 0.9 % IJ SOLN
10.0000 mL | INTRAMUSCULAR | Status: DC | PRN
  Filled 2014-12-04: qty 10

## 2014-12-04 MED ORDER — HEPARIN SOD (PORK) LOCK FLUSH 100 UNIT/ML IV SOLN
500.0000 [IU] | Freq: Once | INTRAVENOUS | Status: DC | PRN
  Filled 2014-12-04: qty 5

## 2014-12-04 MED ORDER — SODIUM CHLORIDE 0.9 % IJ SOLN
10.0000 mL | INTRAMUSCULAR | Status: DC | PRN
  Administered 2014-12-04: 10 mL via INTRAVENOUS
  Filled 2014-12-04: qty 10

## 2014-12-04 MED ORDER — DEXTROSE 5 % IV SOLN
Freq: Once | INTRAVENOUS | Status: AC
  Administered 2014-12-04: 13:00:00 via INTRAVENOUS

## 2014-12-04 MED ORDER — BEVACIZUMAB CHEMO INJECTION 400 MG/16ML
5.0000 mg/kg | Freq: Once | INTRAVENOUS | Status: AC
  Administered 2014-12-04: 375 mg via INTRAVENOUS
  Filled 2014-12-04: qty 15

## 2014-12-04 MED ORDER — ONDANSETRON HCL 40 MG/20ML IJ SOLN
Freq: Once | INTRAMUSCULAR | Status: AC
  Administered 2014-12-04: 11:00:00 via INTRAVENOUS
  Filled 2014-12-04: qty 4

## 2014-12-04 MED ORDER — OXALIPLATIN CHEMO INJECTION 100 MG/20ML
42.5000 mg/m2 | Freq: Once | INTRAVENOUS | Status: AC
  Administered 2014-12-04: 75 mg via INTRAVENOUS
  Filled 2014-12-04: qty 15

## 2014-12-04 MED ORDER — LEUCOVORIN CALCIUM INJECTION 350 MG
405.0000 mg/m2 | Freq: Once | INTRAVENOUS | Status: AC
  Administered 2014-12-04: 700 mg via INTRAVENOUS
  Filled 2014-12-04: qty 35

## 2014-12-04 NOTE — Telephone Encounter (Signed)
per pof to sch pt appt-sent email to MW to get trmt sch-pt to get updated copy of schb4 leaving trmtroom

## 2014-12-04 NOTE — Assessment & Plan Note (Signed)
She has chronic pain disorder and cancer related pain. The patient is also taking Suboxone. I reinforced narcotic refill policy with the patient. I do not think it makes sense for her to be on 2 different pain medicine and I would not prescribe Dilaudid anymore as she is on Suboxone. Hopefully, if the chemotherapy work, her pain requirement would be less.

## 2014-12-04 NOTE — Assessment & Plan Note (Signed)
Her kidney function tests is unaffected by recent chemotherapy. Continue close observation.

## 2014-12-04 NOTE — Assessment & Plan Note (Signed)
This is likely related to chronic opioid dependency and her cancer involving her colon. She is dependent on magnesium citrate and have struggle with constipation lately. Continue to same.

## 2014-12-04 NOTE — Patient Instructions (Signed)

## 2014-12-04 NOTE — Progress Notes (Signed)
[place] OFFICE PROGRESS NOTE  Patient Care Team: Elberta Leatherwood, MD as PCP - General Fleet Contras, MD as Consulting Physician (Nephrology) Leatrice Jewels Rayetta Pigg, MD as Consulting Physician (Psychiatry) Heath Lark, MD as Consulting Physician (Hematology and Oncology)  SUMMARY OF ONCOLOGIC HISTORY: Oncology History   Colon cancer, KRAS positive   Primary site: Colon and Rectum (Right)   Staging method: AJCC 7th Edition   Clinical: (T4b, N2b, M1) signed by Heath Lark, MD on 08/05/2013  9:52 AM   Pathologic: Stage IVB (T4b, N2b, M1b) signed by Heath Lark, MD on 08/05/2013 10:21 AM   Summary: Stage IVB (T4b, N2b, M1b)      Colon cancer   07/15/2013 - 07/25/2013 Hospital Admission The patient presented to the hospital with pain in her abdomen and underwent surgery for colon cancer   07/15/2013 Imaging CT scan of the abdomen show bowel dilatation suspicious for malignancy   07/17/2013 Tumor Marker Preoperative CEA was 2.2, normal   07/19/2013 Procedure Colonoscopy revealed ascending colon lesion with significant stricture and biopsy showed adenocarcinoma   07/20/2013 Surgery She underwent right hemicolectomy, cholecystectomy, liver biopsy, excision of abdominal wall mass and omental mass. During surgery, she was also found to have drop metastasis   08/29/2013 Imaging Repeat CT scan show persistent liver metastasis and new omental metastasis   08/31/2013 - 02/10/2014 Chemotherapy The patient was started on palliative chemotherapy with FOLFOX, bolus 5-FU committed and oxaliplatin dose reduced due to  peripheral neuropathy. She has completed 12 cycles of treatment.   11/22/2013 Imaging Repeat CT scan showed near complete response to treatment.   11/23/2013 - 02/10/2014 Chemotherapy Avastin was added to her chemotherapy regimen   12/21/2013 Adverse Reaction Oxaliplatin was omitted due to worsening peripheral neuropathy.   02/20/2014 Imaging Repeat CT scan showed near complete resolution  of all disease.   05/15/2014 Imaging Repeat CT scan showed peritoneal metastasis and recurrence of disease   05/22/2014 -  Chemotherapy Modify dosings of FOLFOX was restarted.   07/14/2014 Imaging CT scan of the abdomen and pelvis show positive response to treatment.   09/07/2014 Imaging Repeat CT scan of the chest, abdomen and pelvis shows stable disease.   09/08/2014 Adverse Reaction She will continue treatment without oxaliplatin due to severe peripheral neuropathy.   11/30/2014 Imaging Ct scan showed significant disease progression    INTERVAL HISTORY: Please see below for problem oriented charting. She returns for further follow-up and review imaging study. She complained of intermittent cramping in her lower abdomen, along with chronic constipation. She denies recurrent episodes of hypoglycemia. She denies worsening peripheral neuropathy.  REVIEW OF SYSTEMS:   Constitutional: Denies fevers, chills or abnormal weight loss Eyes: Denies blurriness of vision Ears, nose, mouth, throat, and face: Denies mucositis or sore throat Respiratory: Denies cough, dyspnea or wheezes Cardiovascular: Denies palpitation, chest discomfort or lower extremity swelling Skin: Denies abnormal skin rashes Lymphatics: Denies new lymphadenopathy or easy bruising Neurological:Denies numbness, tingling or new weaknesses Behavioral/Psych: Mood is stable, no new changes  All other systems were reviewed with the patient and are negative.  I have reviewed the past medical history, past surgical history, social history and family history with the patient and they are unchanged from previous note.  ALLERGIES:  is allergic to naproxen.  MEDICATIONS:  Current Outpatient Prescriptions  Medication Sig Dispense Refill  . ACCU-CHEK AVIVA PLUS test strip USE TO CHECK BLOOD SUGAR EVERY DAY AS DIRECTED 100 each 10  . atorvastatin (LIPITOR) 40 MG tablet Take 1 tablet (  40 mg total) by mouth daily. 30 tablet 5  .  Buprenorphine HCl-Naloxone HCl (SUBOXONE SL) Place 8 mg under the tongue 3 (three) times daily.    . clonazePAM (KLONOPIN) 1 MG tablet Take 1 mg by mouth 3 (three) times daily.     . diazepam (VALIUM) 10 MG tablet Take 10 mg by mouth every 8 (eight) hours as needed for anxiety.    . furosemide (LASIX) 40 MG tablet Take 1 tablet (40 mg total) by mouth 2 (two) times daily. 60 tablet 5  . gabapentin (NEURONTIN) 800 MG tablet Take 800 mg by mouth 3 (three) times daily.    Marland Kitchen HYDROmorphone (DILAUDID) 4 MG tablet Take 1 tablet (4 mg total) by mouth 3 (three) times daily as needed for severe pain. 90 tablet 0  . hydrOXYzine (ATARAX/VISTARIL) 50 MG tablet Take 25 mg by mouth 3 (three) times daily.    . insulin lispro (HUMALOG) 100 UNIT/ML injection Inject 1-10 Units into the skin 3 (three) times daily before meals.    . lamoTRIgine (LAMICTAL) 100 MG tablet Take 200 mg by mouth every morning.     Marland Kitchen LANTUS 100 UNIT/ML injection INJECT 27 UNITS into SKIN EVERY DAY 10 mL 3  . lidocaine-prilocaine (EMLA) cream Apply 1 application topically as needed (for pain).    Marland Kitchen omeprazole (PRILOSEC) 20 MG capsule Take 20 mg by mouth every morning.    . polyethylene glycol powder (GLYCOLAX/MIRALAX) powder Take 17 g by mouth 2 (two) times daily as needed. (Patient taking differently: Take 17 g by mouth 2 (two) times daily as needed for mild constipation. ) 3350 g 3  . ROZEREM 8 MG tablet Take 8 mg by mouth at bedtime.  0  . senna-docusate (SENOKOT-S) 8.6-50 MG per tablet Take 1 tablet by mouth 2 (two) times daily. 90 tablet 3  . ziprasidone (GEODON) 80 MG capsule Take 160 mg by mouth at bedtime.      No current facility-administered medications for this visit.   Facility-Administered Medications Ordered in Other Visits  Medication Dose Route Frequency Provider Last Rate Last Dose  . fluorouracil (ADRUCIL) 4,150 mg in sodium chloride 0.9 % 67 mL chemo infusion  2,400 mg/m2 (Treatment Plan Adjusted) Intravenous 1 day or 1  dose Heath Lark, MD   4,150 mg at 12/04/14 1523  . heparin lock flush 100 unit/mL  500 Units Intracatheter Once PRN Heath Lark, MD      . heparin lock flush 100 unit/mL  500 Units Intracatheter Once PRN Heath Lark, MD      . sodium chloride 0.9 % injection 10 mL  10 mL Intravenous PRN Heath Lark, MD   10 mL at 09/25/14 0913  . sodium chloride 0.9 % injection 10 mL  10 mL Intracatheter PRN Heath Lark, MD      . sodium chloride 0.9 % injection 10 mL  10 mL Intracatheter PRN Heath Lark, MD        PHYSICAL EXAMINATION: ECOG PERFORMANCE STATUS: 2 - Symptomatic, <50% confined to bed  Filed Vitals:   12/04/14 1019  BP: 155/77  Pulse: 77  Temp: 98.2 F (36.8 C)  Resp: 16   Filed Weights   12/04/14 1019  Weight: 152 lb (68.947 kg)    GENERAL:alert, no distress and comfortable. She appears weak and heavily sedated, could barely open her eyes when she interact with me SKIN: skin color, texture, turgor are normal, no rashes or significant lesions EYES: normal, Conjunctiva are pink and non-injected, sclera clear OROPHARYNX:no  exudate, no erythema and lips, buccal mucosa, and tongue normal  NECK: supple, thyroid normal size, non-tender, without nodularity LYMPH:  no palpable lymphadenopathy in the cervical, axillary or inguinal LUNGS: clear to auscultation and percussion with normal breathing effort HEART: regular rate & rhythm and no murmurs and no lower extremity edema ABDOMEN:abdomen soft, non-tender and normal bowel sounds Musculoskeletal:no cyanosis of digits and no clubbing  NEURO: alert & oriented x 3 with fluent speech, no focal motor/sensory deficits  LABORATORY DATA:  I have reviewed the data as listed    Component Value Date/Time   NA 142 12/04/2014 1001   NA 140 11/08/2014 1209   K 3.6 12/04/2014 1001   K 3.7 11/08/2014 1209   CL 98 11/08/2014 1209   CO2 28 12/04/2014 1001   CO2 29 11/08/2014 1209   GLUCOSE 75 12/04/2014 1001   GLUCOSE 201* 11/08/2014 1209   BUN 12.5  12/04/2014 1001   BUN 15 11/08/2014 1209   CREATININE 1.2* 12/04/2014 1001   CREATININE 1.11* 11/08/2014 1209   CREATININE 1.38* 05/26/2013 1703   CALCIUM 9.7 12/04/2014 1001   CALCIUM 9.1 11/08/2014 1209   PROT 7.6 12/04/2014 1001   PROT 7.1 05/14/2014 2233   ALBUMIN 3.3* 12/04/2014 1001   ALBUMIN 3.1* 05/14/2014 2233   AST 30 12/04/2014 1001   AST 28 05/14/2014 2233   ALT 18 12/04/2014 1001   ALT 18 05/14/2014 2233   ALKPHOS 121 12/04/2014 1001   ALKPHOS 124* 05/14/2014 2233   BILITOT 0.72 12/04/2014 1001   BILITOT 0.2* 05/14/2014 2233   GFRNONAA 56* 11/08/2014 1209   GFRNONAA 44* 05/26/2013 1703   GFRAA 65* 11/08/2014 1209   GFRAA 51* 05/26/2013 1703    No results found for: SPEP, UPEP  Lab Results  Component Value Date   WBC 6.9 12/04/2014   NEUTROABS 5.5 12/04/2014   HGB 12.7 12/04/2014   HCT 39.0 12/04/2014   MCV 87.1 12/04/2014   PLT 149 12/04/2014      Chemistry      Component Value Date/Time   NA 142 12/04/2014 1001   NA 140 11/08/2014 1209   K 3.6 12/04/2014 1001   K 3.7 11/08/2014 1209   CL 98 11/08/2014 1209   CO2 28 12/04/2014 1001   CO2 29 11/08/2014 1209   BUN 12.5 12/04/2014 1001   BUN 15 11/08/2014 1209   CREATININE 1.2* 12/04/2014 1001   CREATININE 1.11* 11/08/2014 1209   CREATININE 1.38* 05/26/2013 1703      Component Value Date/Time   CALCIUM 9.7 12/04/2014 1001   CALCIUM 9.1 11/08/2014 1209   ALKPHOS 121 12/04/2014 1001   ALKPHOS 124* 05/14/2014 2233   AST 30 12/04/2014 1001   AST 28 05/14/2014 2233   ALT 18 12/04/2014 1001   ALT 18 05/14/2014 2233   BILITOT 0.72 12/04/2014 1001   BILITOT 0.2* 05/14/2014 2233       RADIOGRAPHIC STUDIES: I have reviewed the CT scan studies. I have personally reviewed the radiological images as listed and agreed with the findings in the report.   ASSESSMENT & PLAN:  Colon cancer The patient had excellent treatment to FOLFOX plus Avastin in the past. However, oxaliplatin has to be  discontinued due to severe peripheral neuropathy. Unfortunately, repeat imaging studies show disease progression. I recommend we add oxaliplatin to his treatment plan. I plan to repeat imaging studies again in 2 months. If she has no response to treatment, we may have to switch her to FOLFIRI and Aflibercept. We also  discussed prognosis which is poor. I will get help from social worker to assist in advanced directive planning. The patient desired further treatment.   Chronic pain disorder She has chronic pain disorder and cancer related pain. The patient is also taking Suboxone. I reinforced narcotic refill policy with the patient. I do not think it makes sense for her to be on 2 different pain medicine and I would not prescribe Dilaudid anymore as she is on Suboxone. Hopefully, if the chemotherapy work, her pain requirement would be less.     CKD (chronic kidney disease), stage III Her kidney function tests is unaffected by recent chemotherapy. Continue close observation.     Other constipation This is likely related to chronic opioid dependency and her cancer involving her colon. She is dependent on magnesium citrate and have struggle with constipation lately. Continue to same.    No orders of the defined types were placed in this encounter.   All questions were answered. The patient knows to call the clinic with any problems, questions or concerns. No barriers to learning was detected. I spent 30 minutes counseling the patient face to face. The total time spent in the appointment was 40 minutes and more than 50% was on counseling and review of test results     [facility], Eldana Isip, MD 12/04/2014 4:47 PM

## 2014-12-04 NOTE — Assessment & Plan Note (Addendum)
The patient had excellent treatment to FOLFOX plus Avastin in the past. However, oxaliplatin has to be discontinued due to severe peripheral neuropathy. Unfortunately, repeat imaging studies show disease progression. I recommend we add oxaliplatin to his treatment plan. I plan to repeat imaging studies again in 2 months. If she has no response to treatment, we may have to switch her to FOLFIRI and Aflibercept. We also discussed prognosis which is poor. I will get help from social worker to assist in advanced directive planning. The patient desired further treatment.

## 2014-12-05 LAB — CEA: CEA: 22 ng/mL — AB (ref 0.0–5.0)

## 2014-12-06 ENCOUNTER — Ambulatory Visit (HOSPITAL_BASED_OUTPATIENT_CLINIC_OR_DEPARTMENT_OTHER): Payer: Medicaid Other

## 2014-12-06 VITALS — BP 127/81 | HR 45 | Temp 97.9°F

## 2014-12-06 DIAGNOSIS — C189 Malignant neoplasm of colon, unspecified: Secondary | ICD-10-CM

## 2014-12-06 DIAGNOSIS — C786 Secondary malignant neoplasm of retroperitoneum and peritoneum: Secondary | ICD-10-CM | POA: Diagnosis not present

## 2014-12-06 DIAGNOSIS — C182 Malignant neoplasm of ascending colon: Secondary | ICD-10-CM

## 2014-12-06 DIAGNOSIS — C787 Secondary malignant neoplasm of liver and intrahepatic bile duct: Secondary | ICD-10-CM

## 2014-12-06 MED ORDER — SODIUM CHLORIDE 0.9 % IJ SOLN
10.0000 mL | INTRAMUSCULAR | Status: DC | PRN
  Administered 2014-12-06: 10 mL
  Filled 2014-12-06: qty 10

## 2014-12-06 MED ORDER — HEPARIN SOD (PORK) LOCK FLUSH 100 UNIT/ML IV SOLN
500.0000 [IU] | Freq: Once | INTRAVENOUS | Status: AC | PRN
  Administered 2014-12-06: 500 [IU]
  Filled 2014-12-06: qty 5

## 2014-12-06 NOTE — Patient Instructions (Signed)
Fluorouracil, 5-FU injection What is this medicine? FLUOROURACIL, 5-FU (flure oh YOOR a sil) is a chemotherapy drug. It slows the growth of cancer cells. This medicine is used to treat many types of cancer like breast cancer, colon or rectal cancer, pancreatic cancer, and stomach cancer. This medicine may be used for other purposes; ask your health care provider or pharmacist if you have questions. COMMON BRAND NAME(S): Adrucil What should I tell my health care provider before I take this medicine? They need to know if you have any of these conditions: -blood disorders -dihydropyrimidine dehydrogenase (DPD) deficiency -infection (especially a virus infection such as chickenpox, cold sores, or herpes) -kidney disease -liver disease -malnourished, poor nutrition -recent or ongoing radiation therapy -an unusual or allergic reaction to fluorouracil, other chemotherapy, other medicines, foods, dyes, or preservatives -pregnant or trying to get pregnant -breast-feeding How should I use this medicine? This drug is given as an infusion or injection into a vein. It is administered in a hospital or clinic by a specially trained health care professional. Talk to your pediatrician regarding the use of this medicine in children. Special care may be needed. Overdosage: If you think you have taken too much of this medicine contact a poison control center or emergency room at once. NOTE: This medicine is only for you. Do not share this medicine with others. What if I miss a dose? It is important not to miss your dose. Call your doctor or health care professional if you are unable to keep an appointment. What may interact with this medicine? -allopurinol -cimetidine -dapsone -digoxin -hydroxyurea -leucovorin -levamisole -medicines for seizures like ethotoin, fosphenytoin, phenytoin -medicines to increase blood counts like filgrastim, pegfilgrastim, sargramostim -medicines that treat or prevent blood  clots like warfarin, enoxaparin, and dalteparin -methotrexate -metronidazole -pyrimethamine -some other chemotherapy drugs like busulfan, cisplatin, estramustine, vinblastine -trimethoprim -trimetrexate -vaccines Talk to your doctor or health care professional before taking any of these medicines: -acetaminophen -aspirin -ibuprofen -ketoprofen -naproxen This list may not describe all possible interactions. Give your health care provider a list of all the medicines, herbs, non-prescription drugs, or dietary supplements you use. Also tell them if you smoke, drink alcohol, or use illegal drugs. Some items may interact with your medicine. What should I watch for while using this medicine? Visit your doctor for checks on your progress. This drug may make you feel generally unwell. This is not uncommon, as chemotherapy can affect healthy cells as well as cancer cells. Report any side effects. Continue your course of treatment even though you feel ill unless your doctor tells you to stop. In some cases, you may be given additional medicines to help with side effects. Follow all directions for their use. Call your doctor or health care professional for advice if you get a fever, chills or sore throat, or other symptoms of a cold or flu. Do not treat yourself. This drug decreases your body's ability to fight infections. Try to avoid being around people who are sick. This medicine may increase your risk to bruise or bleed. Call your doctor or health care professional if you notice any unusual bleeding. Be careful brushing and flossing your teeth or using a toothpick because you may get an infection or bleed more easily. If you have any dental work done, tell your dentist you are receiving this medicine. Avoid taking products that contain aspirin, acetaminophen, ibuprofen, naproxen, or ketoprofen unless instructed by your doctor. These medicines may hide a fever. Do not become pregnant while taking this    medicine. Women should inform their doctor if they wish to become pregnant or think they might be pregnant. There is a potential for serious side effects to an unborn child. Talk to your health care professional or pharmacist for more information. Do not breast-feed an infant while taking this medicine. Men should inform their doctor if they wish to father a child. This medicine may lower sperm counts. Do not treat diarrhea with over the counter products. Contact your doctor if you have diarrhea that lasts more than 2 days or if it is severe and watery. This medicine can make you more sensitive to the sun. Keep out of the sun. If you cannot avoid being in the sun, wear protective clothing and use sunscreen. Do not use sun lamps or tanning beds/booths. What side effects may I notice from receiving this medicine? Side effects that you should report to your doctor or health care professional as soon as possible: -allergic reactions like skin rash, itching or hives, swelling of the face, lips, or tongue -low blood counts - this medicine may decrease the number of white blood cells, red blood cells and platelets. You may be at increased risk for infections and bleeding. -signs of infection - fever or chills, cough, sore throat, pain or difficulty passing urine -signs of decreased platelets or bleeding - bruising, pinpoint red spots on the skin, black, tarry stools, blood in the urine -signs of decreased red blood cells - unusually weak or tired, fainting spells, lightheadedness -breathing problems -changes in vision -chest pain -mouth sores -nausea and vomiting -pain, swelling, redness at site where injected -pain, tingling, numbness in the hands or feet -redness, swelling, or sores on hands or feet -stomach pain -unusual bleeding Side effects that usually do not require medical attention (report to your doctor or health care professional if they continue or are bothersome): -changes in finger or  toe nails -diarrhea -dry or itchy skin -hair loss -headache -loss of appetite -sensitivity of eyes to the light -stomach upset -unusually teary eyes This list may not describe all possible side effects. Call your doctor for medical advice about side effects. You may report side effects to FDA at 1-800-FDA-1088. Where should I keep my medicine? This drug is given in a hospital or clinic and will not be stored at home. NOTE: This sheet is a summary. It may not cover all possible information. If you have questions about this medicine, talk to your doctor, pharmacist, or health care provider.  2015, Elsevier/Gold Standard. (2007-11-24 13:53:16)   

## 2014-12-08 ENCOUNTER — Ambulatory Visit (INDEPENDENT_AMBULATORY_CARE_PROVIDER_SITE_OTHER): Payer: Medicaid Other | Admitting: Family Medicine

## 2014-12-08 ENCOUNTER — Encounter: Payer: Self-pay | Admitting: Family Medicine

## 2014-12-08 VITALS — BP 104/61 | HR 76 | Temp 98.1°F | Ht 65.0 in | Wt 153.4 lb

## 2014-12-08 DIAGNOSIS — F4321 Adjustment disorder with depressed mood: Secondary | ICD-10-CM

## 2014-12-08 DIAGNOSIS — Z634 Disappearance and death of family member: Secondary | ICD-10-CM

## 2014-12-08 DIAGNOSIS — E109 Type 1 diabetes mellitus without complications: Secondary | ICD-10-CM | POA: Diagnosis not present

## 2014-12-08 DIAGNOSIS — C189 Malignant neoplasm of colon, unspecified: Secondary | ICD-10-CM

## 2014-12-08 LAB — POCT GLYCOSYLATED HEMOGLOBIN (HGB A1C): HEMOGLOBIN A1C: 7.2

## 2014-12-08 MED ORDER — HYDROMORPHONE HCL 4 MG PO TABS: 4.0000 mg | ORAL_TABLET | ORAL | Status: DC | PRN

## 2014-12-08 MED ORDER — GLUCOSE BLOOD VI STRP: ORAL_STRIP | Status: DC

## 2014-12-08 NOTE — Progress Notes (Signed)
HPI  CC: DM type 1 check  # DM type 1:  Patient states good compliance with her medications.   Denies sxs of "lows"  Checks her BG daily (usually 3-4x/day) ROS: denies vision changes, confusion, lightheadedness, palpitations, n/v, polydipsia, polyuria  # Colon CA  Followed by oncology  Was told she has until end of this year to live.  She has not yet told her 53yo daughter about recent news of the metastasis to her liver and lung. ROS: positive for depression, abdom pain, myalgias.  # Bereavement/Depression  Her Mother passed away a couple months ago -- lung CA; this was pretty sudden and it seems like her mother didn't tell her how serious her disease was.  No SI/HI  Recently moved to [place] Lives in friends home w/ daughter. She seems to really like this place. Says its a "good place" w/ a smile.   She is worried about placement for her daughter after she passes -- she doesn't want her daughter to go to the father, she wants her daughter to stay w/ this friend of hers. ROS: no anxiety, SI/HI, hallucinations.   Review of Systems   See HPI for ROS. All other systems reviewed and are negative.  Past medical history and social history reviewed and updated in the EMR as appropriate.  Objective: BP 104/61 mmHg  Pulse 76  Temp(Src) 98.1 F (36.7 C) (Oral)  Ht 5\' 5"  (1.651 m)  Wt 153 lb 7 oz (69.599 kg)  BMI 25.53 kg/m2  LMP 12/05/2014 Gen: NAD, alert, cooperative, and flat affect. HEENT: NCAT, EOMI, PERRL CV: RRR, no murmur Resp: CTAB, non-labored Abd: generalized pain noted. Worse at RUQ/LUQ. Otherwise soft, not distended.  Ext: No edema, warm Neuro: Alert and oriented, Speech clear, No gross deficits, flat affect  Assessment and plan:  Diabetes mellitus type I Patient not experiencing any sxs of hypoglycemia at this time.  - I believe it is appropriate to hold course w/ this patient. I have no desire to work to reduce patient's A1c while her Cancer  prognosis is so poor. At this time I believe it is important to keep her from experiencing any hypoglycemic episodes or DKA which either could mean precious time spent in the hospital and away from her daughter. - No medication changes at this time.   Colon cancer Prognosis appears to be poor. Patient seems to have a realistic approach to this matter. Patient reports having a significant amount of pain today in her abdomen. She has asked that I take over her pain management. I have no issue with this but I informed her that I refuse to write for large narcotic prescriptions while the oncologist is doing the same. I let her know that if Dr. Alvy Bimler approved, I would be happy to take this over, but not until then. I did write a short prescription that I thought may last until her next appt w/ Dr. Alvy Bimler, and informed her this would be the only narcotic script I write until I receive word from him to take over.  -Dilaudid 4mg  x20tabs   Bereavement due to life event Patient w/ recent loss of her mother due to cancer. Patient also has Colon cancer. Prognosis appears to be poor. Patient seems to have a realistic approach to this matter. However, I believe she is becoming depressed. I have asked that she make 1-2 additional appointments w/ me to follow-up on this depression, with the first of these being 4 weeks from now. Patient  responded well to this request and I let her know that I'd like to discuss more about her mood and feelings about these tough times in the future.   Next:  - discuss end-of-life care - consider anti-depressant? - consider appointment w/ both mother and daughter present to ensure communication is appropriate for daughter to comprehend and prepare for.     Orders Placed This Encounter  Procedures  . Microalbumin/Creatinine Ratio, Urine  . POCT glycosylated hemoglobin (Hb A1C)    Meds ordered this encounter  Medications  . glucose blood (ACCU-CHEK AVIVA PLUS) test strip      Sig: USE TO CHECK BLOOD SUGAR EVERY DAY UP TO 4 TIMES.    Dispense:  100 each    Refill:  10  . HYDROmorphone (DILAUDID) 4 MG tablet    Sig: Take 1 tablet (4 mg total) by mouth every 4 (four) hours as needed for severe pain.    Dispense:  20 tablet    Refill:  0     Elberta Leatherwood, MD,MS,  PGY1 12/11/2014 9:22 PM

## 2014-12-08 NOTE — Patient Instructions (Signed)
It was a pleasure seeing you today in our clinic. Today we discussed your Diabetes. Here is the treatment plan we have discussed and agreed upon together:   - Your A1c is improved, keep up the great work - Things seem to be pretty tough right now. I'd like to see you back in 5-6wks for a follow-up so we can discuss your treatment course and progress of your cancer. - Please never hesitate to contact our office. I will try my best to make myself available for you if you need me.

## 2014-12-09 LAB — MICROALBUMIN / CREATININE URINE RATIO
CREATININE, URINE: 109.1 mg/dL
MICROALB UR: 0.8 mg/dL (ref <2.0)
Microalb Creat Ratio: 7.3 mg/g (ref 0.0–30.0)

## 2014-12-11 DIAGNOSIS — Z634 Disappearance and death of family member: Secondary | ICD-10-CM | POA: Insufficient documentation

## 2014-12-11 NOTE — Assessment & Plan Note (Signed)
Patient not experiencing any sxs of hypoglycemia at this time.  - I believe it is appropriate to hold course w/ this patient. I have no desire to work to reduce patient's A1c while her Cancer prognosis is so poor. At this time I believe it is important to keep her from experiencing any hypoglycemic episodes or DKA which either could mean precious time spent in the hospital and away from her daughter. - No medication changes at this time.

## 2014-12-11 NOTE — Assessment & Plan Note (Signed)
Prognosis appears to be poor. Patient seems to have a realistic approach to this matter. Patient reports having a significant amount of pain today in her abdomen. She has asked that I take over her pain management. I have no issue with this but I informed her that I refuse to write for large narcotic prescriptions while the oncologist is doing the same. I let her know that if Dr. Alvy Bimler approved, I would be happy to take this over, but not until then. I did write a short prescription that I thought may last until her next appt w/ Dr. Alvy Bimler, and informed her this would be the only narcotic script I write until I receive word from him to take over.  -Dilaudid 4mg  x20tabs

## 2014-12-11 NOTE — Assessment & Plan Note (Addendum)
Patient w/ recent loss of her mother due to cancer. Patient also has Colon cancer. Prognosis appears to be poor. Patient seems to have a realistic approach to this matter. However, I believe she is becoming depressed. I have asked that she make 1-2 additional appointments w/ me to follow-up on this depression, with the first of these being 4 weeks from now. Patient responded well to this request and I let her know that I'd like to discuss more about her mood and feelings about these tough times in the future.   Next:  - discuss end-of-life care - consider anti-depressant? - consider appointment w/ both mother and daughter present to ensure communication is appropriate for daughter to comprehend and prepare for.

## 2014-12-15 ENCOUNTER — Telehealth: Payer: Self-pay | Admitting: *Deleted

## 2014-12-15 NOTE — Telephone Encounter (Signed)
Called patient after voicemail received to "call her because she has questions about pain medications that another doctor can fill." "My Primary at [facility] has prescribed Dilaudid.  It doesn't work really but they've prescribed this in the past."  Reports she stopped the Suboxone today and hasn;t taken the dilaudid since last Friday (12-08-2014).  Will notify Dr. Alvy Bimler but Monica [name] has already filled and used the dilaudid 4 mg tablets.  Reports she has to be off the Suboxone 24 hours before using pain medicines.  Informed her side effects and that We need documentation from Dr. Franchot Mimes before proceeding with any pain medicines.  Return number [phone].  Monica [name] says she "will call Dr. Franchot Mimes on Tuesday when the office re-opens".

## 2014-12-18 ENCOUNTER — Other Ambulatory Visit (HOSPITAL_BASED_OUTPATIENT_CLINIC_OR_DEPARTMENT_OTHER): Payer: Medicaid Other

## 2014-12-18 ENCOUNTER — Other Ambulatory Visit: Payer: Self-pay | Admitting: *Deleted

## 2014-12-18 ENCOUNTER — Encounter: Payer: Self-pay | Admitting: *Deleted

## 2014-12-18 ENCOUNTER — Ambulatory Visit (HOSPITAL_BASED_OUTPATIENT_CLINIC_OR_DEPARTMENT_OTHER): Payer: Medicaid Other

## 2014-12-18 ENCOUNTER — Other Ambulatory Visit: Payer: Self-pay | Admitting: Hematology and Oncology

## 2014-12-18 ENCOUNTER — Ambulatory Visit: Payer: Medicaid Other

## 2014-12-18 VITALS — BP 122/74 | HR 63 | Temp 97.2°F | Resp 20

## 2014-12-18 DIAGNOSIS — C786 Secondary malignant neoplasm of retroperitoneum and peritoneum: Secondary | ICD-10-CM

## 2014-12-18 DIAGNOSIS — Z5111 Encounter for antineoplastic chemotherapy: Secondary | ICD-10-CM | POA: Diagnosis not present

## 2014-12-18 DIAGNOSIS — C787 Secondary malignant neoplasm of liver and intrahepatic bile duct: Secondary | ICD-10-CM

## 2014-12-18 DIAGNOSIS — C189 Malignant neoplasm of colon, unspecified: Secondary | ICD-10-CM

## 2014-12-18 DIAGNOSIS — C182 Malignant neoplasm of ascending colon: Secondary | ICD-10-CM

## 2014-12-18 DIAGNOSIS — Z5112 Encounter for antineoplastic immunotherapy: Secondary | ICD-10-CM

## 2014-12-18 DIAGNOSIS — Z95828 Presence of other vascular implants and grafts: Secondary | ICD-10-CM

## 2014-12-18 LAB — COMPREHENSIVE METABOLIC PANEL (CC13)
ALBUMIN: 2.9 g/dL — AB (ref 3.5–5.0)
ALK PHOS: 129 U/L (ref 40–150)
ALT: 12 U/L (ref 0–55)
AST: 21 U/L (ref 5–34)
Anion Gap: 13 mEq/L — ABNORMAL HIGH (ref 3–11)
BUN: 15 mg/dL (ref 7.0–26.0)
CO2: 30 mEq/L — ABNORMAL HIGH (ref 22–29)
Calcium: 9.2 mg/dL (ref 8.4–10.4)
Chloride: 99 mEq/L (ref 98–109)
Creatinine: 1.2 mg/dL — ABNORMAL HIGH (ref 0.6–1.1)
EGFR: 53 mL/min/{1.73_m2} — ABNORMAL LOW (ref >90)
Glucose: 117 mg/dl (ref 70–140)
Potassium: 3.3 mEq/L — ABNORMAL LOW (ref 3.5–5.1)
Sodium: 142 mEq/L (ref 136–145)
TOTAL PROTEIN: 6.9 g/dL (ref 6.4–8.3)
Total Bilirubin: 0.34 mg/dL (ref 0.20–1.20)

## 2014-12-18 LAB — CBC WITH DIFFERENTIAL/PLATELET
BASO%: 0.3 % (ref 0.0–2.0)
BASOS ABS: 0 10*3/uL (ref 0.0–0.1)
EOS%: 0.6 % (ref 0.0–7.0)
Eosinophils Absolute: 0 10*3/uL (ref 0.0–0.5)
HCT: 35.2 % (ref 34.8–46.6)
HEMOGLOBIN: 11.7 g/dL (ref 11.6–15.9)
LYMPH#: 1 10*3/uL (ref 0.9–3.3)
LYMPH%: 15.8 % (ref 14.0–49.7)
MCH: 28.9 pg (ref 25.1–34.0)
MCHC: 33.1 g/dL (ref 31.5–36.0)
MCV: 87.4 fL (ref 79.5–101.0)
MONO#: 0.5 10*3/uL (ref 0.1–0.9)
MONO%: 8.7 % (ref 0.0–14.0)
NEUT#: 4.6 10*3/uL (ref 1.5–6.5)
NEUT%: 74.6 % (ref 38.4–76.8)
Platelets: 170 10*3/uL (ref 145–400)
RBC: 4.03 10*6/uL (ref 3.70–5.45)
RDW: 15.7 % — AB (ref 11.2–14.5)
WBC: 6.1 10*3/uL (ref 3.9–10.3)

## 2014-12-18 MED ORDER — SODIUM CHLORIDE 0.9 % IJ SOLN
10.0000 mL | INTRAMUSCULAR | Status: DC | PRN
  Filled 2014-12-18: qty 10

## 2014-12-18 MED ORDER — HEPARIN SOD (PORK) LOCK FLUSH 100 UNIT/ML IV SOLN
500.0000 [IU] | Freq: Once | INTRAVENOUS | Status: DC | PRN
  Filled 2014-12-18: qty 5

## 2014-12-18 MED ORDER — DEXTROSE 5 % IV SOLN
Freq: Once | INTRAVENOUS | Status: AC
  Administered 2014-12-18: 12:00:00 via INTRAVENOUS

## 2014-12-18 MED ORDER — OXALIPLATIN CHEMO INJECTION 100 MG/20ML
42.5000 mg/m2 | Freq: Once | INTRAVENOUS | Status: AC
  Administered 2014-12-18: 75 mg via INTRAVENOUS
  Filled 2014-12-18: qty 15

## 2014-12-18 MED ORDER — LIDOCAINE-PRILOCAINE 2.5-2.5 % EX CREA: 1.0000 "application " | TOPICAL_CREAM | CUTANEOUS | Status: AC | PRN

## 2014-12-18 MED ORDER — SODIUM CHLORIDE 0.9 % IV SOLN
Freq: Once | INTRAVENOUS | Status: AC
  Administered 2014-12-18: 11:00:00 via INTRAVENOUS
  Filled 2014-12-18: qty 4

## 2014-12-18 MED ORDER — SODIUM CHLORIDE 0.9 % IV SOLN
Freq: Once | INTRAVENOUS | Status: AC
  Administered 2014-12-18: 11:00:00 via INTRAVENOUS

## 2014-12-18 MED ORDER — SODIUM CHLORIDE 0.9 % IJ SOLN
10.0000 mL | INTRAMUSCULAR | Status: DC | PRN
  Administered 2014-12-18: 10 mL via INTRAVENOUS
  Filled 2014-12-18: qty 10

## 2014-12-18 MED ORDER — FLUOROURACIL CHEMO INJECTION 5 GM/100ML
2400.0000 mg/m2 | INTRAVENOUS | Status: DC
Start: 2014-12-18 — End: 2014-12-18
  Administered 2014-12-18: 4150 mg via INTRAVENOUS
  Filled 2014-12-18: qty 83

## 2014-12-18 MED ORDER — LEUCOVORIN CALCIUM INJECTION 350 MG
405.0000 mg/m2 | Freq: Once | INTRAVENOUS | Status: AC
  Administered 2014-12-18: 700 mg via INTRAVENOUS
  Filled 2014-12-18: qty 35

## 2014-12-18 MED ORDER — SODIUM CHLORIDE 0.9 % IV SOLN
5.0000 mg/kg | Freq: Once | INTRAVENOUS | Status: AC
  Administered 2014-12-18: 375 mg via INTRAVENOUS
  Filled 2014-12-18: qty 15

## 2014-12-18 NOTE — Patient Instructions (Signed)
[place] Discharge Instructions for Patients Receiving Chemotherapy  Today you received the following chemotherapy agents Oxaliplatin, Leucovorin, 5FU and Avastin. To help prevent nausea and vomiting after your treatment, we encourage you to take your nausea medication as directed.   If you develop nausea and vomiting that is not controlled by your nausea medication, call the clinic.   BELOW ARE SYMPTOMS THAT SHOULD BE REPORTED IMMEDIATELY:  *FEVER GREATER THAN 100.5 F  *CHILLS WITH OR WITHOUT FEVER  NAUSEA AND VOMITING THAT IS NOT CONTROLLED WITH YOUR NAUSEA MEDICATION  *UNUSUAL SHORTNESS OF BREATH  *UNUSUAL BRUISING OR BLEEDING  TENDERNESS IN MOUTH AND THROAT WITH OR WITHOUT PRESENCE OF ULCERS  *URINARY PROBLEMS  *BOWEL PROBLEMS  UNUSUAL RASH Items with * indicate a potential emergency and should be followed up as soon as possible.  Feel free to call the clinic you have any questions or concerns. The clinic phone number is [phone].  Please show the [place] at check-in to the Emergency Department and triage nurse.

## 2014-12-18 NOTE — Patient Instructions (Signed)

## 2014-12-19 ENCOUNTER — Encounter: Payer: Self-pay | Admitting: *Deleted

## 2014-12-19 ENCOUNTER — Encounter: Payer: Self-pay | Admitting: Hematology and Oncology

## 2014-12-19 NOTE — Progress Notes (Signed)
Received letter from Dr. Franchot Mimes regarding pt's suboxone prescription.  Letter states pt can take prescription pain medication if she stops Suboxone one day prior to pain meds and restarts Suboxone one day after pain meds.   Faxed letter from Dr. Alvy Bimler back to Dr. Franchot Mimes at Fax [phone]).   See Dr. Calton Dach letter dated today in EMR.

## 2014-12-20 ENCOUNTER — Ambulatory Visit (HOSPITAL_BASED_OUTPATIENT_CLINIC_OR_DEPARTMENT_OTHER): Payer: Medicaid Other

## 2014-12-20 ENCOUNTER — Telehealth: Payer: Self-pay | Admitting: Family Medicine

## 2014-12-20 VITALS — BP 118/68 | HR 64 | Temp 98.4°F | Resp 20

## 2014-12-20 DIAGNOSIS — C786 Secondary malignant neoplasm of retroperitoneum and peritoneum: Secondary | ICD-10-CM | POA: Diagnosis not present

## 2014-12-20 DIAGNOSIS — C787 Secondary malignant neoplasm of liver and intrahepatic bile duct: Secondary | ICD-10-CM | POA: Diagnosis not present

## 2014-12-20 DIAGNOSIS — C182 Malignant neoplasm of ascending colon: Secondary | ICD-10-CM | POA: Diagnosis present

## 2014-12-20 DIAGNOSIS — C189 Malignant neoplasm of colon, unspecified: Secondary | ICD-10-CM

## 2014-12-20 MED ORDER — SODIUM CHLORIDE 0.9 % IJ SOLN
10.0000 mL | INTRAMUSCULAR | Status: DC | PRN
  Administered 2014-12-20: 10 mL
  Filled 2014-12-20: qty 10

## 2014-12-20 MED ORDER — HEPARIN SOD (PORK) LOCK FLUSH 100 UNIT/ML IV SOLN
500.0000 [IU] | Freq: Once | INTRAVENOUS | Status: AC | PRN
  Administered 2014-12-20: 500 [IU]
  Filled 2014-12-20: qty 5

## 2014-12-20 NOTE — Patient Instructions (Signed)
Fluorouracil, 5FU; Diclofenac topical cream What is this medicine? FLUOROURACIL; DICLOFENAC (flure oh YOOR a sil; dye KLOE fen ak) is a combination of a topical chemotherapy agent and non-steroidal anti-inflammatory drug (NSAID). It is used on the skin to treat skin cancer and skin conditions that could become cancer. This medicine may be used for other purposes; ask your health care provider or pharmacist if you have questions. COMMON BRAND NAME(S): FLUORAC What should I tell my health care provider before I take this medicine? They need to know if you have any of these conditions: -bleeding problems -cigarette smoker -DPD enzyme deficiency -heart disease -high blood pressure -if you frequently drink alcohol containing drinks -kidney disease -liver disease -open or infected skin -stomach problems -swelling or open sores at the treatment site -recent or planned coronary artery bypass graft (CABG) surgery -an unusual or allergic reaction to fluorouracil, diclofenac, aspirin, other NSAIDs, other medicines, foods, dyes, or preservatives -pregnant or trying to get pregnant -breast-feeding How should I use this medicine? This medicine is only for use on the skin. Follow the directions on the prescription label. Wash hands before and after use. Wash affected area and gently pat dry. To apply this medicine use a cotton-tipped applicator, or use gloves if applying with fingertips. If applied with unprotected fingertips, it is very important to wash your hands well after you apply this medicine. Avoid applying to the eyes, nose, or mouth. Apply enough medicine to cover the affected area. You can cover the area with a light gauze dressing, but do not use tight or air-tight dressings. Finish the full course prescribed by your doctor or health care professional, even if you think your condition is better. Do not stop taking except on the advice of your doctor or health care professional. Talk to your  pediatrician regarding the use of this medicine in children. Special care may be needed. Overdosage: If you think you've taken too much of this medicine contact a poison control center or emergency room at once. Overdosage: If you think you have taken too much of this medicine contact a poison control center or emergency room at once. NOTE: This medicine is only for you. Do not share this medicine with others. What if I miss a dose? If you miss a dose, apply it as soon as you can. If it is almost time for your next dose, only use that dose. Do not apply extra doses. Contact your doctor or health care professional if you miss more than one dose. What may interact with this medicine? Interactions are not expected. Do not use any other skin products without telling your doctor or health care professional. This list may not describe all possible interactions. Give your health care provider a list of all the medicines, herbs, non-prescription drugs, or dietary supplements you use. Also tell them if you smoke, drink alcohol, or use illegal drugs. Some items may interact with your medicine. What should I watch for while using this medicine? Visit your doctor or health care professional for checks on your progress. You will need to use this medicine for 2 to 6 weeks. This may be longer depending on the condition being treated. You may not see full healing for another 1 to 2 months after you stop using the medicine. Treated areas of skin can look unsightly during and for several weeks after treatment with this medicine. This medicine can make you more sensitive to the sun. Keep out of the sun. If you cannot avoid being in   the sun, wear protective clothing and use sunscreen. Do not use sun lamps or tanning beds/booths. Where should I keep my What side effects may I notice from receiving this medicine? Side effects that you should report to your doctor or health care professional as soon as possible: -allergic  reactions like skin rash, itching or hives, swelling of the face, lips, or tongue -black or bloody stools, blood in the urine or vomit -blurred vision -chest pain -difficulty breathing or wheezing -redness, blistering, peeling or loosening of the skin, including inside the mouth -severe redness and swelling of normal skin -slurred speech or weakness on one side of the body -trouble passing urine or change in the amount of urine -unexplained weight gain or swelling -unusually weak or tired -yellowing of eyes or skin Side effects that usually do not require medical attention (Report these to your doctor or health care professional if they continue or are bothersome.): -increased sensitivity of the skin to sun and ultraviolet light -pain and burning of the affected area -scaling or swelling of the affected area -skin rash, itching of the affected area -tenderness This list may not describe all possible side effects. Call your doctor for medical advice about side effects. You may report side effects to FDA at 1-800-FDA-1088. Where should I keep my medicine? Keep out of the reach of children. Store at room temperature between 20 and 25 degrees C (68 and 77 degrees F). Throw away any unused medicine after the expiration date. NOTE: This sheet is a summary. It may not cover all possible information. If you have questions about this medicine, talk to your doctor, pharmacist, or health care provider.  2015, [employer]vier/Gold Standard. (2013-11-21 11:09:58)  

## 2014-12-20 NOTE — Telephone Encounter (Signed)
Pt cancer dr Dr Garlan Fair says it is ok for Dr Alease Frame to manage her pain. Pt says the only thing she has to do is come by her and pick up the RX Says she was taking 4mg  of Diladud Please call when Rx is ready for pciup

## 2014-12-21 NOTE — Progress Notes (Signed)
[place] Social Work  Clinical Social Work was referred by MD to review and complete healthcare advance directives and provide emotional support.  Clinical Social Worker met with patient and patient's friend in infusion room.  The patient designated father Monica [name] as their primary healthcare agent and friend Monica [name] as their secondary agent.  Patient also completed healthcare living will.  Patient's friend Monica [name] shared she was a close friend of patient's mother and she promised patient's mother she would "care for patient".  CSW and patient/patient's friend discussed patient's recent loss of her mother.  [name] feels she is coping well, but was drowsy during infusion treatment.  CSW encouraged patient to utilize free grief counseling offered through [facility].  They shared they were familiar with these services.  Clinical Social Worker notarized documents and made copies for patient/family. Clinical Social Worker will send documents to medical records to be scanned into patient's chart. Clinical Social Worker encouraged patient/family to contact with any additional questions or concerns.  Polo Riley, MSW, [place] Worker [facility] [phone]

## 2014-12-22 ENCOUNTER — Other Ambulatory Visit: Payer: Self-pay | Admitting: Family Medicine

## 2014-12-22 DIAGNOSIS — C787 Secondary malignant neoplasm of liver and intrahepatic bile duct: Secondary | ICD-10-CM

## 2014-12-22 MED ORDER — HYDROMORPHONE HCL 4 MG PO TABS: 4.0000 mg | ORAL_TABLET | Freq: Three times a day (TID) | ORAL | Status: DC | PRN

## 2014-12-22 NOTE — Telephone Encounter (Signed)
LMOVM for pt to call us back. Deseree Blount, CMA  

## 2014-12-22 NOTE — Telephone Encounter (Signed)
Prescription left up front. Please call patient to let her know (sorry, I'm on nights)  Thanks! -Loa Socks

## 2014-12-26 ENCOUNTER — Other Ambulatory Visit: Payer: Self-pay | Admitting: Family Medicine

## 2014-12-26 ENCOUNTER — Telehealth: Payer: Self-pay | Admitting: *Deleted

## 2014-12-26 MED ORDER — GABAPENTIN 800 MG PO TABS: 800.0000 mg | ORAL_TABLET | Freq: Three times a day (TID) | ORAL | Status: AC

## 2014-12-26 NOTE — Telephone Encounter (Signed)
Pt called stating that she does not think the Lasix is working anymore. Her hands and feet are swollen. Pt denies any swelling at last visit.  Pt denies any chest pain or SOB.  Pt stated she lives in [place] and hard for her to get a ride her.  Pt also wanted to know why her Gabapentin medication dosage was decreased to 400 mg from 800 mg.  Pt advised she should go to nearest urgent care or ED if she can not get to [facility] clinic.  Please advise.  Derl Barrow, RN

## 2014-12-26 NOTE — Telephone Encounter (Signed)
Pt called back stating that her Cancer doctor will not prescribe her any pain medication, please advise.  Derl Barrow, RN

## 2015-01-02 ENCOUNTER — Other Ambulatory Visit (HOSPITAL_BASED_OUTPATIENT_CLINIC_OR_DEPARTMENT_OTHER): Payer: Medicaid Other

## 2015-01-02 ENCOUNTER — Encounter: Payer: Self-pay | Admitting: Hematology and Oncology

## 2015-01-02 ENCOUNTER — Ambulatory Visit (HOSPITAL_BASED_OUTPATIENT_CLINIC_OR_DEPARTMENT_OTHER): Payer: Medicaid Other | Admitting: Hematology and Oncology

## 2015-01-02 ENCOUNTER — Ambulatory Visit: Payer: Medicaid Other

## 2015-01-02 ENCOUNTER — Other Ambulatory Visit: Payer: Self-pay | Admitting: Hematology and Oncology

## 2015-01-02 ENCOUNTER — Ambulatory Visit (HOSPITAL_BASED_OUTPATIENT_CLINIC_OR_DEPARTMENT_OTHER): Payer: Medicaid Other

## 2015-01-02 VITALS — BP 100/59 | HR 61 | Temp 98.4°F

## 2015-01-02 VITALS — BP 116/66 | HR 78 | Temp 98.2°F | Resp 18 | Ht 65.0 in | Wt 152.4 lb

## 2015-01-02 DIAGNOSIS — C189 Malignant neoplasm of colon, unspecified: Secondary | ICD-10-CM

## 2015-01-02 DIAGNOSIS — Z5112 Encounter for antineoplastic immunotherapy: Secondary | ICD-10-CM

## 2015-01-02 DIAGNOSIS — C786 Secondary malignant neoplasm of retroperitoneum and peritoneum: Secondary | ICD-10-CM | POA: Diagnosis not present

## 2015-01-02 DIAGNOSIS — C182 Malignant neoplasm of ascending colon: Secondary | ICD-10-CM

## 2015-01-02 DIAGNOSIS — Z5111 Encounter for antineoplastic chemotherapy: Secondary | ICD-10-CM

## 2015-01-02 DIAGNOSIS — G894 Chronic pain syndrome: Secondary | ICD-10-CM

## 2015-01-02 DIAGNOSIS — E1340 Other specified diabetes mellitus with diabetic neuropathy, unspecified: Secondary | ICD-10-CM

## 2015-01-02 DIAGNOSIS — N183 Chronic kidney disease, stage 3 unspecified: Secondary | ICD-10-CM

## 2015-01-02 DIAGNOSIS — Z95828 Presence of other vascular implants and grafts: Secondary | ICD-10-CM

## 2015-01-02 DIAGNOSIS — C787 Secondary malignant neoplasm of liver and intrahepatic bile duct: Secondary | ICD-10-CM | POA: Diagnosis not present

## 2015-01-02 DIAGNOSIS — G8929 Other chronic pain: Secondary | ICD-10-CM

## 2015-01-02 DIAGNOSIS — E119 Type 2 diabetes mellitus without complications: Secondary | ICD-10-CM | POA: Diagnosis not present

## 2015-01-02 DIAGNOSIS — G893 Neoplasm related pain (acute) (chronic): Secondary | ICD-10-CM

## 2015-01-02 DIAGNOSIS — G629 Polyneuropathy, unspecified: Secondary | ICD-10-CM

## 2015-01-02 LAB — CBC WITH DIFFERENTIAL/PLATELET
BASO%: 0.5 % (ref 0.0–2.0)
BASOS ABS: 0 10*3/uL (ref 0.0–0.1)
EOS%: 0.6 % (ref 0.0–7.0)
Eosinophils Absolute: 0 10*3/uL (ref 0.0–0.5)
HEMATOCRIT: 36 % (ref 34.8–46.6)
HGB: 12 g/dL (ref 11.6–15.9)
LYMPH#: 0.6 10*3/uL — AB (ref 0.9–3.3)
LYMPH%: 8.2 % — ABNORMAL LOW (ref 14.0–49.7)
MCH: 28.8 pg (ref 25.1–34.0)
MCHC: 33.2 g/dL (ref 31.5–36.0)
MCV: 86.7 fL (ref 79.5–101.0)
MONO#: 0.6 10*3/uL (ref 0.1–0.9)
MONO%: 9.3 % (ref 0.0–14.0)
NEUT#: 5.7 10*3/uL (ref 1.5–6.5)
NEUT%: 81.4 % — ABNORMAL HIGH (ref 38.4–76.8)
Platelets: 158 10*3/uL (ref 145–400)
RBC: 4.16 10*6/uL (ref 3.70–5.45)
RDW: 15.9 % — AB (ref 11.2–14.5)
WBC: 7 10*3/uL (ref 3.9–10.3)

## 2015-01-02 LAB — COMPREHENSIVE METABOLIC PANEL (CC13)
ALBUMIN: 2.8 g/dL — AB (ref 3.5–5.0)
ALK PHOS: 117 U/L (ref 40–150)
ALT: 11 U/L (ref 0–55)
AST: 24 U/L (ref 5–34)
Anion Gap: 13 mEq/L — ABNORMAL HIGH (ref 3–11)
BILIRUBIN TOTAL: 0.46 mg/dL (ref 0.20–1.20)
BUN: 13.9 mg/dL (ref 7.0–26.0)
CO2: 28 mEq/L (ref 22–29)
CREATININE: 1.2 mg/dL — AB (ref 0.6–1.1)
Calcium: 8.9 mg/dL (ref 8.4–10.4)
Chloride: 100 mEq/L (ref 98–109)
EGFR: 52 mL/min/{1.73_m2} — AB (ref >90)
GLUCOSE: 148 mg/dL — AB (ref 70–140)
POTASSIUM: 3.6 meq/L (ref 3.5–5.1)
Sodium: 141 mEq/L (ref 136–145)
Total Protein: 7.2 g/dL (ref 6.4–8.3)

## 2015-01-02 MED ORDER — SODIUM CHLORIDE 0.9 % IV SOLN
5.0000 mg/kg | Freq: Once | INTRAVENOUS | Status: AC
  Administered 2015-01-02: 375 mg via INTRAVENOUS
  Filled 2015-01-02: qty 15

## 2015-01-02 MED ORDER — SODIUM CHLORIDE 0.9 % IV SOLN
2400.0000 mg/m2 | INTRAVENOUS | Status: DC
  Administered 2015-01-02: 4150 mg via INTRAVENOUS
  Filled 2015-01-02: qty 83

## 2015-01-02 MED ORDER — SODIUM CHLORIDE 0.9 % IV SOLN
Freq: Once | INTRAVENOUS | Status: AC
  Administered 2015-01-02: 12:00:00 via INTRAVENOUS

## 2015-01-02 MED ORDER — DEXTROSE 5 % IV SOLN
Freq: Once | INTRAVENOUS | Status: AC
  Administered 2015-01-02: 13:00:00 via INTRAVENOUS

## 2015-01-02 MED ORDER — LEUCOVORIN CALCIUM INJECTION 350 MG: 400.0000 mg/m2 | Freq: Once | INTRAVENOUS | Status: DC

## 2015-01-02 MED ORDER — SODIUM CHLORIDE 0.9 % IV SOLN
Freq: Once | INTRAVENOUS | Status: AC
  Administered 2015-01-02: 13:00:00 via INTRAVENOUS
  Filled 2015-01-02: qty 4

## 2015-01-02 MED ORDER — OXALIPLATIN CHEMO INJECTION 100 MG/20ML
42.5000 mg/m2 | Freq: Once | INTRAVENOUS | Status: AC
  Administered 2015-01-02: 75 mg via INTRAVENOUS
  Filled 2015-01-02: qty 15

## 2015-01-02 MED ORDER — SODIUM CHLORIDE 0.9 % IJ SOLN
10.0000 mL | INTRAMUSCULAR | Status: DC | PRN
  Administered 2015-01-02: 10 mL via INTRAVENOUS
  Filled 2015-01-02: qty 10

## 2015-01-02 MED ORDER — LEUCOVORIN CALCIUM INJECTION 350 MG
405.0000 mg/m2 | Freq: Once | INTRAMUSCULAR | Status: AC
  Administered 2015-01-02: 700 mg via INTRAVENOUS
  Filled 2015-01-02: qty 35

## 2015-01-02 NOTE — Telephone Encounter (Signed)
Pt called again about pain meds. She is currently having an infusion at the cancer center. She would like to have some more pain meds.  The gabapentin isnt working Please advise (i had a hard time understanding what she was saying because she was slurring her words)

## 2015-01-02 NOTE — Assessment & Plan Note (Signed)
The patient had excellent treatment to FOLFOX plus Avastin in the past. However, oxaliplatin has to be discontinued due to severe peripheral neuropathy. Unfortunately, repeat imaging studies show disease progression. Recently, I added oxaliplatin back to her treatment plan. I plan to repeat imaging studies again after tomorrow treatment. If she has no response to treatment, we may have to switch her to FOLFIRI and Aflibercept. We also discussed prognosis which is poor. I discussed this in the presence of her caregiver.

## 2015-01-02 NOTE — Assessment & Plan Note (Signed)
Her kidney function tests is unaffected by recent chemotherapy. Continue close observation. I recommend she hold Lasix the day before, the day of and the day after CT scan.

## 2015-01-02 NOTE — Progress Notes (Signed)
[place] OFFICE PROGRESS NOTE  Patient Care Team: Elberta Leatherwood, MD as PCP - General Fleet Contras, MD as Consulting Physician (Nephrology) Leatrice Jewels Rayetta Pigg, MD as Consulting Physician (Psychiatry) Heath Lark, MD as Consulting Physician (Hematology and Oncology)  SUMMARY OF ONCOLOGIC HISTORY: Oncology History   Colon cancer, KRAS positive   Primary site: Colon and Rectum (Right)   Staging method: AJCC 7th Edition   Clinical: (T4b, N2b, M1) signed by Heath Lark, MD on 08/05/2013  9:52 AM   Pathologic: Stage IVB (T4b, N2b, M1b) signed by Heath Lark, MD on 08/05/2013 10:21 AM   Summary: Stage IVB (T4b, N2b, M1b)      Colon cancer   07/15/2013 - 07/25/2013 Hospital Admission The patient presented to the hospital with pain in her abdomen and underwent surgery for colon cancer   07/15/2013 Imaging CT scan of the abdomen show bowel dilatation suspicious for malignancy   07/17/2013 Tumor Marker Preoperative CEA was 2.2, normal   07/19/2013 Procedure Colonoscopy revealed ascending colon lesion with significant stricture and biopsy showed adenocarcinoma   07/20/2013 Surgery She underwent right hemicolectomy, cholecystectomy, liver biopsy, excision of abdominal wall mass and omental mass. During surgery, she was also found to have drop metastasis   08/29/2013 Imaging Repeat CT scan show persistent liver metastasis and new omental metastasis   08/31/2013 - 02/10/2014 Chemotherapy The patient was started on palliative chemotherapy with FOLFOX, bolus 5-FU committed and oxaliplatin dose reduced due to  peripheral neuropathy. She has completed 12 cycles of treatment.   11/22/2013 Imaging Repeat CT scan showed near complete response to treatment.   11/23/2013 - 02/10/2014 Chemotherapy Avastin was added to her chemotherapy regimen   12/21/2013 Adverse Reaction Oxaliplatin was omitted due to worsening peripheral neuropathy.   02/20/2014 Imaging Repeat CT scan showed near complete resolution  of all disease.   05/15/2014 Imaging Repeat CT scan showed peritoneal metastasis and recurrence of disease   05/22/2014 -  Chemotherapy Modify dosings of FOLFOX was restarted.   07/14/2014 Imaging CT scan of the abdomen and pelvis show positive response to treatment.   09/07/2014 Imaging Repeat CT scan of the chest, abdomen and pelvis shows stable disease.   09/08/2014 Adverse Reaction She will continue treatment without oxaliplatin due to severe peripheral neuropathy.   11/30/2014 Imaging Ct scan showed significant disease progression    INTERVAL HISTORY: Please see below for problem oriented charting. She is seen prior to chemotherapy today. She brought with her her caregiver. She continues to have intermittent abdominal pain and chronic constipation. The patient continues to appear to be sedated during her interview. She denies further hypoglycemia episode  REVIEW OF SYSTEMS:   Constitutional: Denies fevers, chills or abnormal weight loss Eyes: Denies blurriness of vision Ears, nose, mouth, throat, and face: Denies mucositis or sore throat Respiratory: Denies cough, dyspnea or wheezes Cardiovascular: Denies palpitation, chest discomfort or lower extremity swelling Skin: Denies abnormal skin rashes Lymphatics: Denies new lymphadenopathy or easy bruising Neurological:Denies numbness, tingling or new weaknesses Behavioral/Psych: Mood is stable, no new changes  All other systems were reviewed with the patient and are negative.  I have reviewed the past medical history, past surgical history, social history and family history with the patient and they are unchanged from previous note.  ALLERGIES:  is allergic to naproxen.  MEDICATIONS:  Current Outpatient Prescriptions  Medication Sig Dispense Refill  . atorvastatin (LIPITOR) 40 MG tablet Take 1 tablet (40 mg total) by mouth daily. 30 tablet 5  . Buprenorphine HCl-Naloxone  HCl (SUBOXONE SL) Place 8 mg under the tongue 3 (three) times  daily.    . clonazePAM (KLONOPIN) 1 MG tablet Take 1 mg by mouth 3 (three) times daily.     . diazepam (VALIUM) 10 MG tablet Take 10 mg by mouth every 8 (eight) hours as needed for anxiety.    . furosemide (LASIX) 40 MG tablet Take 1 tablet (40 mg total) by mouth 2 (two) times daily. 60 tablet 5  . gabapentin (NEURONTIN) 800 MG tablet Take 1 tablet (800 mg total) by mouth 3 (three) times daily. 180 tablet 3  . glucose blood (ACCU-CHEK AVIVA PLUS) test strip USE TO CHECK BLOOD SUGAR EVERY DAY UP TO 4 TIMES. 100 each 10  . HYDROmorphone (DILAUDID) 4 MG tablet Take 1 tablet (4 mg total) by mouth 3 (three) times daily as needed for severe pain. 90 tablet 0  . hydrOXYzine (ATARAX/VISTARIL) 50 MG tablet Take 25 mg by mouth 3 (three) times daily.    . insulin lispro (HUMALOG) 100 UNIT/ML injection Inject 1-10 Units into the skin 3 (three) times daily before meals.    . lamoTRIgine (LAMICTAL) 100 MG tablet Take 200 mg by mouth every morning.     Marland Kitchen LANTUS 100 UNIT/ML injection INJECT 27 UNITS into SKIN EVERY DAY 10 mL 3  . lidocaine-prilocaine (EMLA) cream Apply 1 application topically as needed (for pain). Apply to port a cath site one hour prior to needle stick. 30 g 2  . omeprazole (PRILOSEC) 20 MG capsule Take 20 mg by mouth every morning.    . polyethylene glycol powder (GLYCOLAX/MIRALAX) powder Take 17 g by mouth 2 (two) times daily as needed. (Patient taking differently: Take 17 g by mouth 2 (two) times daily as needed for mild constipation. ) 3350 g 3  . ROZEREM 8 MG tablet Take 8 mg by mouth at bedtime.  0  . senna-docusate (SENOKOT-S) 8.6-50 MG per tablet Take 1 tablet by mouth 2 (two) times daily. 90 tablet 3  . ziprasidone (GEODON) 80 MG capsule Take 160 mg by mouth at bedtime.      No current facility-administered medications for this visit.   Facility-Administered Medications Ordered in Other Visits  Medication Dose Route Frequency Provider Last Rate Last Dose  . fluorouracil (ADRUCIL)  4,150 mg in sodium chloride 0.9 % 67 mL chemo infusion  2,400 mg/m2 (Treatment Plan Adjusted) Intravenous 1 day or 1 dose Heath Lark, MD   4,150 mg at 01/02/15 1510  . sodium chloride 0.9 % injection 10 mL  10 mL Intravenous PRN Heath Lark, MD   10 mL at 09/25/14 0913    PHYSICAL EXAMINATION: ECOG PERFORMANCE STATUS: 1 - Symptomatic but completely ambulatory  Filed Vitals:   01/02/15 1045  BP: 116/66  Pulse: 78  Temp: 98.2 F (36.8 C)  Resp: 18   Filed Weights   01/02/15 1045  Weight: 152 lb 6.4 oz (69.128 kg)    GENERAL:alert, no distress and comfortable SKIN: skin color, texture, turgor are normal, no rashes or significant lesions EYES: normal, Conjunctiva are pink and non-injected, sclera clear OROPHARYNX:no exudate, no erythema and lips, buccal mucosa, and tongue normal  NECK: supple, thyroid normal size, non-tender, without nodularity LYMPH:  no palpable lymphadenopathy in the cervical, axillary or inguinal LUNGS: clear to auscultation and percussion with normal breathing effort HEART: regular rate & rhythm and no murmurs and no lower extremity edema ABDOMEN:abdomen soft, non-tender and normal bowel sounds Musculoskeletal:no cyanosis of digits and no clubbing  NEURO: She  appears sedated, oriented x 3 with fluent speech, no focal motor/sensory deficits  LABORATORY DATA:  I have reviewed the data as listed    Component Value Date/Time   NA 141 01/02/2015 1030   NA 140 11/08/2014 1209   K 3.6 01/02/2015 1030   K 3.7 11/08/2014 1209   CL 98 11/08/2014 1209   CO2 28 01/02/2015 1030   CO2 29 11/08/2014 1209   GLUCOSE 148* 01/02/2015 1030   GLUCOSE 201* 11/08/2014 1209   BUN 13.9 01/02/2015 1030   BUN 15 11/08/2014 1209   CREATININE 1.2* 01/02/2015 1030   CREATININE 1.11* 11/08/2014 1209   CREATININE 1.38* 05/26/2013 1703   CALCIUM 8.9 01/02/2015 1030   CALCIUM 9.1 11/08/2014 1209   PROT 7.2 01/02/2015 1030   PROT 7.1 05/14/2014 2233   ALBUMIN 2.8* 01/02/2015 1030    ALBUMIN 3.1* 05/14/2014 2233   AST 24 01/02/2015 1030   AST 28 05/14/2014 2233   ALT 11 01/02/2015 1030   ALT 18 05/14/2014 2233   ALKPHOS 117 01/02/2015 1030   ALKPHOS 124* 05/14/2014 2233   BILITOT 0.46 01/02/2015 1030   BILITOT 0.2* 05/14/2014 2233   GFRNONAA 56* 11/08/2014 1209   GFRNONAA 44* 05/26/2013 1703   GFRAA 65* 11/08/2014 1209   GFRAA 51* 05/26/2013 1703    No results found for: SPEP, UPEP  Lab Results  Component Value Date   WBC 7.0 01/02/2015   NEUTROABS 5.7 01/02/2015   HGB 12.0 01/02/2015   HCT 36.0 01/02/2015   MCV 86.7 01/02/2015   PLT 158 01/02/2015      Chemistry      Component Value Date/Time   NA 141 01/02/2015 1030   NA 140 11/08/2014 1209   K 3.6 01/02/2015 1030   K 3.7 11/08/2014 1209   CL 98 11/08/2014 1209   CO2 28 01/02/2015 1030   CO2 29 11/08/2014 1209   BUN 13.9 01/02/2015 1030   BUN 15 11/08/2014 1209   CREATININE 1.2* 01/02/2015 1030   CREATININE 1.11* 11/08/2014 1209   CREATININE 1.38* 05/26/2013 1703      Component Value Date/Time   CALCIUM 8.9 01/02/2015 1030   CALCIUM 9.1 11/08/2014 1209   ALKPHOS 117 01/02/2015 1030   ALKPHOS 124* 05/14/2014 2233   AST 24 01/02/2015 1030   AST 28 05/14/2014 2233   ALT 11 01/02/2015 1030   ALT 18 05/14/2014 2233   BILITOT 0.46 01/02/2015 1030   BILITOT 0.2* 05/14/2014 2233       ASSESSMENT & PLAN:  Colon cancer The patient had excellent treatment to FOLFOX plus Avastin in the past. However, oxaliplatin has to be discontinued due to severe peripheral neuropathy. Unfortunately, repeat imaging studies show disease progression. Recently, I added oxaliplatin back to her treatment plan. I plan to repeat imaging studies again after tomorrow treatment. If she has no response to treatment, we may have to switch her to FOLFIRI and Aflibercept. We also discussed prognosis which is poor. I discussed this in the presence of her caregiver.   Metastasis to liver She tolerated treatment  well. Liver function tests are normal.    Chronic pain disorder She has chronic pain disorder and cancer related pain. The patient is also taking Suboxone. I reinforced narcotic refill policy with the patient. I do not think it makes sense for her to be on 2 different pain medicine and I would not prescribe Dilaudid anymore as she is on Suboxone. Hopefully, if the chemotherapy work, her pain requirement would be less. The  patient also appeared to be heavily sedated during all her treatment.    CKD (chronic kidney disease), stage III Her kidney function tests is unaffected by recent chemotherapy. Continue close observation. I recommend she hold Lasix the day before, the day of and the day after CT scan.     Neuropathy due to secondary diabetes This is slightly worse while she is on oxaliplatin. She may have component of neuropathy from diabetes. She is on high-dose gabapentin and pain medicine. Continue the same.       Orders Placed This Encounter  Procedures  . CT Chest W Contrast    Standing Status: Future     Number of Occurrences:      Standing Expiration Date: 03/03/2016    Order Specific Question:  Reason for Exam (SYMPTOM  OR DIAGNOSIS REQUIRED)    Answer:  staging metastatic colon ca    Order Specific Question:  Is the patient pregnant?    Answer:  No    Order Specific Question:  Preferred imaging location?    Answer:  [facility]  . CT Abdomen Pelvis W Contrast    Standing Status: Future     Number of Occurrences:      Standing Expiration Date: 04/03/2016    Order Specific Question:  Reason for Exam (SYMPTOM  OR DIAGNOSIS REQUIRED)    Answer:  staging metastatic colon ca    Order Specific Question:  Is the patient pregnant?    Answer:  No    Order Specific Question:  Preferred imaging location?    Answer:  [facility]   All questions were answered. The patient knows to call the clinic with any problems, questions or concerns. No barriers to  learning was detected. I spent 30 minutes counseling the patient face to face. The total time spent in the appointment was 40 minutes and more than 50% was on counseling and review of test results     [facility], [place], MD 01/02/2015 5:10 PM

## 2015-01-02 NOTE — Patient Instructions (Signed)

## 2015-01-02 NOTE — Assessment & Plan Note (Signed)
She tolerated treatment well. Liver function tests are normal.

## 2015-01-02 NOTE — Assessment & Plan Note (Signed)
She has chronic pain disorder and cancer related pain. The patient is also taking Suboxone. I reinforced narcotic refill policy with the patient. I do not think it makes sense for her to be on 2 different pain medicine and I would not prescribe Dilaudid anymore as she is on Suboxone. Hopefully, if the chemotherapy work, her pain requirement would be less. The patient also appeared to be heavily sedated during all her treatment.

## 2015-01-02 NOTE — Patient Instructions (Signed)
[place] Discharge Instructions for Patients Receiving Chemotherapy  Today you received the following chemotherapy agents Oxaliplatin, Leucovorin, 5-FU  To help prevent nausea and vomiting after your treatment, we encourage you to take your nausea medication     If you develop nausea and vomiting that is not controlled by your nausea medication, call the clinic.   BELOW ARE SYMPTOMS THAT SHOULD BE REPORTED IMMEDIATELY:  *FEVER GREATER THAN 100.5 F  *CHILLS WITH OR WITHOUT FEVER  NAUSEA AND VOMITING THAT IS NOT CONTROLLED WITH YOUR NAUSEA MEDICATION  *UNUSUAL SHORTNESS OF BREATH  *UNUSUAL BRUISING OR BLEEDING  TENDERNESS IN MOUTH AND THROAT WITH OR WITHOUT PRESENCE OF ULCERS  *URINARY PROBLEMS  *BOWEL PROBLEMS  UNUSUAL RASH Items with * indicate a potential emergency and should be followed up as soon as possible.  Feel free to call the clinic you have any questions or concerns. The clinic phone number is [phone].  Please show the [place] at check-in to the Emergency Department and triage nurse.

## 2015-01-02 NOTE — Progress Notes (Signed)
Vital signs stable pre and post Avastin 

## 2015-01-02 NOTE — Assessment & Plan Note (Signed)
This is slightly worse while she is on oxaliplatin. She may have component of neuropathy from diabetes. She is on high-dose gabapentin and pain medicine. Continue the same.

## 2015-01-03 ENCOUNTER — Telehealth: Payer: Self-pay | Admitting: Hematology and Oncology

## 2015-01-03 NOTE — Telephone Encounter (Signed)
lvm for pt regarding to [place] appts....advised pt to get new sched today

## 2015-01-04 ENCOUNTER — Telehealth: Payer: Self-pay | Admitting: Hematology and Oncology

## 2015-01-04 ENCOUNTER — Telehealth: Payer: Self-pay | Admitting: Family Medicine

## 2015-01-04 ENCOUNTER — Ambulatory Visit (HOSPITAL_BASED_OUTPATIENT_CLINIC_OR_DEPARTMENT_OTHER): Payer: Medicaid Other

## 2015-01-04 VITALS — BP 122/71 | HR 50 | Temp 97.4°F | Resp 16

## 2015-01-04 DIAGNOSIS — C786 Secondary malignant neoplasm of retroperitoneum and peritoneum: Secondary | ICD-10-CM

## 2015-01-04 DIAGNOSIS — C787 Secondary malignant neoplasm of liver and intrahepatic bile duct: Secondary | ICD-10-CM

## 2015-01-04 DIAGNOSIS — C189 Malignant neoplasm of colon, unspecified: Secondary | ICD-10-CM

## 2015-01-04 DIAGNOSIS — C182 Malignant neoplasm of ascending colon: Secondary | ICD-10-CM | POA: Diagnosis not present

## 2015-01-04 MED ORDER — HEPARIN SOD (PORK) LOCK FLUSH 100 UNIT/ML IV SOLN
500.0000 [IU] | Freq: Once | INTRAVENOUS | Status: AC | PRN
  Administered 2015-01-04: 500 [IU]
  Filled 2015-01-04: qty 5

## 2015-01-04 MED ORDER — SODIUM CHLORIDE 0.9 % IJ SOLN
10.0000 mL | INTRAMUSCULAR | Status: DC | PRN
  Administered 2015-01-04: 10 mL
  Filled 2015-01-04: qty 10

## 2015-01-04 NOTE — Telephone Encounter (Signed)
per pof to sch pt appt-gave pt copy of sch °

## 2015-01-04 NOTE — Telephone Encounter (Signed)
[facility] After Hours Line  Received page from [facility] stating patient has a prescription which is not considered legal. Prescription is for hydromorphone 4mg . Prescription has "[facility]" on the script instead of family practice information. Instructed pharmacy to give prescription back to patient so she can return to clinic to pick up a new prescription tomorrow.  Cordelia Poche, MD PGY-2, [facility] 01/04/2015, 7:54 PM

## 2015-01-05 ENCOUNTER — Telehealth: Payer: Self-pay | Admitting: Family Medicine

## 2015-01-05 ENCOUNTER — Other Ambulatory Visit: Payer: Self-pay | Admitting: Family Medicine

## 2015-01-05 DIAGNOSIS — C787 Secondary malignant neoplasm of liver and intrahepatic bile duct: Secondary | ICD-10-CM

## 2015-01-05 MED ORDER — HYDROMORPHONE HCL 4 MG PO TABS: 4.0000 mg | ORAL_TABLET | Freq: Three times a day (TID) | ORAL | Status: DC | PRN

## 2015-01-05 NOTE — Telephone Encounter (Signed)
Pharmacy called and stated patient is still on Suboxone and will like to check if to refill her Dilaudid despite being on Suboxone. I recommended that they cancel refill for now and have patient come discuss this medication with her PCP. I called pharmacy and spoke with Maudie Mercury who confirmed that the refill was canceled.

## 2015-01-05 NOTE — Telephone Encounter (Signed)
Pharmacy called to clarify rx given to pt, says she also gets suboxone from another doctor and wanted to be sure it was ok for pt to get the dilaudid too, Latoya paged Dr. Alease Frame and Elwin Sleight spoke with the pharmacy, pt was waiting there for the rx, told them we have to wait to hear from [facility].

## 2015-01-05 NOTE — Telephone Encounter (Signed)
Spoke with Juliann Pulse from [facility] to clarify issue with Dilaudid being filled as she takes Suboxone. I informed her we needed to contact her PCP first to make sure he is aware that pt is taking Dilaudid and Suboxone as Dr. Gwendlyn Deutscher wrote prescription for Dilaudid #20 (enough to last pt until she sees her PCP). I paged Dr. Alease Frame but realized he is on vacation so Jazmin, CMA went to ask Dr. Gwendlyn Deutscher about this since she wrote prescription for #20 and she stated to cancel Rx and FU with PCP about this. Called [facility]n Pulse at [facility] back and told her to cancel Rx but she stated pt was frustrated and left with prescription and so it was not filled and pt speech seemed slurred and that her PCP was aware of her taking both meds at the same time. Will send this msg to PCP to FU with pt. Xian Apostol, CMA.

## 2015-01-05 NOTE — Telephone Encounter (Signed)
Pt walked into clinic to return the original Rx for Dilaudid.  Dr. Gwendlyn Deutscher wrote for #20 until PCP can prescribe another prescription.  Derl Barrow, RN

## 2015-01-05 NOTE — Telephone Encounter (Signed)
Spoke with Monica [name] regarding office note of 01/02/15 with Monica [name].  According to that note physician refused to give rx for Dilaudin while patient is taking Suboxone.  Informed her of this and that she will need to discuss with Monica [name] about changing back to Dilaudin.  Pt claimed that Monica [name] said it would be okay for her to take both and her primary can prescribed.  Again informed her of exactly what was written in note and that she would have to get Monica [name] to contact provider to inform.  Will relay all this to Dr. Alease [name].  Monica [name] want provider to contact her first.

## 2015-01-05 NOTE — Telephone Encounter (Signed)
Patient recently given refill by PCP by was denied. I gave few days refill. She is to see her PCP soon for refill.

## 2015-01-05 NOTE — Telephone Encounter (Signed)
Pt received rx for dilaudid, says the doctors name was missing from the top of the rx so the pharmacy would not fill it, pt does not have transportation so wants to know if we can fax it

## 2015-01-05 NOTE — Telephone Encounter (Signed)
I called patient back and spoke with patient about her medication, and I reiterated the fact that she should not be on Suboxone and Dilaudid together. She stated her PCP said she can as well as the cancer doctor. I advised her to return the script she has to the clinic, she stated she can do that next week since she lives in [place]. Patient stated she has cancer and is in pain, she has few months to leave. I do understand this but we need to get her the best care. Patient verbalized understanding of the reason why she should not be on both Suboxone and Dilaudid and agreed to schedule follow up with her PCP and Cancer doctor to discuss her pain management. Again I advised her not to fill her Dilaudid and she agreed with plan. She will call the office next week to see her PCP.

## 2015-01-05 NOTE — Telephone Encounter (Signed)
Advised patient that the Rx for Dilaudid can not be fax or mailed.  Pt would have to come and pick it up and to bring in the old one.  Will forward to PCP that pharmacist at [facility] stated that the original Rx did not have a printed provider name or address so the Rx could not be filled.  Please rewrite Rx for Dilaudid and patient will pick up new Rx in a couple of days when she get transportation.  Derl Barrow, RN

## 2015-01-05 NOTE — Telephone Encounter (Signed)
Pt says she is returning Tamika's call

## 2015-01-05 NOTE — Telephone Encounter (Signed)
I called pharmacy earlier and advised them to cancel script as I was not previously aware she continues to take Saboxone. The pharmacy confirmed cancellation. I was later made aware that script was given to her to take home. Patient called back upset about refill. She spoke with Pamala Hurry. Pamala Hurry was advised to let patient know she should not be on Saboxone and Dilaudid,this was clearly stated to patient and she was made aware she should not be on both and she is not to fill her script.

## 2015-01-08 NOTE — Telephone Encounter (Signed)
I was copied on this phone call as residency director. 1. I agree that it does not make pharmacologic sense to treat with suboxone (for opiate addiction) and at the same time prescribe opiates. 2. The situation strikes me as high risk.  Patient did not volunteer that she was taking suboxone.  She also gets suboxone from another prescriber.  The pharmacist comment about patient having slurred speech is also a red flag.   3. I am sympathetic to her widely metastatic colon cancer.  I have no doubt that she has pain. 4. While I am sympathetic, I would not prescribe narcotic pain meds through this office.  Rather, I believe we should get expert help from pain management on this unfortunate, but high risk patient.

## 2015-01-09 NOTE — Telephone Encounter (Signed)
Dr. Gwendlyn Deutscher discussed this patient and these issues with me. Frm my standpoint as Market [facility], I agree  That we should not be prescribing any scheduled medications to a patient who is concurrently taking suboxone.  I am going to note this in her chart. I agree with D. Hensel's comments entirely.

## 2015-01-09 NOTE — Telephone Encounter (Signed)
I tried contacting patient to attempt to close this communication loop. I had to leave a brief message asking that she call our office back.   I will inform her of the decision to discontinue any narcotic prescriptions from our clinic, and will reassess from that point.   If necessary I will contact Dr. Alvy Bimler to fill in any gaps. Otherwise I will send him/his office a message.

## 2015-01-15 ENCOUNTER — Ambulatory Visit (HOSPITAL_BASED_OUTPATIENT_CLINIC_OR_DEPARTMENT_OTHER): Payer: Medicaid Other

## 2015-01-15 ENCOUNTER — Ambulatory Visit: Payer: Medicaid Other

## 2015-01-15 ENCOUNTER — Other Ambulatory Visit (HOSPITAL_BASED_OUTPATIENT_CLINIC_OR_DEPARTMENT_OTHER): Payer: Medicaid Other

## 2015-01-15 ENCOUNTER — Ambulatory Visit: Payer: Self-pay | Admitting: Family Medicine

## 2015-01-15 VITALS — BP 130/76 | HR 68 | Temp 98.0°F | Resp 18

## 2015-01-15 DIAGNOSIS — C189 Malignant neoplasm of colon, unspecified: Secondary | ICD-10-CM

## 2015-01-15 DIAGNOSIS — C787 Secondary malignant neoplasm of liver and intrahepatic bile duct: Secondary | ICD-10-CM

## 2015-01-15 DIAGNOSIS — Z5111 Encounter for antineoplastic chemotherapy: Secondary | ICD-10-CM | POA: Diagnosis not present

## 2015-01-15 DIAGNOSIS — C786 Secondary malignant neoplasm of retroperitoneum and peritoneum: Secondary | ICD-10-CM | POA: Diagnosis not present

## 2015-01-15 DIAGNOSIS — Z95828 Presence of other vascular implants and grafts: Secondary | ICD-10-CM

## 2015-01-15 DIAGNOSIS — Z5112 Encounter for antineoplastic immunotherapy: Secondary | ICD-10-CM | POA: Diagnosis not present

## 2015-01-15 DIAGNOSIS — C182 Malignant neoplasm of ascending colon: Secondary | ICD-10-CM | POA: Diagnosis present

## 2015-01-15 LAB — CBC WITH DIFFERENTIAL/PLATELET
BASO%: 0.2 % (ref 0.0–2.0)
Basophils Absolute: 0 10*3/uL (ref 0.0–0.1)
EOS%: 0.8 % (ref 0.0–7.0)
Eosinophils Absolute: 0.1 10*3/uL (ref 0.0–0.5)
HCT: 34.2 % — ABNORMAL LOW (ref 34.8–46.6)
HGB: 11.3 g/dL — ABNORMAL LOW (ref 11.6–15.9)
LYMPH#: 0.8 10*3/uL — AB (ref 0.9–3.3)
LYMPH%: 12.1 % — ABNORMAL LOW (ref 14.0–49.7)
MCH: 28.9 pg (ref 25.1–34.0)
MCHC: 33 g/dL (ref 31.5–36.0)
MCV: 87.5 fL (ref 79.5–101.0)
MONO#: 0.6 10*3/uL (ref 0.1–0.9)
MONO%: 8.9 % (ref 0.0–14.0)
NEUT#: 4.8 10*3/uL (ref 1.5–6.5)
NEUT%: 78 % — ABNORMAL HIGH (ref 38.4–76.8)
Platelets: 121 10*3/uL — ABNORMAL LOW (ref 145–400)
RBC: 3.91 10*6/uL (ref 3.70–5.45)
RDW: 16.1 % — AB (ref 11.2–14.5)
WBC: 6.2 10*3/uL (ref 3.9–10.3)

## 2015-01-15 LAB — COMPREHENSIVE METABOLIC PANEL (CC13)
ALBUMIN: 2.7 g/dL — AB (ref 3.5–5.0)
ALT: 13 U/L (ref 0–55)
ANION GAP: 12 meq/L — AB (ref 3–11)
AST: 22 U/L (ref 5–34)
Alkaline Phosphatase: 116 U/L (ref 40–150)
BILIRUBIN TOTAL: 0.36 mg/dL (ref 0.20–1.20)
BUN: 10.4 mg/dL (ref 7.0–26.0)
CO2: 29 meq/L (ref 22–29)
Calcium: 9.1 mg/dL (ref 8.4–10.4)
Chloride: 101 mEq/L (ref 98–109)
Creatinine: 1.2 mg/dL — ABNORMAL HIGH (ref 0.6–1.1)
EGFR: 54 mL/min/{1.73_m2} — ABNORMAL LOW (ref >90)
Glucose: 206 mg/dl — ABNORMAL HIGH (ref 70–140)
POTASSIUM: 3.4 meq/L — AB (ref 3.5–5.1)
SODIUM: 143 meq/L (ref 136–145)
Total Protein: 6.9 g/dL (ref 6.4–8.3)

## 2015-01-15 LAB — UA PROTEIN, DIPSTICK - CHCC: Protein, ur: NEGATIVE mg/dL

## 2015-01-15 MED ORDER — HEPARIN SOD (PORK) LOCK FLUSH 100 UNIT/ML IV SOLN
500.0000 [IU] | Freq: Once | INTRAVENOUS | Status: DC
  Filled 2015-01-15: qty 5

## 2015-01-15 MED ORDER — SODIUM CHLORIDE 0.9 % IV SOLN
5.0000 mg/kg | Freq: Once | INTRAVENOUS | Status: AC
  Administered 2015-01-15: 375 mg via INTRAVENOUS
  Filled 2015-01-15: qty 15

## 2015-01-15 MED ORDER — SODIUM CHLORIDE 0.9 % IV SOLN
Freq: Once | INTRAVENOUS | Status: AC
  Administered 2015-01-15: 12:00:00 via INTRAVENOUS

## 2015-01-15 MED ORDER — LEUCOVORIN CALCIUM INJECTION 350 MG
700.0000 mg | Freq: Once | INTRAVENOUS | Status: AC
  Administered 2015-01-15: 700 mg via INTRAVENOUS
  Filled 2015-01-15: qty 35

## 2015-01-15 MED ORDER — OXALIPLATIN CHEMO INJECTION 100 MG/20ML
42.5000 mg/m2 | Freq: Once | INTRAVENOUS | Status: AC
  Administered 2015-01-15: 75 mg via INTRAVENOUS
  Filled 2015-01-15: qty 15

## 2015-01-15 MED ORDER — DEXTROSE 5 % IV SOLN
Freq: Once | INTRAVENOUS | Status: AC
  Administered 2015-01-15: 11:00:00 via INTRAVENOUS

## 2015-01-15 MED ORDER — SODIUM CHLORIDE 0.9 % IV SOLN
Freq: Once | INTRAVENOUS | Status: AC
  Administered 2015-01-15: 12:00:00 via INTRAVENOUS
  Filled 2015-01-15: qty 4

## 2015-01-15 MED ORDER — SODIUM CHLORIDE 0.9 % IV SOLN
2400.0000 mg/m2 | INTRAVENOUS | Status: DC
  Administered 2015-01-15: 4150 mg via INTRAVENOUS
  Filled 2015-01-15: qty 83

## 2015-01-15 MED ORDER — SODIUM CHLORIDE 0.9 % IJ SOLN
10.0000 mL | INTRAMUSCULAR | Status: DC | PRN
  Administered 2015-01-15: 10 mL via INTRAVENOUS
  Filled 2015-01-15: qty 10

## 2015-01-15 NOTE — Patient Instructions (Signed)
[facility] Discharge Instructions for Patients Receiving Chemotherapy  Today you received the following chemotherapy agents Avastin/Leucovorin/Oxaliplatin/5-FU.  To help prevent nausea and vomiting after your treatment, we encourage you to take your nausea medication as directed.    If you develop nausea and vomiting that is not controlled by your nausea medication, call the clinic.   BELOW ARE SYMPTOMS THAT SHOULD BE REPORTED IMMEDIATELY:  *FEVER GREATER THAN 100.5 F  *CHILLS WITH OR WITHOUT FEVER  NAUSEA AND VOMITING THAT IS NOT CONTROLLED WITH YOUR NAUSEA MEDICATION  *UNUSUAL SHORTNESS OF BREATH  *UNUSUAL BRUISING OR BLEEDING  TENDERNESS IN MOUTH AND THROAT WITH OR WITHOUT PRESENCE OF ULCERS  *URINARY PROBLEMS  *BOWEL PROBLEMS  UNUSUAL RASH Items with * indicate a potential emergency and should be followed up as soon as possible.  Feel free to call the clinic you have any questions or concerns. The clinic phone number is [phone].  Please show the [facility] at check-in to the Emergency Department and triage nurse.

## 2015-01-15 NOTE — Patient Instructions (Signed)

## 2015-01-16 ENCOUNTER — Other Ambulatory Visit: Payer: Self-pay

## 2015-01-16 LAB — CEA: CEA: 45.9 ng/mL — ABNORMAL HIGH (ref 0.0–5.0)

## 2015-01-17 ENCOUNTER — Ambulatory Visit (HOSPITAL_BASED_OUTPATIENT_CLINIC_OR_DEPARTMENT_OTHER): Payer: Medicaid Other

## 2015-01-17 ENCOUNTER — Ambulatory Visit (INDEPENDENT_AMBULATORY_CARE_PROVIDER_SITE_OTHER): Payer: Medicaid Other | Admitting: Family Medicine

## 2015-01-17 ENCOUNTER — Encounter: Payer: Self-pay | Admitting: Family Medicine

## 2015-01-17 VITALS — BP 137/78 | HR 81 | Temp 98.2°F | Wt 150.0 lb

## 2015-01-17 DIAGNOSIS — C189 Malignant neoplasm of colon, unspecified: Secondary | ICD-10-CM

## 2015-01-17 DIAGNOSIS — C786 Secondary malignant neoplasm of retroperitoneum and peritoneum: Secondary | ICD-10-CM | POA: Diagnosis not present

## 2015-01-17 DIAGNOSIS — F1120 Opioid dependence, uncomplicated: Secondary | ICD-10-CM

## 2015-01-17 DIAGNOSIS — G894 Chronic pain syndrome: Secondary | ICD-10-CM | POA: Diagnosis present

## 2015-01-17 DIAGNOSIS — C787 Secondary malignant neoplasm of liver and intrahepatic bile duct: Secondary | ICD-10-CM

## 2015-01-17 DIAGNOSIS — C182 Malignant neoplasm of ascending colon: Secondary | ICD-10-CM | POA: Diagnosis not present

## 2015-01-17 MED ORDER — SODIUM CHLORIDE 0.9 % IJ SOLN
10.0000 mL | INTRAMUSCULAR | Status: DC | PRN
  Administered 2015-01-17: 10 mL
  Filled 2015-01-17: qty 10

## 2015-01-17 MED ORDER — HEPARIN SOD (PORK) LOCK FLUSH 100 UNIT/ML IV SOLN
500.0000 [IU] | Freq: Once | INTRAVENOUS | Status: AC | PRN
  Administered 2015-01-17: 500 [IU]
  Filled 2015-01-17: qty 5

## 2015-01-17 NOTE — Progress Notes (Addendum)
   HPI  CC: med refill  Patient is here for a medication refill. Patient has been experiencing pain across her abdomen. This pain is unchanged from before. Can be sharp in nature w/o radiation. Patient was informed that she will be receiving no additional narcotics from this office. She is currently receiving Suboxone from a Dr. Franchot Mimes in [place]. Patient was upset that we would no longer be prescribing these for her. We had a long discussion about her pain and the control she receives from the suboxone.   Patient brought up the possibility of getting drugs "of the street" for her pain. This was both alarming and met with immediate encouragement to AVOID at all costs.   Patient was joined by her friend Monica [name] who patient stated is one of 2 people she has allowed to be witness to her medical care (the 2nd being her father, Monica [name]).    ROS: denies HA, dizziness, confusion, falls, fever, chills, n/v  Review of Systems   See HPI for ROS. All other systems reviewed and are negative.  Past medical history and social history reviewed and updated in the EMR as appropriate.  Objective: BP 137/78 mmHg  Pulse 81  Temp(Src) 98.2 F (36.8 C) (Oral)  Wt 150 lb (68.04 kg) Gen: NAD, alert, cooperative, and somewhat flat affect.  HEENT: NCAT, EOMI CV: RRR, no murmur Resp: CTAB, non-labored Abd: tenderness at the RLQ and LLQ, BS present, no guarding or organomegaly Ext: No edema, warm Neuro: Alert and oriented, Speech clear, No gross deficits  Assessment and plan:  Chronic pain disorder Patient will no longer be receiving pain medication through our office. Patient receiving Suboxone from an outside prescriber--b/c of this we will keep from complicating this matter. Patient reports a significant amount of breakthrough pain.  I was able to discuss her case over the phone w/ Dr. Franchot Mimes -- he is now aware that patient will no longer be receiving any pain medication from our office.  We discussed the fact that she will likely be transitioning to being a "pain patient" due to her terminal cancer. I informed him that if he eventually has to stop his treatment, then she will need to receive her pain control from either a pain clinic or the Oncology team--as my supervisors have stressed, no more narcotics from this practice.    Elberta Leatherwood, MD,MS,  PGY1 01/17/2015 5:49 PM

## 2015-01-17 NOTE — Assessment & Plan Note (Addendum)
Patient will no longer be receiving pain medication through our office. Patient receiving Suboxone from an outside prescriber--b/c of this we will keep from complicating this matter. Patient reports a significant amount of breakthrough pain.  I was able to discuss her case over the phone w/ Dr. Franchot Mimes -- he is now aware that patient will no longer be receiving any pain medication from our office. We discussed the fact that she will likely be transitioning to being a "pain patient" due to her terminal cancer. I informed him that if he eventually has to stop his treatment, then she will need to receive her pain control from either a pain clinic or the Oncology team--as my supervisors have stressed, no more narcotics from this practice.

## 2015-01-17 NOTE — Patient Instructions (Signed)
It was a pleasure seeing you today in our clinic. Today we discussed your pain control. Here is the treatment plan we have discussed and agreed upon together:   - It is unfortunate that I was unable to provide any solutions to your pain today. As we discussed, I believe it is very important that you discuss you pain with Dr. Franchot Mimes. As the physician prescribing your Suboxone he should be the one who manages your pain. Please discuss with him the fact that you have fairly significant Breakthrough Pain, and whether or not he has any suggestions for this.  - You mentioned the idea of getting pain medications "off the street". I strongly recommend against this action. Putting legal consequences aside, medications from the street are not always what they are said to be. Also, these medications can have serious side effects and interactions with the medications you are already prescribed. Taking any medications that has not been prescribed to you can be extremely dangerous.

## 2015-01-26 ENCOUNTER — Other Ambulatory Visit (HOSPITAL_BASED_OUTPATIENT_CLINIC_OR_DEPARTMENT_OTHER): Payer: Medicaid Other

## 2015-01-26 ENCOUNTER — Ambulatory Visit (HOSPITAL_COMMUNITY)
Admission: RE | Admit: 2015-01-26 | Discharge: 2015-01-26 | Disposition: A | Discharge status: Home / Self Care | Payer: Medicaid Other | Source: Ambulatory Visit | Attending: Hematology and Oncology | Admitting: Hematology and Oncology

## 2015-01-26 DIAGNOSIS — Z08 Encounter for follow-up examination after completed treatment for malignant neoplasm: Secondary | ICD-10-CM | POA: Insufficient documentation

## 2015-01-26 DIAGNOSIS — C189 Malignant neoplasm of colon, unspecified: Secondary | ICD-10-CM | POA: Diagnosis not present

## 2015-01-26 DIAGNOSIS — C7802 Secondary malignant neoplasm of left lung: Secondary | ICD-10-CM | POA: Insufficient documentation

## 2015-01-26 DIAGNOSIS — C7800 Secondary malignant neoplasm of unspecified lung: Secondary | ICD-10-CM | POA: Insufficient documentation

## 2015-01-26 DIAGNOSIS — B1920 Unspecified viral hepatitis C without hepatic coma: Secondary | ICD-10-CM | POA: Insufficient documentation

## 2015-01-26 DIAGNOSIS — C779 Secondary and unspecified malignant neoplasm of lymph node, unspecified: Secondary | ICD-10-CM | POA: Diagnosis not present

## 2015-01-26 DIAGNOSIS — C7801 Secondary malignant neoplasm of right lung: Secondary | ICD-10-CM | POA: Diagnosis not present

## 2015-01-26 DIAGNOSIS — Z85038 Personal history of other malignant neoplasm of large intestine: Secondary | ICD-10-CM | POA: Diagnosis not present

## 2015-01-26 DIAGNOSIS — Z9221 Personal history of antineoplastic chemotherapy: Secondary | ICD-10-CM | POA: Diagnosis not present

## 2015-01-26 DIAGNOSIS — C787 Secondary malignant neoplasm of liver and intrahepatic bile duct: Secondary | ICD-10-CM | POA: Diagnosis not present

## 2015-01-26 DIAGNOSIS — N1330 Unspecified hydronephrosis: Secondary | ICD-10-CM | POA: Diagnosis not present

## 2015-01-26 LAB — CBC WITH DIFFERENTIAL/PLATELET
BASO%: 0.9 % (ref 0.0–2.0)
Basophils Absolute: 0 10*3/uL (ref 0.0–0.1)
EOS%: 0.9 % (ref 0.0–7.0)
Eosinophils Absolute: 0 10*3/uL (ref 0.0–0.5)
HCT: 38.7 % (ref 34.8–46.6)
HEMOGLOBIN: 12.8 g/dL (ref 11.6–15.9)
LYMPH%: 16.2 % (ref 14.0–49.7)
MCH: 28.9 pg (ref 25.1–34.0)
MCHC: 33.1 g/dL (ref 31.5–36.0)
MCV: 87.4 fL (ref 79.5–101.0)
MONO#: 0.6 10*3/uL (ref 0.1–0.9)
MONO%: 11.3 % (ref 0.0–14.0)
NEUT%: 70.7 % (ref 38.4–76.8)
NEUTROS ABS: 3.5 10*3/uL (ref 1.5–6.5)
PLATELETS: 224 10*3/uL (ref 145–400)
RBC: 4.43 10*6/uL (ref 3.70–5.45)
RDW: 17 % — AB (ref 11.2–14.5)
WBC: 4.9 10*3/uL (ref 3.9–10.3)
lymph#: 0.8 10*3/uL — ABNORMAL LOW (ref 0.9–3.3)

## 2015-01-26 LAB — COMPREHENSIVE METABOLIC PANEL (CC13)
ALBUMIN: 3.4 g/dL — AB (ref 3.5–5.0)
ALT: 17 U/L (ref 0–55)
AST: 31 U/L (ref 5–34)
Alkaline Phosphatase: 126 U/L (ref 40–150)
Anion Gap: 11 mEq/L (ref 3–11)
BUN: 11 mg/dL (ref 7.0–26.0)
CALCIUM: 10 mg/dL (ref 8.4–10.4)
CHLORIDE: 97 meq/L — AB (ref 98–109)
CO2: 33 mEq/L — ABNORMAL HIGH (ref 22–29)
Creatinine: 1.1 mg/dL (ref 0.6–1.1)
EGFR: 55 mL/min/{1.73_m2} — ABNORMAL LOW (ref >90)
Glucose: 85 mg/dl (ref 70–140)
POTASSIUM: 4.1 meq/L (ref 3.5–5.1)
Sodium: 140 mEq/L (ref 136–145)
Total Bilirubin: 0.41 mg/dL (ref 0.20–1.20)
Total Protein: 7.9 g/dL (ref 6.4–8.3)

## 2015-01-26 LAB — UA PROTEIN, DIPSTICK - CHCC: Protein, ur: NEGATIVE mg/dL

## 2015-01-26 MED ORDER — IOHEXOL 300 MG/ML  SOLN
100.0000 mL | Freq: Once | INTRAMUSCULAR | Status: AC | PRN
  Administered 2015-01-26: 100 mL via INTRAVENOUS

## 2015-01-29 ENCOUNTER — Ambulatory Visit (HOSPITAL_BASED_OUTPATIENT_CLINIC_OR_DEPARTMENT_OTHER): Payer: Medicaid Other | Admitting: Hematology and Oncology

## 2015-01-29 ENCOUNTER — Telehealth: Payer: Self-pay | Admitting: *Deleted

## 2015-01-29 ENCOUNTER — Telehealth: Payer: Self-pay | Admitting: Hematology and Oncology

## 2015-01-29 ENCOUNTER — Encounter: Payer: Self-pay | Admitting: Hematology and Oncology

## 2015-01-29 ENCOUNTER — Ambulatory Visit: Payer: Medicaid Other

## 2015-01-29 VITALS — BP 147/84 | HR 86 | Temp 97.4°F | Resp 17 | Ht 65.0 in | Wt 149.1 lb

## 2015-01-29 DIAGNOSIS — N1330 Unspecified hydronephrosis: Secondary | ICD-10-CM

## 2015-01-29 DIAGNOSIS — C182 Malignant neoplasm of ascending colon: Secondary | ICD-10-CM

## 2015-01-29 DIAGNOSIS — G622 Polyneuropathy due to other toxic agents: Secondary | ICD-10-CM | POA: Diagnosis not present

## 2015-01-29 DIAGNOSIS — C189 Malignant neoplasm of colon, unspecified: Secondary | ICD-10-CM

## 2015-01-29 DIAGNOSIS — C787 Secondary malignant neoplasm of liver and intrahepatic bile duct: Secondary | ICD-10-CM

## 2015-01-29 DIAGNOSIS — G893 Neoplasm related pain (acute) (chronic): Secondary | ICD-10-CM | POA: Diagnosis not present

## 2015-01-29 DIAGNOSIS — G894 Chronic pain syndrome: Secondary | ICD-10-CM | POA: Diagnosis not present

## 2015-01-29 DIAGNOSIS — C786 Secondary malignant neoplasm of retroperitoneum and peritoneum: Secondary | ICD-10-CM | POA: Diagnosis not present

## 2015-01-29 NOTE — Assessment & Plan Note (Signed)
She has chronic pain disorder and cancer related pain. The patient is also taking Suboxone. I reinforced narcotic refill policy with the patient. I do not think it makes sense for her to be on 2 different pain medicine and I would not prescribe Dilaudid anymore as she is on Suboxone. The patient also appeared to be heavily sedated during all her treatment.

## 2015-01-29 NOTE — Telephone Encounter (Signed)
Called patient regarding the urology referral and she will call her pcp for the referral due to her medicaid

## 2015-01-29 NOTE — Assessment & Plan Note (Signed)
Unfortunately, CT scan shows significant disease progression. I reviewed with her current NCCN guidelines. I recommend Irinotecan and Aflibercept every 2 weeks. The risks, benefit, side effects of treatment is discussed with the patient and she agreed to proceed. I plan to schedule this to start next week to give time for urology consultation to see if stenting is appropriate for bilateral hydronephrosis.

## 2015-01-29 NOTE — Telephone Encounter (Signed)
To PCP: please disregard, we fixed it

## 2015-01-29 NOTE — Telephone Encounter (Signed)
Appointments made and avs pritned for patient °

## 2015-01-29 NOTE — Assessment & Plan Note (Signed)
She has evidence of hydronephrosis. I recommend urgent neurology consult for possible stenting if indicated.

## 2015-01-29 NOTE — Telephone Encounter (Signed)
Per staff message and POF I have scheduled appts. Advised scheduler of appts. JMW  

## 2015-01-29 NOTE — Telephone Encounter (Signed)
Received message for [facility] that pt's need a referral to Urology ASAP for hydronephrosis.  Pt has medicaid and the PCP has to place the referral per [facility].  Dr. Alvy Bimler would like the patient to start chemotherapy on February 09, 2015.  Please give her a call at [phone] with questions. Derl Barrow, RN

## 2015-01-29 NOTE — Progress Notes (Signed)
[name] OFFICE PROGRESS NOTE  Patient Care Team: Elberta Leatherwood, MD as PCP - General Fleet Contras, MD as Consulting Physician (Nephrology) Leatrice Jewels Rayetta Pigg, MD as Consulting Physician (Psychiatry) Heath Lark, MD as Consulting Physician (Hematology and Oncology)  SUMMARY OF ONCOLOGIC HISTORY: Oncology History   Colon cancer, KRAS positive   Primary site: Colon and Rectum (Right)   Staging method: AJCC 7th Edition   Clinical: (T4b, N2b, M1) signed by Heath Lark, MD on 08/05/2013  9:52 AM   Pathologic: Stage IVB (T4b, N2b, M1b) signed by Heath Lark, MD on 08/05/2013 10:21 AM   Summary: Stage IVB (T4b, N2b, M1b)      Colon cancer   07/15/2013 - 07/25/2013 Hospital Admission The patient presented to the hospital with pain in her abdomen and underwent surgery for colon cancer   07/15/2013 Imaging CT scan of the abdomen show bowel dilatation suspicious for malignancy   07/17/2013 Tumor Marker Preoperative CEA was 2.2, normal   07/19/2013 Procedure Colonoscopy revealed ascending colon lesion with significant stricture and biopsy showed adenocarcinoma   07/20/2013 Surgery She underwent right hemicolectomy, cholecystectomy, liver biopsy, excision of abdominal wall mass and omental mass. During surgery, she was also found to have drop metastasis   08/29/2013 Imaging Repeat CT scan show persistent liver metastasis and new omental metastasis   08/31/2013 - 02/10/2014 Chemotherapy The patient was started on palliative chemotherapy with FOLFOX, bolus 5-FU committed and oxaliplatin dose reduced due to  peripheral neuropathy. She has completed 12 cycles of treatment.   11/22/2013 Imaging Repeat CT scan showed near complete response to treatment.   11/23/2013 - 02/10/2014 Chemotherapy Avastin was added to her chemotherapy regimen   12/21/2013 Adverse Reaction Oxaliplatin was omitted due to worsening peripheral neuropathy.   02/20/2014 Imaging Repeat CT scan showed near complete resolution  of all disease.   05/15/2014 Imaging Repeat CT scan showed peritoneal metastasis and recurrence of disease   05/22/2014 -  Chemotherapy Modify dosings of FOLFOX was restarted.   07/14/2014 Imaging CT scan of the abdomen and pelvis show positive response to treatment.   09/07/2014 Imaging Repeat CT scan of the chest, abdomen and pelvis shows stable disease.   09/08/2014 Adverse Reaction She will continue treatment without oxaliplatin due to severe peripheral neuropathy.   11/30/2014 Imaging Ct scan showed significant disease progression   01/29/2015 Imaging CT scan showed significant disease progression    INTERVAL HISTORY: Please see below for problem oriented charting. She returns today with her caregivers to review test results. She continues to have lower abdominal pain. She continues to have problem with chronic constipation. Denies nausea or vomiting. Peripheral neuropathy is a little worse.  REVIEW OF SYSTEMS:   Constitutional: Denies fevers, chills or abnormal weight loss Eyes: Denies blurriness of vision Ears, nose, mouth, throat, and face: Denies mucositis or sore throat Respiratory: Denies cough, dyspnea or wheezes Cardiovascular: Denies palpitation, chest discomfort or lower extremity swelling Skin: Denies abnormal skin rashes Lymphatics: Denies new lymphadenopathy or easy bruising Neurological:Denies numbness, tingling or new weaknesses Behavioral/Psych: Mood is stable, no new changes  All other systems were reviewed with the patient and are negative.  I have reviewed the past medical history, past surgical history, social history and family history with the patient and they are unchanged from previous note.  ALLERGIES:  is allergic to naproxen.  MEDICATIONS:  Current Outpatient Prescriptions  Medication Sig Dispense Refill  . Buprenorphine HCl-Naloxone HCl (SUBOXONE SL) Place 8 mg under the tongue 3 (three) times daily.    Marland Kitchen  clonazePAM (KLONOPIN) 1 MG tablet Take 1 mg by  mouth 3 (three) times daily.     . diazepam (VALIUM) 10 MG tablet Take 10 mg by mouth every 8 (eight) hours as needed for anxiety.    . furosemide (LASIX) 40 MG tablet Take 1 tablet (40 mg total) by mouth 2 (two) times daily. 60 tablet 5  . gabapentin (NEURONTIN) 800 MG tablet Take 1 tablet (800 mg total) by mouth 3 (three) times daily. 180 tablet 3  . glucose blood (ACCU-CHEK AVIVA PLUS) test strip USE TO CHECK BLOOD SUGAR EVERY DAY UP TO 4 TIMES. 100 each 10  . HYDROmorphone (DILAUDID) 4 MG tablet TAKE 1 TABLET BY MOUTH EVERY 4 HOURS AS NEEDED FOR SEVERE PAIN  0  . hydrOXYzine (ATARAX/VISTARIL) 50 MG tablet Take 25 mg by mouth 3 (three) times daily.    . insulin lispro (HUMALOG) 100 UNIT/ML injection Inject 1-10 Units into the skin 3 (three) times daily before meals.    . lamoTRIgine (LAMICTAL) 100 MG tablet Take 200 mg by mouth every morning.     Marland Kitchen LANTUS 100 UNIT/ML injection INJECT 27 UNITS into SKIN EVERY DAY 10 mL 3  . lidocaine-prilocaine (EMLA) cream Apply 1 application topically as needed (for pain). Apply to port a cath site one hour prior to needle stick. 30 g 2  . omeprazole (PRILOSEC) 20 MG capsule Take 20 mg by mouth every morning.    . polyethylene glycol powder (GLYCOLAX/MIRALAX) powder Take 17 g by mouth 2 (two) times daily as needed. (Patient taking differently: Take 17 g by mouth 2 (two) times daily as needed for mild constipation. ) 3350 g 3  . senna-docusate (SENOKOT-S) 8.6-50 MG per tablet Take 1 tablet by mouth 2 (two) times daily. 90 tablet 3  . ziprasidone (GEODON) 80 MG capsule Take 160 mg by mouth at bedtime.     Marland Kitchen atorvastatin (LIPITOR) 40 MG tablet Take 1 tablet (40 mg total) by mouth daily. (Patient not taking: Reported on 01/29/2015) 30 tablet 5  . ROZEREM 8 MG tablet Take 8 mg by mouth at bedtime.  0   No current facility-administered medications for this visit.   Facility-Administered Medications Ordered in Other Visits  Medication Dose Route Frequency Provider  Last Rate Last Dose  . sodium chloride 0.9 % injection 10 mL  10 mL Intravenous PRN Heath Lark, MD   10 mL at 09/25/14 0913    PHYSICAL EXAMINATION: ECOG PERFORMANCE STATUS: 1 - Symptomatic but completely ambulatory  Filed Vitals:   01/29/15 1015  BP: 147/84  Pulse: 86  Temp: 97.4 F (36.3 C)  Resp: 17   Filed Weights   01/29/15 1015  Weight: 149 lb 1.6 oz (67.631 kg)    GENERAL:alert, no distress and comfortable SKIN: skin color, texture, turgor are normal, no rashes or significant lesions EYES: normal, Conjunctiva are pink and non-injected, sclera clear ABDOMEN:abdomen is distended. Musculoskeletal:no cyanosis of digits and no clubbing  NEURO: alert & oriented x 3 with fluent speech, no focal motor/sensory deficits  LABORATORY DATA:  I have reviewed the data as listed    Component Value Date/Time   NA 140 01/26/2015 1000   NA 140 11/08/2014 1209   K 4.1 01/26/2015 1000   K 3.7 11/08/2014 1209   CL 98 11/08/2014 1209   CO2 33* 01/26/2015 1000   CO2 29 11/08/2014 1209   GLUCOSE 85 01/26/2015 1000   GLUCOSE 201* 11/08/2014 1209   BUN 11.0 01/26/2015 1000   BUN 15  11/08/2014 1209   CREATININE 1.1 01/26/2015 1000   CREATININE 1.11* 11/08/2014 1209   CREATININE 1.38* 05/26/2013 1703   CALCIUM 10.0 01/26/2015 1000   CALCIUM 9.1 11/08/2014 1209   PROT 7.9 01/26/2015 1000   PROT 7.1 05/14/2014 2233   ALBUMIN 3.4* 01/26/2015 1000   ALBUMIN 3.1* 05/14/2014 2233   AST 31 01/26/2015 1000   AST 28 05/14/2014 2233   ALT 17 01/26/2015 1000   ALT 18 05/14/2014 2233   ALKPHOS 126 01/26/2015 1000   ALKPHOS 124* 05/14/2014 2233   BILITOT 0.41 01/26/2015 1000   BILITOT 0.2* 05/14/2014 2233   GFRNONAA 56* 11/08/2014 1209   GFRNONAA 44* 05/26/2013 1703   GFRAA 65* 11/08/2014 1209   GFRAA 51* 05/26/2013 1703    No results found for: SPEP, UPEP  Lab Results  Component Value Date   WBC 4.9 01/26/2015   NEUTROABS 3.5 01/26/2015   HGB 12.8 01/26/2015   HCT 38.7  01/26/2015   MCV 87.4 01/26/2015   PLT 224 01/26/2015      Chemistry      Component Value Date/Time   NA 140 01/26/2015 1000   NA 140 11/08/2014 1209   K 4.1 01/26/2015 1000   K 3.7 11/08/2014 1209   CL 98 11/08/2014 1209   CO2 33* 01/26/2015 1000   CO2 29 11/08/2014 1209   BUN 11.0 01/26/2015 1000   BUN 15 11/08/2014 1209   CREATININE 1.1 01/26/2015 1000   CREATININE 1.11* 11/08/2014 1209   CREATININE 1.38* 05/26/2013 1703      Component Value Date/Time   CALCIUM 10.0 01/26/2015 1000   CALCIUM 9.1 11/08/2014 1209   ALKPHOS 126 01/26/2015 1000   ALKPHOS 124* 05/14/2014 2233   AST 31 01/26/2015 1000   AST 28 05/14/2014 2233   ALT 17 01/26/2015 1000   ALT 18 05/14/2014 2233   BILITOT 0.41 01/26/2015 1000   BILITOT 0.2* 05/14/2014 2233       RADIOGRAPHIC STUDIES: I reviewed the CT scan extensively with the patient and family members I have personally reviewed the radiological images as listed and agreed with the findings in the report.  ASSESSMENT & PLAN:  Colon cancer Unfortunately, CT scan shows significant disease progression. I reviewed with her current NCCN guidelines. I recommend Irinotecan and Aflibercept every 2 weeks. The risks, benefit, side effects of treatment is discussed with the patient and she agreed to proceed. I plan to schedule this to start next week to give time for urology consultation to see if stenting is appropriate for bilateral hydronephrosis.  Metastasis to liver She tolerated treatment well. Liver function tests are normal.     Bilateral hydronephrosis She has evidence of hydronephrosis. I recommend urgent neurology consult for possible stenting if indicated.   Chronic pain disorder She has chronic pain disorder and cancer related pain. The patient is also taking Suboxone. I reinforced narcotic refill policy with the patient. I do not think it makes sense for her to be on 2 different pain medicine and I would not prescribe Dilaudid  anymore as she is on Suboxone. The patient also appeared to be heavily sedated during all her treatment.      Orders Placed This Encounter  Procedures  . Ambulatory referral to Urology    Referral Priority:  Urgent    Referral Type:  Consultation    Referral Reason:  Specialty Services Required    Requested Specialty:  Urology    Number of Visits Requested:  1   All questions were  answered. The patient knows to call the clinic with any problems, questions or concerns. No barriers to learning was detected. I spent 30 minutes counseling the patient face to face. The total time spent in the appointment was 40 minutes and more than 50% was on counseling and review of test results     [facility], [place], MD 01/29/2015 2:41 PM

## 2015-01-29 NOTE — Telephone Encounter (Signed)
Pt says she was told by [facility] that we were suppose to call and make the appt and give a confirmation number. Please advise

## 2015-01-29 NOTE — Assessment & Plan Note (Signed)
She tolerated treatment well. Liver function tests are normal.

## 2015-01-29 NOTE — Telephone Encounter (Signed)
Left message on physician line requesting Dr Alease Frame place referral to Urology ASAP for hydronephrosis. Explained that Dr Alvy Bimler wants to start chemo on July 8. To call us if has any questions.

## 2015-01-29 NOTE — Telephone Encounter (Signed)
Patient recently referred to urology by oncology, however, patient has Medicaid and all referrals have to come from PCP's office. Will forward to MD for referral.

## 2015-01-31 NOTE — Telephone Encounter (Signed)
Thank you very much Asha!

## 2015-02-01 ENCOUNTER — Other Ambulatory Visit: Payer: Self-pay | Admitting: Family Medicine

## 2015-02-01 DIAGNOSIS — N1330 Unspecified hydronephrosis: Secondary | ICD-10-CM

## 2015-02-02 ENCOUNTER — Other Ambulatory Visit: Payer: Self-pay | Admitting: Urology

## 2015-02-07 ENCOUNTER — Encounter (HOSPITAL_BASED_OUTPATIENT_CLINIC_OR_DEPARTMENT_OTHER): Payer: Self-pay | Admitting: *Deleted

## 2015-02-08 NOTE — Progress Notes (Addendum)
Unable to complete preop assessment.  Call dropped.  Pt stated on return call by nurse "I'll call you back".  Pt was informed, prior to dropped call, to arrive @ 0600. Not to take any insulin or lasix Monday am.  Pt to take klonopin,neurontin,vistaril,prilosec, lamictal w sip of water.  Will try to complete assessment later.

## 2015-02-09 ENCOUNTER — Other Ambulatory Visit: Payer: Self-pay

## 2015-02-09 ENCOUNTER — Encounter (HOSPITAL_BASED_OUTPATIENT_CLINIC_OR_DEPARTMENT_OTHER): Payer: Self-pay | Admitting: *Deleted

## 2015-02-09 ENCOUNTER — Other Ambulatory Visit (HOSPITAL_BASED_OUTPATIENT_CLINIC_OR_DEPARTMENT_OTHER): Payer: Medicaid Other

## 2015-02-09 ENCOUNTER — Other Ambulatory Visit: Payer: Self-pay | Admitting: Hematology and Oncology

## 2015-02-09 ENCOUNTER — Ambulatory Visit: Payer: Medicaid Other

## 2015-02-09 ENCOUNTER — Ambulatory Visit: Payer: Medicaid Other | Admitting: Nutrition

## 2015-02-09 ENCOUNTER — Ambulatory Visit (HOSPITAL_BASED_OUTPATIENT_CLINIC_OR_DEPARTMENT_OTHER): Payer: Medicaid Other

## 2015-02-09 VITALS — BP 127/81 | HR 92 | Temp 98.5°F

## 2015-02-09 DIAGNOSIS — C787 Secondary malignant neoplasm of liver and intrahepatic bile duct: Secondary | ICD-10-CM

## 2015-02-09 DIAGNOSIS — C786 Secondary malignant neoplasm of retroperitoneum and peritoneum: Secondary | ICD-10-CM

## 2015-02-09 DIAGNOSIS — C182 Malignant neoplasm of ascending colon: Secondary | ICD-10-CM | POA: Diagnosis not present

## 2015-02-09 DIAGNOSIS — C189 Malignant neoplasm of colon, unspecified: Secondary | ICD-10-CM

## 2015-02-09 DIAGNOSIS — Z5111 Encounter for antineoplastic chemotherapy: Secondary | ICD-10-CM | POA: Diagnosis present

## 2015-02-09 DIAGNOSIS — Z95828 Presence of other vascular implants and grafts: Secondary | ICD-10-CM

## 2015-02-09 LAB — CBC WITH DIFFERENTIAL/PLATELET
BASO%: 0.1 % (ref 0.0–2.0)
Basophils Absolute: 0 10*3/uL (ref 0.0–0.1)
EOS ABS: 0 10*3/uL (ref 0.0–0.5)
EOS%: 0.2 % (ref 0.0–7.0)
HEMATOCRIT: 35.5 % (ref 34.8–46.6)
HEMOGLOBIN: 11.7 g/dL (ref 11.6–15.9)
LYMPH#: 0.6 10*3/uL — AB (ref 0.9–3.3)
LYMPH%: 7.2 % — ABNORMAL LOW (ref 14.0–49.7)
MCH: 28.7 pg (ref 25.1–34.0)
MCHC: 33 g/dL (ref 31.5–36.0)
MCV: 87.2 fL (ref 79.5–101.0)
MONO#: 0.8 10*3/uL (ref 0.1–0.9)
MONO%: 9.2 % (ref 0.0–14.0)
NEUT#: 7 10*3/uL — ABNORMAL HIGH (ref 1.5–6.5)
NEUT%: 83.3 % — ABNORMAL HIGH (ref 38.4–76.8)
Platelets: 169 10*3/uL (ref 145–400)
RBC: 4.07 10*6/uL (ref 3.70–5.45)
RDW: 15.7 % — ABNORMAL HIGH (ref 11.2–14.5)
WBC: 8.5 10*3/uL (ref 3.9–10.3)

## 2015-02-09 LAB — COMPREHENSIVE METABOLIC PANEL (CC13)
ALBUMIN: 2.7 g/dL — AB (ref 3.5–5.0)
ALT: 33 U/L (ref 0–55)
ANION GAP: 13 meq/L — AB (ref 3–11)
AST: 65 U/L — ABNORMAL HIGH (ref 5–34)
Alkaline Phosphatase: 188 U/L — ABNORMAL HIGH (ref 40–150)
BUN: 27.2 mg/dL — ABNORMAL HIGH (ref 7.0–26.0)
CHLORIDE: 92 meq/L — AB (ref 98–109)
CO2: 28 meq/L (ref 22–29)
CREATININE: 1.5 mg/dL — AB (ref 0.6–1.1)
Calcium: 9.5 mg/dL (ref 8.4–10.4)
EGFR: 39 mL/min/{1.73_m2} — ABNORMAL LOW (ref >90)
GLUCOSE: 268 mg/dL — AB (ref 70–140)
POTASSIUM: 3.4 meq/L — AB (ref 3.5–5.1)
Sodium: 133 mEq/L — ABNORMAL LOW (ref 136–145)
Total Bilirubin: 0.9 mg/dL (ref 0.20–1.20)
Total Protein: 7.8 g/dL (ref 6.4–8.3)

## 2015-02-09 LAB — CEA: CEA: 93.4 ng/mL — AB (ref 0.0–5.0)

## 2015-02-09 MED ORDER — HEPARIN SOD (PORK) LOCK FLUSH 100 UNIT/ML IV SOLN
500.0000 [IU] | Freq: Once | INTRAVENOUS | Status: AC | PRN
  Administered 2015-02-09: 500 [IU]
  Filled 2015-02-09: qty 5

## 2015-02-09 MED ORDER — SODIUM CHLORIDE 0.9 % IJ SOLN
10.0000 mL | INTRAMUSCULAR | Status: DC | PRN
  Administered 2015-02-09: 10 mL via INTRAVENOUS
  Filled 2015-02-09: qty 10

## 2015-02-09 MED ORDER — IRINOTECAN HCL CHEMO INJECTION 100 MG/5ML
182.0000 mg/m2 | Freq: Once | INTRAVENOUS | Status: AC
  Administered 2015-02-09: 320 mg via INTRAVENOUS
  Filled 2015-02-09: qty 16

## 2015-02-09 MED ORDER — ATROPINE SULFATE 1 MG/ML IJ SOLN: 0.5000 mg | Freq: Once | INTRAMUSCULAR | Status: DC | PRN

## 2015-02-09 MED ORDER — SODIUM CHLORIDE 0.9 % IV SOLN
Freq: Once | INTRAVENOUS | Status: AC
  Administered 2015-02-09: 10:00:00 via INTRAVENOUS
  Filled 2015-02-09: qty 4

## 2015-02-09 MED ORDER — SODIUM CHLORIDE 0.9 % IJ SOLN
10.0000 mL | INTRAMUSCULAR | Status: DC | PRN
  Administered 2015-02-09: 10 mL
  Filled 2015-02-09: qty 10

## 2015-02-09 MED ORDER — SODIUM CHLORIDE 0.9 % IV SOLN
Freq: Once | INTRAVENOUS | Status: AC
  Administered 2015-02-09: 10:00:00 via INTRAVENOUS

## 2015-02-09 NOTE — Progress Notes (Signed)
Patient was identified to be at risk for malnutrition on the MST secondary to poor appetite and weight loss.  54 year old female diagnosed with colon cancer in 2014 now with disease progression.  She is a patient of Dr. Alvy Bimler.  Past medical history includes diabetes, bipolar disorder, depression, hepatitis C, anxiety, IBS, postop nausea vomiting, and constipation.  Medications include Lipitor, Klonopin, Lasix, Humalog, Lantus, Prilosec, MiraLAX.,Geodon, Senokot.  Labs include glucose 85, albumin 3.4 on June 24.  Height: 65 inches. Weight: 149.1 pounds June 27. Usual body weight: 165 pounds in April 2016. BMI: 24.81.  Patient asks why she continues to lose weight. She reports poor appetite, taste alterations, and chronic constipation. Patient states she drinks a high protein nutrition drink that is low carbohydrate.  She does not know the name of the product.  Nutrition diagnosis: Unintended weight loss related to disease progression of colon cancer, poor appetite and inadequate oral intake as evidenced by 10% weight loss in 3 months.  Intervention: Educated patient on the importance of increasing calories and protein in 3 meals +3 snacks daily. Recommended patient change oral nutrition supplements to Ensure Plus or boost plus twice a day Provided a sample for patient to try along with coupons for purchase. Educated patient on the importance of gradually adding fiber to her diet and increasing fluids to help with chronic constipation. Recommended patient drink prune juice daily because patient enjoys this. Questions were answered and teach back method used.  My contact information was provided.  Monitoring, evaluation, goals: Patient will tolerate adequate calories and protein to minimize weight loss and improve nutrition impact symptoms.  Next visit: Patient has my contact information for questions or concerns.  **Disclaimer: This note was dictated with voice recognition software.  Similar sounding words can inadvertently be transcribed and this note may contain transcription errors which may not have been corrected upon publication of note.**

## 2015-02-09 NOTE — Progress Notes (Signed)
SPOKE W/  PT'S FRIEND, DEBORAH GRAHAM (WHOM IS ON PT HIPPA).  NPO AFTER MN.  ARRIVE AT 0600.  NEEDS ISTAT  AND EKG.  WILL TAKE AM MEDS W/ SIPS OF WATER WITH EXCEPTION NO INSULIN OR LASIX. PT HAS SHORT TERM MEMORY LOSS FROM CHEMO, WILL NEED FATHER AND FRIEND BE WITH HER IN PRE-OP.

## 2015-02-09 NOTE — Patient Instructions (Addendum)
[facility] Discharge Instructions for Patients Receiving Chemotherapy  Today you received the following chemotherapy agents: Irinotecan.  To help prevent nausea and vomiting after your treatment, we encourage you to take your nausea medication:  At the first sign of diarrhea: Take 2 Imodium (Over the counter). Take 1 tablet every 2 hours during the day until you have not had a bowel movement in 12 hours.   Call the clinic if diarrhea persists despite taking Imodium.   If you develop nausea and vomiting that is not controlled by your nausea medication, call the clinic.   BELOW ARE SYMPTOMS THAT SHOULD BE REPORTED IMMEDIATELY:  *FEVER GREATER THAN 100.5 F  *CHILLS WITH OR WITHOUT FEVER  NAUSEA AND VOMITING THAT IS NOT CONTROLLED WITH YOUR NAUSEA MEDICATION  *UNUSUAL SHORTNESS OF BREATH  *UNUSUAL BRUISING OR BLEEDING  TENDERNESS IN MOUTH AND THROAT WITH OR WITHOUT PRESENCE OF ULCERS  *URINARY PROBLEMS  *BOWEL PROBLEMS  UNUSUAL RASH Items with * indicate a potential emergency and should be followed up as soon as possible.  Feel free to call the clinic should you have any questions or concerns. The clinic phone number is [phone].  Please show the [facility] at check-in to the Emergency Department and triage nurse.    Irinotecan injection What is this medicine? IRINOTECAN (ir in oh TEE kan ) is a chemotherapy drug. It is used to treat colon and rectal cancer. This medicine may be used for other purposes; ask your health care provider or pharmacist if you have questions. COMMON BRAND NAME(S): Camptosar What should I tell my health care provider before I take this medicine? They need to know if you have any of these conditions: -blood disorders -dehydration -diarrhea -infection (especially a virus infection such as chickenpox, cold sores, or herpes) -liver disease -low blood counts, like low white cell, platelet, or red cell counts -recent or  ongoing radiation therapy -an unusual or allergic reaction to irinotecan, sorbitol, other chemotherapy, other medicines, foods, dyes, or preservatives -pregnant or trying to get pregnant -breast-feeding How should I use this medicine? This drug is given as an infusion into a vein. It is administered in a hospital or clinic by a specially trained health care professional. Talk to your pediatrician regarding the use of this medicine in children. Special care may be needed. Overdosage: If you think you have taken too much of this medicine contact a poison control center or emergency room at once. NOTE: This medicine is only for you. Do not share this medicine with others. What if I miss a dose? It is important not to miss your dose. Call your doctor or health care professional if you are unable to keep an appointment. What may interact with this medicine? Do not take this medicine with any of the following medications: -atazanavir -certain medicines for fungal infections like itraconazole and ketoconazole -St. John's Wort This medicine may also interact with the following medications: -dexamethasone -diuretics -laxatives -medicines for seizures like carbamazepine, mephobarbital, phenobarbital, phenytoin, primidone -medicines to increase blood counts like filgrastim, pegfilgrastim, sargramostim -prochlorperazine -vaccines This list may not describe all possible interactions. Give your health care provider a list of all the medicines, herbs, non-prescription drugs, or dietary supplements you use. Also tell them if you smoke, drink alcohol, or use illegal drugs. Some items may interact with your medicine. What should I watch for while using this medicine? Your condition will be monitored carefully while you are receiving this medicine. You will need important blood  work done while you are taking this medicine. This drug may make you feel generally unwell. This is not uncommon, as chemotherapy  can affect healthy cells as well as cancer cells. Report any side effects. Continue your course of treatment even though you feel ill unless your doctor tells you to stop. In some cases, you may be given additional medicines to help with side effects. Follow all directions for their use. You may get drowsy or dizzy. Do not drive, use machinery, or do anything that needs mental alertness until you know how this medicine affects you. Do not stand or sit up quickly, especially if you are an older patient. This reduces the risk of dizzy or fainting spells. Call your doctor or health care professional for advice if you get a fever, chills or sore throat, or other symptoms of a cold or flu. Do not treat yourself. This drug decreases your body's ability to fight infections. Try to avoid being around people who are sick. This medicine may increase your risk to bruise or bleed. Call your doctor or health care professional if you notice any unusual bleeding. Be careful brushing and flossing your teeth or using a toothpick because you may get an infection or bleed more easily. If you have any dental work done, tell your dentist you are receiving this medicine. Avoid taking products that contain aspirin, acetaminophen, ibuprofen, naproxen, or ketoprofen unless instructed by your doctor. These medicines may hide a fever. Do not become pregnant while taking this medicine. Women should inform their doctor if they wish to become pregnant or think they might be pregnant. There is a potential for serious side effects to an unborn child. Talk to your health care professional or pharmacist for more information. Do not breast-feed an infant while taking this medicine. What side effects may I notice from receiving this medicine? Side effects that you should report to your doctor or health care professional as soon as possible: -allergic reactions like skin rash, itching or hives, swelling of the face, lips, or tongue -low  blood counts - this medicine may decrease the number of white blood cells, red blood cells and platelets. You may be at increased risk for infections and bleeding. -signs of infection - fever or chills, cough, sore throat, pain or difficulty passing urine -signs of decreased platelets or bleeding - bruising, pinpoint red spots on the skin, black, tarry stools, blood in the urine -signs of decreased red blood cells - unusually weak or tired, fainting spells, lightheadedness -breathing problems -chest pain -diarrhea -feeling faint or lightheaded, falls -flushing, runny nose, sweating during infusion -mouth sores or pain -pain, swelling, redness or irritation where injected -pain, swelling, warmth in the leg -pain, tingling, numbness in the hands or feet -problems with balance, talking, walking -stomach cramps, pain -trouble passing urine or change in the amount of urine -vomiting as to be unable to hold down drinks or food -yellowing of the eyes or skin Side effects that usually do not require medical attention (report to your doctor or health care professional if they continue or are bothersome): -constipation -hair loss -headache -loss of appetite -nausea, vomiting -stomach upset This list may not describe all possible side effects. Call your doctor for medical advice about side effects. You may report side effects to FDA at 1-800-FDA-1088. Where should I keep my medicine? This drug is given in a hospital or clinic and will not be stored at home. NOTE: This sheet is a summary. It may not cover all  possible information. If you have questions about this medicine, talk to your doctor, pharmacist, or health care provider.  2015, [employer]vier/[employer] Standard. (2013-01-17 16:29:32)

## 2015-02-09 NOTE — Progress Notes (Signed)
Pt tolerated first Irinotecan infusion without difficulty. Denied nausea/diarrhea/ cramping during infusion. Pt and father given verbal and written instructions re: symptom management. Imodium instruction card given. Pt voiced understanding, stated she will pick up Imodium today.

## 2015-02-09 NOTE — Patient Instructions (Signed)

## 2015-02-09 NOTE — Progress Notes (Signed)
Per Dr Alvy Bimler pt only getting irinotecan today.  No Zaltrap at this time.   Pt c/o persistent vaginal bleeding x2 weeks.  Dr Alvy Bimler notified.  Ok to proceed with treatment today, pt instructed to follow up with PCP.  Pt verbalized understanding.

## 2015-02-11 ENCOUNTER — Encounter (HOSPITAL_BASED_OUTPATIENT_CLINIC_OR_DEPARTMENT_OTHER): Payer: Self-pay | Admitting: Anesthesiology

## 2015-02-11 NOTE — Anesthesia Preprocedure Evaluation (Addendum)
Anesthesia Evaluation  Patient identified by MRN, date of birth, ID band Patient awake    Reviewed: Allergy & Precautions, H&P , NPO status , Patient's Chart, lab work & pertinent test results  History of Anesthesia Complications (+) PONV and history of anesthetic complications  Airway Mallampati: III   Neck ROM: full    Dental no notable dental hx. (+) Missing   Pulmonary Current Smoker,    Pulmonary exam normal       Cardiovascular hypertension, Normal cardiovascular exam    Neuro/Psych PSYCHIATRIC DISORDERS Anxiety Bipolar Disorder    GI/Hepatic (+) Hepatitis -, C  Endo/Other  diabetes, Type 2  Renal/GU Renal disease     Musculoskeletal   Abdominal   Peds  Hematology  (+) anemia ,   Anesthesia Other Findings Depressed mental status, allmost sleeping, does awake and is oriented with appropriate answers  Reproductive/Obstetrics                          Anesthesia Physical  Anesthesia Plan  ASA: IV  Anesthesia Plan: General   Post-op Pain Management:    Induction: Intravenous  Airway Management Planned: LMA  Additional Equipment:   Intra-op Plan:   Post-operative Plan: Extubation in OR  Informed Consent: I have reviewed the patients History and Physical, chart, labs and discussed the procedure including the risks, benefits and alternatives for the proposed anesthesia with the patient or authorized representative who has indicated his/her understanding and acceptance.     Plan Discussed with: CRNA, Anesthesiologist and Surgeon  Anesthesia Plan Comments: (Meds: Lipitor, Klonopin, Lasix, Humalog, Lantus, Prilosec, MiraLAX.,Geodon, Senokot. NPO appropriate,  allergies reviewed Denies active cardiac or pulmonary symptoms, METS > 4 No recent congestive cough or symptoms of upper respiratory infection - took 10mg  valium this am, combined with starting new chemo, multiple medical  problems her mental status is depressed but she answers questions appropriately, has her power of attorney with her, her dad - glucose 252, will give 3units SQ novolog )      Anesthesia Quick Evaluation

## 2015-02-11 NOTE — H&P (Signed)
History of Present Illness   Karyme presents today as a referral from Dr Alvy Bimler for assessment of hydronephrosis and consideration of ureteral stenting. Monica [name] is currently 54 years of age. She has a number of significant medical issues and a fairly complicated history over the last several years. She was diagnosed with colorectal cancer status post a colectomy. Unfortunately, her tumor has not responded well to standard chemotherapy and she has had evidence of significant progression of her disease. I have reviewed her records from [facility] as well as her CT report and CT images. She does have some chronic issues with low back pain but has not had flank pain, per se. Her overall systemic renal function has been fairly stable and her creatinine was 1.1 when checked approximately a week ago. She has evidence, however, of some progressive primarily left-sided hydronephrosis. At this point it is not severe but moderate in nature. She did have reasonable excretion of contrast with minimal delay. She is going to be starting a new regimen of chemotherapy in the next week or two. She is sent for consideration of stent placement to protect left-sided renal function. She does complain of some lower extremity/foot edema. She has been on diuretics. She does have some urinary urgency and frequency. No gross hematuria. She has been under the care of Dr Mercy Moore in the past from nephrology services.       Past Medical History Problems  1. History of Bipolar disorder (F31.9) 2. History of chronic kidney disease (Z87.448) 3. History of diabetic neuropathy (Z86.39) 4. History of hepatitis C virus infection (Z86.19) 5. History of hyperlipidemia (Z86.39) 6. History of hypertension (Z86.79) 7. History of malignant neoplasm of colon (Z85.038) 8. 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 History of Cesarean Section 2. History of Colon Surgery 3. History of Knee  Surgery  Current Meds 1. Geodon 80 MG Oral Capsule;  Therapy: (Recorded:01Jul2016) to Recorded 2. Insulin;  Therapy: (Recorded:01Jul2016) to Recorded 3. KlonoPIN TABS;  Therapy: (Recorded:01Jul2016) to Recorded 4. LaMICtal 100 MG Oral Tablet;  Therapy: (Recorded:01Jul2016) to Recorded 5. Lasix 40 MG Oral Tablet;  Therapy: (Recorded:01Jul2016) to Recorded 6. Neurontin 800 MG Oral Tablet;  Therapy: (Recorded:01Jul2016) to Recorded 7. Valium TABS;  Therapy: (Recorded:01Jul2016) to Recorded  Allergies Medication  1. Naproxen Sodium TABS  Family History Problems  1. Family history of kidney stones (Z84.1) : Father 2. No significant family history : Mother  Review of Systems Genitourinary, constitutional, skin, eye, otolaryngeal, hematologic/lymphatic, cardiovascular, pulmonary, endocrine, musculoskeletal, gastrointestinal, neurological and psychiatric system(s) were reviewed and pertinent findings if present are noted and are otherwise negative.  Genitourinary: urinary frequency and urinary urgency.  Gastrointestinal: constipation.  Constitutional: feeling tired (fatigue).  ENT: sinus problems.  Musculoskeletal: back pain.  Psychiatric: depression and anxiety.    Vitals Vital Signs [Data Includes: Last 1 Day]  Recorded: [id number] 10:00AM  Height: 5 ft 5 in Weight: 149 lb  BMI Calculated: 24.79 BSA Calculated: 1.75 Blood Pressure: 132 / 87 Temperature: 98.3 F Heart Rate: 73  Physical Exam Constitutional: Well nourished and well developed . No acute distress.  ENT:. The ears and nose are normal in appearance.  Neck: The appearance of the neck is normal and no neck mass is present.  Pulmonary: No respiratory distress and normal respiratory rhythm and effort.  Cardiovascular: Heart rate and rhythm are normal . No peripheral edema.  Abdomen: The abdomen is soft and nontender. No masses are palpated. No CVA tenderness. No hernias are palpable.  No hepatosplenomegaly noted.   Skin: Normal skin turgor, no visible rash and no visible skin lesions.  Neuro/Psych:. Mood and affect are appropriate.    Results/Data  02 Feb 2015 9:24 AM  UA With REFLEX    COLOR YELLOW     APPEARANCE CLOUDY     SPECIFIC GRAVITY 1.020     pH 5.5     GLUCOSE NEG     BILIRUBIN NEG     KETONE NEG     BLOOD TRACE     PROTEIN NEG     UROBILINOGEN 0.2     NITRITE NEG     LEUKOCYTE ESTERASE MOD     SQUAMOUS EPITHELIAL/HPF FEW     WBC 21-50     CRYSTALS NONE SEEN     CASTS NONE SEEN     RBC NONE SEEN     BACTERIA FEW     Assessment Assessed  1. Hydronephrosis (N13.30)  Plan Hydronephrosis  1. Follow-up Schedule Surgery Office  Follow-up  Status: Complete  Done: [id number]  Discussion/Summary   Rosaelena has had a tough and ongoing battle with metastatic colorectal cancer. She does have evidence of moderate left hydronephrosis. There may be some very mild dilation of the right kidney but nothing significant. Overall systemic renal function is normal and she does not appear to be having any symptoms contributing to hydronephrosis. Hydronephrosis secondary to malignancy is usually asymptomatic. She is in a position now where a stent could potentially benefit her for preservation of renal function for ongoing systemic therapy. She is told, however, that it is not essential she have a stent. The other option would be to monitor her both clinically as well as with renal function and ongoing imaging studies. Certainly if she had a good response to the next round of chemotherapy it is certainly possible that the hydronephrosis could resolve on its own. Again, the safest way to proceed would be to go ahead with left stenting. She was told that if she tolerates the stent poorly and it really affects her quality of life it could always be removed with then careful monitoring. After discussion with her friends and family we have elected to proceed with stent placement sometime in the next week or two  with then followup to determine how well she is doing clinically.   cc: Heath Lark, MD Georges Lynch, MD      Signatures Electronically signed by : Rana Snare, M.D.; Feb 07 2015  9:18AM EST

## 2015-02-12 ENCOUNTER — Ambulatory Visit (HOSPITAL_BASED_OUTPATIENT_CLINIC_OR_DEPARTMENT_OTHER): Payer: Medicaid Other | Admitting: Anesthesiology

## 2015-02-12 ENCOUNTER — Encounter (HOSPITAL_BASED_OUTPATIENT_CLINIC_OR_DEPARTMENT_OTHER): Payer: Self-pay

## 2015-02-12 ENCOUNTER — Encounter (HOSPITAL_BASED_OUTPATIENT_CLINIC_OR_DEPARTMENT_OTHER)
Admission: RE | Disposition: A | Discharge status: Home / Self Care | Payer: Self-pay | Source: Ambulatory Visit | Attending: Urology

## 2015-02-12 ENCOUNTER — Ambulatory Visit (HOSPITAL_BASED_OUTPATIENT_CLINIC_OR_DEPARTMENT_OTHER)
Admission: RE | Admit: 2015-02-12 | Discharge: 2015-02-12 | Disposition: A | Discharge status: Home / Self Care | Payer: Medicaid Other | Source: Ambulatory Visit | Attending: Urology | Admitting: Urology

## 2015-02-12 ENCOUNTER — Other Ambulatory Visit: Payer: Self-pay

## 2015-02-12 DIAGNOSIS — E119 Type 2 diabetes mellitus without complications: Secondary | ICD-10-CM | POA: Insufficient documentation

## 2015-02-12 DIAGNOSIS — C19 Malignant neoplasm of rectosigmoid junction: Secondary | ICD-10-CM | POA: Insufficient documentation

## 2015-02-12 DIAGNOSIS — D649 Anemia, unspecified: Secondary | ICD-10-CM | POA: Diagnosis not present

## 2015-02-12 DIAGNOSIS — F319 Bipolar disorder, unspecified: Secondary | ICD-10-CM | POA: Insufficient documentation

## 2015-02-12 DIAGNOSIS — Z794 Long term (current) use of insulin: Secondary | ICD-10-CM | POA: Insufficient documentation

## 2015-02-12 DIAGNOSIS — Z885 Allergy status to narcotic agent status: Secondary | ICD-10-CM | POA: Diagnosis not present

## 2015-02-12 DIAGNOSIS — M545 Low back pain: Secondary | ICD-10-CM | POA: Insufficient documentation

## 2015-02-12 DIAGNOSIS — R350 Frequency of micturition: Secondary | ICD-10-CM | POA: Diagnosis not present

## 2015-02-12 DIAGNOSIS — F17210 Nicotine dependence, cigarettes, uncomplicated: Secondary | ICD-10-CM | POA: Insufficient documentation

## 2015-02-12 DIAGNOSIS — Z9049 Acquired absence of other specified parts of digestive tract: Secondary | ICD-10-CM | POA: Insufficient documentation

## 2015-02-12 DIAGNOSIS — Z8619 Personal history of other infectious and parasitic diseases: Secondary | ICD-10-CM | POA: Insufficient documentation

## 2015-02-12 DIAGNOSIS — I129 Hypertensive chronic kidney disease with stage 1 through stage 4 chronic kidney disease, or unspecified chronic kidney disease: Secondary | ICD-10-CM | POA: Insufficient documentation

## 2015-02-12 DIAGNOSIS — E785 Hyperlipidemia, unspecified: Secondary | ICD-10-CM | POA: Insufficient documentation

## 2015-02-12 DIAGNOSIS — N1330 Unspecified hydronephrosis: Secondary | ICD-10-CM | POA: Diagnosis not present

## 2015-02-12 DIAGNOSIS — N189 Chronic kidney disease, unspecified: Secondary | ICD-10-CM | POA: Insufficient documentation

## 2015-02-12 HISTORY — DX: Chronic pain syndrome: G89.4

## 2015-02-12 HISTORY — DX: Long term (current) use of insulin: Z79.4

## 2015-02-12 HISTORY — DX: Personal history of self-harm: Z91.5

## 2015-02-12 HISTORY — DX: Unspecified hydronephrosis: N13.30

## 2015-02-12 HISTORY — DX: Essential (primary) hypertension: I10

## 2015-02-12 HISTORY — DX: Type 2 diabetes mellitus with diabetic neuropathy, unspecified: E11.40

## 2015-02-12 HISTORY — DX: Secondary malignant neoplasm of liver and intrahepatic bile duct: C78.7

## 2015-02-12 HISTORY — PX: CYSTOSCOPY W/ URETERAL STENT PLACEMENT: SHX1429

## 2015-02-12 HISTORY — DX: Other specified personal risk factors, not elsewhere classified: Z91.89

## 2015-02-12 HISTORY — DX: Type 2 diabetes mellitus without complications: E11.9

## 2015-02-12 HISTORY — DX: Other amnesia: R41.3

## 2015-02-12 HISTORY — DX: Personal history of suicidal behavior: Z91.51

## 2015-02-12 LAB — POCT I-STAT 4, (NA,K, GLUC, HGB,HCT)
GLUCOSE: 252 mg/dL — AB (ref 65–99)
HCT: 37 % (ref 36.0–46.0)
HEMOGLOBIN: 12.6 g/dL (ref 12.0–15.0)
Potassium: 3.5 mmol/L (ref 3.5–5.1)
Sodium: 135 mmol/L (ref 135–145)

## 2015-02-12 LAB — GLUCOSE, CAPILLARY: Glucose-Capillary: 184 mg/dL — ABNORMAL HIGH (ref 65–99)

## 2015-02-12 SURGERY — CYSTOSCOPY, WITH RETROGRADE PYELOGRAM AND URETERAL STENT INSERTION
Anesthesia: General | Site: Bladder | Laterality: Left

## 2015-02-12 MED ORDER — PROPOFOL 10 MG/ML IV BOLUS
INTRAVENOUS | Status: DC | PRN
  Administered 2015-02-12: 100 mg via INTRAVENOUS

## 2015-02-12 MED ORDER — ONDANSETRON HCL 4 MG/2ML IJ SOLN
INTRAMUSCULAR | Status: DC | PRN
  Administered 2015-02-12: 8 mg via INTRAVENOUS

## 2015-02-12 MED ORDER — FENTANYL CITRATE (PF) 100 MCG/2ML IJ SOLN
INTRAMUSCULAR | Status: DC | PRN
  Administered 2015-02-12: 25 ug via INTRAVENOUS

## 2015-02-12 MED ORDER — FENTANYL CITRATE (PF) 100 MCG/2ML IJ SOLN
INTRAMUSCULAR | Status: AC
  Filled 2015-02-12: qty 4

## 2015-02-12 MED ORDER — CEFAZOLIN SODIUM 1-5 GM-% IV SOLN
1.0000 g | INTRAVENOUS | Status: DC
  Filled 2015-02-12: qty 50

## 2015-02-12 MED ORDER — INSULIN ASPART 100 UNIT/ML ~~LOC~~ SOLN
3.0000 [IU] | Freq: Once | SUBCUTANEOUS | Status: AC
  Administered 2015-02-12: 3 [IU] via SUBCUTANEOUS
  Filled 2015-02-12: qty 0.03

## 2015-02-12 MED ORDER — SODIUM CHLORIDE 0.9 % IR SOLN
Status: DC | PRN
  Administered 2015-02-12: 1000 mL via INTRAVESICAL
  Administered 2015-02-12: 3000 mL via INTRAVESICAL

## 2015-02-12 MED ORDER — ACETAMINOPHEN 10 MG/ML IV SOLN: INTRAVENOUS | Status: DC | PRN

## 2015-02-12 MED ORDER — LIDOCAINE HCL 2 % EX GEL
CUTANEOUS | Status: DC | PRN
  Administered 2015-02-12: 1 via URETHRAL

## 2015-02-12 MED ORDER — CEFAZOLIN SODIUM-DEXTROSE 2-3 GM-% IV SOLR
INTRAVENOUS | Status: AC
  Filled 2015-02-12: qty 50

## 2015-02-12 MED ORDER — LIDOCAINE HCL (CARDIAC) 20 MG/ML IV SOLN
INTRAVENOUS | Status: DC | PRN
  Administered 2015-02-12: 60 mg via INTRAVENOUS

## 2015-02-12 MED ORDER — LACTATED RINGERS IV SOLN
INTRAVENOUS | Status: DC
  Administered 2015-02-12 (×4): via INTRAVENOUS
  Filled 2015-02-12: qty 1000

## 2015-02-12 MED ORDER — CEFAZOLIN SODIUM-DEXTROSE 2-3 GM-% IV SOLR
2.0000 g | INTRAVENOUS | Status: AC
  Administered 2015-02-12: 2 g via INTRAVENOUS
  Filled 2015-02-12: qty 50

## 2015-02-12 MED ORDER — MIDAZOLAM HCL 2 MG/2ML IJ SOLN
INTRAMUSCULAR | Status: AC
  Filled 2015-02-12: qty 2

## 2015-02-12 MED ORDER — FENTANYL CITRATE (PF) 100 MCG/2ML IJ SOLN
25.0000 ug | INTRAMUSCULAR | Status: DC | PRN
  Filled 2015-02-12: qty 1

## 2015-02-12 MED ORDER — METHYLENE BLUE 1 % INJ SOLN
INTRAMUSCULAR | Status: DC | PRN
  Administered 2015-02-12: 10 mg via INTRAVENOUS

## 2015-02-12 MED ORDER — IOHEXOL 350 MG/ML SOLN
INTRAVENOUS | Status: DC | PRN
  Administered 2015-02-12: 17 mL

## 2015-02-12 SURGICAL SUPPLY — 21 items
APL SKNCLS STERI-STRIP NONHPOA (GAUZE/BANDAGES/DRESSINGS)
BAG DRAIN URO-CYSTO SKYTR STRL (DRAIN) ×3 IMPLANT
BAG DRN UROCATH (DRAIN) ×1
BENZOIN TINCTURE PRP APPL 2/3 (GAUZE/BANDAGES/DRESSINGS) IMPLANT
CATH INTERMIT  6FR 70CM (CATHETERS) ×2 IMPLANT
CLOTH BEACON ORANGE TIMEOUT ST (SAFETY) ×3 IMPLANT
DRSG TEGADERM 2-3/8X2-3/4 SM (GAUZE/BANDAGES/DRESSINGS) IMPLANT
GLOVE BIO SURGEON STRL SZ 6.5 (GLOVE) ×1 IMPLANT
GLOVE BIO SURGEON STRL SZ7.5 (GLOVE) ×3 IMPLANT
GLOVE BIO SURGEONS STRL SZ 6.5 (GLOVE) ×1
GLOVE INDICATOR 6.5 STRL GRN (GLOVE) ×2 IMPLANT
GOWN STRL REUS W/ TWL LRG LVL3 (GOWN DISPOSABLE) IMPLANT
GOWN STRL REUS W/ TWL XL LVL3 (GOWN DISPOSABLE) ×1 IMPLANT
GOWN STRL REUS W/TWL LRG LVL3 (GOWN DISPOSABLE) ×3
GOWN STRL REUS W/TWL XL LVL3 (GOWN DISPOSABLE) ×3
GUIDEWIRE ANG ZIPWIRE 038X150 (WIRE) ×2 IMPLANT
GUIDEWIRE STR DUAL SENSOR (WIRE) ×2 IMPLANT
MANIFOLD NEPTUNE II (INSTRUMENTS) ×2 IMPLANT
NS IRRIG 500ML POUR BTL (IV SOLUTION) ×2 IMPLANT
PACK CYSTO (CUSTOM PROCEDURE TRAY) ×3 IMPLANT
STENT CONTOUR 7FRX24 (STENTS) ×2 IMPLANT

## 2015-02-12 NOTE — Discharge Instructions (Addendum)
Cystoscopy patient instructions  Following a cystoscopy, a catheter (a flexible rubber tube) is sometimes left in place to empty the bladder. This may cause some discomfort or a feeling that you need to urinate. Your doctor determines the period of time that the catheter will be left in place. You may have bloody urine for two to three days (Call your doctor if the amount of bleeding increases or does not subside).  You may pass blood clots in your urine, especially if you had a biopsy. It is not unusual to pass small blood clots and have some bloody urine a couple of weeks after your cystoscopy. Again, call your doctor if the bleeding does not subside. You may have: Dysuria (painful urination) Frequency (urinating often) Urgency (strong desire to urinate)  These symptoms are common especially if medicine is instilled into the bladder or a ureteral stent is placed. Avoiding alcohol and caffeine, such as coffee, tea, and chocolate, may help relieve these symptoms. Drink plenty of water, unless otherwise instructed. Your doctor may also prescribe an antibiotic or other medicine to reduce these symptoms.  Cystoscopy results are available soon after the procedure; biopsy results usually take two to four days. Your doctor will discuss the results of your exam with you. Before you go home, you will be given specific instructions for follow-up care. Special Instructions:  1. If you are going home with a catheter in place do not take a tub bath until removed by your doctor.  2. You may resume your normal activities.  3. Do not drive or operate machinery if you are taking narcotic pain medicine.  4. Be sure to keep all follow-up appointments with your doctor.   5. Call Your Doctor If: The catheter is not draining 1. You have severe pain 2. You are unable to urinate 3. You have a fever over 101 4. You have severe bleeding         [facility] [phone] Post Ureteroscopy With  or Without Stent Instructions  Definitions:  Ureter: The duct that transports urine from the kidney to the bladder. Stent:   A plastic hollow tube that is placed into the ureter, from the kidney to the                 bladder to prevent the ureter from swelling shut.  GENERAL INSTRUCTIONS:  Despite the fact that no skin incisions were used, the area around the ureter and bladder is raw and irritated. The stent is a foreign body which will further irritate the bladder wall. This irritation is manifested by increased frequency of urination, both day and night, and by an increase in the urge to urinate. In some, the urge to urinate is present almost always. Sometimes the urge is strong enough that you may not be able to stop yourself from urinating. The only real cure is to remove the stent and then give time for the bladder wall to heal which can't be done until the danger of the ureter swelling shut has passed, which varies.  You may see some blood in your urine while the stent is in place and a few days afterwards. Do not be alarmed, even if the urine was clear for a while. Get off your feet and drink lots of fluids until clearing occurs. If you start to pass clots or don't improve, call us.  DIET: You may return to your normal diet immediately. Because of the raw surface of your bladder, alcohol, spicy foods, acid  type foods and drinks with caffeine may cause irritation or frequency and should be used in moderation. To keep your urine flowing freely and to avoid constipation, drink plenty of fluids during the day ( 8-10 glasses ). Tip: Avoid cranberry juice because it is very acidic.  ACTIVITY: Your physical activity doesn't need to be restricted. However, if you are very active, you may see some blood in your urine. We suggest that you reduce your activity under these circumstances until the bleeding has stopped.  BOWELS: It is important to keep your bowels regular during the postoperative  period. Straining with bowel movements can cause bleeding. A bowel movement every other day is reasonable. Use a mild laxative if needed, such as Milk of Magnesia 2-3 tablespoons, or 2 Dulcolax tablets. Call if you continue to have problems. If you have been taking narcotics for pain, before, during or after your surgery, you may be constipated. Take a laxative if necessary.   MEDICATION: You should resume your pre-surgery medications unless told not to. In addition you will often be given an antibiotic to prevent infection. These should be taken as prescribed until the bottles are finished unless you are having an unusual reaction to one of the drugs.  PROBLEMS YOU SHOULD REPORT TO [place]:  Fevers over 100.5 Fahrenheit.  Heavy bleeding, or clots ( See above notes about blood in urine ).  Inability to urinate.  Drug reactions ( hives, rash, nausea, vomiting, diarrhea ).  Severe burning or pain with urination that is not improving.  FOLLOW-UP: You will need a follow-up appointment to monitor your progress. Call for this appointment at the number listed above. Usually the first appointment will be about three to fourteen days after your surgery.      Post Anesthesia Home Care Instructions  Activity: Get plenty of rest for the remainder of the day. A responsible adult should stay with you for 24 hours following the procedure.  For the next 24 hours, DO NOT: -Drive a car -Paediatric nurse -Drink alcoholic beverages -Take any medication unless instructed by your physician -Make any legal decisions or sign important papers.  Meals: Start with liquid foods such as gelatin or soup. Progress to regular foods as tolerated. Avoid greasy, spicy, heavy foods. If nausea and/or vomiting occur, drink only clear liquids until the nausea and/or vomiting subsides. Call your physician if vomiting continues.  Special Instructions/Symptoms: Your throat may feel dry or sore from the anesthesia or the  breathing tube placed in your throat during surgery. If this causes discomfort, gargle with warm salt water. The discomfort should disappear within 24 hours.  If you had a scopolamine patch placed behind your ear for the management of post- operative nausea and/or vomiting:  1. The medication in the patch is effective for 72 hours, after which it should be removed.  Wrap patch in a tissue and discard in the trash. Wash hands thoroughly with soap and water. 2. You may remove the patch earlier than 72 hours if you experience unpleasant side effects which may include dry mouth, dizziness or visual disturbances. 3. Avoid touching the patch. Wash your hands with soap and water after contact with the patch.

## 2015-02-12 NOTE — Anesthesia Procedure Notes (Signed)
Procedure Name: LMA Insertion Date/Time: 02/12/2015 7:40 AM Performed by: Justice Rocher Pre-anesthesia Checklist: Patient identified, Emergency Drugs available, Suction available and Patient being monitored Patient Re-evaluated:Patient Re-evaluated prior to inductionOxygen Delivery Method: Circle System Utilized Preoxygenation: Pre-oxygenation with 100% oxygen Intubation Type: IV induction Ventilation: Mask ventilation without difficulty LMA: LMA inserted LMA Size: 4.0 Number of attempts: 1 Airway Equipment and Method: Bite block Placement Confirmation: positive ETCO2 Tube secured with: Tape Dental Injury: Teeth and Oropharynx as per pre-operative assessment

## 2015-02-12 NOTE — Transfer of Care (Signed)
Immediate Anesthesia Transfer of Care Note  Patient: Monica [name]  Procedure(s) Performed: Procedure(s) (LRB): CYSTOSCOPY WITH LEFT RETROGRADE PYELOGRAM/URETERAL STENT PLACEMENT (Left)  Patient Location: PACU  Anesthesia Type: General  Level of Consciousness: awake, very sedated as preop affect , patient cooperative and responds to stimulation  Airway & Oxygen Therapy: Patient Spontanous Breathing and Patient connected to face mask oxygen  Post-op Assessment: Report given to PACU RN, Post -op Vital signs reviewed and stable and Patient moving all extremities  Post vital signs: Reviewed and stable  Complications: No apparent anesthesia complications

## 2015-02-12 NOTE — Op Note (Signed)
Preoperative diagnosis: Left hydronephrosis secondary to metastatic colorectal cancer Postoperative diagnosis: Same  Procedure: Stossel B, left retrograde polygrams, placement of left double-J stent 7 French 24 cm   Surgeon: Bernestine Amass M.D.  Anesthesia: Gen.  Indications: Monica [name] is had a battle with metastatic colorectal cancer. She's had disease progression through her primary chemotherapy. Recent imaging studies showed severe left Hydro. She is going to be embarking on an attempt at second line chemotherapy. Stent has been placed to maximize renal function.     Technique and findings: Patient was brought to the operating room where she had successful induction of general anesthesia. She was placed in lithotomy position and prepped and draped in usual manner. Appropriate surgical timeout was performed. Cystoscopy revealed generalized edema and erythema primarily on the bladder base and trigone. There appeared to be a mass effect up underneath her bladder distorting her trigone. Left ureteral orifice was eventually found. Retrograde pyelography showed multiple areas of extrinsic compression with significant obstruction/hydronephrosis. In the proximal ureter there was a 360 loop which eventually was straightened out with a wire. A 24 cm 7 French double-J stent was placed with fluoroscopic as well as visual guidance. No obvious Occasions occurred and the patient was brought to PACU in stable condition.

## 2015-02-12 NOTE — Interval H&P Note (Signed)
History and Physical Interval Note:  02/12/2015 7:31 AM  Monica [name]  has presented today for surgery, with the diagnosis of COLORECTAL CANCER , LEFT HYDRONEPHROSIS  The various methods of treatment have been discussed with the patient and family. After consideration of risks, benefits and other options for treatment, the patient has consented to  Procedure(s) with comments: CYSTOSCOPY WITH LEFT RETROGRADE PYELOGRAM/URETERAL STENT PLACEMENT (Left) - 30 MIN [place] [id number] as a surgical intervention .  The patient's history has been reviewed, patient examined, no change in status, stable for surgery.  I have reviewed the patient's chart and labs.  Questions were answered to the patient's satisfaction.     [name]

## 2015-02-12 NOTE — Anesthesia Postprocedure Evaluation (Signed)
  Anesthesia Post-op Note  Patient: Monica [name]  Procedure(s) Performed: Procedure(s) (LRB): CYSTOSCOPY WITH LEFT RETROGRADE PYELOGRAM/URETERAL STENT PLACEMENT (Left)  Patient Location: PACU  Anesthesia Type: General  Level of Consciousness: baseline, which is arousable, oriented, but appears sedated  Airway and Oxygen Therapy: Patient Spontanous Breathing  Post-op Pain: mild  Post-op Assessment: Post-op Vital signs reviewed, Patient's Cardiovascular Status Stable, Respiratory Function Stable, Patent Airway and No signs of Nausea or vomiting  Last Vitals:  Filed Vitals:   02/12/15 1054  BP: 117/72  Pulse: 68  Temp: 36.2 C  Resp: 18    Post-op Vital Signs: stable   Complications: No apparent anesthesia complications

## 2015-02-13 ENCOUNTER — Encounter (HOSPITAL_BASED_OUTPATIENT_CLINIC_OR_DEPARTMENT_OTHER): Payer: Self-pay | Admitting: Urology

## 2015-02-14 ENCOUNTER — Other Ambulatory Visit: Payer: Self-pay | Admitting: Hematology and Oncology

## 2015-02-23 ENCOUNTER — Ambulatory Visit: Payer: Medicaid Other

## 2015-02-23 ENCOUNTER — Other Ambulatory Visit: Payer: Self-pay | Admitting: Oncology

## 2015-02-23 ENCOUNTER — Other Ambulatory Visit (HOSPITAL_BASED_OUTPATIENT_CLINIC_OR_DEPARTMENT_OTHER): Payer: Medicaid Other

## 2015-02-23 ENCOUNTER — Ambulatory Visit (HOSPITAL_BASED_OUTPATIENT_CLINIC_OR_DEPARTMENT_OTHER): Payer: Medicaid Other

## 2015-02-23 ENCOUNTER — Other Ambulatory Visit: Payer: Self-pay | Admitting: *Deleted

## 2015-02-23 VITALS — BP 111/80 | HR 84 | Temp 97.8°F | Resp 18

## 2015-02-23 VITALS — Wt 137.9 lb

## 2015-02-23 DIAGNOSIS — C189 Malignant neoplasm of colon, unspecified: Secondary | ICD-10-CM

## 2015-02-23 DIAGNOSIS — C787 Secondary malignant neoplasm of liver and intrahepatic bile duct: Secondary | ICD-10-CM

## 2015-02-23 DIAGNOSIS — C786 Secondary malignant neoplasm of retroperitoneum and peritoneum: Secondary | ICD-10-CM

## 2015-02-23 DIAGNOSIS — Z95828 Presence of other vascular implants and grafts: Secondary | ICD-10-CM

## 2015-02-23 DIAGNOSIS — Z5111 Encounter for antineoplastic chemotherapy: Secondary | ICD-10-CM

## 2015-02-23 DIAGNOSIS — C182 Malignant neoplasm of ascending colon: Secondary | ICD-10-CM

## 2015-02-23 LAB — COMPREHENSIVE METABOLIC PANEL (CC13)
ALBUMIN: 2.6 g/dL — AB (ref 3.5–5.0)
ALK PHOS: 191 U/L — AB (ref 40–150)
ALT: 12 U/L (ref 0–55)
ANION GAP: 12 meq/L — AB (ref 3–11)
AST: 23 U/L (ref 5–34)
BILIRUBIN TOTAL: 0.33 mg/dL (ref 0.20–1.20)
BUN: 12 mg/dL (ref 7.0–26.0)
CALCIUM: 9.5 mg/dL (ref 8.4–10.4)
CHLORIDE: 98 meq/L (ref 98–109)
CO2: 28 meq/L (ref 22–29)
Creatinine: 1.2 mg/dL — ABNORMAL HIGH (ref 0.6–1.1)
EGFR: 54 mL/min/{1.73_m2} — ABNORMAL LOW (ref >90)
Glucose: 147 mg/dl — ABNORMAL HIGH (ref 70–140)
POTASSIUM: 4.5 meq/L (ref 3.5–5.1)
SODIUM: 138 meq/L (ref 136–145)
Total Protein: 7.7 g/dL (ref 6.4–8.3)

## 2015-02-23 LAB — CBC WITH DIFFERENTIAL/PLATELET
BASO%: 0.4 % (ref 0.0–2.0)
Basophils Absolute: 0 10*3/uL (ref 0.0–0.1)
EOS ABS: 0.1 10*3/uL (ref 0.0–0.5)
EOS%: 1.1 % (ref 0.0–7.0)
HEMATOCRIT: 35.3 % (ref 34.8–46.6)
HGB: 11.5 g/dL — ABNORMAL LOW (ref 11.6–15.9)
LYMPH%: 14.8 % (ref 14.0–49.7)
MCH: 28.5 pg (ref 25.1–34.0)
MCHC: 32.6 g/dL (ref 31.5–36.0)
MCV: 87.4 fL (ref 79.5–101.0)
MONO#: 0.7 10*3/uL (ref 0.1–0.9)
MONO%: 12.2 % (ref 0.0–14.0)
NEUT#: 4 10*3/uL (ref 1.5–6.5)
NEUT%: 71.5 % (ref 38.4–76.8)
Platelets: 251 10*3/uL (ref 145–400)
RBC: 4.04 10*6/uL (ref 3.70–5.45)
RDW: 15.5 % — ABNORMAL HIGH (ref 11.2–14.5)
WBC: 5.6 10*3/uL (ref 3.9–10.3)
lymph#: 0.8 10*3/uL — ABNORMAL LOW (ref 0.9–3.3)

## 2015-02-23 MED ORDER — ATROPINE SULFATE 1 MG/ML IJ SOLN
INTRAMUSCULAR | Status: AC
  Filled 2015-02-23: qty 1

## 2015-02-23 MED ORDER — SODIUM CHLORIDE 0.9 % IJ SOLN
10.0000 mL | INTRAMUSCULAR | Status: DC | PRN
  Administered 2015-02-23: 10 mL
  Filled 2015-02-23: qty 10

## 2015-02-23 MED ORDER — IRINOTECAN HCL CHEMO INJECTION 100 MG/5ML
182.0000 mg/m2 | Freq: Once | INTRAVENOUS | Status: AC
  Administered 2015-02-23: 320 mg via INTRAVENOUS
  Filled 2015-02-23: qty 16

## 2015-02-23 MED ORDER — ATROPINE SULFATE 1 MG/ML IJ SOLN
0.5000 mg | Freq: Once | INTRAMUSCULAR | Status: AC | PRN
  Administered 2015-02-23: 0.5 mg via INTRAVENOUS

## 2015-02-23 MED ORDER — SODIUM CHLORIDE 0.9 % IJ SOLN
10.0000 mL | INTRAMUSCULAR | Status: DC | PRN
  Administered 2015-02-23: 10 mL via INTRAVENOUS
  Filled 2015-02-23: qty 10

## 2015-02-23 MED ORDER — SODIUM CHLORIDE 0.9 % IV SOLN
Freq: Once | INTRAVENOUS | Status: AC
  Administered 2015-02-23: 11:00:00 via INTRAVENOUS
  Filled 2015-02-23: qty 4

## 2015-02-23 MED ORDER — HEPARIN SOD (PORK) LOCK FLUSH 100 UNIT/ML IV SOLN
500.0000 [IU] | Freq: Once | INTRAVENOUS | Status: AC | PRN
  Administered 2015-02-23: 500 [IU]
  Filled 2015-02-23: qty 5

## 2015-02-23 MED ORDER — SODIUM CHLORIDE 0.9 % IV SOLN
Freq: Once | INTRAVENOUS | Status: AC
  Administered 2015-02-23: 11:00:00 via INTRAVENOUS

## 2015-02-23 NOTE — Patient Instructions (Signed)

## 2015-02-23 NOTE — Patient Instructions (Signed)
[facility] Discharge Instructions for Patients Receiving Chemotherapy  Today you received the following chemotherapy agents Irinotecan  To help prevent nausea and vomiting after your treatment, we encourage you to take your nausea medication as directed.    If you develop nausea and vomiting that is not controlled by your nausea medication, call the clinic.   BELOW ARE SYMPTOMS THAT SHOULD BE REPORTED IMMEDIATELY:  *FEVER GREATER THAN 100.5 F  *CHILLS WITH OR WITHOUT FEVER  NAUSEA AND VOMITING THAT IS NOT CONTROLLED WITH YOUR NAUSEA MEDICATION  *UNUSUAL SHORTNESS OF BREATH  *UNUSUAL BRUISING OR BLEEDING  TENDERNESS IN MOUTH AND THROAT WITH OR WITHOUT PRESENCE OF ULCERS  *URINARY PROBLEMS  *BOWEL PROBLEMS  UNUSUAL RASH Items with * indicate a potential emergency and should be followed up as soon as possible.  Feel free to call the clinic you have any questions or concerns. The clinic phone number is [phone].  Please show the CHEMO ALERT CARD at check-in to the Emergency Department and triage nurse.   

## 2015-02-26 ENCOUNTER — Encounter: Payer: Self-pay | Admitting: Family Medicine

## 2015-02-26 ENCOUNTER — Ambulatory Visit (INDEPENDENT_AMBULATORY_CARE_PROVIDER_SITE_OTHER): Payer: Medicaid Other | Admitting: Family Medicine

## 2015-02-26 VITALS — BP 107/78 | HR 96 | Temp 97.6°F | Ht 65.0 in | Wt 136.8 lb

## 2015-02-26 DIAGNOSIS — G894 Chronic pain syndrome: Secondary | ICD-10-CM

## 2015-02-26 DIAGNOSIS — H532 Diplopia: Secondary | ICD-10-CM

## 2015-02-26 DIAGNOSIS — F1910 Other psychoactive substance abuse, uncomplicated: Secondary | ICD-10-CM | POA: Diagnosis not present

## 2015-02-26 NOTE — Patient Instructions (Signed)
It was a pleasure seeing you today in our clinic. Today we discussed your double vision and pain. Here is the treatment plan we have discussed and agreed upon together:   - I have sent a referral to pain management. It is critical that you and Dr. Franchot Mimes discuss discontinuing ALL Suboxone PRIOR to being seen at the pain clinic.  - As for your double vision: you currently are not having persistent symptoms. Because of this I think it would be wise to schedule an appointment with your oncologist; this double vision is likely from the new chemotherapy medication. - if the double vision persists: I think it would be wise to obtain an MRI of your brain. I would be happy to order this if this occurs.

## 2015-02-26 NOTE — Progress Notes (Signed)
HPI  CC: pain and double vision Patient is a 54yo female w/ a hx significant for Metastatic Colon CA and opiate dependence who was joined by her father and friend today. She has c/o double vision that started Friday after the infusion of her chemotherapy. Double vision persisted for the rest of Friday. She had another episode earlier this morning similar to the first. She denies any trauma, dizziness, HA, confusion, eye pain, photophobia, ear pain, fever, chills, SOB, CP, n/v/d, weakness, numbness. She endorses some fatigue/lethargy.  Patient also wanted to discuss her chronic pain. She says she is no longer wanting to be on the suboxone and she would like to begin pain control elsewhere. We had a long discussion about some of the issues this presents and the difficulties she will face. I informed her of the decision of our Director to no longer prescribe narcotics for her. She and her family stated their understanding. Patient stated the she has sought out narcotics from "the street" to make up for her pain management. She states she bought ~15 oxycodone tabs (10mg ) from an outside source. Patient stated that she plans to continue doing this until she can get off of the suboxone and on to better pain management.  Review of Systems   See HPI for ROS. All other systems reviewed and are negative.  Past medical history and social history reviewed and updated in the EMR as appropriate.  Objective: BP 107/78 mmHg  Pulse 96  Temp(Src) 97.6 F (36.4 C) (Oral)  Ht 5\' 5"  (1.651 m)  Wt 136 lb 12.8 oz (62.052 kg)  BMI 22.76 kg/m2 Gen: NAD, cooperative, sedated/lethargic.  HEENT: NCAT, EOMI, PERRL CV: RRR, no murmur Resp: CTAB, no wheezes, non-labored Abd: S, ND, BS present, TTP diffusely w/o a primary location, no guarding or organomegaly Ext: No edema, warm Neuro: Alert and oriented, Speech clear, No gross deficits, but very sedated affect  Assessment and plan:  Double vision Patient c/o  double vision. Symptoms appear to be waxing/waning. Denies sxs currently but had been present this AM. Unknown cause at this time. DDx medication induced diplopia (patient taking an unknown amount of an unverified medication sold to her as "oxycodone"; patient also seemed sedated; she states she believes this is from her Klonopin and/or Valium), vs. Metastatic dz (patient already w/ liver mets; brain mets are not unlikely, however would not expect wax/waning presentation)  Chronic pain disorder Worsening. Patient states she will DC her pain management from Dr. Franchot Mimes. I have placed a Pain Management Referral during this visit. My hope is that Pain Management take over her end-of-life pain management as my clinic is now closed to her for any narcotics, and oncology does not appear comfortable managing her pain and misuse either.  Polysubstance abuse Patient likely abusing Valium, Klonopin, and "street" oxycodone. This causes a SIGNIFICANT challenge for her providers. Patient's prognosis is poor. Pain will certainly be an issue for her. But patient has not proven to be trusted w/ ANY altering substances. Patient appears to have a significant support system in place. Friend and Father were both very distraught when they found out about the "street meds". I believe working w/ them may offer a source of responsibility if they were left in charge of these medications.    Orders Placed This Encounter  Procedures  . Ambulatory referral to Pain Clinic    Referral Priority:  Routine    Referral Type:  Consultation    Referral Reason:  Specialty Services Required  Requested Specialty:  Pain Medicine    Number of Visits Requested:  1    Elberta Leatherwood, MD,MS,  PGY2 02/26/2015 7:15 PM

## 2015-02-26 NOTE — Assessment & Plan Note (Signed)
Worsening. Patient states she will DC her pain management from Dr. Franchot Mimes. I have placed a Pain Management Referral during this visit. My hope is that Pain Management take over her end-of-life pain management as my clinic is now closed to her for any narcotics, and oncology does not appear comfortable managing her pain and misuse either.

## 2015-02-26 NOTE — Assessment & Plan Note (Signed)
Patient likely abusing Valium, Klonopin, and "street" oxycodone. This causes a SIGNIFICANT challenge for her providers. Patient's prognosis is poor. Pain will certainly be an issue for her. But patient has not proven to be trusted w/ ANY altering substances. Patient appears to have a significant support system in place. Friend and Father were both very distraught when they found out about the "street meds". I believe working w/ them may offer a source of responsibility if they were left in charge of these medications.

## 2015-02-26 NOTE — Assessment & Plan Note (Signed)
Patient c/o double vision. Symptoms appear to be waxing/waning. Denies sxs currently but had been present this AM. Unknown cause at this time. DDx medication induced diplopia (patient taking an unknown amount of an unverified medication sold to her as "oxycodone"; patient also seemed sedated; she states she believes this is from her Klonopin and/or Valium), vs. Metastatic dz (patient already w/ liver mets; brain mets are not unlikely, however would not expect wax/waning presentation)

## 2015-02-27 ENCOUNTER — Telehealth: Payer: Self-pay | Admitting: *Deleted

## 2015-02-27 ENCOUNTER — Other Ambulatory Visit: Payer: Self-pay | Admitting: Hematology and Oncology

## 2015-02-27 ENCOUNTER — Telehealth: Payer: Self-pay | Admitting: Hematology and Oncology

## 2015-02-27 DIAGNOSIS — C189 Malignant neoplasm of colon, unspecified: Secondary | ICD-10-CM

## 2015-02-27 NOTE — Telephone Encounter (Signed)
Confirmed appointments for 07/27

## 2015-02-27 NOTE — Telephone Encounter (Signed)
Family member called stating that patient is having sever abdominal pain/double vision. Patient states this began after her treatment on Friday and diminished. Patient double vision and abdominal pain resurfaced yesterday 02/26/15. Patient family member would like to know if she can be seen. Please advise. Message sent to MD Gorsuch/ RN Cameo.

## 2015-02-28 ENCOUNTER — Telehealth: Payer: Self-pay | Admitting: Hematology and Oncology

## 2015-02-28 ENCOUNTER — Ambulatory Visit (HOSPITAL_COMMUNITY)
Admission: RE | Admit: 2015-02-28 | Discharge: 2015-02-28 | Disposition: A | Discharge status: Home / Self Care | Payer: Medicaid Other | Source: Ambulatory Visit | Attending: Hematology and Oncology | Admitting: Hematology and Oncology

## 2015-02-28 ENCOUNTER — Encounter: Payer: Self-pay | Admitting: Hematology and Oncology

## 2015-02-28 ENCOUNTER — Ambulatory Visit (HOSPITAL_BASED_OUTPATIENT_CLINIC_OR_DEPARTMENT_OTHER): Payer: Medicaid Other | Admitting: Hematology and Oncology

## 2015-02-28 VITALS — BP 135/78 | HR 84 | Temp 97.7°F | Resp 18 | Ht 65.0 in | Wt 134.8 lb

## 2015-02-28 DIAGNOSIS — C189 Malignant neoplasm of colon, unspecified: Secondary | ICD-10-CM | POA: Insufficient documentation

## 2015-02-28 DIAGNOSIS — H532 Diplopia: Secondary | ICD-10-CM

## 2015-02-28 DIAGNOSIS — C787 Secondary malignant neoplasm of liver and intrahepatic bile duct: Secondary | ICD-10-CM | POA: Diagnosis not present

## 2015-02-28 DIAGNOSIS — R634 Abnormal weight loss: Secondary | ICD-10-CM

## 2015-02-28 DIAGNOSIS — Z87898 Personal history of other specified conditions: Secondary | ICD-10-CM

## 2015-02-28 DIAGNOSIS — C786 Secondary malignant neoplasm of retroperitoneum and peritoneum: Secondary | ICD-10-CM | POA: Diagnosis not present

## 2015-02-28 DIAGNOSIS — E162 Hypoglycemia, unspecified: Secondary | ICD-10-CM

## 2015-02-28 DIAGNOSIS — R1030 Lower abdominal pain, unspecified: Secondary | ICD-10-CM | POA: Insufficient documentation

## 2015-02-28 DIAGNOSIS — C182 Malignant neoplasm of ascending colon: Secondary | ICD-10-CM | POA: Diagnosis present

## 2015-02-28 DIAGNOSIS — G893 Neoplasm related pain (acute) (chronic): Secondary | ICD-10-CM

## 2015-02-28 DIAGNOSIS — K5909 Other constipation: Secondary | ICD-10-CM

## 2015-02-28 HISTORY — DX: Neoplasm related pain (acute) (chronic): G89.3

## 2015-02-28 MED ORDER — METHADONE HCL 10 MG PO TABS: 10.0000 mg | ORAL_TABLET | Freq: Three times a day (TID) | ORAL | Status: AC

## 2015-02-28 MED ORDER — HYDROMORPHONE HCL 8 MG PO TABS: 8.0000 mg | ORAL_TABLET | Freq: Four times a day (QID) | ORAL | Status: AC | PRN

## 2015-02-28 NOTE — Assessment & Plan Note (Signed)
She has cancer associated pain and history of narcotic abuse. She was taking suboxone but wants to follow here for chronic pain management We discussed about the role of pain management in the oncology clinic. She will likely remain on pain medications long term due to their terminal cancer.  The patient agreed to be compliant with prescribed pain regimen and promised not to share the prescribed medications. Any lost medications or missed prescription will not be refilled sooner than anticipated time when the patient's prescription is expected to run out. We also discussed narcotics refill policy in the clinic.  The patient is educated to check the pill bottles in the middle of the week and ensure there is adequate supply to last through the weekend until next appointment or available business day.  The oncology service has a strict policy not to refill pain medications after business hours or the weekend.  If the patient is found to have violated the verbal agreement as stated no further pain medications will be prescribed in the future. I will start with methadone 10 mg TID and Dilaudid 8 mg q 6 hours/prn for breakthrough pain  I will reassess pain control in her next visit

## 2015-02-28 NOTE — Assessment & Plan Note (Signed)
She has weight loss and recurrent episodes of hypoglycemia with inconsistent meals I recommend reducing Lantus by 2 units to 25 units per day from 27 units and reassess next week

## 2015-02-28 NOTE — Progress Notes (Signed)
[facility] OFFICE PROGRESS NOTE  Patient Care Team: Elberta Leatherwood, MD as PCP - General Fleet Contras, MD as Consulting Physician (Nephrology) Leatrice Jewels Rayetta Pigg, MD as Consulting Physician (Psychiatry) Heath Lark, MD as Consulting Physician (Hematology and Oncology)  SUMMARY OF ONCOLOGIC HISTORY: Oncology History   Colon cancer, KRAS positive   Primary site: Colon and Rectum (Right)   Staging method: AJCC 7th Edition   Clinical: (T4b, N2b, M1) signed by Heath Lark, MD on 08/05/2013  9:52 AM   Pathologic: Stage IVB (T4b, N2b, M1b) signed by Heath Lark, MD on 08/05/2013 10:21 AM   Summary: Stage IVB (T4b, N2b, M1b)      Colon cancer   07/15/2013 - 07/25/2013 Hospital Admission The patient presented to the hospital with pain in her abdomen and underwent surgery for colon cancer   07/15/2013 Imaging CT scan of the abdomen show bowel dilatation suspicious for malignancy   07/17/2013 Tumor Marker Preoperative CEA was 2.2, normal   07/19/2013 Procedure Colonoscopy revealed ascending colon lesion with significant stricture and biopsy showed adenocarcinoma   07/20/2013 Surgery She underwent right hemicolectomy, cholecystectomy, liver biopsy, excision of abdominal wall mass and omental mass. During surgery, she was also found to have drop metastasis   08/29/2013 Imaging Repeat CT scan show persistent liver metastasis and new omental metastasis   08/31/2013 - 02/10/2014 Chemotherapy The patient was started on palliative chemotherapy with FOLFOX, bolus 5-FU committed and oxaliplatin dose reduced due to  peripheral neuropathy. She has completed 12 cycles of treatment.   11/22/2013 Imaging Repeat CT scan showed near complete response to treatment.   11/23/2013 - 02/10/2014 Chemotherapy Avastin was added to her chemotherapy regimen   12/21/2013 Adverse Reaction Oxaliplatin was omitted due to worsening peripheral neuropathy.   02/20/2014 Imaging Repeat CT scan showed near complete resolution  of all disease.   05/15/2014 Imaging Repeat CT scan showed peritoneal metastasis and recurrence of disease   05/22/2014 -  Chemotherapy Modify dosings of FOLFOX was restarted.   07/14/2014 Imaging CT scan of the abdomen and pelvis show positive response to treatment.   09/07/2014 Imaging Repeat CT scan of the chest, abdomen and pelvis shows stable disease.   09/08/2014 Adverse Reaction She will continue treatment without oxaliplatin due to severe peripheral neuropathy.   11/30/2014 Imaging Ct scan showed significant disease progression   01/29/2015 Imaging CT scan showed significant disease progression    INTERVAL HISTORY: Please see below for problem oriented charting. She is seen urgently because of double vision, constipation and severe lower abdominal pain She saw her primary doctor 2 days ago for similar reasons She does not eat consistently and is not taking laxatives regularly She denies recent nausea  REVIEW OF SYSTEMS:   Constitutional: Denies fevers, chills  Ears, nose, mouth, throat, and face: Denies mucositis or sore throat Respiratory: Denies cough, dyspnea or wheezes Cardiovascular: Denies palpitation, chest discomfort or lower extremity swelling Skin: Denies abnormal skin rashes Lymphatics: Denies new lymphadenopathy or easy bruising Neurological:Denies numbness, tingling or new weaknesses Behavioral/Psych: Mood is stable, no new changes  All other systems were reviewed with the patient and are negative.  I have reviewed the past medical history, past surgical history, social history and family history with the patient and they are unchanged from previous note.  ALLERGIES:  is allergic to naproxen.  MEDICATIONS:  Current Outpatient Prescriptions  Medication Sig Dispense Refill  . atorvastatin (LIPITOR) 40 MG tablet Take 1 tablet (40 mg total) by mouth daily. 30 tablet 5  .  clonazePAM (KLONOPIN) 1 MG tablet Take 1 mg by mouth 2 (two) times daily.     . diazepam (VALIUM)  10 MG tablet Take 10 mg by mouth every 8 (eight) hours as needed for anxiety.    . furosemide (LASIX) 40 MG tablet Take 1 tablet (40 mg total) by mouth 2 (two) times daily. 60 tablet 5  . gabapentin (NEURONTIN) 800 MG tablet Take 1 tablet (800 mg total) by mouth 3 (three) times daily. (Patient taking differently: Take 800 mg by mouth 2 (two) times daily. ) 180 tablet 3  . hydrOXYzine (ATARAX/VISTARIL) 50 MG tablet Take 50 mg by mouth 2 (two) times daily.     . insulin lispro (HUMALOG) 100 UNIT/ML injection Inject 1-10 Units into the skin 3 (three) times daily before meals.    . lamoTRIgine (LAMICTAL) 100 MG tablet Take 200 mg by mouth every morning.     Marland Kitchen LANTUS 100 UNIT/ML injection INJECT 27 UNITS into SKIN EVERY DAY (Patient taking differently: INJECT 27 UNITS into SKIN EVERY DAY---   does in pm) 10 mL 3  . omeprazole (PRILOSEC) 20 MG capsule Take 20 mg by mouth every morning.    . polyethylene glycol powder (GLYCOLAX/MIRALAX) powder Take 17 g by mouth 2 (two) times daily as needed. (Patient taking differently: Take 17 g by mouth every morning. ) 3350 g 3  . ziprasidone (GEODON) 80 MG capsule Take 160 mg by mouth at bedtime.     Marland Kitchen HYDROmorphone (DILAUDID) 8 MG tablet Take 1 tablet (8 mg total) by mouth every 6 (six) hours as needed for severe pain. 40 tablet 0  . lidocaine-prilocaine (EMLA) cream Apply 1 application topically as needed (for pain). Apply to port a cath site one hour prior to needle stick. (Patient not taking: Reported on 02/28/2015) 30 g 2  . methadone (DOLOPHINE) 10 MG tablet Take 1 tablet (10 mg total) by mouth every 8 (eight) hours. 30 tablet 0  . senna-docusate (SENOKOT-S) 8.6-50 MG per tablet Take 1 tablet by mouth 2 (two) times daily. (Patient not taking: Reported on 02/28/2015) 90 tablet 3   No current facility-administered medications for this visit.   Facility-Administered Medications Ordered in Other Visits  Medication Dose Route Frequency Provider Last Rate Last Dose  .  sodium chloride 0.9 % injection 10 mL  10 mL Intravenous PRN Heath Lark, MD   10 mL at 09/25/14 0913    PHYSICAL EXAMINATION: ECOG PERFORMANCE STATUS: 2 - Symptomatic, <50% confined to bed  Filed Vitals:   02/28/15 0912  BP: 135/78  Pulse: 84  Temp: 97.7 F (36.5 C)  Resp: 18   Filed Weights   02/28/15 0912  Weight: 134 lb 12.8 oz (61.145 kg)    GENERAL:alert, no distress and comfortable. She is thin and somewhat sedated SKIN: skin color, texture, turgor are normal, no rashes or significant lesions EYES: normal, Conjunctiva are pink and non-injected, sclera clear OROPHARYNX:no exudate, no erythema and lips, buccal mucosa, and tongue normal  NECK: supple, thyroid normal size, non-tender, without nodularity LYMPH:  no palpable lymphadenopathy in the cervical, axillary or inguinal LUNGS: clear to auscultation and percussion with normal breathing effort HEART: regular rate & rhythm and no murmurs and no lower extremity edema ABDOMEN:abdomen soft, non-tender and normal bowel sounds. No rebound or guarding Musculoskeletal:no cyanosis of digits and no clubbing  NEURO: alert & oriented x 3 with fluent speech, no focal motor/sensory deficits  LABORATORY DATA:  I have reviewed the data as listed  Component Value Date/Time   NA 138 02/23/2015 1003   NA 135 02/12/2015 0636   K 4.5 02/23/2015 1003   K 3.5 02/12/2015 0636   CL 98 11/08/2014 1209   CO2 28 02/23/2015 1003   CO2 29 11/08/2014 1209   GLUCOSE 147* 02/23/2015 1003   GLUCOSE 252* 02/12/2015 0636   BUN 12.0 02/23/2015 1003   BUN 15 11/08/2014 1209   CREATININE 1.2* 02/23/2015 1003   CREATININE 1.11* 11/08/2014 1209   CREATININE 1.38* 05/26/2013 1703   CALCIUM 9.5 02/23/2015 1003   CALCIUM 9.1 11/08/2014 1209   PROT 7.7 02/23/2015 1003   PROT 7.1 05/14/2014 2233   ALBUMIN 2.6* 02/23/2015 1003   ALBUMIN 3.1* 05/14/2014 2233   AST 23 02/23/2015 1003   AST 28 05/14/2014 2233   ALT 12 02/23/2015 1003   ALT 18  05/14/2014 2233   ALKPHOS 191* 02/23/2015 1003   ALKPHOS 124* 05/14/2014 2233   BILITOT 0.33 02/23/2015 1003   BILITOT 0.2* 05/14/2014 2233   GFRNONAA 56* 11/08/2014 1209   GFRNONAA 44* 05/26/2013 1703   GFRAA 65* 11/08/2014 1209   GFRAA 51* 05/26/2013 1703    No results found for: SPEP, UPEP  Lab Results  Component Value Date   WBC 5.6 02/23/2015   NEUTROABS 4.0 02/23/2015   HGB 11.5* 02/23/2015   HCT 35.3 02/23/2015   MCV 87.4 02/23/2015   PLT 251 02/23/2015      Chemistry      Component Value Date/Time   NA 138 02/23/2015 1003   NA 135 02/12/2015 0636   K 4.5 02/23/2015 1003   K 3.5 02/12/2015 0636   CL 98 11/08/2014 1209   CO2 28 02/23/2015 1003   CO2 29 11/08/2014 1209   BUN 12.0 02/23/2015 1003   BUN 15 11/08/2014 1209   CREATININE 1.2* 02/23/2015 1003   CREATININE 1.11* 11/08/2014 1209   CREATININE 1.38* 05/26/2013 1703      Component Value Date/Time   CALCIUM 9.5 02/23/2015 1003   CALCIUM 9.1 11/08/2014 1209   ALKPHOS 191* 02/23/2015 1003   ALKPHOS 124* 05/14/2014 2233   AST 23 02/23/2015 1003   AST 28 05/14/2014 2233   ALT 12 02/23/2015 1003   ALT 18 05/14/2014 2233   BILITOT 0.33 02/23/2015 1003   BILITOT 0.2* 05/14/2014 2233       RADIOGRAPHIC STUDIES: I have personally reviewed the radiological images as listed and agreed with the findings in the report. Dg Abd 2 Views  02/28/2015   CLINICAL DATA:  Acute lower abdominal pain. Current history of colon cancer.  EXAM: ABDOMEN - 2 VIEW  COMPARISON:  None.  FINDINGS: The bowel gas pattern is normal. There is no evidence of free air. Status post cholecystectomy. Left ureteral stent is in grossly good position. Multiple pulmonary metastases are noted in visualized lung bases.  IMPRESSION: No evidence of bowel obstruction or ileus.   Electronically Signed   By: Marijo Conception, M.D.   On: 02/28/2015 09:00     ASSESSMENT & PLAN:  Colon cancer She tolerated treatment well. Continue supportive  care  Hypoglycemia She has weight loss and recurrent episodes of hypoglycemia with inconsistent meals I recommend reducing Lantus by 2 units to 25 units per day from 27 units and reassess next week  Double vision The visual changes could be due to widely fluctuation of blood sugar Recommend reducing insulin as above  Other constipation Multifactorial but likely due to her cancer and medication side-effects I recommend daily Miralax  and Sennokot BID  Cancer associated pain She has cancer associated pain and history of narcotic abuse. She was taking suboxone but wants to follow here for chronic pain management We discussed about the role of pain management in the oncology clinic. She will likely remain on pain medications long term due to their terminal cancer.  The patient agreed to be compliant with prescribed pain regimen and promised not to share the prescribed medications. Any lost medications or missed prescription will not be refilled sooner than anticipated time when the patient's prescription is expected to run out. We also discussed narcotics refill policy in the clinic.  The patient is educated to check the pill bottles in the middle of the week and ensure there is adequate supply to last through the weekend until next appointment or available business day.  The oncology service has a strict policy not to refill pain medications after business hours or the weekend.  If the patient is found to have violated the verbal agreement as stated no further pain medications will be prescribed in the future. I will start with methadone 10 mg TID and Dilaudid 8 mg q 6 hours/prn for breakthrough pain  I will reassess pain control in her next visit   No orders of the defined types were placed in this encounter.   All questions were answered. The patient knows to call the clinic with any problems, questions or concerns. No barriers to learning was detected. I spent 40 minutes counseling the  patient face to face. The total time spent in the appointment was 55 minutes and more than 50% was on counseling and review of test results     [facility], Takyah Ciaramitaro, MD 02/28/2015 9:34 PM

## 2015-02-28 NOTE — Assessment & Plan Note (Signed)
The visual changes could be due to widely fluctuation of blood sugar Recommend reducing insulin as above

## 2015-02-28 NOTE — Telephone Encounter (Signed)
no pof gave pt copy of avs °

## 2015-02-28 NOTE — Assessment & Plan Note (Signed)
Multifactorial but likely due to her cancer and medication side-effects I recommend daily Miralax and Sennokot BID

## 2015-02-28 NOTE — Assessment & Plan Note (Signed)
She tolerated treatment well. Continue supportive care

## 2015-03-07 ENCOUNTER — Encounter (HOSPITAL_COMMUNITY): Payer: Self-pay | Admitting: Emergency Medicine

## 2015-03-07 ENCOUNTER — Encounter: Payer: Self-pay | Admitting: Skilled Nursing Facility1

## 2015-03-07 ENCOUNTER — Inpatient Hospital Stay (HOSPITAL_COMMUNITY)
Admission: EM | Admit: 2015-03-07 | Discharge: 2015-04-05 | DRG: 637 | Disposition: E | Payer: Medicaid Other | Attending: Pulmonary Disease | Admitting: Pulmonary Disease

## 2015-03-07 DIAGNOSIS — E46 Unspecified protein-calorie malnutrition: Secondary | ICD-10-CM | POA: Diagnosis present

## 2015-03-07 DIAGNOSIS — E876 Hypokalemia: Secondary | ICD-10-CM | POA: Diagnosis present

## 2015-03-07 DIAGNOSIS — C787 Secondary malignant neoplasm of liver and intrahepatic bile duct: Secondary | ICD-10-CM | POA: Diagnosis present

## 2015-03-07 DIAGNOSIS — N183 Chronic kidney disease, stage 3 unspecified: Secondary | ICD-10-CM | POA: Diagnosis present

## 2015-03-07 DIAGNOSIS — G8929 Other chronic pain: Secondary | ICD-10-CM | POA: Diagnosis present

## 2015-03-07 DIAGNOSIS — Z66 Do not resuscitate: Secondary | ICD-10-CM

## 2015-03-07 DIAGNOSIS — C7800 Secondary malignant neoplasm of unspecified lung: Secondary | ICD-10-CM | POA: Diagnosis present

## 2015-03-07 DIAGNOSIS — J690 Pneumonitis due to inhalation of food and vomit: Secondary | ICD-10-CM | POA: Diagnosis present

## 2015-03-07 DIAGNOSIS — F1120 Opioid dependence, uncomplicated: Secondary | ICD-10-CM | POA: Diagnosis present

## 2015-03-07 DIAGNOSIS — F17210 Nicotine dependence, cigarettes, uncomplicated: Secondary | ICD-10-CM | POA: Diagnosis present

## 2015-03-07 DIAGNOSIS — C799 Secondary malignant neoplasm of unspecified site: Secondary | ICD-10-CM | POA: Insufficient documentation

## 2015-03-07 DIAGNOSIS — E109 Type 1 diabetes mellitus without complications: Secondary | ICD-10-CM | POA: Diagnosis present

## 2015-03-07 DIAGNOSIS — C182 Malignant neoplasm of ascending colon: Secondary | ICD-10-CM | POA: Diagnosis present

## 2015-03-07 DIAGNOSIS — I129 Hypertensive chronic kidney disease with stage 1 through stage 4 chronic kidney disease, or unspecified chronic kidney disease: Secondary | ICD-10-CM | POA: Diagnosis present

## 2015-03-07 DIAGNOSIS — B1920 Unspecified viral hepatitis C without hepatic coma: Secondary | ICD-10-CM | POA: Diagnosis present

## 2015-03-07 DIAGNOSIS — N179 Acute kidney failure, unspecified: Secondary | ICD-10-CM | POA: Diagnosis not present

## 2015-03-07 DIAGNOSIS — K589 Irritable bowel syndrome without diarrhea: Secondary | ICD-10-CM | POA: Diagnosis present

## 2015-03-07 DIAGNOSIS — Z794 Long term (current) use of insulin: Secondary | ICD-10-CM

## 2015-03-07 DIAGNOSIS — Z91128 Patient's intentional underdosing of medication regimen for other reason: Secondary | ICD-10-CM | POA: Diagnosis present

## 2015-03-07 DIAGNOSIS — E1110 Type 2 diabetes mellitus with ketoacidosis without coma: Secondary | ICD-10-CM | POA: Diagnosis present

## 2015-03-07 DIAGNOSIS — G893 Neoplasm related pain (acute) (chronic): Secondary | ICD-10-CM | POA: Diagnosis present

## 2015-03-07 DIAGNOSIS — Z515 Encounter for palliative care: Secondary | ICD-10-CM

## 2015-03-07 DIAGNOSIS — T383X6A Underdosing of insulin and oral hypoglycemic [antidiabetic] drugs, initial encounter: Secondary | ICD-10-CM | POA: Diagnosis present

## 2015-03-07 DIAGNOSIS — E1040 Type 1 diabetes mellitus with diabetic neuropathy, unspecified: Secondary | ICD-10-CM | POA: Diagnosis present

## 2015-03-07 DIAGNOSIS — R410 Disorientation, unspecified: Secondary | ICD-10-CM | POA: Insufficient documentation

## 2015-03-07 DIAGNOSIS — Y92019 Unspecified place in single-family (private) house as the place of occurrence of the external cause: Secondary | ICD-10-CM

## 2015-03-07 DIAGNOSIS — E785 Hyperlipidemia, unspecified: Secondary | ICD-10-CM | POA: Diagnosis present

## 2015-03-07 DIAGNOSIS — E1010 Type 1 diabetes mellitus with ketoacidosis without coma: Principal | ICD-10-CM | POA: Insufficient documentation

## 2015-03-07 DIAGNOSIS — J9601 Acute respiratory failure with hypoxia: Secondary | ICD-10-CM | POA: Insufficient documentation

## 2015-03-07 DIAGNOSIS — C189 Malignant neoplasm of colon, unspecified: Secondary | ICD-10-CM | POA: Diagnosis present

## 2015-03-07 DIAGNOSIS — Z452 Encounter for adjustment and management of vascular access device: Secondary | ICD-10-CM

## 2015-03-07 DIAGNOSIS — F419 Anxiety disorder, unspecified: Secondary | ICD-10-CM | POA: Diagnosis present

## 2015-03-07 DIAGNOSIS — F319 Bipolar disorder, unspecified: Secondary | ICD-10-CM | POA: Diagnosis present

## 2015-03-07 DIAGNOSIS — G9341 Metabolic encephalopathy: Secondary | ICD-10-CM | POA: Diagnosis present

## 2015-03-07 DIAGNOSIS — Z915 Personal history of self-harm: Secondary | ICD-10-CM

## 2015-03-07 DIAGNOSIS — Z79891 Long term (current) use of opiate analgesic: Secondary | ICD-10-CM

## 2015-03-07 DIAGNOSIS — R000 Tachycardia, unspecified: Secondary | ICD-10-CM | POA: Diagnosis present

## 2015-03-07 LAB — CBG MONITORING, ED

## 2015-03-07 NOTE — ED Notes (Signed)
RN will access pt port to get blood for labs

## 2015-03-07 NOTE — ED Provider Notes (Addendum)
CSN: [id number]   Arrival date & time 03/24/2015 2335  History  This chart was scribed for  Linton Flemings, MD by Altamease Oiler, ED Scribe. This patient was seen in room WA16/WA16 and the patient's care was started at 11:49 PM.  No chief complaint on file.   HPI The history is provided by the patient. No language interpreter was used.    Level V caveat due to AMS.  Monica [name] is a 54 y.o. female with PMHx of colon cancer, DM, HTN, depression, anxiety, and bipolar disorder who presents to the Emergency Department complaining of hyperglycemia. Pt has not administered her insulin in at least 4 days stating "haven't felt like it". She denies any intention to harm herself. Also complains of dry cough. AMS for 3 days per EMS.   Past Medical History  Diagnosis Date  . Depression   . Complication of anesthesia     Difficulty putting to sllep- done at [facility]  . Anxiety   . IBS (irritable bowel syndrome)   . PONV (postoperative nausea and vomiting)   . Dysmenorrhea 09/05/2013  . Leukopenia due to antineoplastic chemotherapy 09/28/2013  . Thrombocytopenia due to drugs 09/28/2013  . Neuropathy, diabetic   . Bipolar disorder     HX PSYCHOTIC EPISODE  . Chronic pain disorder   . Hepatitis C     SECONDARY TO HX V DRUG USE  . Colon cancer DX 07/22/2013    W/ METS TO LIVER--  RIGHT SIDE ,  Stage IV (T4N2M1)  . Metastatic adenocarcinoma to liver     PRIMARY -- COLORECTAL   . Hydronephrosis, left   . Hypertension   . History of drug overdose     severel times  . History of suicide attempt   . Insulin dependent type 2 diabetes mellitus   . Short-term memory loss     due to chemo  . Cancer associated pain 02/28/2015    Past Surgical History  Procedure Laterality Date  . Cesarean section  1991  &  05-15-2000    Bilateral Tubal Ligation with last one  . Colonoscopy N/A 07/19/2013    Procedure: COLONOSCOPY;  Surgeon: Beryle Beams, MD;  Location: [place];  Service: Endoscopy;  Laterality:  N/A;  . Partial colectomy Right 07/20/2013    Procedure: PARTIAL COLECTOMY;  Surgeon: Zenovia Jarred, MD;  Location: [place];  Service: General;  Laterality: Right;  . Cholecystectomy N/A 07/20/2013    Procedure: CHOLECYSTECTOMY;  Surgeon: Zenovia Jarred, MD;  Location: [place];  Service: General;  Laterality: N/A;  . Portacath placement Left 08/12/2013    Procedure: INSERTION PORT-A-CATH;  Surgeon: Zenovia Jarred, MD;  Location: [place];  Service: General;  Laterality: Left;  . Removal fatty tumor left interior left knee  07-13-2013  . Cystoscopy w/ ureteral stent placement Left 02/12/2015    Procedure: CYSTOSCOPY WITH LEFT RETROGRADE PYELOGRAM/URETERAL STENT PLACEMENT;  Surgeon: Rana Snare, MD;  Location: [facility];  Service: Urology;  Laterality: Left;  30 MIN Walker [id number]    History reviewed. No pertinent family history.  History  Substance Use Topics  . Smoking status: Current Every Day Smoker -- 0.50 packs/day for 20 years    Types: Cigarettes  . Smokeless tobacco: Never Used     Comment: 1 pp3d  . Alcohol Use: No     Review of Systems  Respiratory: Positive for cough.   Neurological:       AMS  Psychiatric/Behavioral: Negative for suicidal  ideas and self-injury.     Home Medications   Prior to Admission medications   Medication Sig Start Date End Date Taking? Authorizing Provider  atorvastatin (LIPITOR) 40 MG tablet Take 1 tablet (40 mg total) by mouth daily. 08/27/14   Elberta Leatherwood, MD  clonazePAM (KLONOPIN) 1 MG tablet Take 1 mg by mouth 2 (two) times daily.     Historical Provider, MD  diazepam (VALIUM) 10 MG tablet Take 10 mg by mouth every 8 (eight) hours as needed for anxiety.    Historical Provider, MD  furosemide (LASIX) 40 MG tablet Take 1 tablet (40 mg total) by mouth 2 (two) times daily. 09/28/12   Angelica Ran, MD  gabapentin (NEURONTIN) 800 MG tablet Take 1 tablet (800 mg total) by mouth 3 (three) times  daily. Patient taking differently: Take 800 mg by mouth 2 (two) times daily.  12/26/14   Elberta Leatherwood, MD  HYDROmorphone (DILAUDID) 8 MG tablet Take 1 tablet (8 mg total) by mouth every 6 (six) hours as needed for severe pain. 02/28/15   Heath Lark, MD  hydrOXYzine (ATARAX/VISTARIL) 50 MG tablet Take 50 mg by mouth 2 (two) times daily.     Historical Provider, MD  insulin lispro (HUMALOG) 100 UNIT/ML injection Inject 1-10 Units into the skin 3 (three) times daily before meals.    Historical Provider, MD  lamoTRIgine (LAMICTAL) 100 MG tablet Take 200 mg by mouth every morning.     Historical Provider, MD  LANTUS 100 UNIT/ML injection INJECT 27 UNITS into SKIN EVERY DAY Patient taking differently: INJECT 27 UNITS into SKIN EVERY DAY---   does in pm 11/09/14   Elberta Leatherwood, MD  lidocaine-prilocaine (EMLA) cream Apply 1 application topically as needed (for pain). Apply to port a cath site one hour prior to needle stick. Patient not taking: Reported on 02/28/2015 12/18/14   Heath Lark, MD  methadone (DOLOPHINE) 10 MG tablet Take 1 tablet (10 mg total) by mouth every 8 (eight) hours. 02/28/15   Heath Lark, MD  omeprazole (PRILOSEC) 20 MG capsule Take 20 mg by mouth every morning.    Historical Provider, MD  polyethylene glycol powder (GLYCOLAX/MIRALAX) powder Take 17 g by mouth 2 (two) times daily as needed. Patient taking differently: Take 17 g by mouth every morning.  04/05/14   Elberta Leatherwood, MD  senna-docusate (SENOKOT-S) 8.6-50 MG per tablet Take 1 tablet by mouth 2 (two) times daily. Patient not taking: Reported on 02/28/2015 04/20/14   Heath Lark, MD  ziprasidone (GEODON) 80 MG capsule Take 160 mg by mouth at bedtime.     Historical Provider, MD    Allergies  Naproxen  Triage Vitals: SpO2 97%  LMP 12/05/2014  Physical Exam  Constitutional: She appears well-developed and well-nourished. She appears distressed.  HENT:  Head: Normocephalic and atraumatic.  Nose: Nose normal.  Dry mucous membranes   Eyes: Conjunctivae and EOM are normal. Pupils are equal, round, and reactive to light.  Neck: Normal range of motion. Neck supple. No JVD present. No tracheal deviation present. No thyromegaly present.  Cardiovascular: Normal rate, regular rhythm, normal heart sounds and intact distal pulses.  Exam reveals no gallop and no friction rub.   No murmur heard. Pulmonary/Chest: Effort normal and breath sounds normal. No stridor. No respiratory distress. She has no wheezes. She has no rales. She exhibits no tenderness.  Patient has crackles in left lung, tachypnea  Abdominal: Soft. Bowel sounds are normal. She exhibits no distension and no mass.  There is no tenderness. There is no rebound and no guarding.  Musculoskeletal: Normal range of motion. She exhibits no edema or tenderness.  Lymphadenopathy:    She has no cervical adenopathy.  Neurological: She displays normal reflexes. She exhibits normal muscle tone. Coordination normal.  Patient appears confused, irritable  Skin: Skin is warm. No rash noted. She is diaphoretic. No erythema. No pallor.  Nursing note and vitals reviewed.   ED Course  Procedures   DIAGNOSTIC STUDIES: Oxygen Saturation is 97% on RA, normal by my interpretation.    COORDINATION OF CARE: 11:53 PM Treatment plan includes lab work.   Labs Review-  Labs Reviewed  BASIC METABOLIC PANEL - Abnormal; Notable for the following:    Chloride 98 (*)    CO2 6 (*)    Glucose, Bld 695 (*)    BUN 36 (*)    Creatinine, Ser 1.62 (*)    GFR calc non Af Amer 35 (*)    GFR calc Af Amer 41 (*)    Anion gap 32 (*)    All other components within normal limits  CBC - Abnormal; Notable for the following:    WBC 19.5 (*)    Hemoglobin 11.5 (*)    HCT 35.7 (*)    RDW 15.7 (*)    Platelets 410 (*)    All other components within normal limits  URINALYSIS, ROUTINE W REFLEX MICROSCOPIC (NOT AT [facility]) - Abnormal; Notable for the following:    APPearance CLOUDY (*)    Glucose, UA >1000  (*)    Hgb urine dipstick LARGE (*)    Ketones, ur >80 (*)    Protein, ur 30 (*)    All other components within normal limits  BLOOD GAS, VENOUS - Abnormal; Notable for the following:    pH, Ven 7.135 (*)    pCO2, Ven 17.0 (*)    pO2, Ven 57.5 (*)    Bicarbonate 5.5 (*)    Acid-base deficit 22.6 (*)    All other components within normal limits  URINE RAPID DRUG SCREEN, HOSP PERFORMED - Abnormal; Notable for the following:    Benzodiazepines POSITIVE (*)    All other components within normal limits  HEPATIC FUNCTION PANEL - Abnormal; Notable for the following:    Albumin 3.1 (*)    Alkaline Phosphatase 206 (*)    Total Bilirubin 2.2 (*)    Indirect Bilirubin 1.7 (*)    All other components within normal limits  URINE MICROSCOPIC-ADD ON - Abnormal; Notable for the following:    Squamous Epithelial / LPF FEW (*)    Casts HYALINE CASTS (*)    All other components within normal limits  CBG MONITORING, ED - Abnormal; Notable for the following:    Glucose-Capillary >600 (*)    All other components within normal limits  CBG MONITORING, ED - Abnormal; Notable for the following:    Glucose-Capillary 589 (*)    All other components within normal limits  CBG MONITORING, ED - Abnormal; Notable for the following:    Glucose-Capillary 517 (*)    All other components within normal limits  I-STAT CG4 LACTIC ACID, ED    Imaging Review Dg Chest Port 1 View  03/08/2015   CLINICAL DATA:  Crackles at the left lung field. Hyperglycemia. Current history of colon cancer. Initial encounter.  EXAM: PORTABLE CHEST - 1 VIEW  COMPARISON:  Chest radiograph performed 01/11/2014, and CT of the chest performed 01/26/2015  FINDINGS: There has been rapid interval progression of  diffuse metastatic disease to both lungs, with extensive nodules and masses noted throughout both lungs. No definite superimposed pneumonia is seen, though it cannot be entirely excluded. No pleural effusion or pneumothorax identified.  The  cardiomediastinal silhouette is normal in size. Prominence of the superior mediastinum likely reflects mediastinal lymphadenopathy as previously noted. This is more prominent than on the prior CT. A left-sided chest port is noted ending about the mid SVC. No acute osseous abnormalities are identified.  IMPRESSION: Rapid interval progression of diffuse metastatic disease to the lungs, with extensive nodules and masses noted throughout both lungs. No definite superimposed pneumonia seen, though it cannot be entirely excluded. Increased prominence of the superior mediastinum likely reflects worsening mediastinal lymphadenopathy.   Electronically Signed   By: Garald Balding M.D.   On: 03/08/2015 00:56    EKG Interpretation  Date/Time:    Ventricular Rate:    PR Interval:    QRS Duration:   QT Interval:    QTC Calculation:   R Axis:     Text Interpretation:       CRITICAL CARE Performed by: Kalman Drape Total critical care time: 60 min Critical care time was exclusive of separately billable procedures and treating other patients. Critical care was necessary to treat or prevent imminent or life-threatening deterioration. Critical care was time spent personally by me on the following activities: development of treatment plan with patient and/or surrogate as well as nursing, discussions with consultants, evaluation of patient's response to treatment, examination of patient, obtaining history from patient or surrogate, ordering and performing treatments and interventions, ordering and review of laboratory studies, ordering and review of radiographic studies, pulse oximetry and re-evaluation of patient's condition.   MDM   Final diagnoses:  Confusion  Diabetic ketoacidosis without coma associated with type 1 diabetes mellitus  Metastatic cancer     I personally performed the services described in this documentation, which was scribed in my presence. The recorded information has been reviewed  and is accurate.  54 year old female presents to emergency department confused, ill-appearing.  Per report, she has not been using her insulin for at least 3-4 days, and there is concern for drug abuse.  Patient is unable to give any further history due to her confusion.  Reading her last oncology note, recently started on methadone and Dilaudid for pain, chemotherapy on Friday.  Has terminal colon cancer.  Patient is a type I diabetic, concern for DKA.  She is to receive IV fluids, labs.  Expect admission    Linton Flemings, MD 03/08/15 0159  5:18 AM Pt with prolonged ED stay after accepted admission due to bed limitations at [facility].  Pt with persistent tachypnea, increasing confusion, agitation, and new hypoxia from admission.  Pt on venti-mask at 50%, holding sats at 90%.  Pt has received ativan and morphine for agitation and possible pain/withdrawal symptoms.  Repeat BMP not much improved, still with large anion gap.  Case was d/w Dr Stevenson Clinch with critical care, who does not feel patient meets ICU criteria at this time.  Pt has received inpatient bed assignment.  Accepting FP team updated on current condition and ongoing interventions.  Linton Flemings, MD 03/08/15 [phone]

## 2015-03-07 NOTE — Progress Notes (Signed)
Subjective:     Patient ID: Monica [name], female   DOB: [birth year], 54 y.o.   MRN: [id number]  HPI   Review of Systems     Objective:   Physical Exam To assist the pt in identifying some dietary strategies to gain some lost wt back.    Assessment:     Pt identified as being malnourished due to some lost wt. Pt contacted via the telephone at [phone]. Pt was unavailable due to some phone issues but Vilinda Blanks answered the phone. Hilda Blades states they are doing everything they can to get the pt to eat. Hilda Blades states there is nothing more they can do to force her to eat.    Plan:     Dietitian explained the importance of a relaxed environment for eating for the pt.

## 2015-03-07 NOTE — ED Notes (Signed)
Pt from home has not taken her insulin for 4 days per EMS. According to pt's family on the scene, pt has been abusing drugs. Pt's last chemo treatment was on Friday (she has colon cancer). Pt has had altered mental status for about 3 days/

## 2015-03-07 NOTE — ED Notes (Signed)
Bed: RC16 Expected date:  Expected time:  Means of arrival:  Comments: EMS 54 yo altered LOC/hyperglycemia/hx cancer

## 2015-03-08 ENCOUNTER — Emergency Department (HOSPITAL_COMMUNITY): Payer: Medicaid Other

## 2015-03-08 ENCOUNTER — Inpatient Hospital Stay (HOSPITAL_COMMUNITY): Payer: Medicaid Other

## 2015-03-08 DIAGNOSIS — Z91128 Patient's intentional underdosing of medication regimen for other reason: Secondary | ICD-10-CM | POA: Diagnosis present

## 2015-03-08 DIAGNOSIS — J690 Pneumonitis due to inhalation of food and vomit: Secondary | ICD-10-CM | POA: Diagnosis present

## 2015-03-08 DIAGNOSIS — E1311 Other specified diabetes mellitus with ketoacidosis with coma: Secondary | ICD-10-CM

## 2015-03-08 DIAGNOSIS — N183 Chronic kidney disease, stage 3 (moderate): Secondary | ICD-10-CM | POA: Diagnosis present

## 2015-03-08 DIAGNOSIS — C7800 Secondary malignant neoplasm of unspecified lung: Secondary | ICD-10-CM | POA: Diagnosis present

## 2015-03-08 DIAGNOSIS — Y92019 Unspecified place in single-family (private) house as the place of occurrence of the external cause: Secondary | ICD-10-CM | POA: Diagnosis not present

## 2015-03-08 DIAGNOSIS — C801 Malignant (primary) neoplasm, unspecified: Secondary | ICD-10-CM | POA: Diagnosis not present

## 2015-03-08 DIAGNOSIS — R4182 Altered mental status, unspecified: Secondary | ICD-10-CM | POA: Diagnosis present

## 2015-03-08 DIAGNOSIS — C182 Malignant neoplasm of ascending colon: Secondary | ICD-10-CM | POA: Diagnosis present

## 2015-03-08 DIAGNOSIS — Z66 Do not resuscitate: Secondary | ICD-10-CM | POA: Diagnosis not present

## 2015-03-08 DIAGNOSIS — F17210 Nicotine dependence, cigarettes, uncomplicated: Secondary | ICD-10-CM | POA: Diagnosis present

## 2015-03-08 DIAGNOSIS — E1110 Type 2 diabetes mellitus with ketoacidosis without coma: Secondary | ICD-10-CM | POA: Diagnosis present

## 2015-03-08 DIAGNOSIS — G9341 Metabolic encephalopathy: Secondary | ICD-10-CM | POA: Diagnosis present

## 2015-03-08 DIAGNOSIS — C787 Secondary malignant neoplasm of liver and intrahepatic bile duct: Secondary | ICD-10-CM | POA: Diagnosis present

## 2015-03-08 DIAGNOSIS — I129 Hypertensive chronic kidney disease with stage 1 through stage 4 chronic kidney disease, or unspecified chronic kidney disease: Secondary | ICD-10-CM | POA: Diagnosis present

## 2015-03-08 DIAGNOSIS — Z79891 Long term (current) use of opiate analgesic: Secondary | ICD-10-CM | POA: Diagnosis not present

## 2015-03-08 DIAGNOSIS — E785 Hyperlipidemia, unspecified: Secondary | ICD-10-CM | POA: Diagnosis present

## 2015-03-08 DIAGNOSIS — E1040 Type 1 diabetes mellitus with diabetic neuropathy, unspecified: Secondary | ICD-10-CM | POA: Diagnosis present

## 2015-03-08 DIAGNOSIS — F319 Bipolar disorder, unspecified: Secondary | ICD-10-CM | POA: Diagnosis present

## 2015-03-08 DIAGNOSIS — N179 Acute kidney failure, unspecified: Secondary | ICD-10-CM | POA: Diagnosis not present

## 2015-03-08 DIAGNOSIS — R000 Tachycardia, unspecified: Secondary | ICD-10-CM | POA: Diagnosis present

## 2015-03-08 DIAGNOSIS — E1010 Type 1 diabetes mellitus with ketoacidosis without coma: Secondary | ICD-10-CM | POA: Insufficient documentation

## 2015-03-08 DIAGNOSIS — E46 Unspecified protein-calorie malnutrition: Secondary | ICD-10-CM | POA: Diagnosis present

## 2015-03-08 DIAGNOSIS — J9601 Acute respiratory failure with hypoxia: Secondary | ICD-10-CM | POA: Insufficient documentation

## 2015-03-08 DIAGNOSIS — T383X6A Underdosing of insulin and oral hypoglycemic [antidiabetic] drugs, initial encounter: Secondary | ICD-10-CM | POA: Diagnosis present

## 2015-03-08 DIAGNOSIS — Z794 Long term (current) use of insulin: Secondary | ICD-10-CM | POA: Diagnosis not present

## 2015-03-08 DIAGNOSIS — F1120 Opioid dependence, uncomplicated: Secondary | ICD-10-CM | POA: Diagnosis present

## 2015-03-08 DIAGNOSIS — C799 Secondary malignant neoplasm of unspecified site: Secondary | ICD-10-CM | POA: Insufficient documentation

## 2015-03-08 DIAGNOSIS — Z515 Encounter for palliative care: Secondary | ICD-10-CM | POA: Diagnosis not present

## 2015-03-08 DIAGNOSIS — R410 Disorientation, unspecified: Secondary | ICD-10-CM | POA: Insufficient documentation

## 2015-03-08 DIAGNOSIS — G8929 Other chronic pain: Secondary | ICD-10-CM | POA: Diagnosis present

## 2015-03-08 DIAGNOSIS — C189 Malignant neoplasm of colon, unspecified: Secondary | ICD-10-CM

## 2015-03-08 DIAGNOSIS — E876 Hypokalemia: Secondary | ICD-10-CM | POA: Diagnosis present

## 2015-03-08 DIAGNOSIS — Z915 Personal history of self-harm: Secondary | ICD-10-CM | POA: Diagnosis not present

## 2015-03-08 DIAGNOSIS — K589 Irritable bowel syndrome without diarrhea: Secondary | ICD-10-CM | POA: Diagnosis present

## 2015-03-08 DIAGNOSIS — G893 Neoplasm related pain (acute) (chronic): Secondary | ICD-10-CM | POA: Diagnosis present

## 2015-03-08 DIAGNOSIS — B1920 Unspecified viral hepatitis C without hepatic coma: Secondary | ICD-10-CM | POA: Diagnosis present

## 2015-03-08 DIAGNOSIS — F419 Anxiety disorder, unspecified: Secondary | ICD-10-CM | POA: Diagnosis present

## 2015-03-08 LAB — CBC
HCT: 35.7 % — ABNORMAL LOW (ref 36.0–46.0)
Hemoglobin: 11.5 g/dL — ABNORMAL LOW (ref 12.0–15.0)
MCH: 28.5 pg (ref 26.0–34.0)
MCHC: 32.2 g/dL (ref 30.0–36.0)
MCV: 88.6 fL (ref 78.0–100.0)
Platelets: 410 10*3/uL — ABNORMAL HIGH (ref 150–400)
RBC: 4.03 MIL/uL (ref 3.87–5.11)
RDW: 15.7 % — ABNORMAL HIGH (ref 11.5–15.5)
WBC: 19.5 10*3/uL — ABNORMAL HIGH (ref 4.0–10.5)

## 2015-03-08 LAB — BASIC METABOLIC PANEL
ANION GAP: 25 — AB (ref 5–15)
Anion gap: 32 — ABNORMAL HIGH (ref 5–15)
Anion gap: 13 (ref 5–15)
Anion gap: 7 (ref 5–15)
Anion gap: 9 (ref 5–15)
BUN: 36 mg/dL — ABNORMAL HIGH (ref 6–20)
BUN: 39 mg/dL — AB (ref 6–20)
BUN: 34 mg/dL — AB (ref 6–20)
BUN: 34 mg/dL — ABNORMAL HIGH (ref 6–20)
BUN: 30 mg/dL — ABNORMAL HIGH (ref 6–20)
CALCIUM: 8.4 mg/dL — AB (ref 8.9–10.3)
CALCIUM: 8.4 mg/dL — AB (ref 8.9–10.3)
CALCIUM: 9 mg/dL (ref 8.9–10.3)
CALCIUM: 9.2 mg/dL (ref 8.9–10.3)
CHLORIDE: 116 mmol/L — AB (ref 101–111)
CO2: 6 mmol/L — ABNORMAL LOW (ref 22–32)
CO2: 9 mmol/L — AB (ref 22–32)
CO2: 17 mmol/L — ABNORMAL LOW (ref 22–32)
CO2: 20 mmol/L — ABNORMAL LOW (ref 22–32)
CO2: 17 mmol/L — ABNORMAL LOW (ref 22–32)
CREATININE: 1.46 mg/dL — AB (ref 0.44–1.00)
CREATININE: 1.59 mg/dL — AB (ref 0.44–1.00)
CREATININE: 1.62 mg/dL — AB (ref 0.44–1.00)
Calcium: 8.9 mg/dL (ref 8.9–10.3)
Chloride: 98 mmol/L — ABNORMAL LOW (ref 101–111)
Chloride: 106 mmol/L (ref 101–111)
Chloride: 112 mmol/L — ABNORMAL HIGH (ref 101–111)
Chloride: 112 mmol/L — ABNORMAL HIGH (ref 101–111)
Creatinine, Ser: 1.22 mg/dL — ABNORMAL HIGH (ref 0.44–1.00)
Creatinine, Ser: 1.24 mg/dL — ABNORMAL HIGH (ref 0.44–1.00)
GFR calc Af Amer: 41 mL/min — ABNORMAL LOW (ref >60)
GFR calc Af Amer: 42 mL/min — ABNORMAL LOW (ref >60)
GFR calc Af Amer: 58 mL/min — ABNORMAL LOW (ref >60)
GFR calc Af Amer: 56 mL/min — ABNORMAL LOW (ref >60)
GFR calc non Af Amer: 35 mL/min — ABNORMAL LOW (ref >60)
GFR calc non Af Amer: 36 mL/min — ABNORMAL LOW (ref >60)
GFR calc non Af Amer: 40 mL/min — ABNORMAL LOW (ref >60)
GFR calc non Af Amer: 49 mL/min — ABNORMAL LOW (ref >60)
GFR, EST AFRICAN AMERICAN: 46 mL/min — AB (ref >60)
GFR, EST NON AFRICAN AMERICAN: 50 mL/min — AB (ref >60)
Glucose, Bld: 695 mg/dL (ref 65–99)
Glucose, Bld: 503 mg/dL — ABNORMAL HIGH (ref 65–99)
Glucose, Bld: 245 mg/dL — ABNORMAL HIGH (ref 65–99)
Glucose, Bld: 98 mg/dL (ref 65–99)
Glucose, Bld: 124 mg/dL — ABNORMAL HIGH (ref 65–99)
POTASSIUM: 3.4 mmol/L — AB (ref 3.5–5.1)
POTASSIUM: 3.5 mmol/L (ref 3.5–5.1)
Potassium: 4.5 mmol/L (ref 3.5–5.1)
Potassium: 3.8 mmol/L (ref 3.5–5.1)
Potassium: 3.3 mmol/L — ABNORMAL LOW (ref 3.5–5.1)
SODIUM: 140 mmol/L (ref 135–145)
SODIUM: 142 mmol/L (ref 135–145)
SODIUM: 138 mmol/L (ref 135–145)
Sodium: 136 mmol/L (ref 135–145)
Sodium: 143 mmol/L (ref 135–145)

## 2015-03-08 LAB — URINALYSIS, ROUTINE W REFLEX MICROSCOPIC
BILIRUBIN URINE: NEGATIVE
Glucose, UA: >1000 mg/dL — AB
Ketones, ur: >80 mg/dL — AB
Leukocytes, UA: NEGATIVE
Nitrite: NEGATIVE
PH: 5 (ref 5.0–8.0)
Protein, ur: 30 mg/dL — AB
SPECIFIC GRAVITY, URINE: 1.021 (ref 1.005–1.030)
Urobilinogen, UA: 0.2 mg/dL (ref 0.0–1.0)

## 2015-03-08 LAB — BLOOD GAS, ARTERIAL
ACID-BASE DEFICIT: 6.7 mmol/L — AB (ref 0.0–2.0)
Acid-base deficit: 6.9 mmol/L — ABNORMAL HIGH (ref 0.0–2.0)
BICARBONATE: 19.5 meq/L — AB (ref 20.0–24.0)
Bicarbonate: 19.8 mEq/L — ABNORMAL LOW (ref 20.0–24.0)
Drawn by: 301361
Drawn by: 301361
FIO2: 1
FIO2: 0.8
LHR: 30 {breaths}/min
LHR: 30 {breaths}/min
MECHVT: 350 mL
O2 SAT: 92 %
O2 Saturation: 97.1 %
PATIENT TEMPERATURE: 98.6
PCO2 ART: 52.6 mmHg — AB (ref 35.0–45.0)
PEEP/CPAP: 10 cmH2O
PEEP: 10 cmH2O
PH ART: 7.201 — AB (ref 7.350–7.450)
PH ART: 7.237 — AB (ref 7.350–7.450)
PO2 ART: 98 mmHg (ref 80.0–100.0)
Patient temperature: 98.6
Pressure control: 25 cmH2O
TCO2: 21.4 mmol/L (ref 0–100)
TCO2: 20.9 mmol/L (ref 0–100)
pCO2 arterial: 47.5 mmHg — ABNORMAL HIGH (ref 35.0–45.0)
pO2, Arterial: 67.7 mmHg — ABNORMAL LOW (ref 80.0–100.0)

## 2015-03-08 LAB — GLUCOSE, CAPILLARY
GLUCOSE-CAPILLARY: 239 mg/dL — AB (ref 65–99)
GLUCOSE-CAPILLARY: 194 mg/dL — AB (ref 65–99)
GLUCOSE-CAPILLARY: 154 mg/dL — AB (ref 65–99)
GLUCOSE-CAPILLARY: 104 mg/dL — AB (ref 65–99)
GLUCOSE-CAPILLARY: 109 mg/dL — AB (ref 65–99)
Glucose-Capillary: 269 mg/dL — ABNORMAL HIGH (ref 65–99)
Glucose-Capillary: 90 mg/dL (ref 65–99)
Glucose-Capillary: 87 mg/dL (ref 65–99)
Glucose-Capillary: 164 mg/dL — ABNORMAL HIGH (ref 65–99)
Glucose-Capillary: 140 mg/dL — ABNORMAL HIGH (ref 65–99)
Glucose-Capillary: 120 mg/dL — ABNORMAL HIGH (ref 65–99)
Glucose-Capillary: 126 mg/dL — ABNORMAL HIGH (ref 65–99)
Glucose-Capillary: 165 mg/dL — ABNORMAL HIGH (ref 65–99)

## 2015-03-08 LAB — URINE MICROSCOPIC-ADD ON

## 2015-03-08 LAB — CBG MONITORING, ED
GLUCOSE-CAPILLARY: 589 mg/dL — AB (ref 65–99)
GLUCOSE-CAPILLARY: 517 mg/dL — AB (ref 65–99)
Glucose-Capillary: 513 mg/dL — ABNORMAL HIGH (ref 65–99)
Glucose-Capillary: 442 mg/dL — ABNORMAL HIGH (ref 65–99)
Glucose-Capillary: 347 mg/dL — ABNORMAL HIGH (ref 65–99)

## 2015-03-08 LAB — BLOOD GAS, VENOUS
Acid-base deficit: 22.6 mmol/L — ABNORMAL HIGH (ref 0.0–2.0)
Bicarbonate: 5.5 mEq/L — ABNORMAL LOW (ref 20.0–24.0)
FIO2: 0.21
O2 Saturation: 81.1 %
PATIENT TEMPERATURE: 98.6
PCO2 VEN: 17 mmHg — AB (ref 45.0–50.0)
PH VEN: 7.135 — AB (ref 7.250–7.300)
PO2 VEN: 57.5 mmHg — AB (ref 30.0–45.0)
TCO2: 5.3 mmol/L (ref 0–100)

## 2015-03-08 LAB — HEPATIC FUNCTION PANEL
ALK PHOS: 206 U/L — AB (ref 38–126)
ALT: 22 U/L (ref 14–54)
AST: 22 U/L (ref 15–41)
Albumin: 3.1 g/dL — ABNORMAL LOW (ref 3.5–5.0)
BILIRUBIN INDIRECT: 1.7 mg/dL — AB (ref 0.3–0.9)
BILIRUBIN TOTAL: 2.2 mg/dL — AB (ref 0.3–1.2)
Bilirubin, Direct: 0.5 mg/dL (ref 0.1–0.5)
Total Protein: 7.8 g/dL (ref 6.5–8.1)

## 2015-03-08 LAB — I-STAT CG4 LACTIC ACID, ED: Lactic Acid, Venous: 1.91 mmol/L (ref 0.5–2.0)

## 2015-03-08 LAB — TRIGLYCERIDES: TRIGLYCERIDES: 133 mg/dL (ref <150)

## 2015-03-08 LAB — RAPID URINE DRUG SCREEN, HOSP PERFORMED
AMPHETAMINES: NOT DETECTED
BENZODIAZEPINES: POSITIVE — AB
Barbiturates: NOT DETECTED
Cocaine: NOT DETECTED
Opiates: NOT DETECTED
Tetrahydrocannabinol: NOT DETECTED

## 2015-03-08 LAB — MRSA PCR SCREENING: MRSA by PCR: NEGATIVE

## 2015-03-08 MED ORDER — DEXTROSE-NACL 5-0.45 % IV SOLN: INTRAVENOUS | Status: DC

## 2015-03-08 MED ORDER — FENTANYL CITRATE (PF) 100 MCG/2ML IJ SOLN
INTRAMUSCULAR | Status: AC
  Administered 2015-03-08: 100 ug
  Filled 2015-03-08: qty 4

## 2015-03-08 MED ORDER — SODIUM CHLORIDE 0.9 % IV SOLN
INTRAVENOUS | Status: DC
  Administered 2015-03-08: 7.2 [IU]/h via INTRAVENOUS
  Administered 2015-03-08: 6.3 [IU]/h via INTRAVENOUS
  Filled 2015-03-08: qty 2.5

## 2015-03-08 MED ORDER — PROPOFOL 1000 MG/100ML IV EMUL
0.0000 ug/kg/min | INTRAVENOUS | Status: DC
  Administered 2015-03-08 (×2): 40 ug/kg/min via INTRAVENOUS
  Administered 2015-03-08: 8 ug/kg/min via INTRAVENOUS
  Administered 2015-03-09: 20 ug/kg/min via INTRAVENOUS
  Filled 2015-03-08 (×2): qty 100

## 2015-03-08 MED ORDER — LORAZEPAM 2 MG/ML IJ SOLN
INTRAMUSCULAR | Status: AC
  Administered 2015-03-09: 4 mg via INTRAVENOUS
  Filled 2015-03-08: qty 1

## 2015-03-08 MED ORDER — LIP MEDEX EX OINT
TOPICAL_OINTMENT | CUTANEOUS | Status: DC | PRN
Start: 2015-03-08 — End: 2015-03-08
  Administered 2015-03-08: 03:00:00 via TOPICAL
  Filled 2015-03-08: qty 7

## 2015-03-08 MED ORDER — INSULIN ASPART 100 UNIT/ML ~~LOC~~ SOLN
0.0000 [IU] | SUBCUTANEOUS | Status: DC
  Administered 2015-03-08 – 2015-03-09 (×3): 4 [IU] via SUBCUTANEOUS

## 2015-03-08 MED ORDER — SODIUM CHLORIDE 0.9 % IV SOLN
INTRAVENOUS | Status: DC | PRN
  Administered 2015-03-08: 4.6 [IU]/h via INTRAVENOUS
  Filled 2015-03-08: qty 2.5

## 2015-03-08 MED ORDER — INSULIN GLARGINE 100 UNIT/ML ~~LOC~~ SOLN
25.0000 [IU] | Freq: Every day | SUBCUTANEOUS | Status: DC
  Administered 2015-03-08: 25 [IU] via SUBCUTANEOUS
  Filled 2015-03-08 (×2): qty 0.25

## 2015-03-08 MED ORDER — MIDAZOLAM HCL 2 MG/2ML IJ SOLN
2.0000 mg | Freq: Once | INTRAMUSCULAR | Status: AC
  Administered 2015-03-08: 2 mg via INTRAVENOUS

## 2015-03-08 MED ORDER — SODIUM CHLORIDE 0.9 % IV BOLUS (SEPSIS)
2000.0000 mL | Freq: Once | INTRAVENOUS | Status: AC
  Administered 2015-03-08: 2000 mL via INTRAVENOUS

## 2015-03-08 MED ORDER — HEPARIN SODIUM (PORCINE) 5000 UNIT/ML IJ SOLN: 5000.0000 [IU] | Freq: Three times a day (TID) | INTRAMUSCULAR | Status: DC

## 2015-03-08 MED ORDER — PIPERACILLIN-TAZOBACTAM 3.375 G IVPB
3.3750 g | Freq: Three times a day (TID) | INTRAVENOUS | Status: DC
  Administered 2015-03-08 – 2015-03-09 (×3): 3.375 g via INTRAVENOUS
  Filled 2015-03-08 (×5): qty 50

## 2015-03-08 MED ORDER — LIDOCAINE HCL (CARDIAC) 20 MG/ML IV SOLN
INTRAVENOUS | Status: AC
  Filled 2015-03-08: qty 5

## 2015-03-08 MED ORDER — PIPERACILLIN-TAZOBACTAM 3.375 G IVPB 30 MIN
3.3750 g | Freq: Once | INTRAVENOUS | Status: AC
  Administered 2015-03-08: 3.375 g via INTRAVENOUS
  Filled 2015-03-08: qty 50

## 2015-03-08 MED ORDER — ZIPRASIDONE HCL 80 MG PO CAPS
160.0000 mg | ORAL_CAPSULE | Freq: Every day | ORAL | Status: DC
  Filled 2015-03-08: qty 2

## 2015-03-08 MED ORDER — ANTISEPTIC ORAL RINSE SOLUTION (CORINZ)
7.0000 mL | Freq: Four times a day (QID) | OROMUCOSAL | Status: DC
  Administered 2015-03-08 – 2015-03-09 (×4): 7 mL via OROMUCOSAL

## 2015-03-08 MED ORDER — PROPOFOL 1000 MG/100ML IV EMUL
INTRAVENOUS | Status: AC
  Administered 2015-03-08: 8 ug/kg/min via INTRAVENOUS
  Filled 2015-03-08: qty 100

## 2015-03-08 MED ORDER — FENTANYL BOLUS VIA INFUSION
25.0000 ug | INTRAVENOUS | Status: DC | PRN
  Administered 2015-03-08: 25 ug via INTRAVENOUS
  Filled 2015-03-08: qty 25

## 2015-03-08 MED ORDER — SUCCINYLCHOLINE CHLORIDE 20 MG/ML IJ SOLN
INTRAMUSCULAR | Status: AC
  Filled 2015-03-08: qty 1

## 2015-03-08 MED ORDER — DEXTROSE 50 % IV SOLN: 25.0000 mL | INTRAVENOUS | Status: DC | PRN

## 2015-03-08 MED ORDER — MORPHINE SULFATE 4 MG/ML IJ SOLN
4.0000 mg | Freq: Once | INTRAMUSCULAR | Status: AC
  Administered 2015-03-08: 4 mg via INTRAVENOUS
  Filled 2015-03-08: qty 1

## 2015-03-08 MED ORDER — ROCURONIUM BROMIDE 50 MG/5ML IV SOLN
INTRAVENOUS | Status: AC
  Filled 2015-03-08: qty 2

## 2015-03-08 MED ORDER — FENTANYL CITRATE (PF) 100 MCG/2ML IJ SOLN
INTRAMUSCULAR | Status: AC
  Administered 2015-03-08: 200 ug via INTRAVENOUS
  Filled 2015-03-08: qty 2

## 2015-03-08 MED ORDER — LORAZEPAM 2 MG/ML IJ SOLN
1.0000 mg | Freq: Once | INTRAMUSCULAR | Status: AC
  Administered 2015-03-08: 1 mg via INTRAVENOUS

## 2015-03-08 MED ORDER — LAMOTRIGINE 200 MG PO TABS
200.0000 mg | ORAL_TABLET | Freq: Every day | ORAL | Status: DC
  Filled 2015-03-08: qty 1

## 2015-03-08 MED ORDER — CHLORHEXIDINE GLUCONATE 0.12 % MT SOLN
15.0000 mL | Freq: Two times a day (BID) | OROMUCOSAL | Status: DC
  Administered 2015-03-08 – 2015-03-09 (×2): 15 mL via OROMUCOSAL
  Filled 2015-03-08: qty 15

## 2015-03-08 MED ORDER — VANCOMYCIN HCL IN DEXTROSE 750-5 MG/150ML-% IV SOLN
750.0000 mg | INTRAVENOUS | Status: DC
  Administered 2015-03-08 – 2015-03-09 (×2): 750 mg via INTRAVENOUS
  Filled 2015-03-08 (×2): qty 150

## 2015-03-08 MED ORDER — ENOXAPARIN SODIUM 40 MG/0.4ML ~~LOC~~ SOLN
40.0000 mg | SUBCUTANEOUS | Status: DC
  Administered 2015-03-08: 40 mg via SUBCUTANEOUS
  Filled 2015-03-08 (×2): qty 0.4

## 2015-03-08 MED ORDER — PANTOPRAZOLE SODIUM 40 MG IV SOLR
40.0000 mg | Freq: Every day | INTRAVENOUS | Status: DC
  Administered 2015-03-08: 40 mg via INTRAVENOUS
  Filled 2015-03-08 (×2): qty 40

## 2015-03-08 MED ORDER — DEXTROSE-NACL 5-0.45 % IV SOLN
INTRAVENOUS | Status: DC
  Administered 2015-03-08 – 2015-03-09 (×3): via INTRAVENOUS

## 2015-03-08 MED ORDER — SODIUM CHLORIDE 0.9 % IJ SOLN
3.0000 mL | Freq: Two times a day (BID) | INTRAMUSCULAR | Status: DC
  Administered 2015-03-08 – 2015-03-09 (×3): 3 mL via INTRAVENOUS

## 2015-03-08 MED ORDER — INSULIN ASPART 100 UNIT/ML ~~LOC~~ SOLN: 0.0000 [IU] | SUBCUTANEOUS | Status: DC

## 2015-03-08 MED ORDER — SODIUM BICARBONATE 8.4 % IV SOLN
50.0000 meq | Freq: Once | INTRAVENOUS | Status: AC
  Administered 2015-03-08: 50 meq via INTRAVENOUS
  Filled 2015-03-08: qty 50

## 2015-03-08 MED ORDER — SODIUM CHLORIDE 0.45 % IV SOLN: INTRAVENOUS | Status: DC

## 2015-03-08 MED ORDER — SODIUM CHLORIDE 0.9 % IV SOLN
INTRAVENOUS | Status: DC
Start: 2015-03-08 — End: 2015-03-08

## 2015-03-08 MED ORDER — MIDAZOLAM HCL 2 MG/2ML IJ SOLN
INTRAMUSCULAR | Status: AC
  Administered 2015-03-08: 2 mg
  Filled 2015-03-08: qty 4

## 2015-03-08 MED ORDER — SODIUM CHLORIDE 0.9 % IV SOLN
25.0000 ug/h | INTRAVENOUS | Status: DC
  Administered 2015-03-08: 150 ug/h via INTRAVENOUS
  Administered 2015-03-08: 200 ug/h via INTRAVENOUS
  Filled 2015-03-08 (×3): qty 50

## 2015-03-08 MED ORDER — FENTANYL CITRATE (PF) 100 MCG/2ML IJ SOLN
200.0000 ug | Freq: Once | INTRAMUSCULAR | Status: AC
  Administered 2015-03-08: 200 ug via INTRAVENOUS

## 2015-03-08 MED ORDER — INSULIN REGULAR BOLUS VIA INFUSION
0.0000 [IU] | Freq: Three times a day (TID) | INTRAVENOUS | Status: DC | PRN
  Filled 2015-03-08: qty 10

## 2015-03-08 MED ORDER — ETOMIDATE 2 MG/ML IV SOLN
INTRAVENOUS | Status: AC
  Administered 2015-03-08: 20 mg
  Filled 2015-03-08: qty 20

## 2015-03-08 MED ORDER — SODIUM CHLORIDE 0.45 % IV SOLN
INTRAVENOUS | Status: DC
  Administered 2015-03-08 (×2): via INTRAVENOUS
  Filled 2015-03-08 (×11): qty 1000

## 2015-03-08 MED ORDER — INSULIN REGULAR BOLUS VIA INFUSION
0.0000 [IU] | Freq: Three times a day (TID) | INTRAVENOUS | Status: DC
  Filled 2015-03-08: qty 10

## 2015-03-08 NOTE — Progress Notes (Signed)
   03/08/15 1500  Clinical Encounter Type  Visited With Family  Visit Type Initial;Spiritual support;Social support;Patient actively dying  Referral From Palliative care team  Spiritual Encounters  Spiritual Needs Emotional  Stress Factors  Patient Stress Factors None identified  Family Stress Factors Loss   Chaplain was referred to patient via spiritual care consult. Patient's nurse introduced me to the patient's family in the waiting area. Patient's family had just met with palliative care. Chaplain discussed this with the patient's family and made it known that he is available for additional support during this time. Patient's father seems to be very accepting of the situation and articulated that the patient has been struggling with cancer for a while. Patient's family were particularly appreciative of palliative care's ability to make the patient's medical condition and plan of care understandable to the entire family. Patient's family says they have a minister who will provide them with support tomorrow. Chaplain will let the rest of spiritual care team know that additional support may be needed at a later time.  Hoby Kawai, Claudius Sis, Chaplain  3:16 PM

## 2015-03-08 NOTE — ED Notes (Signed)
Pt placed on venturi mask at 50% for oxygen saturations below 90%

## 2015-03-08 NOTE — Procedures (Signed)
Arterial Catheter Insertion Procedure Note Monica [name] [id number] Jul 22, 1961  Procedure: Insertion of Arterial Catheter  Indications: Blood pressure monitoring  Procedure Details Consent: Risks of procedure as well as the alternatives and risks of each were explained to the (patient/caregiver).  Consent for procedure obtained. Time Out: Verified patient identification, verified procedure, site/side was marked, verified correct patient position, special equipment/implants available, medications/allergies/relevent history reviewed, required imaging and test results available.  Performed  Maximum sterile technique was used including antiseptics, cap, gloves, gown, hand hygiene, mask and sheet. Skin prep: Chlorhexidine; local anesthetic administered 20 gauge catheter was inserted into right radial artery using the Seldinger technique.  Evaluation Blood flow good; BP tracing good. Complications: No apparent complications.  Procedure performed under direct supervision of Dr. Halford Chessman.  Monica Gens, NP-C [facility] Pgr: [phone] or if no answer [phone] 03/08/2015, 10:46 AM

## 2015-03-08 NOTE — ED Notes (Signed)
Pt's venturi mask changed to 55% for oxygen saturations below 90%

## 2015-03-08 NOTE — Consult Note (Signed)
PULMONARY / CRITICAL CARE MEDICINE   Name: Monica [name] MRN: [id number] DOB: [birth year]    ADMISSION DATE:  03/10/2015 CONSULTATION DATE:  03/08/2015  REFERRING MD :  FPTS  CHIEF COMPLAINT:  Short of breath  INITIAL PRESENTATION:  54 yo female with stage IV colon cancer presented with DKA.  Transferred to ICU with hypoxic respiratory failure with concern for aspiration PNA.  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  STUDIES:   SIGNIFICANT EVENTS: 8/03 Admit  8/04 VDRF, palliative care consulted, limited resuscitation >> no CPR, no defibrillation   HISTORY OF PRESENT ILLNESS:   54 yo female admitted with altered mental status, DKA.  Transferred to [facility].  Found to have hypoxia.  Concern for aspiration.  D/w pt's father >> okay with short term intubation.  She had recent evaluation with oncology.  Father told prognosis not good for colon cancer, and therapy was only to help keep her comfortable.  She has hx of IVDA, and on chronic methadone.  PAST MEDICAL HISTORY :   has a past medical history of Depression; Complication of anesthesia; Anxiety; IBS (irritable bowel syndrome); PONV (postoperative nausea and vomiting); Dysmenorrhea (09/05/2013); Leukopenia due to antineoplastic chemotherapy (09/28/2013); Thrombocytopenia due to drugs (09/28/2013); Neuropathy, diabetic; Bipolar disorder; Chronic pain disorder; Hepatitis C; Colon cancer (DX 07/22/2013); Metastatic adenocarcinoma to liver; Hydronephrosis, left; Hypertension; History of drug overdose; History of suicide attempt; Insulin dependent type 2 diabetes mellitus; Short-term memory loss; and Cancer associated pain (02/28/2015).  has past surgical history that includes Cesarean section (1991  &  05-15-2000); Colonoscopy (N/A, 07/19/2013); Partial colectomy (Right, 07/20/2013); Cholecystectomy (N/A, 07/20/2013); Portacath placement (Left, 08/12/2013); REMOVAL FATTY TUMOR  LEFT INTERIOR LEFT KNEE (07-13-2013); and Cystoscopy w/ ureteral stent placement (Left, 02/12/2015). Prior to Admission medications   Medication Sig Start Date End Date Taking? Authorizing Provider  atorvastatin (LIPITOR) 40 MG tablet Take 1 tablet (40 mg total) by mouth daily. 08/27/14   Elberta Leatherwood, MD  clonazePAM (KLONOPIN) 1 MG tablet Take 1 mg by mouth 2 (two) times daily.     Historical Provider, MD  diazepam (VALIUM) 10 MG tablet Take 10 mg by mouth every 8 (eight) hours as needed for anxiety.    Historical Provider, MD  furosemide (LASIX) 40 MG tablet Take 1 tablet (40 mg total) by mouth 2 (two) times daily. 09/28/12   Angelica Ran, MD  gabapentin (NEURONTIN) 800 MG tablet Take 1 tablet (800 mg total) by mouth 3 (three) times daily. Patient taking differently: Take 800 mg by mouth 2 (two) times daily.  12/26/14   Elberta Leatherwood, MD  HYDROmorphone (DILAUDID) 8 MG tablet Take 1 tablet (8 mg total) by mouth every 6 (six) hours as needed for severe pain. 02/28/15   Heath Lark, MD  hydrOXYzine (ATARAX/VISTARIL) 50 MG tablet Take 50 mg by mouth 2 (two) times daily.     Historical Provider, MD  insulin lispro (HUMALOG) 100 UNIT/ML injection Inject 1-10 Units into the skin 3 (three) times daily before meals.    Historical Provider, MD  lamoTRIgine (LAMICTAL) 100 MG tablet Take 200 mg by mouth every morning.     Historical Provider, MD  LANTUS 100 UNIT/ML injection INJECT 27 UNITS into SKIN EVERY DAY Patient taking differently: INJECT 27 UNITS into SKIN EVERY DAY---   does in pm 11/09/14   Elberta Leatherwood, MD  lidocaine-prilocaine (EMLA) cream Apply 1 application topically as needed (for pain). Apply to  port a cath site one hour prior to needle stick. Patient not taking: Reported on 02/28/2015 12/18/14   Heath Lark, MD  methadone (DOLOPHINE) 10 MG tablet Take 1 tablet (10 mg total) by mouth every 8 (eight) hours. 02/28/15   Heath Lark, MD  omeprazole (PRILOSEC) 20 MG capsule Take 20 mg by mouth every  morning.    Historical Provider, MD  polyethylene glycol powder (GLYCOLAX/MIRALAX) powder Take 17 g by mouth 2 (two) times daily as needed. Patient taking differently: Take 17 g by mouth every morning.  04/05/14   Elberta Leatherwood, MD  senna-docusate (SENOKOT-S) 8.6-50 MG per tablet Take 1 tablet by mouth 2 (two) times daily. Patient not taking: Reported on 02/28/2015 04/20/14   Heath Lark, MD  ziprasidone (GEODON) 80 MG capsule Take 160 mg by mouth at bedtime.     Historical Provider, MD   Allergies  Allergen Reactions  . Naproxen Swelling    itching    FAMILY HISTORY:  has no family status information on file.  SOCIAL HISTORY:  reports that she has been smoking Cigarettes.  She has a 10 pack-year smoking history. She has never used smokeless tobacco. She reports that she uses illicit drugs ("Crack" cocaine, Heroin, and Marijuana). She reports that she does not drink alcohol.  REVIEW OF SYSTEMS:   Unable to obtain.  SUBJECTIVE:   VITAL SIGNS: Temp:  [98.7 F (37.1 C)] 98.7 F (37.1 C) (08/04 0100) Pulse Rate:  [101-124] 101 (08/04 0715) Resp:  [26-47] 47 (08/04 0715) BP: (142-172)/(78-104) 148/94 mmHg (08/04 0715) SpO2:  [80 %-99 %] 95 % (08/04 0715) Weight:  [127 lb 10.3 oz (57.9 kg)] 127 lb 10.3 oz (57.9 kg) (08/04 0650) HEMODYNAMICS:   VENTILATOR SETTINGS:   INTAKE / OUTPUT:  Intake/Output Summary (Last 24 hours) at 03/08/15 0848 Last data filed at 03/08/15 0847  Gross per 24 hour  Intake      0 ml  Output    275 ml  Net   -275 ml    PHYSICAL EXAMINATION: General: ill appearing Neuro:  Confused, moves extremities HEENT:  Dry mucosa Cardiovascular:  tachycardic Lungs:  B/l crackles, accessory muscles Abdomen:  Soft, mild epigastric tenderness Musculoskeletal:  No edema Skin:  No rashes  LABS:  CBC  Recent Labs Lab 03/08/15 0020  WBC 19.5*  HGB 11.5*  HCT 35.7*  PLT 410*   Coag's No results for input(s): APTT, INR in the last 168 hours.    BMET  Recent Labs Lab 03/08/15 0020 03/08/15 0356  NA 136 140  K 4.5 3.4*  CL 98* 106  CO2 6* 9*  BUN 36* 39*  CREATININE 1.62* 1.59*  GLUCOSE 695* 503*   Electrolytes  Recent Labs Lab 03/08/15 0020 03/08/15 0356  CALCIUM 9.2 8.9   Sepsis Markers  Recent Labs Lab 03/08/15 0051  LATICACIDVEN 1.91   ABG No results for input(s): PHART, PCO2ART, PO2ART in the last 168 hours.   Liver Enzymes  Recent Labs Lab 03/08/15 0020  AST 22  ALT 22  ALKPHOS 206*  BILITOT 2.2*  ALBUMIN 3.1*   Cardiac Enzymes No results for input(s): TROPONINI, PROBNP in the last 168 hours.   Glucose  Recent Labs Lab 03/08/15 0117 03/08/15 0148 03/08/15 0301 03/08/15 0356 03/08/15 0510 03/08/15 0800  GLUCAP 589* 517* 513* 442* 347* 239*    Imaging Dg Chest Port 1 View  03/08/2015   CLINICAL DATA:  Crackles at the left lung field. Hyperglycemia. Current history of colon cancer. Initial encounter.  EXAM: PORTABLE CHEST - 1 VIEW  COMPARISON:  Chest radiograph performed 01/11/2014, and CT of the chest performed 01/26/2015  FINDINGS: There has been rapid interval progression of diffuse metastatic disease to both lungs, with extensive nodules and masses noted throughout both lungs. No definite superimposed pneumonia is seen, though it cannot be entirely excluded. No pleural effusion or pneumothorax identified.  The cardiomediastinal silhouette is normal in size. Prominence of the superior mediastinum likely reflects mediastinal lymphadenopathy as previously noted. This is more prominent than on the prior CT. A left-sided chest port is noted ending about the mid SVC. No acute osseous abnormalities are identified.  IMPRESSION: Rapid interval progression of diffuse metastatic disease to the lungs, with extensive nodules and masses noted throughout both lungs. No definite superimposed pneumonia seen, though it cannot be entirely excluded. Increased prominence of the superior mediastinum likely  reflects worsening mediastinal lymphadenopathy.   Electronically Signed   By: Garald Balding M.D.   On: 03/08/2015 00:56     ASSESSMENT / PLAN:  PULMONARY ETT 8/04 >> A: Acute respiratory failure with concern for aspiration. Tobacco abuse. P:   Proceed with intubation F/u CXR ABG  CARDIOVASCULAR CVL 8/04 >> Port >> A:  Hypovolemia, sinus tachycardia in setting of DKA. Hx of HTN. P:  Continue IV fluids Place CVL  RENAL A:   Hypokalemia. AKI. Metabolic acidosis. P:   F/u renal fx Replace electrolytes  GASTROINTESTINAL A:   Protein calorie malnutrition. P:   NPO Protonix for SUP Add tube feeds once DKA improved  HEMATOLOGIC A:   Stage IV colon cancer with diffuse mets to lung >> on palliative chemo as outpt. Leukocytosis. P:  F/u CBC lovenox for dvt prevention  INFECTIOUS A:   Possible aspiration PNA. P:   Day 1 vancomycin, zosyn  Sputum 8/04 >> Blood 8/04 >>  ENDOCRINE A:   DKA. P:   Insulin gtt until anion gap corrected  NEUROLOGIC A:   Acute metabolic encephalopathy. Hx of IVDA, depression, bipolar, chronic pain. P:   RASS goal: -1  Goals of Care >> Spoke with pt's father on phone.  He understands her prognosis is poor.  He is agreeable to short term support with mechanical ventilation to see if she can recover.  He would not want long term vent support, and would not want cardiac resuscitation in the event of cardiac arrest.  Limited resuscitation order placed.  Palliative care consulted, and will continue to work with pt's father about goals of care.  CC time 50 minutes.  [name], MD [facility] Pulmonary/Critical Care 03/08/2015, 9:05 AM Pager:  [phone] After 3pm call: [phone]

## 2015-03-08 NOTE — Clinical Documentation Improvement (Signed)
Would you please help clarify medical condition related to clinical findings?  Sepsis Severe Sepsis Severe sepsis with shock Other Condition  Cannot clinically Determine   Labs - Lactic acid = 1.9, Total bili = 2.2, WBC 19.5. Heart rate = 287 and metabolic enceph..  Organ dysfunction - resp. Failure with intubation, acute kidney failure, acidosis, and hypovolemia.  Thank You, Margretta Sidle ,RN Clinical Documentation Specialist:    [place] [phone] Cell - [phone]

## 2015-03-08 NOTE — ED Notes (Signed)
Pt's father stated the patient has recently switched from suboxin to dilaudid. He states she may have taken extra pain medicine. Pt's father is Riley Nearing [phone]

## 2015-03-08 NOTE — Care Management Note (Signed)
Case Management Note  Patient Details  Name: Monica [name] MRN: [id number] Date of Birth: 1961-01-02  Subjective/Objective:       Adm w dka, ins drip             Action/Plan: lives w fam, pcp dr Georges Lynch   Expected Discharge Date:                  Expected Discharge Plan:  [place]  In-House Referral:     Discharge planning Services     Post Acute Care Choice:    Choice offered to:     DME Arranged:    DME Agency:     HH Arranged:    [place] Agency:     Status of Service:     Medicare Important Message Given:    Date Medicare IM Given:    Medicare IM give by:    Date Additional Medicare IM Given:    Additional Medicare Important Message give by:     If discussed at [place] of Stay Meetings, dates discussed:    Additional Comments: ur review done  Lacretia Leigh, RN 03/08/2015, 10:33 AM

## 2015-03-08 NOTE — Progress Notes (Signed)
Nutrition Brief Note  Chart reviewed. Pt now transitioning to comfort care. Palliative care has met with pt family and plan is for one way extubation tomorrow AM.  No further nutrition interventions warranted at this time.  Please re-consult as needed.   Monica [name] A. Jimmye Norman, RD, LDN, CDE Pager: [phone] After hours Pager: [phone]

## 2015-03-08 NOTE — Progress Notes (Signed)
Pt arrived on unit with restraints.  Restraints immediately d/c'd on arrival.

## 2015-03-08 NOTE — Procedures (Signed)
Intubation Procedure Note Monica [name] [id number] 05-01-1961  Procedure: Intubation Indications: Respiratory insufficiency  Procedure Details Consent: Risks of procedure as well as the alternatives and risks of each were explained to the (patient/caregiver).  Consent for procedure obtained. Time Out: Verified patient identification, verified procedure, site/side was marked, verified correct patient position, special equipment/implants available, medications/allergies/relevent history reviewed, required imaging and test results available.  Performed  Maximum sterile technique was used including gloves, hand hygiene and mask.  MAC and 3  Used glidescope.  Evaluation Hemodynamic Status: BP stable throughout; O2 sats: transiently fell during during procedure Patient's Current Condition: stable Complications: No apparent complications Patient did tolerate procedure well. Chest X-ray ordered to verify placement.  CXR: pending.   Monica Mires, MD [facility] Pulmonary/Critical Care 03/08/2015, 9:24 AM Pager:  [phone] After 3pm call: [phone]

## 2015-03-08 NOTE — Procedures (Signed)
Central Venous Catheter Insertion Procedure Note Monica [name] [id number] Dec 22, 1960  Procedure: Insertion of Central Venous Catheter Indications: Assessment of intravascular volume, Drug and/or fluid administration and Frequent blood sampling  Procedure Details Consent: Risks of procedure as well as the alternatives and risks of each were explained to the (patient/caregiver).  Consent for procedure obtained.   Time Out: Verified patient identification, verified procedure, site/side was marked, verified correct patient position, special equipment/implants available, medications/allergies/relevent history reviewed, required imaging and test results available.  Performed  Maximum sterile technique was used including antiseptics, cap, gloves, gown, hand hygiene, mask and sheet.  Skin prep: Chlorhexidine; local anesthetic administered A antimicrobial bonded/coated triple lumen catheter was placed in the right internal jugular vein using the Seldinger technique.  Sutured at 16 cm.    Evaluation Blood flow good Complications: No apparent complications Patient did tolerate procedure well. Chest X-ray ordered to verify placement.  CXR: pending.  Procedure performed under direct supervision of Dr. Halford Chessman and with ultrasound guidance for real time vessel cannulation.      Monica Gens, NP-C [place] Pulmonary & Critical Care Pgr: [phone] or if no answer [phone] 03/08/2015, 10:45 AM

## 2015-03-08 NOTE — Consult Note (Signed)
Consultation Note Date: 03/08/2015   Patient Name: Monica [name]  DOB: [birth year]  MRN: [id number]  Age / Sex: 53 y.o., female   PCP: Elberta Leatherwood, MD Referring Physician: [name], MD  Reason for Consultation: Establishing goals of care, Psychosocial/spiritual support and Withdrawal of life-sustaining treatment  Palliative Care Assessment and Plan Summary of Established Goals of Care and Medical Treatment Preferences    Palliative Care Discussion Held Today:     This NP Wadie Lessen reviewed medical records, received report from team, assessed the patient and then meet  with her family to include her father Riley Nearing, daughter 56 yo Anderson Malta, and two "estaranged " daughters Raquel Sarna and Myriam Jacobson  to discuss diagnosis, prognosis, GOC, EOL wishes disposition and options.  A detailed discussion was had today regarding advanced directives.  Concepts specific to code status, artifical feeding and hydration, continued IV antibiotics and rehospitalization was had.  The difference between a aggressive medical intervention path  and a palliative comfort care path for this patient at this time was had.  Values and goals of care important to patient and family were attempted to be elicited.  Concept of Hospice and Palliative Care were discussed  Natural trajectory and expectations at EOL were discussed.  Questions and concerns addressed.  Family encouraged to call with questions or concerns.  PMT will continue to support holistically.   This is a sad, complicated situation.  Family relationships have be strained and affected by mental health illness and addiction thru the years.   Patient lost custody of her two oldest daughters many years ago and are now "estranged" from their mother.    19 yo daughter (per her grand-father) basically raised herself and was the caregiver of the patient most of her life 70 yo's grand-mother died in Dec 13, 2022.      Family is dealing with an EOL situation overlayed  with guilt, anger and overall sadness.    Primary Decision Maker: Father/Mr Long supported by all family members  No documented HPOA  Goals of Care/Code Status/Advance Care Planning:  Continue current treatment plan allowing for family to gather and process this imminent death.  Tomorrow morning family will gather for a one way extubation at 1000 am.  This NP will support family at that time.     Psycho-social/Spiritual:    Support System: family  Desire for further Chaplaincy support: yes  Prognosis: Hours - Days  Discharge Planning:  Likely a hospital death       Chief Complaint:  dyspnea  History of Present Illness:   54 yo female with stage IV colon cancer, CT scan positive for liver metastasis,  presented with DKA. Transferred to ICU with hypoxic respiratory failure with concern for aspiration PNA. 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  Admitted for stabilization.  Acute respiratory failure and emergently intubated.  All family express that this is not what this patient desired.  Family faces EOL decisions     Primary Diagnoses  Present on Admission:  . DKA (diabetic ketoacidoses) . CKD (chronic kidney disease), stage III . Bipolar disorder . Colon cancer . Diabetes mellitus type I  Palliative Review of Systems:    -unable to illict   I have reviewed the medical record, interviewed the patient and family, and examined the patient. The following aspects are pertinent.  Past Medical History  Diagnosis Date  . Depression   . Complication of anesthesia  Difficulty putting to sllep- done at [facility]  . Anxiety   . IBS (irritable bowel syndrome)   . PONV (postoperative nausea and vomiting)   . Dysmenorrhea 09/05/2013  . Leukopenia due to antineoplastic chemotherapy 09/28/2013  . Thrombocytopenia due to drugs 09/28/2013  . Neuropathy, diabetic   . Bipolar disorder     HX PSYCHOTIC  EPISODE  . Chronic pain disorder   . Hepatitis C     SECONDARY TO HX V DRUG USE  . Colon cancer DX 07/22/2013    W/ METS TO LIVER--  RIGHT SIDE ,  Stage IV (T4N2M1)  . Metastatic adenocarcinoma to liver     PRIMARY -- COLORECTAL   . Hydronephrosis, left   . Hypertension   . History of drug overdose     severel times  . History of suicide attempt   . Insulin dependent type 2 diabetes mellitus   . Short-term memory loss     due to chemo  . Cancer associated pain 02/28/2015   History   Social History  . Marital Status: Divorced    Spouse Name: N/A  . Number of Children: N/A  . Years of Education: N/A   Social History Main Topics  . Smoking status: Current Every Day Smoker -- 0.50 packs/day for 20 years    Types: Cigarettes  . Smokeless tobacco: Never Used     Comment: 1 pp3d  . Alcohol Use: No  . Drug Use: Yes    Special: "Crack" cocaine, Heroin, Marijuana     Comment: Pt states she has been clean since " last spring" *(2011/11/18)- hx heroin abuse (last cocaine Apr 16, 2016mother died)  . Sexual Activity: No   Other Topics Concern  . None   Social History Narrative   History reviewed. No pertinent family history. Scheduled Meds: . antiseptic oral rinse  7 mL Mouth Rinse QID  . chlorhexidine  15 mL Mouth Rinse BID  . enoxaparin (LOVENOX) injection  40 mg Subcutaneous Q24H  . insulin regular  0-10 Units Intravenous TID WC  . lidocaine (cardiac) 100 mg/32ml      . LORazepam      . pantoprazole (PROTONIX) IV  40 mg Intravenous QHS  . piperacillin-tazobactam (ZOSYN)  IV  3.375 g Intravenous Q8H  . rocuronium      . sodium chloride  3 mL Intravenous Q12H  . succinylcholine      . vancomycin  750 mg Intravenous Q24H   Continuous Infusions: . dextrose 5 % and 0.45% NaCl 125 mL/hr at 03/08/15 1304  . fentaNYL infusion INTRAVENOUS 200 mcg/hr (03/08/15 1142)  . insulin (NOVOLIN-R) infusion 7.2 Units/hr (03/08/15 0806)  . propofol (DIPRIVAN) infusion 40 mcg/kg/min (03/08/15  1142)  . sodium chloride 0.45 % 1,000 mL infusion 200 mL/hr at 03/08/15 0805   PRN Meds:.dextrose, fentaNYL, insulin regular Medications Prior to Admission:  Prior to Admission medications   Medication Sig Start Date End Date Taking? Authorizing Provider  atorvastatin (LIPITOR) 40 MG tablet Take 1 tablet (40 mg total) by mouth daily. 08/27/14   Elberta Leatherwood, MD  clonazePAM (KLONOPIN) 1 MG tablet Take 1 mg by mouth 2 (two) times daily.     Historical Provider, MD  diazepam (VALIUM) 10 MG tablet Take 10 mg by mouth every 8 (eight) hours as needed for anxiety.    Historical Provider, MD  furosemide (LASIX) 40 MG tablet Take 1 tablet (40 mg total) by mouth 2 (two) times daily. 09/28/12   Angelica Ran, MD  gabapentin (  NEURONTIN) 800 MG tablet Take 1 tablet (800 mg total) by mouth 3 (three) times daily. Patient taking differently: Take 800 mg by mouth 2 (two) times daily.  12/26/14   Elberta Leatherwood, MD  HYDROmorphone (DILAUDID) 8 MG tablet Take 1 tablet (8 mg total) by mouth every 6 (six) hours as needed for severe pain. 02/28/15   Heath Lark, MD  hydrOXYzine (ATARAX/VISTARIL) 50 MG tablet Take 50 mg by mouth 2 (two) times daily.     Historical Provider, MD  insulin lispro (HUMALOG) 100 UNIT/ML injection Inject 1-10 Units into the skin 3 (three) times daily before meals.    Historical Provider, MD  lamoTRIgine (LAMICTAL) 100 MG tablet Take 200 mg by mouth every morning.     Historical Provider, MD  LANTUS 100 UNIT/ML injection INJECT 27 UNITS into SKIN EVERY DAY Patient taking differently: INJECT 27 UNITS into SKIN EVERY DAY---   does in pm 11/09/14   Elberta Leatherwood, MD  lidocaine-prilocaine (EMLA) cream Apply 1 application topically as needed (for pain). Apply to port a cath site one hour prior to needle stick. Patient not taking: Reported on 02/28/2015 12/18/14   Heath Lark, MD  methadone (DOLOPHINE) 10 MG tablet Take 1 tablet (10 mg total) by mouth every 8 (eight) hours. 02/28/15   Heath Lark, MD    omeprazole (PRILOSEC) 20 MG capsule Take 20 mg by mouth every morning.    Historical Provider, MD  polyethylene glycol powder (GLYCOLAX/MIRALAX) powder Take 17 g by mouth 2 (two) times daily as needed. Patient taking differently: Take 17 g by mouth every morning.  04/05/14   Elberta Leatherwood, MD  senna-docusate (SENOKOT-S) 8.6-50 MG per tablet Take 1 tablet by mouth 2 (two) times daily. Patient not taking: Reported on 02/28/2015 04/20/14   Heath Lark, MD  ziprasidone (GEODON) 80 MG capsule Take 160 mg by mouth at bedtime.     Historical Provider, MD   Allergies  Allergen Reactions  . Naproxen Swelling    itching   CBC:    Component Value Date/Time   WBC 19.5* 03/08/2015 0020   WBC 5.6 02/23/2015 1003   HGB 11.5* 03/08/2015 0020   HGB 11.5* 02/23/2015 1003   HCT 35.7* 03/08/2015 0020   HCT 35.3 02/23/2015 1003   PLT 410* 03/08/2015 0020   PLT 251 02/23/2015 1003   MCV 88.6 03/08/2015 0020   MCV 87.4 02/23/2015 1003   NEUTROABS 4.0 02/23/2015 1003   NEUTROABS 3.3 05/14/2014 2233   LYMPHSABS 0.8* 02/23/2015 1003   LYMPHSABS 1.3 05/14/2014 2233   MONOABS 0.7 02/23/2015 1003   MONOABS 0.4 05/14/2014 2233   EOSABS 0.1 02/23/2015 1003   EOSABS 0.2 05/14/2014 2233   BASOSABS 0.0 02/23/2015 1003   BASOSABS 0.0 05/14/2014 2233   Comprehensive Metabolic Panel:    Component Value Date/Time   NA 142 03/08/2015 0835   NA 138 02/23/2015 1003   K 3.5 03/08/2015 0835   K 4.5 02/23/2015 1003   CL 112* 03/08/2015 0835   CO2 17* 03/08/2015 0835   CO2 28 02/23/2015 1003   BUN 34* 03/08/2015 0835   BUN 12.0 02/23/2015 1003   CREATININE 1.46* 03/08/2015 0835   CREATININE 1.2* 02/23/2015 1003   CREATININE 1.38* 05/26/2013 1703   GLUCOSE 245* 03/08/2015 0835   GLUCOSE 147* 02/23/2015 1003   CALCIUM 9.0 03/08/2015 0835   CALCIUM 9.5 02/23/2015 1003   AST 22 03/08/2015 0020   AST 23 02/23/2015 1003   ALT 22 03/08/2015 0020  ALT 12 02/23/2015 1003   ALKPHOS 206* 03/08/2015 0020   ALKPHOS  191* 02/23/2015 1003   BILITOT 2.2* 03/08/2015 0020   BILITOT 0.33 02/23/2015 1003   PROT 7.8 03/08/2015 0020   PROT 7.7 02/23/2015 1003   ALBUMIN 3.1* 03/08/2015 0020   ALBUMIN 2.6* 02/23/2015 1003    Physical Exam:  Vital Signs: BP 109/69 mmHg  Pulse 96  Temp(Src) 98.3 F (36.8 C) (Oral)  Resp 33  Ht 5\' 5"  (1.651 m)  Wt 57.9 kg (127 lb 10.3 oz)  BMI 21.24 kg/m2  SpO2 95%  LMP 12/05/2014 SpO2: SpO2: 95 % O2 Device: O2 Device: Ventilator O2 Flow Rate: O2 Flow Rate (L/min): 4 L/min Intake/output summary:  Intake/Output Summary (Last 24 hours) at 03/08/15 1428 Last data filed at 03/08/15 0847  Gross per 24 hour  Intake      0 ml  Output    275 ml  Net   -275 ml   LBM:   Baseline Weight: Weight: 57.9 kg (127 lb 10.3 oz) Most recent weight: Weight: 57.9 kg (127 lb 10.3 oz)  Exam Findings:   General: intubated and sedated HEENT: noted temporal muscle wasting CVS: tachycardic                      Additional Data Reviewed: Recent Labs     03/08/15  0020  03/08/15  0356  03/08/15  0835  WBC  19.5*   --    --   HGB  11.5*   --    --   PLT  410*   --    --   NA  136  140  142  BUN  36*  39*  34*  CREATININE  1.62*  1.59*  1.46*     Time In: 1330 Time Out: 1500 Time Total: 90 minutes  Greater than 50%  of this time was spent counseling and coordinating care related to the above assessment and plan.  Discussed with  Dr Milinda Hirschfeld CCM  Signed by: Wadie Lessen, NP  Knox Royalty, NP  03/08/2015, 2:28 PM  Please contact Palliative Medicine Team phone at [phone] for questions and concerns.   See AMION for contact information

## 2015-03-08 NOTE — H&P (Signed)
[facility] Admission History and Physical Service Pager: [phone]  Patient name: Monica [name] Medical record number: [id number] Date of birth: 01-07-1961 Age: 54 y.o. Gender: female  Primary Care Provider: Georges Lynch, MD Consultants: PCCM Code Status: Full (for now until can verify status with father)  Chief Complaint: DKA  Assessment and Plan: Monica [name] is a 54 y.o. female presenting with DKA . PMH is significant for T1DM, end stage colon cancer s/p right hemicolectomy, CKD3, bipolar disorder, and substance abuse.  DKA: Patient with known h/o T1DM.  Per ED report not taking home insulin over last couple of days.  BG on arrival to [facility] ED ~700.  BG on arrival 347.  Home meds: Lantus 27u qd, humalog per sliding scale. Corrected Na 147. Anion gap 32>25.  K 4.5>3.4. Cr: 1.62>1.59. Lactic Acid: 1.91 Bicarb at [facility] 6 and 9 on arrival s/p 1 amp of bicarb at [place] to SDU under Dr Ardelia Mems -VS per floor protocol -Glucomander protocol -NS @200cc /h then switch to D5 1/2NS when BG <250 -NPO while on insulin gtt -Continue q4 BMET monitoring -Bridge with sub-q insulin once anion gap closed.  Will plan to bridge gtt and sub-q over 2 hours. -A1c  -Replete electrolytes as needed.  Hypoxia/ Respiratory distress: desaturated <90% and placed on venti-mask at 55% at [facility].  Saturating @ 91%. CXR with rapid interval progression of diffuse metastatic disease.  Cannot r/o PNA.  Afebrile but tachycardic.  WBC 19.5. DDx metastasis vs infection vs ARDS -Obtain blood cultures -Monitor O2 requirement -Monitor for fevers -Will broadly cover with Vanc and Zosyn in light of possible infection/ uncertain CXR -now s/p intubation   Colon cancer: recent CT with significant disease progression.  Currently, receiving chemo under [facility].  He is providing Methadone 10mg  TID & Dilaudid 8 q6  -No apparent discussion in chart re: [place] -c/s Palliative care.  Touched base with  provider this am.  To see her today. -Needs to figure out code status/ goals of care -Moderately altered on exam.  Given diffuse mets elsewhere in body, would consider possible mets to brain contributing to AMS.  Though unsure benefit of imaging at this point. -c/s CSW, concern for patient attempting to end life by not taking insulin -Would consider FYI to patient's oncologist  HTN: stable on admission -hold home BP medications for now -restart when able  CKD 3/hydronephrosis:  Cr 1.62>1.59 (baseline Cr 1.2) -IVF hydration as above -strict I/O -left hydronephrosis s/p stent placement 02/12/15  H/o Hepatitis C  Bipolar disorder. On lamictal and geodon at home -resume home meds as able  Substance abuse/polypharmacy: pain medication as above -c/s social work -previously on [employer], recently [employer] what she believed to be oxycodone on the street -hold pain meds until clinically stable -consider narcan administration, however patient has good respiratory effort  FEN/GI: 1/2 NS @ 200cc/h then will switch to D5 1/2NS once BG <250. NPO while on gtt. Prophylaxis: sub-q heparin  Disposition: Admit to SDU for glucomander protocol  History of Present Illness: Monica [name] is a 54 y.o. female presenting with DKA in the setting of significant progression of colon cancer. She was altered but able to confirm that she has not taken her insulin in the last four days.  This was also reported by ED provider.    Review Of Systems: Per HPI with the following additions: unable to perform given AMS.   Patient Active Problem List   Diagnosis Date Noted  . DKA (  diabetic ketoacidoses) 03/08/2015  . Confusion   . Metastatic cancer   . Cancer associated pain 02/28/2015  . Double vision 02/26/2015  . Bilateral hydronephrosis 01/29/2015  . Bereavement due to life event 12/11/2014  . Chronic pain disorder 09/08/2014  . Lung nodule seen on imaging study 07/17/2014  . Other constipation 04/20/2014   . Metastasis to liver 01/18/2014  . Hypoglycemia 01/11/2014  . Neuropathy due to secondary diabetes 10/12/2013  . Leukopenia due to antineoplastic chemotherapy 09/28/2013  . Thrombocytopenia due to drugs 09/28/2013  . Dysmenorrhea 09/05/2013  . Colon cancer 07/22/2013  . Colonic mass 07/16/2013  . Constipation 05/26/2013  . Severe diabetic hypoglycemia 12/17/2012  . Polypharmacy 12/17/2012  . 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 Bilateral knee pain 09/09/2012  . Opioid dependence 09/21/2011  . Benzodiazepine abuse 09/21/2011  . PERIMENOPAUSAL STATUS 10/11/2010  . HYPERLIPIDEMIA 08/20/2010  . LYMPHADENOPATHY 08/20/2010  . Overweight (BMI 25.0-29.9) 06/22/2009  . CKD (chronic kidney disease), stage III 03/30/2009  . Edema 08/15/2008  . Diabetes mellitus type I 04/07/2008  . 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 HEPATITIS C 04/04/2008  . ANEMIA, CHRONIC 04/04/2008  . Bipolar disorder 04/04/2008  . TOBACCO ABUSE 04/04/2008  . Polysubstance abuse 04/04/2008  . HYPERTENSION, BENIGN ESSENTIAL 04/04/2008   Past Medical History: Past Medical History  Diagnosis Date  . Depression   . Complication of anesthesia     Difficulty putting to sllep- done at [facility]  . Anxiety   . IBS (irritable bowel syndrome)   . PONV (postoperative nausea and vomiting)   . Dysmenorrhea 09/05/2013  . Leukopenia due to antineoplastic chemotherapy 09/28/2013  . Thrombocytopenia due to drugs 09/28/2013  . Neuropathy, diabetic   . Bipolar disorder     HX PSYCHOTIC EPISODE  . Chronic pain disorder   . Hepatitis C     SECONDARY TO HX V DRUG USE  . Colon cancer DX 07/22/2013    W/ METS TO LIVER--  RIGHT SIDE ,  Stage IV (T4N2M1)  . Metastatic adenocarcinoma to liver     PRIMARY -- COLORECTAL   . Hydronephrosis, left   . Hypertension   . History of drug overdose     severel times  . History of suicide attempt   . Insulin dependent type 2 diabetes mellitus   . Short-term memory loss     due to chemo  . Cancer associated  pain 02/28/2015   Past Surgical History: Past Surgical History  Procedure Laterality Date  . Cesarean section  1991  &  05-15-2000    Bilateral Tubal Ligation with last one  . Colonoscopy N/A 07/19/2013    Procedure: COLONOSCOPY;  Surgeon: Beryle Beams, MD;  Location: [place];  Service: Endoscopy;  Laterality: N/A;  . Partial colectomy Right 07/20/2013    Procedure: PARTIAL COLECTOMY;  Surgeon: Zenovia Jarred, MD;  Location: [place];  Service: General;  Laterality: Right;  . Cholecystectomy N/A 07/20/2013    Procedure: CHOLECYSTECTOMY;  Surgeon: Zenovia Jarred, MD;  Location: [place];  Service: General;  Laterality: N/A;  . Portacath placement Left 08/12/2013    Procedure: INSERTION PORT-A-CATH;  Surgeon: Zenovia Jarred, MD;  Location: [place];  Service: General;  Laterality: Left;  . Removal fatty tumor left interior left knee  07-13-2013  . Cystoscopy w/ ureteral stent placement Left 02/12/2015    Procedure: CYSTOSCOPY WITH LEFT RETROGRADE PYELOGRAM/URETERAL STENT PLACEMENT;  Surgeon: Rana Snare, MD;  Location[facility];  Service: Urology;  Laterality: Left;  30 MIN  [place] [id number]   Social History: History  Substance Use Topics  . Smoking status: Current Every Day Smoker -- 0.50 packs/day for 20 years    Types: Cigarettes  . Smokeless tobacco: Never Used     Comment: 1 pp3d  . Alcohol Use: No   Please also refer to relevant sections of EMR.  Family History: History reviewed. No pertinent family history. Allergies and Medications: Allergies  Allergen Reactions  . Naproxen Swelling    itching   Current Facility-Administered Medications on File Prior to Encounter  Medication Dose Route Frequency Provider Last Rate Last Dose  . sodium chloride 0.9 % injection 10 mL  10 mL Intravenous PRN Heath Lark, MD   10 mL at 09/25/14 4270   Current Outpatient Prescriptions on File Prior to Encounter  Medication Sig Dispense Refill  .  atorvastatin (LIPITOR) 40 MG tablet Take 1 tablet (40 mg total) by mouth daily. 30 tablet 5  . clonazePAM (KLONOPIN) 1 MG tablet Take 1 mg by mouth 2 (two) times daily.     . diazepam (VALIUM) 10 MG tablet Take 10 mg by mouth every 8 (eight) hours as needed for anxiety.    . furosemide (LASIX) 40 MG tablet Take 1 tablet (40 mg total) by mouth 2 (two) times daily. 60 tablet 5  . gabapentin (NEURONTIN) 800 MG tablet Take 1 tablet (800 mg total) by mouth 3 (three) times daily. (Patient taking differently: Take 800 mg by mouth 2 (two) times daily. ) 180 tablet 3  . HYDROmorphone (DILAUDID) 8 MG tablet Take 1 tablet (8 mg total) by mouth every 6 (six) hours as needed for severe pain. 40 tablet 0  . hydrOXYzine (ATARAX/VISTARIL) 50 MG tablet Take 50 mg by mouth 2 (two) times daily.     . insulin lispro (HUMALOG) 100 UNIT/ML injection Inject 1-10 Units into the skin 3 (three) times daily before meals.    . lamoTRIgine (LAMICTAL) 100 MG tablet Take 200 mg by mouth every morning.     Marland Kitchen LANTUS 100 UNIT/ML injection INJECT 27 UNITS into SKIN EVERY DAY (Patient taking differently: INJECT 27 UNITS into SKIN EVERY DAY---   does in pm) 10 mL 3  . lidocaine-prilocaine (EMLA) cream Apply 1 application topically as needed (for pain). Apply to port a cath site one hour prior to needle stick. (Patient not taking: Reported on 02/28/2015) 30 g 2  . methadone (DOLOPHINE) 10 MG tablet Take 1 tablet (10 mg total) by mouth every 8 (eight) hours. 30 tablet 0  . omeprazole (PRILOSEC) 20 MG capsule Take 20 mg by mouth every morning.    . polyethylene glycol powder (GLYCOLAX/MIRALAX) powder Take 17 g by mouth 2 (two) times daily as needed. (Patient taking differently: Take 17 g by mouth every morning. ) 3350 g 3  . senna-docusate (SENOKOT-S) 8.6-50 MG per tablet Take 1 tablet by mouth 2 (two) times daily. (Patient not taking: Reported on 02/28/2015) 90 tablet 3  . ziprasidone (GEODON) 80 MG capsule Take 160 mg by mouth at bedtime.        Objective: BP 148/94 mmHg  Pulse 101  Temp(Src) 98.7 F (37.1 C) (Rectal)  Resp 47  Ht 5\' 5"  (1.651 m)  Wt 127 lb 10.3 oz (57.9 kg)  BMI 21.24 kg/m2  SpO2 95%  LMP 12/05/2014 Exam: General: awake, sitting up in bed non re-breather in place, appears distressed Eyes: EOMI, nonicteric HENT: Dry mucous membranes.  Neck: Normal range of motion.  Cardiovascular: Tachycardic. No murmurs, rubs  or gallops. Respiratory: Tachypneic. Increased effort. Supraclavicular retractions. Breathing on non rebreather  Abdomen: Soft. Endorsed diffuse tenderness to palpation.  +BS, non distended  MSK: Moves all four limbs spontaneously. No edema, +1 posterior tib pulses b/l Skin: WWP.  No rashes appreciated. Neuro: Patient nods yes and no to questions. Does not answer consistently. Trying to remove non-rebreather mask with mitts.    Labs and Imaging: CBC BMET   Recent Labs Lab 03/08/15 0020  WBC 19.5*  HGB 11.5*  HCT 35.7*  PLT 410*    Recent Labs Lab 03/08/15 0356  NA 140  K 3.4*  CL 106  CO2 9*  BUN 39*  CREATININE 1.59*  GLUCOSE 503*  CALCIUM 8.9      Rogue Bussing, MD 03/08/2015, 8:59 AM PGY-1, [place] Intern pager: [phone], text pages welcome  I have separately seen and examined the patient. I have discussed the findings and exam with Dr Ola Spurr and agree with the above note.  My changes/additions are outlined in BLUE.   Nitara Szczerba M. Lajuana Ripple, DO PGY-2, [place]

## 2015-03-08 NOTE — Progress Notes (Signed)
ANTIBIOTIC CONSULT NOTE - INITIAL  Pharmacy Consult for Vancocin and Zosyn Indication: SIRS  Allergies  Allergen Reactions  . Naproxen Swelling    itching    Patient Measurements: Height: 5\' 5"  (165.1 cm) Weight: 127 lb 10.3 oz (57.9 kg) IBW/kg (Calculated) : 57  Vital Signs: Temp: 98.7 F (37.1 C) (08/04 0100) Temp Source: Rectal (08/04 0100) BP: 156/104 mmHg (08/04 0650) Pulse Rate: 102 (08/04 0650)  Labs:  Recent Labs  03/08/15 0020 03/08/15 0356  WBC 19.5*  --   HGB 11.5*  --   PLT 410*  --   CREATININE 1.62* 1.59*   Estimated Creatinine Clearance: 36.8 mL/min (by C-G formula based on Cr of 1.59).  Medical History: Past Medical History  Diagnosis Date  . Depression   . Complication of anesthesia     Difficulty putting to sllep- done at [facility]  . Anxiety   . IBS (irritable bowel syndrome)   . PONV (postoperative nausea and vomiting)   . Dysmenorrhea 09/05/2013  . Leukopenia due to antineoplastic chemotherapy 09/28/2013  . Thrombocytopenia due to drugs 09/28/2013  . Neuropathy, diabetic   . Bipolar disorder     HX PSYCHOTIC EPISODE  . Chronic pain disorder   . Hepatitis C     SECONDARY TO HX V DRUG USE  . Colon cancer DX 07/22/2013    W/ METS TO LIVER--  RIGHT SIDE ,  Stage IV (T4N2M1)  . Metastatic adenocarcinoma to liver     PRIMARY -- COLORECTAL   . Hydronephrosis, left   . Hypertension   . History of drug overdose     severel times  . History of suicide attempt   . Insulin dependent type 2 diabetes mellitus   . Short-term memory loss     due to chemo  . Cancer associated pain 02/28/2015    Medications:  Prescriptions prior to admission  Medication Sig Dispense Refill Last Dose  . atorvastatin (LIPITOR) 40 MG tablet Take 1 tablet (40 mg total) by mouth daily. 30 tablet 5 Taking  . clonazePAM (KLONOPIN) 1 MG tablet Take 1 mg by mouth 2 (two) times daily.    Taking  . diazepam (VALIUM) 10 MG tablet Take 10 mg by mouth every 8 (eight) hours as  needed for anxiety.   Taking  . furosemide (LASIX) 40 MG tablet Take 1 tablet (40 mg total) by mouth 2 (two) times daily. 60 tablet 5 Taking  . gabapentin (NEURONTIN) 800 MG tablet Take 1 tablet (800 mg total) by mouth 3 (three) times daily. (Patient taking differently: Take 800 mg by mouth 2 (two) times daily. ) 180 tablet 3 Taking  . HYDROmorphone (DILAUDID) 8 MG tablet Take 1 tablet (8 mg total) by mouth every 6 (six) hours as needed for severe pain. 40 tablet 0   . hydrOXYzine (ATARAX/VISTARIL) 50 MG tablet Take 50 mg by mouth 2 (two) times daily.    Taking  . insulin lispro (HUMALOG) 100 UNIT/ML injection Inject 1-10 Units into the skin 3 (three) times daily before meals.   Taking  . lamoTRIgine (LAMICTAL) 100 MG tablet Take 200 mg by mouth every morning.    Taking  . LANTUS 100 UNIT/ML injection INJECT 27 UNITS into SKIN EVERY DAY (Patient taking differently: INJECT 27 UNITS into SKIN EVERY DAY---   does in pm) 10 mL 3 03/04/2015  . lidocaine-prilocaine (EMLA) cream Apply 1 application topically as needed (for pain). Apply to port a cath site one hour prior to needle stick. (Patient not  taking: Reported on 02/28/2015) 30 g 2 Not Taking  . methadone (DOLOPHINE) 10 MG tablet Take 1 tablet (10 mg total) by mouth every 8 (eight) hours. 30 tablet 0   . omeprazole (PRILOSEC) 20 MG capsule Take 20 mg by mouth every morning.   Taking  . polyethylene glycol powder (GLYCOLAX/MIRALAX) powder Take 17 g by mouth 2 (two) times daily as needed. (Patient taking differently: Take 17 g by mouth every morning. ) 3350 g 3 Taking  . senna-docusate (SENOKOT-S) 8.6-50 MG per tablet Take 1 tablet by mouth 2 (two) times daily. (Patient not taking: Reported on 02/28/2015) 90 tablet 3 Not Taking  . ziprasidone (GEODON) 80 MG capsule Take 160 mg by mouth at bedtime.    Taking   Scheduled:  . heparin  5,000 Units Subcutaneous 3 times per day  . insulin regular  0-10 Units Intravenous TID WC  . lamoTRIgine  200 mg Oral Daily   . LORazepam      . sodium chloride  3 mL Intravenous Q12H  . ziprasidone  160 mg Oral QHS   Infusions:  . sodium chloride    . dextrose 5 % and 0.45% NaCl    . insulin (NOVOLIN-R) infusion 6.3 Units/hr (03/08/15 0715)  . sodium chloride 0.45 % 1,000 mL infusion 200 mL/hr at 03/08/15 0714    Assessment: 54yo female presents w/ AMS x3d after abusing drugs and not taking insulin, admitted for DKA, to begin IV ABX for SIRS; of note SCr was 1.1 a month ago, now 1.6.  Goal of Therapy:  Vancomycin trough level 15-20 mcg/ml  Plan:  Will begin vancomycin 750mg  IV Q24H and Zosyn 3.375g IV Q8H and monitor CBC, Cx, CrCl, levels prn.  Wynona Neat, PharmD, BCPS  03/08/2015,7:19 AM

## 2015-03-09 ENCOUNTER — Other Ambulatory Visit: Payer: Self-pay | Admitting: Hematology and Oncology

## 2015-03-09 ENCOUNTER — Ambulatory Visit: Payer: Self-pay

## 2015-03-09 ENCOUNTER — Telehealth: Payer: Self-pay | Admitting: *Deleted

## 2015-03-09 ENCOUNTER — Other Ambulatory Visit: Payer: Self-pay

## 2015-03-09 ENCOUNTER — Ambulatory Visit: Payer: Self-pay | Admitting: Hematology and Oncology

## 2015-03-09 DIAGNOSIS — Z66 Do not resuscitate: Secondary | ICD-10-CM

## 2015-03-09 LAB — GLUCOSE, CAPILLARY
GLUCOSE-CAPILLARY: 100 mg/dL — AB (ref 65–99)
Glucose-Capillary: 160 mg/dL — ABNORMAL HIGH (ref 65–99)
Glucose-Capillary: 181 mg/dL — ABNORMAL HIGH (ref 65–99)

## 2015-03-09 LAB — HEMOGLOBIN A1C
Hgb A1c MFr Bld: 7.8 % — ABNORMAL HIGH (ref 4.8–5.6)
MEAN PLASMA GLUCOSE: 177 mg/dL

## 2015-03-09 MED ORDER — LORAZEPAM 2 MG/ML IJ SOLN
INTRAMUSCULAR | Status: AC
  Administered 2015-03-09: 4 mg via INTRAVENOUS
  Filled 2015-03-09: qty 2

## 2015-03-09 MED ORDER — LORAZEPAM 2 MG/ML IJ SOLN
INTRAMUSCULAR | Status: AC
  Administered 2015-03-09: 4 mg via INTRAVENOUS
  Filled 2015-03-09: qty 5

## 2015-03-09 MED ORDER — LORAZEPAM 2 MG/ML IJ SOLN
1.0000 mg | INTRAMUSCULAR | Status: DC | PRN
  Administered 2015-03-09: 4 mg via INTRAVENOUS
  Filled 2015-03-09: qty 5

## 2015-03-09 MED ORDER — FENTANYL BOLUS VIA INFUSION
25.0000 ug | INTRAVENOUS | Status: DC | PRN
  Filled 2015-03-09: qty 100

## 2015-03-09 MED ORDER — LORAZEPAM BOLUS VIA INFUSION
2.0000 mg | INTRAVENOUS | Status: DC | PRN
  Filled 2015-03-09: qty 5

## 2015-03-09 MED ORDER — LORAZEPAM 2 MG/ML IJ SOLN
5.0000 mg/h | INTRAVENOUS | Status: DC
  Administered 2015-03-09: 4 mg/h via INTRAVENOUS
  Filled 2015-03-09: qty 25

## 2015-03-09 MED ORDER — LORAZEPAM 2 MG/ML IJ SOLN
1.0000 mg | INTRAMUSCULAR | Status: DC | PRN
  Administered 2015-03-09 (×2): 4 mg via INTRAVENOUS

## 2015-03-11 LAB — CULTURE, RESPIRATORY

## 2015-03-11 LAB — CULTURE, RESPIRATORY W GRAM STAIN

## 2015-03-12 ENCOUNTER — Telehealth: Payer: Self-pay

## 2015-03-12 NOTE — Discharge Summary (Signed)
Death Note: For complete accounting of the patient's history and physical on presentation please refer to admission H&P. In brief the patient is a 54 year old female with known history of diabetes mellitus type 1 and stage IV colon cancer status post right hemicolectomy. He also has a known history of substance abuse. Patient presented to hospital on 03/08/15) recurrence had not taken her insulin may couple of days prior to presentation. The patient was found to be in DKA. She had altered mentation and acute hypoxic respiratory failure that prompted transfer to the intensive care unit and endotracheal intubation. There was concern for possible aspiration pneumonia as well due to her altered mentation. The patient's diabetic ketoacidosis resolved and she transitioned to subcutaneous insulin.  Palliative care was consulted on admission to the intensive care unit and after further discussions with the patient's father time was given for family to travel and visit prior to transitioning to full comfort care with a terminal extubation on 03-11-2015. Patient was pronounced dead at 1601 hours on 11-Mar-2015 with her father at bedside.  Diagnoses at Death: 1. Acute hypoxic respiratory failure 2. Aspiration pneumonia 3. Acute hypoxic metabolic encephalopathy 4. Diabetic ketoacidosis 5. Stage IV colon cancer 6. Protein-calorie malnutrition 7. History of IV drug abuse 8. History of chronic pain on methadone 9. History of depression/bipolar disorder 10. History of tobacco use 11. History of hepatitis C 12. History of chronic kidney disease 11. History of hypertension

## 2015-03-12 NOTE — Telephone Encounter (Signed)
On 03/12/2015 I received a death certificate from [place] and [facility]. The death certificate is for cremation. The patient is a patient of [facility]. The death certificate will be taken to the ICU unit of [place] this am for signature. On 03-26-15 I received the death certificate back from [place]. I got the death certificate ready and mailed the original copy to the [facility] Dept per their request. I also called the funeral home to tell them this information.

## 2015-03-13 LAB — CULTURE, BLOOD (ROUTINE X 2): Culture: NO GROWTH

## 2015-04-05 NOTE — Progress Notes (Signed)
Pt. Terminally extubated at 1045. Pt. Expired at 1601. Absence of heart rate and apnea auscultated by two RN's for 2 full minutes. Father of the patient was at the bedside.   20cc's of ativan wasted in the sink, with RN X's 2, [place] 150cc's of fentanyl wasted in the sink, with RN X's 2, [place]

## 2015-04-05 NOTE — Procedures (Signed)
Extubation Procedure Note  Patient Details:   Name: Monica [name] DOB: May 19, 1961 MRN: [id number]   Airway Documentation:  Airway 7.5 mm (Active)  Secured at (cm) 24 cm 03/21/2015  9:00 AM  Measured From Teeth 03-21-15  9:00 AM  Optima 03/21/15  9:00 AM  Secured By [employer] 03-21-2015  9:00 AM  Tube Holder Repositioned Yes 2015-03-21  9:00 AM  Cuff Pressure (cm H2O) 24 cm H2O 03/08/2015  7:15 PM  Site Condition Dry 03-21-2015  9:00 AM    Evaluation  O2 sats: transiently fell during during procedure Complications: No apparent complications Patient did tolerate procedure well. Bilateral Breath Sounds: Rhonchi Suctioning: Airway No   Pt was extubate per end of life withdrawal orders. Pt sat's fell upon extubation. Pt was placed on a 2 LPM [place]. Pt's family, pallative care NP, and RN was at her bedside. RT will continue to monitor.  Renato Gails Magdaleno Lortie 03-21-15, 10:45 AM

## 2015-04-05 NOTE — Telephone Encounter (Signed)
"  Please give Dr. Alvy Bimler the message that [place] is in the [facility] on life support."  This nurse noticed room 2H07C-01 with DKA diagnosis.

## 2015-04-05 NOTE — Progress Notes (Signed)
Family Medicine Teaching Service Social Note  Patient name: Monica [name] record number: [id number] Date of birth: 1960/10/07 Age: 54 y.o. Gender: female  Primary Care Provider: Georges Lynch, MD Consultants: CCM, Palliative Care Code Status: DNR  Visited patient and met patient's father Monica [name] and family's pastor Monica [name]. Family is moving forward with plan for terminal vent wean and extubation in accordance with patient's known wishes. Patient's daughters and grandchild plan to be in attendance.  Rogue Bussing, MD 04/06/2015, 12:07 PM PGY-1, [place] Intern pager: [phone], text pages welcome

## 2015-04-05 NOTE — Progress Notes (Signed)
PULMONARY / CRITICAL CARE MEDICINE   Name: Monica [name] MRN: [id number] DOB: February 04, 1961    ADMISSION DATE:  03/25/2015 CONSULTATION DATE:  03/08/2015  REFERRING MD :  FPTS  CHIEF COMPLAINT:  Short of breath  INITIAL PRESENTATION:  54 yo female with stage IV colon cancer presented with DKA.  Transferred to ICU with hypoxic respiratory failure with concern for aspiration PNA.  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  STUDIES:   SIGNIFICANT EVENTS: 8/03 Admit  8/04 VDRF, palliative care consulted, limited resuscitation >> no CPR, no defibrillation 8/04 Intubated  SUBJECTIVE:  Patient was intubated on 8/4. Palliative care was consulted with given patient's metastatic colon cancer and dismal prognosis. Father has agreed to terminal extubation and comfort care measures at 10 AM this morning allowing family to visit. No acute events overnight.  ROS:  Unobtainable as the patient is intubated and sedated.  VITAL SIGNS: Temp:  [97 F (36.1 C)-98.8 F (37.1 C)] 98.3 F (36.8 C) (08/05 0800) Pulse Rate:  [88-104] 103 (08/05 0900) Resp:  [22-33] 30 (08/05 0900) BP: (91-135)/(65-86) 135/79 mmHg (08/05 0900) SpO2:  [93 %-100 %] 100 % (08/05 0900) Arterial Line BP: (90-120)/(60-74) 111/69 mmHg (08/05 0900) FiO2 (%):  [80 %] 80 % (08/05 0900) HEMODYNAMICS:   VENTILATOR SETTINGS: Vent Mode:  [-] PCV FiO2 (%):  [80 %] 80 % Set Rate:  [30 bmp-32 bmp] 30 bmp PEEP:  [10 cmH20] 10 cmH20 Plateau Pressure:  [26 cmH20-33 cmH20] 33 cmH20 INTAKE / OUTPUT:  Intake/Output Summary (Last 24 hours) at 04/04/15 0948 Last data filed at 2015/04/04 0911  Gross per 24 hour  Intake 4191.67 ml  Output   1840 ml  Net 2351.67 ml    PHYSICAL EXAMINATION: General:  Sedated. Remains on ventilator. No distress HEENT:  No scleral injection or icterus. Endotracheal tube in place.  Cardiovascular:  Regular rate. No edema. No  appreciable JVD.  Pulmonary:  Good aeration & clear to auscultation bilaterally. Symmetric chest wall rise. Continuing on ventilator.   Abdomen: Soft. Normal bowel sounds. Nondistended.  Neurological:  Sedated. Moving all 4 extremities. Not following commands.   LABS:  CBC  Recent Labs Lab 03/08/15 0020  WBC 19.5*  HGB 11.5*  HCT 35.7*  PLT 410*   Coag's No results for input(s): APTT, INR in the last 168 hours.   BMET  Recent Labs Lab 03/08/15 0835 03/08/15 1055 03/08/15 1800  NA 142 143 138  K 3.5 3.8 3.3*  CL 112* 116* 112*  CO2 17* 20* 17*  BUN 34* 34* 30*  CREATININE 1.46* 1.22* 1.24*  GLUCOSE 245* 98 124*   Electrolytes  Recent Labs Lab 03/08/15 0835 03/08/15 1055 03/08/15 1800  CALCIUM 9.0 8.4* 8.4*   Sepsis Markers  Recent Labs Lab 03/08/15 0051  LATICACIDVEN 1.91   ABG  Recent Labs Lab 03/08/15 1115 03/08/15 1320  PHART 7.201* 7.237*  PCO2ART 52.6* 47.5*  PO2ART 98.0 67.7*     Liver Enzymes  Recent Labs Lab 03/08/15 0020  AST 22  ALT 22  ALKPHOS 206*  BILITOT 2.2*  ALBUMIN 3.1*   Cardiac Enzymes No results for input(s): TROPONINI, PROBNP in the last 168 hours.   Glucose  Recent Labs Lab 03/08/15 1845 03/08/15 2004 03/08/15 2107 03/08/15 2156 04-04-2015 0020 April 04, 2015 0434  GLUCAP 104* 109* 126* 165* 160* 181*    Imaging Dg Abd 1 View  03/08/2015   CLINICAL DATA:  NG tube placement  EXAM: ABDOMEN - 1 VIEW  COMPARISON:  None.  FINDINGS: The bowel gas pattern is normal. There is left ureteral stent in place. NG tube coiled within stomach with tip in proximal stomach. Postcholecystectomy surgical clips are noted.  IMPRESSION: Left ureteral stent in place. NG tube coiled within stomach with tip in proximal stomach just below GE junction.   Electronically Signed   By: Lahoma Crocker M.D.   On: 03/08/2015 10:33   Dg Chest Port 1 View  03/08/2015   CLINICAL DATA:  Hypoxia ; central catheter placement. Colon carcinoma.  EXAM:  PORTABLE CHEST - 1 VIEW  COMPARISON:  Study obtained earlier in the day  FINDINGS: Endotracheal tube tip is 2.7 cm above the carina. Right jugular catheter tip is in the superior cava. Port-A-Cath tip is in the superior cava. Nasogastric tube tip and side port are in the stomach. No pneumothorax. Widespread metastatic disease is present with multiple nodular opacities throughout the lungs diffusely. There is new patchy airspace opacity in the right upper lobe. Heart size and pulmonary vascularity are within normal limits. There is right paratracheal and left paratracheal region adenopathy. No bone lesions apparent.  IMPRESSION: Tube and catheter positions as described without pneumothorax. New patchy airspace consolidation right upper lobe, concerning for pneumonia. Widespread metastatic disease.   Electronically Signed   By: Lowella Grip III M.D.   On: 03/08/2015 10:33     ASSESSMENT / PLAN:  PULMONARY ETT 8/04 >> A: Acute respiratory failure with concern for aspiration. Tobacco abuse.  P:   Plan for terminal vent wean & extubation with full comfort care at [place] 8/04 >> Port >> A:  Hypovolemia, sinus tachycardia in setting of DKA. Hx of HTN.  P:  Continue IV fluids for now   RENAL A:   Hypokalemia. AKI. Metabolic acidosis.  P:   Allowing Foley to remain for patient's comfort   GASTROINTESTINAL A:   Protein calorie malnutrition.  P:   NPO Discontinuing Protonix in anticipation of full comfort care at 10 AM  HEMATOLOGIC A:   Stage IV colon cancer with diffuse mets to lung >> on palliative chemo as outpt. Leukocytosis.  P:  Discontinuing Lovenox in anticipation of full comfort care at 10 AM  INFECTIOUS A:   Possible aspiration PNA.  P:   Discontinuing vancomycin & Zosyn in anticipation of full comfort care at 10 AM.  Day 2 vancomycin, zosyn  Sputum 8/04 >> Blood 8/04 >>  ENDOCRINE A:   DKA - resolved  P:   Switched  to Lantus  daily at bedtime Discontinuing NovoLog sliding scale algorithm at this time in anticipation of full comfort care  NEUROLOGIC A:   Acute metabolic encephalopathy. Hx of IVDA, depression, bipolar, chronic pain.  P:   RASS goal: 0 to -1 Starting Ativan IV when necessary Continuing fentanyl drip for pain & relief of dyspnea  Goals of Care - palliative care consult. Plan for terminal vent wean and extubation this morning at 10 AM per their note and discussion with father this morning. He reports that the patient would not want any of this.  I have spent a total of 40 minutes of critical care time today caring for this patient, reviewing the patient's electronic medical record, and discussing the plan of care this morning with the patient's father and pastor at bedside.  Sonia Baller Ashok Cordia, M.D. [facility] Pulmonary & Critical Care Pager:  [phone] After 3pm or if no response, call [phone]   03/25/15, 9:48  AM     

## 2015-04-05 NOTE — Progress Notes (Signed)
PCCM Attending Note: I was notified by palliative care the patient's family is prepared and ready for terminal vent wean and extubation. Withdrawal of care order set placed.   Sonia Baller Ashok Cordia, M.D. [place] [facility]monary & Critical Care Pager:  [phone] After 3pm or if no response, call [phone]

## 2015-04-05 NOTE — Progress Notes (Signed)
   03/31/2015 1200  Clinical Encounter Type  Visited With Family;Health care provider  Visit Type Follow-up;Patient actively dying  Referral From Nurse  Consult/Referral To Chaplain  Spiritual Encounters  Spiritual Needs Emotional;Grief support  Stress Factors  Family Stress Factors Loss;Major life changes  [facility] consult; [place] met with father, step-mom and 53 yr old daughter; offered grief, emotional and social support; referral for grief support and additional follow-up support as needed in regard to pt daughters; [place] on standby for final prayers;

## 2015-04-05 NOTE — Progress Notes (Signed)
Daily Progress Note   Patient Name: Monica [name]       Date: 2015-03-15 DOB: 08-05-1960  Age: 54 y.o. MRN#: [id number] Attending Physician: [name], MD Primary Care Physician: Georges Lynch, MD Admit Date: 03/06/2015  Reason for Consultation/Follow-up: Psychosocial/spiritual support, Terminal care and Withdrawal of life-sustaining treatment  Subjective:     -continued conversation with family regarding decision for terminal wean this morning.  All understand the limited prognosis of hours to days once extubated.  -discussed with Dr Ashok Cordia, terminal wean orders placed  -continued support and education to family  regarding natural  trajectory and expections for this patient   -emotional support for family   Length of Stay: 1 day  Current Medications: Scheduled Meds:  . antiseptic oral rinse  7 mL Mouth Rinse QID  . chlorhexidine  15 mL Mouth Rinse BID  . insulin glargine  25 Units Subcutaneous QHS  . sodium chloride  3 mL Intravenous Q12H    Continuous Infusions: . dextrose 5 % and 0.45% NaCl 125 mL/hr at 03/15/2015 0900  . fentaNYL infusion INTRAVENOUS 150 mcg/hr (March 15, 2015 0900)  . propofol (DIPRIVAN) infusion 40 mcg/kg/min (2015-03-15 0911)  . sodium chloride 0.45 % 1,000 mL infusion Stopped (03/08/15 1304)    PRN Meds: fentaNYL, LORazepam  Palliative Performance Scale: 10%     Vital Signs: BP 135/79 mmHg  Pulse 103  Temp(Src) 98.3 F (36.8 C) (Axillary)  Resp 30  Ht 5\' 5"  (1.651 m)  Wt 57.9 kg (127 lb 10.3 oz)  BMI 21.24 kg/m2  SpO2 100%  LMP 12/05/2014 SpO2: SpO2: 100 % O2 Device: O2 Device: Ventilator O2 Flow Rate: O2 Flow Rate (L/min): 4 L/min  Intake/output summary:  Intake/Output Summary (Last 24 hours) at 03-15-15 1004 Last data filed at 03/15/2015 0911  Gross per 24 hour  Intake 3991.67 ml  Output   1840 ml  Net 2151.67 ml   LBM:   Baseline Weight: Weight: 57.9 kg (127 lb 10.3 oz) Most recent weight: Weight: 57.9 kg (127 lb 10.3  oz)  Physical Exam:   Post extubation:       General: sedated on fentanyl and ativan boluses  CVS: tachycardic Resp: tachypnea  Skin: feet/hands cool to touch   Additional Data Reviewed: Recent Labs     03/08/15  0020   03/08/15  1055  03/08/15  1800  WBC  19.5*   --    --    --   HGB  11.5*   --    --    --   PLT  410*   --    --    --   NA  136   < >  143  138  BUN  36*   < >  34*  30*  CREATININE  1.62*   < >  1.22*  1.24*   < > = values in this interval not displayed.     Problem List:  Patient Active Problem List   Diagnosis Date Noted  . DKA (diabetic ketoacidoses) 03/08/2015  . Palliative care encounter 03/08/2015  . Confusion   . Metastatic cancer   . Acute respiratory failure with hypoxia   . Diabetic ketoacidosis without coma associated with type 1 diabetes mellitus   . Cancer associated pain 02/28/2015  . Double vision 02/26/2015  . Bilateral hydronephrosis 01/29/2015  . Bereavement due to life event 12/11/2014  . Chronic pain disorder 09/08/2014  . Lung nodule seen on imaging study 07/17/2014  . Other constipation  04/20/2014  . Metastasis to liver 01/18/2014  . Hypoglycemia 01/11/2014  . Neuropathy due to secondary diabetes 10/12/2013  . Leukopenia due to antineoplastic chemotherapy 09/28/2013  . Thrombocytopenia due to drugs 09/28/2013  . Dysmenorrhea 09/05/2013  . Colon cancer 07/22/2013  . Colonic mass 07/16/2013  . Constipation 05/26/2013  . Severe diabetic hypoglycemia 12/17/2012  . Polypharmacy 12/17/2012  . 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 Bilateral knee pain 09/09/2012  . Opioid dependence 09/21/2011  . Benzodiazepine abuse 09/21/2011  . PERIMENOPAUSAL STATUS 10/11/2010  . HYPERLIPIDEMIA 08/20/2010  . LYMPHADENOPATHY 08/20/2010  . Overweight (BMI 25.0-29.9) 06/22/2009  . CKD (chronic kidney disease), stage III 03/30/2009  . Edema 08/15/2008  . Diabetes mellitus type I 04/07/2008  . 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 HEPATITIS C 04/04/2008   . ANEMIA, CHRONIC 04/04/2008  . Bipolar disorder 04/04/2008  . TOBACCO ABUSE 04/04/2008  . Polysubstance abuse 04/04/2008  . HYPERTENSION, BENIGN ESSENTIAL 04/04/2008     Palliative Care Assessment & Plan    Code Status:  DNR  Goals of Care:  Comfort, quality and dignity  Desire for further Chaplaincy support: community pastor present at bedside    Prognosis: Hours - Days  5. Discharge Planning: Expect hospital death   Care plan was discussed with Dr Ashok Cordia  Thank you for allowing the Palliative Medicine Team to assist in the care of this patient.   Time In: 1000 Time Out: 1100 Total Time 60 min Prolonged Time Billed  no     Greater than 50%  of this time was spent counseling and coordinating care related to the above assessment and plan.     Knox Royalty, NP  03-16-2015, 10:04 AM  Please contact Palliative Medicine Team phone at [phone] for questions and concerns.

## 2015-04-05 DEATH — deceased

## 2016-07-13 IMAGING — CR DG ABDOMEN 2V
2 series · 2 of 2 positions shown · non-contrast
Comparison: CT abdomen 02/20/2014

CLINICAL DATA: Lower abdominal pain with constipation

EXAM:
ABDOMEN - 2 VIEW

[w abdomen upright *]
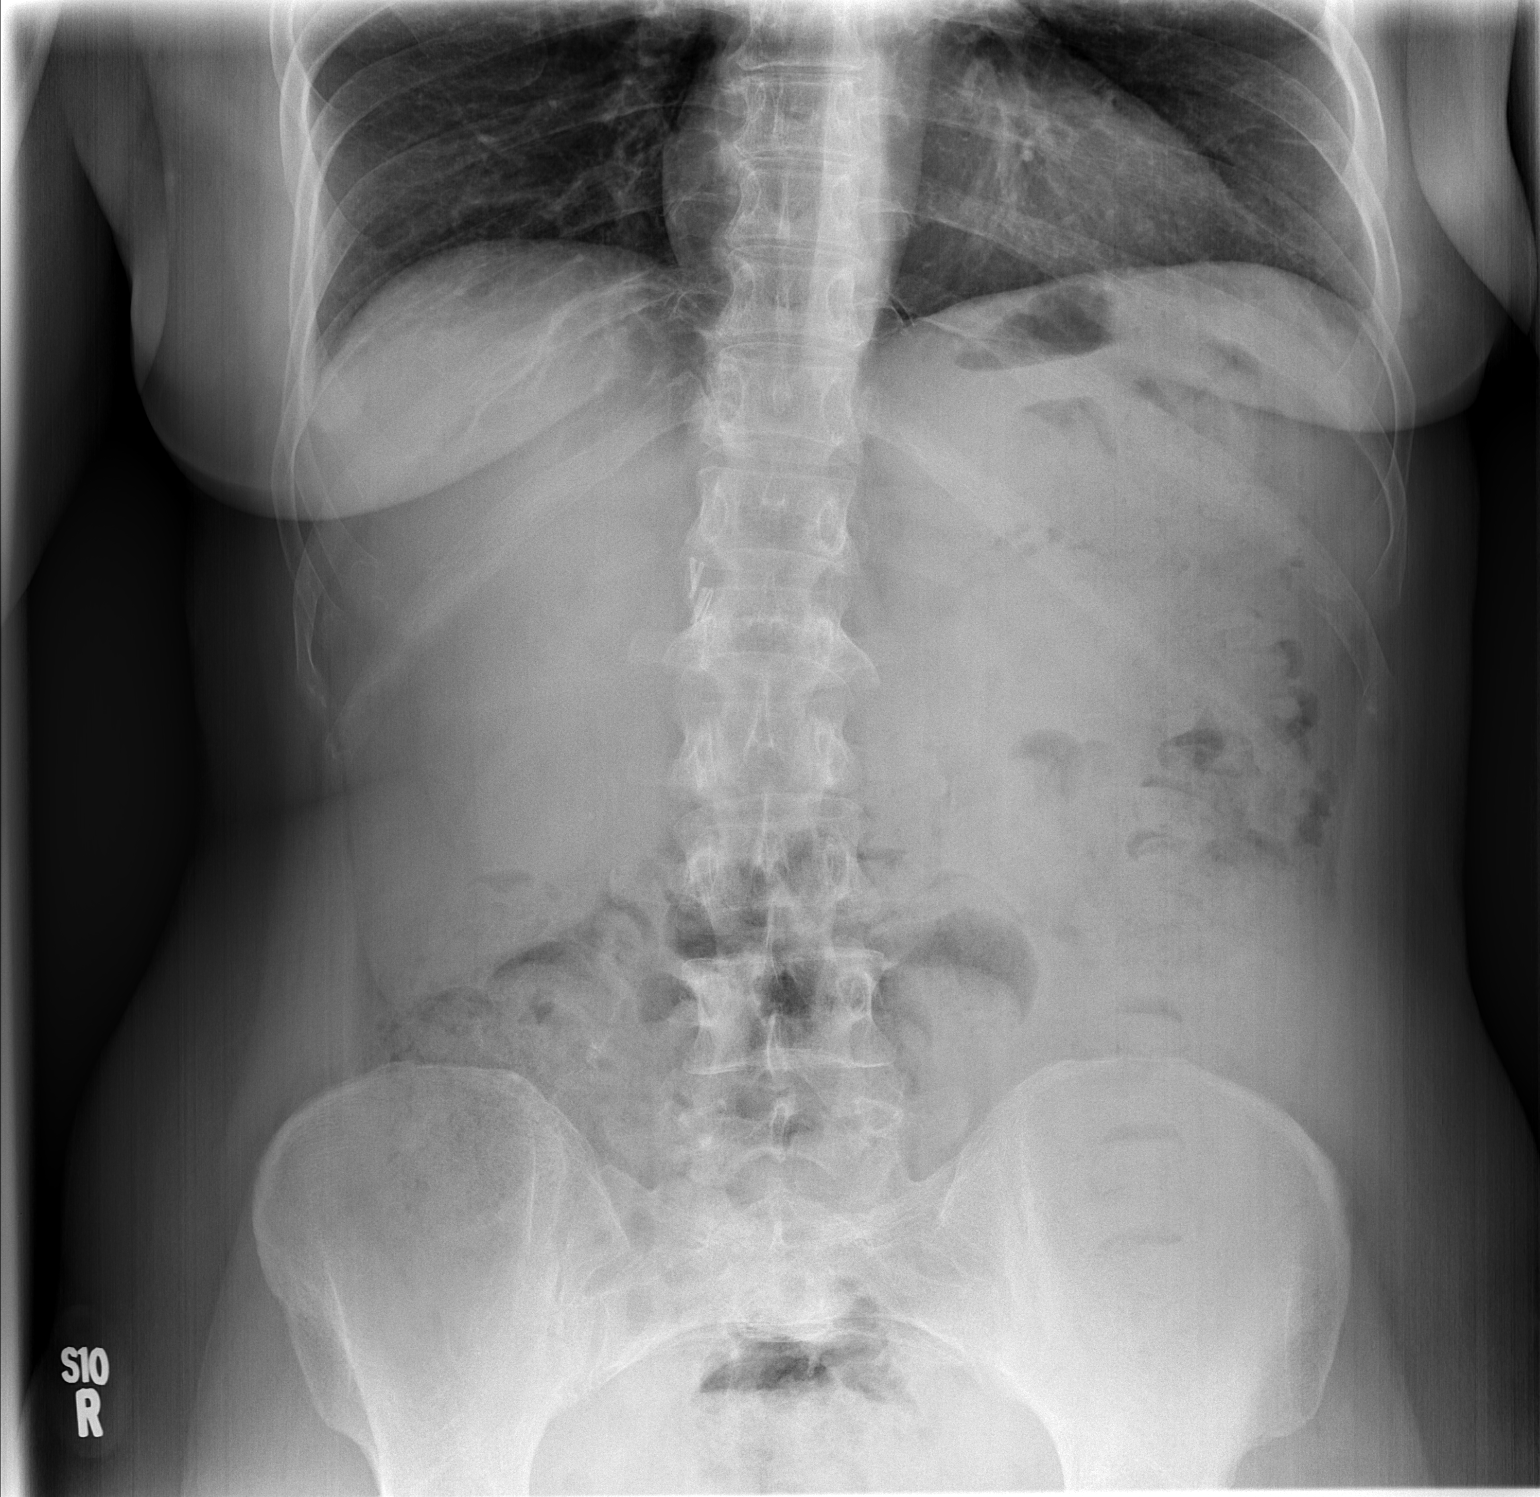

[t abdomen supine]
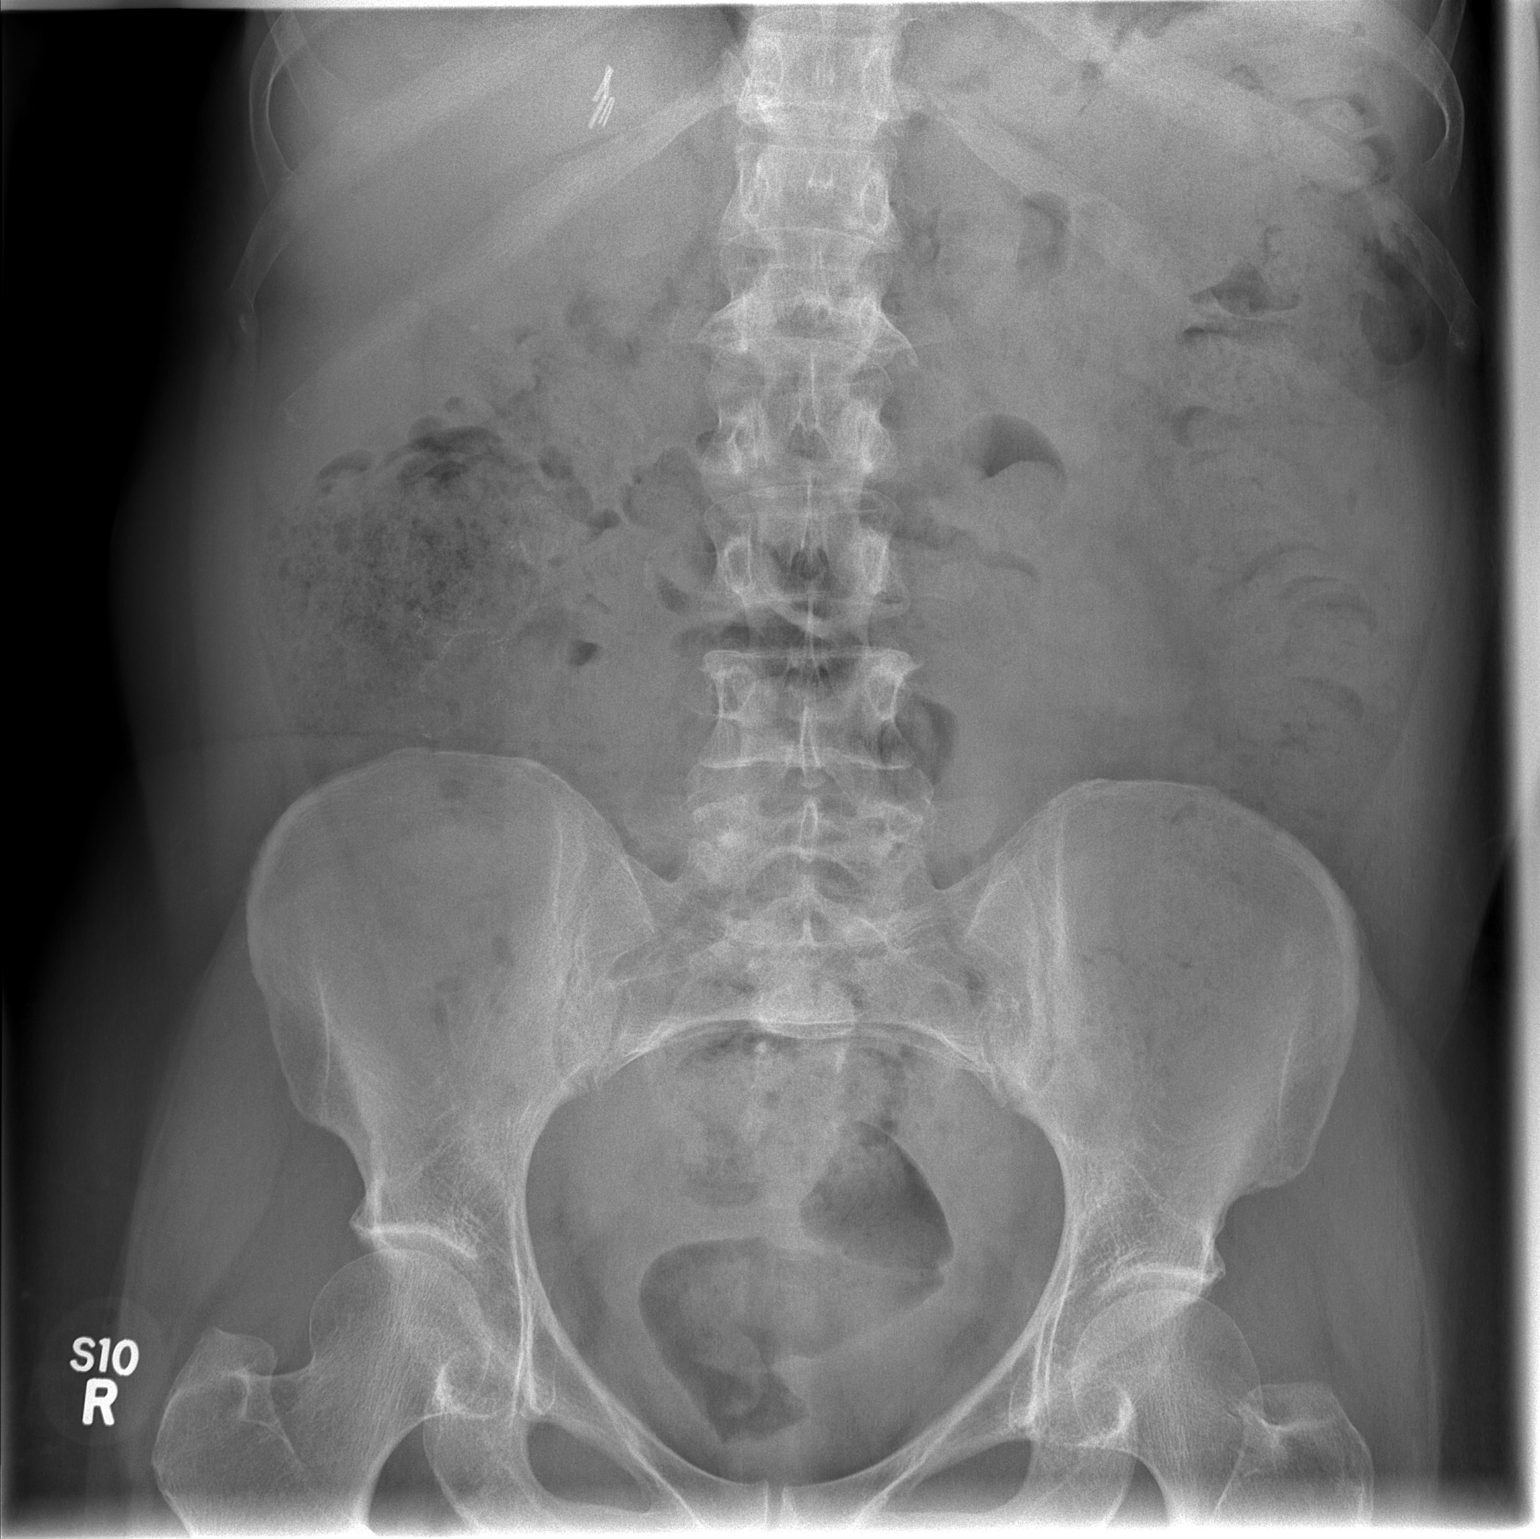

[2 of 2 positions shown; findings below may reference images not displayed]

FINDINGS: The bowel gas pattern is normal. Moderate amount of stool throughout
the colon. There is no evidence of free air. No radio-opaque calculi
or other significant radiographic abnormality is seen.
IMPRESSION: Moderate amount of stool throughout the colon.

## 2017-04-20 IMAGING — CT CT ABD-PELV W/ CM
2 of 5 series · 13 of 46 positions shown, 15 images · IV contrast (OMNIPAQUE)
Comparison: CT of the chest, abdomen and pelvis 11/30/2014.

CLINICAL DATA: 53-year-old female with history of colon cancer
treated with chemotherapy. Metastatic disease to the liver.
Additional history of hepatitis-C.

EXAM:
CT CHEST, ABDOMEN, AND PELVIS WITH CONTRAST
TECHNIQUE: Multidetector CT imaging of the chest, abdomen and pelvis was
performed following the standard protocol during bolus
administration of intravenous contrast.
CONTRAST:  100mL OMNIPAQUE IOHEXOL 300 MG/ML  SOLN

[Series 2: cap with st · axial · 0.86mm/px · z∈[-572,-32]mm · 10 of 122 slices shown, 12 images]
[im 7/122  soft-tissue]
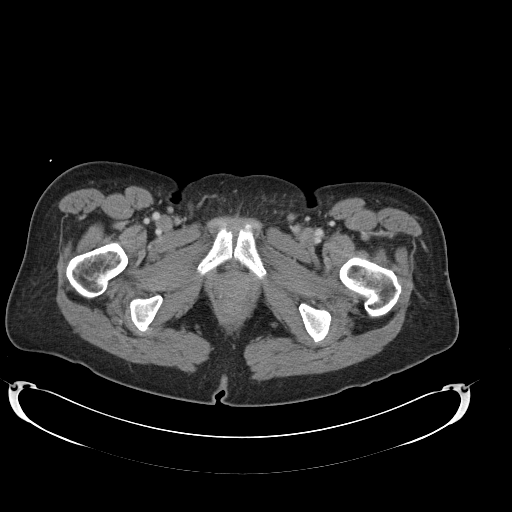
[im 7/122  bone]
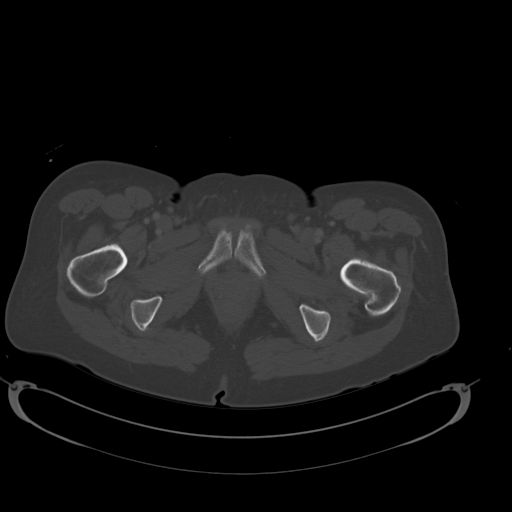
[im 21/122  soft-tissue]
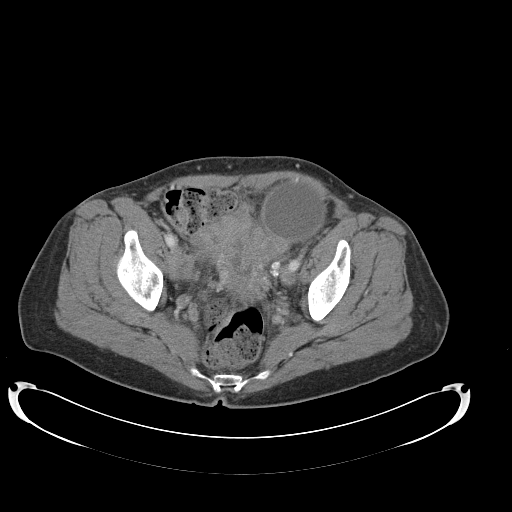
[im 34/122  soft-tissue]
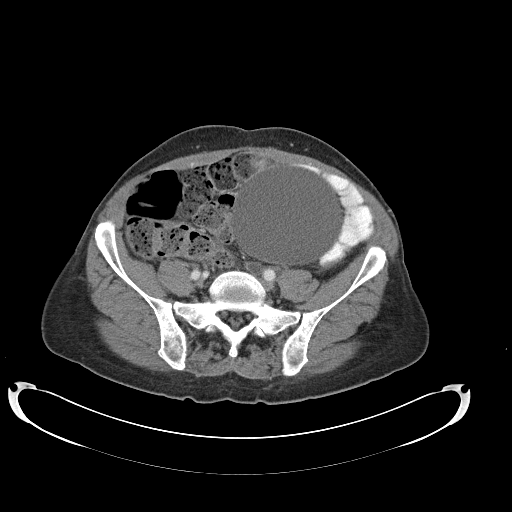
[im 41/122  soft-tissue]
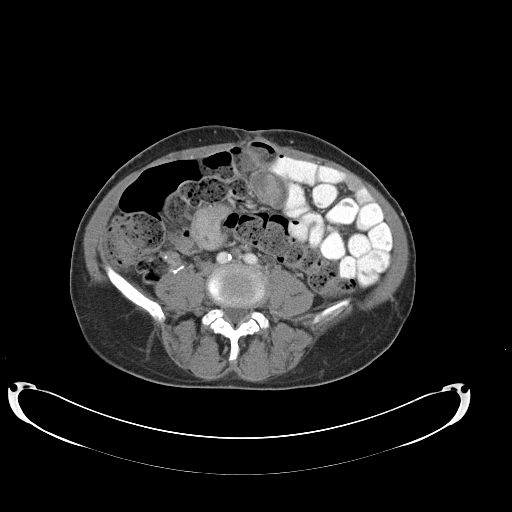
[im 54/122  soft-tissue]
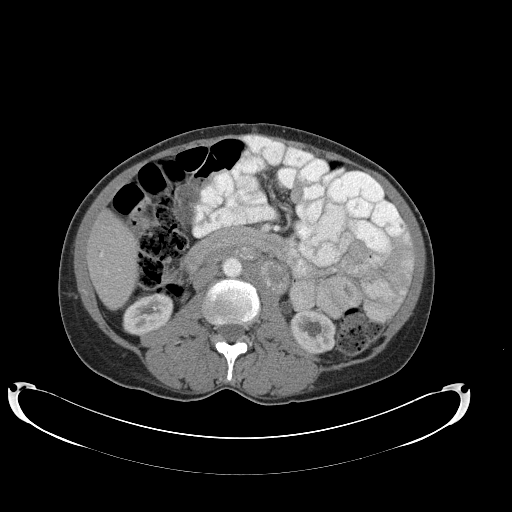
[im 68/122  soft-tissue]
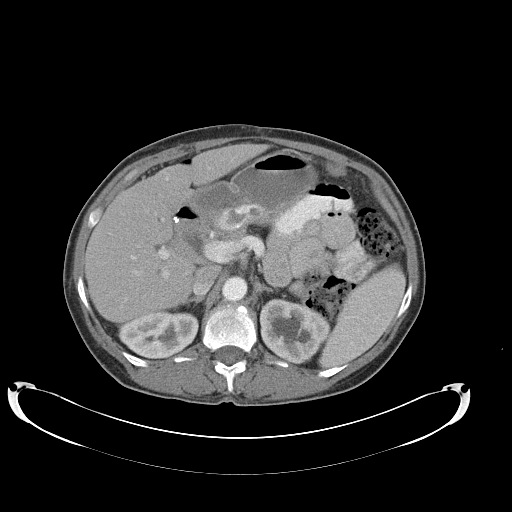
[im 81/122  soft-tissue]
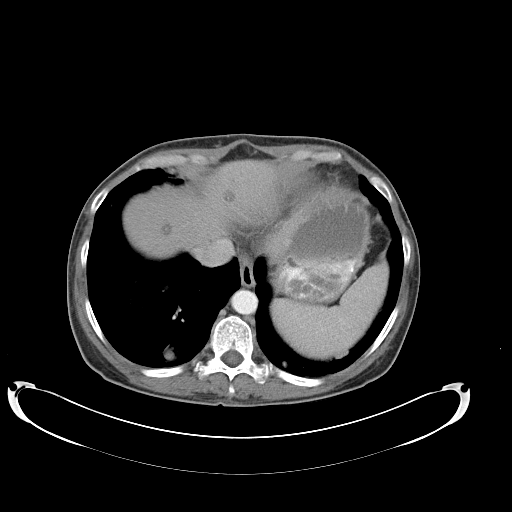
[im 88/122  soft-tissue]
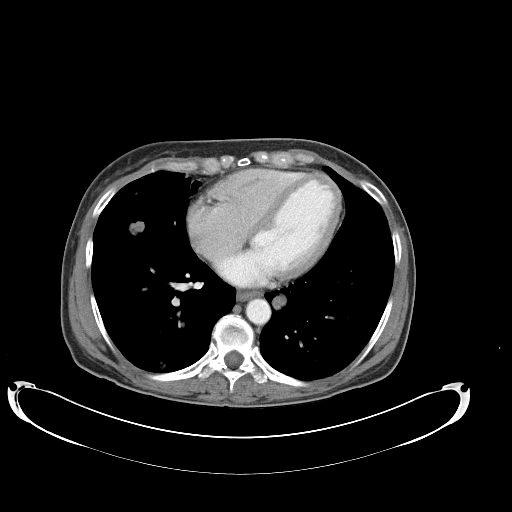
[im 101/122  soft-tissue]
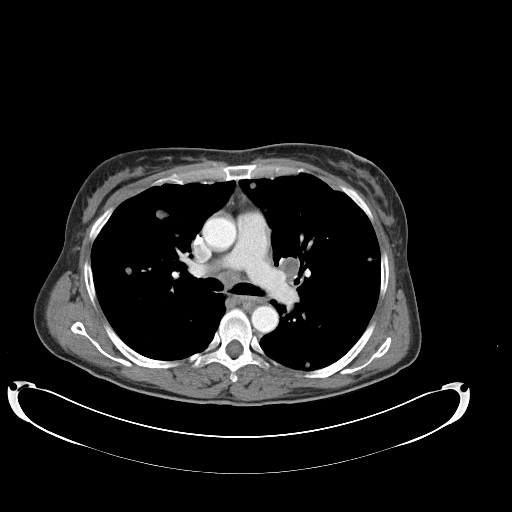
[im 101/122  bone]
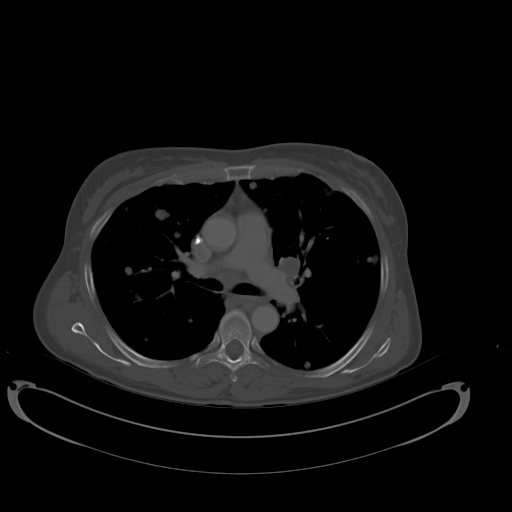
[im 115/122  soft-tissue]
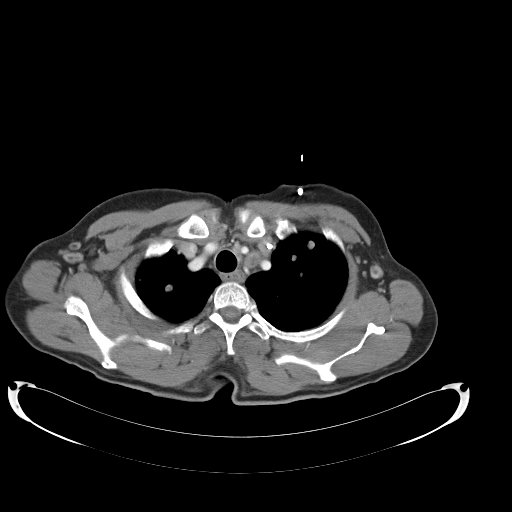

[Series 603: <mpr thick range(1)> · coronal · 1.19mm/px · 3 of 90 slices shown]
[im 30/90  soft-tissue]
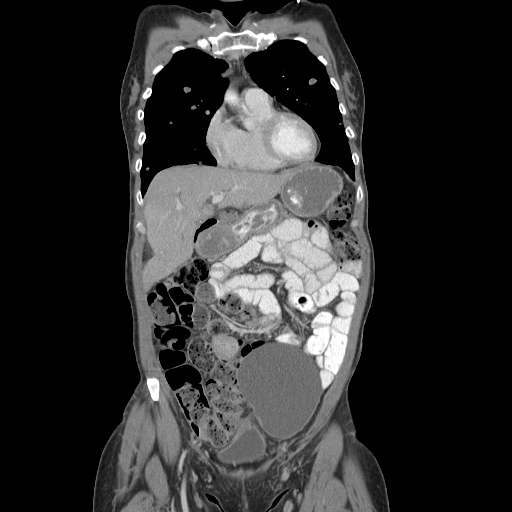
[im 40/90  soft-tissue]
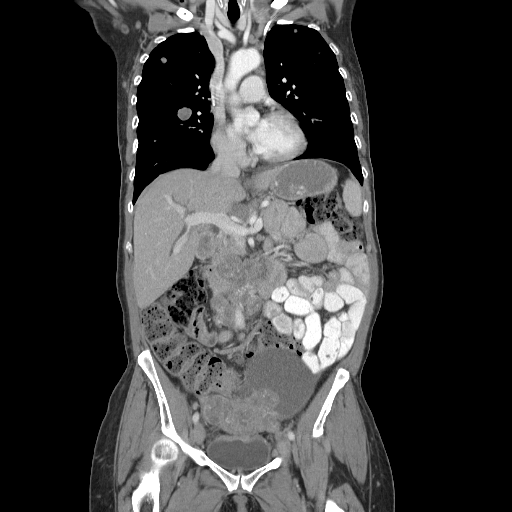
[im 50/90  soft-tissue]
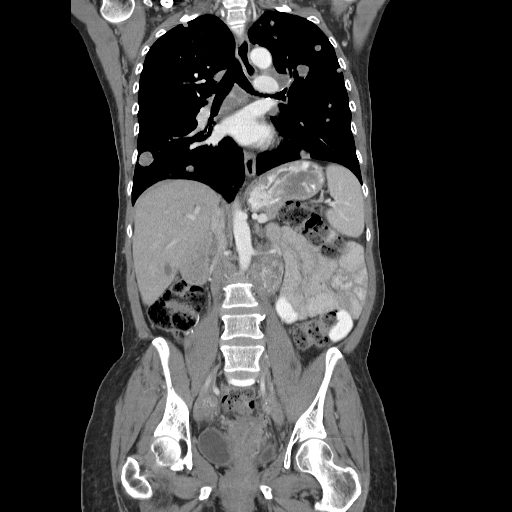

[13 of 46 positions shown; findings below may reference images not displayed]

FINDINGS: CT CHEST FINDINGS

Mediastinum/Lymph Nodes: Significant worsening mediastinal and
bilateral hilar lymphadenopathy compared to the prior study, with
the largest lymph nodes including 17 mm subcarinal lymph node, 15 mm
right hilar lymph node, 17 mm left hilar lymph node, 19 mm AP window
lymph node, and 13 mm anterior mediastinal lymph node. In addition,
there is bulky lymphadenopathy at the level of the thoracic inlet on
the left, with the largest lymph node posterior to the left common
carotid artery measuring 21 mm in short axis (previously 16). Left
supraclavicular lymphadenopathy is incompletely imaged to Heart size
is normal. There is no significant pericardial fluid, thickening or
pericardial calcification. Esophagus is unremarkable in appearance.
No axillary lymphadenopathy. Left-sided subclavian single-lumen
porta cath with tip terminating in the distal superior vena cava.

Lungs/Pleura: Interval increase in number and size of what are now
innumerable pulmonary nodules scattered throughout the lungs
bilaterally, compatible with significant progression of metastatic
disease. Specific examples include a 2.2 x 1.9 cm right upper lobe
nodule (image 30 of series 4 (previously 1.5 x 1.3 cm), a 1.8 x
cm right lower lobe nodule (image 39 of series 4), previously 1.4 x
1.5 cm, and a 1.9 x 1.6 cm left upper lobe nodule (image 30 of
series 4), previously only 11 x 12 mm. Numerous new pulmonary
nodules are also noted. No acute consolidative airspace disease. No
pleural effusions. Retained secretions or possible endobronchial
lesion along the posterolateral wall of the trachea on the right
side (image 13 of series 4).

Musculoskeletal/Soft Tissues: There are no aggressive appearing
lytic or blastic lesions noted in the visualized portions of the
skeleton.

CT ABDOMEN AND PELVIS FINDINGS

Hepatobiliary: Previously described hepatic metastases have
enlarged, with specific examples including a 2.1 x 1.2 cm lesion in
the left lobe at the junctions of segments 2 and 3 (image 49 of
series 2), previously 9 mm, as well as a 2.0 x 3.1 cm lesion in the
central aspect of segments 6 and 7 (image 51 of series 2), which
previously measured 1.7 x 2.2 cm. Several other smaller lesions also
appear slightly increased in size. No new hepatic lesions are
confidently identified. Status post cholecystectomy. Minimal
intrahepatic biliary ductal dilatation is unchanged, and the common
bile duct remains mildly dilated at 8 mm in the porta hepatis
(unchanged); these findings likely reflect chronic post
cholecystectomy physiology.

Pancreas: No definite pancreatic mass. No pancreatic ductal
dilatation. No pancreatic or peripancreatic fluid or inflammatory
changes.

Spleen: Spleen is borderline enlarged measuring 14.4 x 4.1 x 10.5 cm
(estimated splenic volume of 310 mL).

Adrenals/Urinary Tract: Bilateral adrenal glands and the right
kidney are normal in appearance. Interval worsening of what is now a
moderate left-sided hydroureteronephrosis, related to worsening
obstruction of the distal left ureter secondary to the previously
described pelvic mass (see below). Urinary bladder is normal in
appearance.

Stomach/Bowel: The appearance of the stomach is normal. No
pathologic dilatation of small bowel or colon. Status post ileocecal
resection. Some focal soft tissue prominence is noted adjacent to
the suture line, measuring approximately 1.7 x 2.2 cm (image 83 of
series 2), which could indicate some local recurrence of disease, or
could simply represent evolving chronic scar tissue.

Vascular/Lymphatic: Extensive bulky retroperitoneal lymphadenopathy
is very similar to the prior examination, measuring up to 4.2 x
cm in cross-section immediately beneath the level of the renal hila
(image 65 of series 2). Upper abdominal lymphadenopathy has
increased significantly, most notable for a very large node or
conglomeration of lymph nodes in the hepatoduodenal ligament
superiorly (image 52 of series 2), which currently measures 5.7 x
2.8 cm. Multiple borderline enlarged and mildly enlarged pelvic
lymph nodes are also noted bilaterally measuring up to 11 mm in the
left external iliac nodal station. Right-sided iliac lymphadenopathy
is also suspected, however, this is inseparable from the adjacent
pelvic mass and therefore difficult to measure.

Reproductive: Complex heterogeneously enhancing mass associated with
the uterus and right adnexa is similar in size measuring 7.0 x
cm (image 101 of series 2). Large 9.0 x 10.1 cm left adnexal lesion
has slightly increased in size, previously measuring 7.9 x 9.2 cm,
and again demonstrates some mural thickening and nodularity.

Other: Trace ascites.  No pneumoperitoneum.

Musculoskeletal: There are no aggressive appearing lytic or blastic
lesions noted in the visualized portions of the skeleton.
IMPRESSION: 1. Today's study demonstrates significant progression of widespread
metastatic disease to the lungs, liver, and lymph nodes in the
chest, abdomen and pelvis, as detailed above.
2. Slight interval enlargement of the complex cystic mass in the
left adnexa. Complex uterine/right adnexal mass is very similar to
the prior study, as detailed above, and contiguous with
lymphadenopathy along the right pelvic sidewall.
3. Progressively worsening now moderate left hydroureteronephrosis
related to obstruction from the pelvic mass.
4. Soft tissue prominence adjacent to the suture line at site of
ileocecal resection, slightly more prominent than cereal prior
examinations, concerning for local recurrence of disease.
5. Additional incidental findings, as above.
